# Patient Record
Sex: Female | Born: 1950 | Race: Black or African American | Hispanic: No | Smoking: Current every day smoker
Health system: Southern US, Community
[De-identification: ages and names within clinical notes are randomized; demographics above are authoritative.]

## PROBLEM LIST (undated history)

## (undated) DIAGNOSIS — I10 Essential (primary) hypertension: Secondary | ICD-10-CM

## (undated) DIAGNOSIS — M545 Low back pain, unspecified: Secondary | ICD-10-CM

## (undated) DIAGNOSIS — E785 Hyperlipidemia, unspecified: Secondary | ICD-10-CM

## (undated) DIAGNOSIS — R001 Bradycardia, unspecified: Secondary | ICD-10-CM

## (undated) DIAGNOSIS — C801 Malignant (primary) neoplasm, unspecified: Secondary | ICD-10-CM

## (undated) DIAGNOSIS — G8929 Other chronic pain: Secondary | ICD-10-CM

## (undated) DIAGNOSIS — K219 Gastro-esophageal reflux disease without esophagitis: Secondary | ICD-10-CM

## (undated) DIAGNOSIS — Z87440 Personal history of urinary (tract) infections: Secondary | ICD-10-CM

## (undated) DIAGNOSIS — M1990 Unspecified osteoarthritis, unspecified site: Secondary | ICD-10-CM

## (undated) DIAGNOSIS — E119 Type 2 diabetes mellitus without complications: Secondary | ICD-10-CM

## (undated) DIAGNOSIS — J449 Chronic obstructive pulmonary disease, unspecified: Secondary | ICD-10-CM

## (undated) DIAGNOSIS — R32 Unspecified urinary incontinence: Secondary | ICD-10-CM

## (undated) DIAGNOSIS — K759 Inflammatory liver disease, unspecified: Secondary | ICD-10-CM

## (undated) DIAGNOSIS — D649 Anemia, unspecified: Secondary | ICD-10-CM

## (undated) DIAGNOSIS — B019 Varicella without complication: Secondary | ICD-10-CM

## (undated) HISTORY — PX: TONSILLECTOMY: SUR1361

## (undated) HISTORY — DX: Chronic obstructive pulmonary disease, unspecified: J44.9

## (undated) HISTORY — DX: Personal history of urinary (tract) infections: Z87.440

## (undated) HISTORY — DX: Gastro-esophageal reflux disease without esophagitis: K21.9

## (undated) HISTORY — PX: APPENDECTOMY: SHX54

## (undated) HISTORY — DX: Anemia, unspecified: D64.9

## (undated) HISTORY — DX: Unspecified urinary incontinence: R32

## (undated) HISTORY — DX: Varicella without complication: B01.9

## (undated) HISTORY — DX: Type 2 diabetes mellitus without complications: E11.9

---
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
## 1982-04-20 HISTORY — PX: ABDOMINAL HYSTERECTOMY: SHX81

## 1994-04-20 HISTORY — PX: BREAST LUMPECTOMY: SHX2

## 1994-04-20 HISTORY — PX: BREAST BIOPSY: SHX20

## 1994-04-20 HISTORY — PX: MASTECTOMY: SHX3

## 2000-07-07 ENCOUNTER — Emergency Department (HOSPITAL_COMMUNITY): Admission: EM | Admit: 2000-07-07 | Discharge: 2000-07-07 | Payer: Self-pay | Admitting: Emergency Medicine

## 2001-05-19 ENCOUNTER — Emergency Department (HOSPITAL_COMMUNITY): Admission: EM | Admit: 2001-05-19 | Discharge: 2001-05-19 | Payer: Self-pay | Admitting: Emergency Medicine

## 2001-05-19 ENCOUNTER — Encounter: Payer: Self-pay | Admitting: Emergency Medicine

## 2002-06-25 ENCOUNTER — Encounter: Payer: Self-pay | Admitting: Emergency Medicine

## 2002-06-25 ENCOUNTER — Emergency Department (HOSPITAL_COMMUNITY): Admission: EM | Admit: 2002-06-25 | Discharge: 2002-06-26 | Payer: Self-pay | Admitting: *Deleted

## 2003-08-03 ENCOUNTER — Emergency Department (HOSPITAL_COMMUNITY): Admission: EM | Admit: 2003-08-03 | Discharge: 2003-08-04 | Payer: Self-pay

## 2005-12-08 ENCOUNTER — Emergency Department (HOSPITAL_COMMUNITY): Admission: EM | Admit: 2005-12-08 | Discharge: 2005-12-08 | Payer: Self-pay | Admitting: Emergency Medicine

## 2006-05-30 ENCOUNTER — Emergency Department (HOSPITAL_COMMUNITY): Admission: EM | Admit: 2006-05-30 | Discharge: 2006-05-30 | Payer: Self-pay | Admitting: Family Medicine

## 2008-02-23 ENCOUNTER — Ambulatory Visit: Payer: Self-pay | Admitting: Cardiology

## 2008-02-23 ENCOUNTER — Observation Stay (HOSPITAL_COMMUNITY): Admission: EM | Admit: 2008-02-23 | Discharge: 2008-02-24 | Payer: Self-pay | Admitting: Emergency Medicine

## 2008-02-24 ENCOUNTER — Encounter (INDEPENDENT_AMBULATORY_CARE_PROVIDER_SITE_OTHER): Payer: Self-pay | Admitting: Internal Medicine

## 2008-02-24 ENCOUNTER — Ambulatory Visit: Payer: Self-pay | Admitting: Internal Medicine

## 2008-03-21 ENCOUNTER — Encounter: Payer: Self-pay | Admitting: Internal Medicine

## 2008-03-21 ENCOUNTER — Ambulatory Visit: Payer: Self-pay | Admitting: Internal Medicine

## 2008-03-21 DIAGNOSIS — Z853 Personal history of malignant neoplasm of breast: Secondary | ICD-10-CM | POA: Insufficient documentation

## 2008-03-21 DIAGNOSIS — Z87898 Personal history of other specified conditions: Secondary | ICD-10-CM | POA: Insufficient documentation

## 2008-03-21 DIAGNOSIS — R079 Chest pain, unspecified: Secondary | ICD-10-CM

## 2008-10-03 ENCOUNTER — Emergency Department (HOSPITAL_COMMUNITY): Admission: EM | Admit: 2008-10-03 | Discharge: 2008-10-03 | Payer: Self-pay | Admitting: Family Medicine

## 2010-09-05 NOTE — Discharge Summary (Signed)
Cooper Cooper NO.:  [id number]   MEDICAL RECORD NO.:  [id number]          PATIENT TYPE:  INP   LOCATION:  2031                         FACILITY:  [facility]   PHYSICIAN:  Chauncey Reading, D.O.  DATE OF BIRTH:  [birth year]   DATE OF ADMISSION:  02/23/2008  DATE OF DISCHARGE:  02/24/2008                               DISCHARGE SUMMARY   DISCHARGE DIAGNOSES:  1. Cocaine-induced chest pain.  2. Chronic bradycardia.  3. History of breast cancer, in remission.  4. Urinary tract infection.   DISCHARGE MEDICATIONS:  Bactrim 160/800, take one tablet twice daily for  the next 2 days.   DISPOSITION AND FOLLOWUP:  Cooper Cooper is discharged in stable condition  with no residual chest pain.  She is to follow up with Dr. Onalee Hua in  the [facility] on March 21, 2008, at 2:30 p.m.  She  should have a repeat CT scan of her lungs in 6-12 months to follow up on  lesion, as seen in the CT described below.  She should have a mammogram  scheduled during her Outpatient Clinic appointment.   PROCEDURES:  Chest x-ray February 23, 2008, shows previous right  mastectomy.  No cardiopulmonary disease.  No acute process.  CT of the  chest February 24, 2008, shows a ground glass nodule in the left upper  lobe, which is nonspecific.  This patient is at high risk for  bronchogenic carcinoma, follow up chest CT 6-12 months as recommended.  The patient is at low risk for bronchogenic carcinoma, follow up chest  CT at 12 months is recommended.  There is no evidence for pathologic  lymphadenopathy.  The supraclavicular region is incompletely visualized  on the CT.  There is clinical evidence for adenopathy within soft tissue  of the neck and contrast enhanced CT of soft tissues of the neck should  be obtained.   February 24, 2008, 2-D echo shows overall left ventricular systolic  function to be normal, left ventricular ejection fraction is 65%.  There  are no regional wall  motion abnormalities.  Right ventricular size and  function is normal.   CONSULTATIONS:  None.   ADMITTING HISTORY AND PHYSICAL:  Cooper Cooper is a 60 year old female  with past medical history of breast cancer, status post right  mastectomy.  She is on chemoradiation.  She presents with 24 hours of  left-sided chest pain.  Chest pain started on the afternoon prior to  admission was acute and onset.  It started while she was sitting in  front of her computer.  Pain is described as pressure like on the left  side of the chest with radiation to the left side of the neck and left  arm.  There is no associated shortness of breath, diaphoresis, nausea,  or vomiting.  Pain has been constant since onset.  She has not taken  anything to make it better or worse.  This morning, when she woke upon,  the chest pain was still present.  She became concerned and presented to  the emergency department.  Cooper Cooper states that the pain is worse  with coughing and with deep inspiration.  She has had a mild cough for 2-  3 weeks, which is not productive of clear white sputum chest.  She also  admits to having had several members of her family sick with cold and  flu symptoms.  She denies fevers, chills, change in appetite or bowel  habits, no dysuria.  No leg swelling, no headache, or no  lightheadedness.   PHYSICAL EXAMINATION:  VITAL SIGNS:  Temperature is 98.1, blood pressure  is 158/90, pulse is 63, and oxygen saturation 97% on room air.  GENERAL:  The patient is sitting up in bed.  She seems mildly  uncomfortable.  EYES:  Sclerae nonicteric.  Pupils equal, round, and reactive.  EAR, NOSE, AND THROAT:  There is no pharyngeal edema.  No tonsillar  exudate.  Mucous membranes are moist.  NECK:  Supple.  No thyromegaly.  No JVD.  No bruits.  LUNGS:  Chest expand symmetrically.  There are bibasilar crackles.  There is no wheezing.  CARDIOVASCULAR:  The patient is status post right mastectomy.  Heart   sounds regular rate and rhythm.  No murmurs, rubs, or gallops.  CHEST:  There is no tenderness to palpation of the chest.  The right  mastectomy scar is clean and old.  There are no lumps on the right  chest.  Left breast exam is normal.  There is no axillary or brachial  lymphadenopathy.  ABDOMEN:  Normal bowel sounds.  Soft, nontender, and nondistended.  EXTREMITIES:  No edema and 2+ pulses.  SKIN:  No rashes, but she does have 2 healed boils, one on the left  cheek directly below the eye and one on the right abdomen.  NEUROLOGIC:  Nonfocal.   LABORATORIES:  Sodium 142, potassium 4.1, chloride 109, bicarb 26, BUN  15, creatinine 1.2, and glucose 96.  Point-of-care markers negative x3.  Cardiac enzymes negative.   HOSPITAL COURSE:  1. Chest pain.  Initially, the differential diagnosis was broad.  The      patient was ruled out for acute coronary syndrome with negative      cardiac enzymes and an EKG, which showed no significant ST change.      The patient was started on aspirin, oxygen, and morphine.  Fasting      lipid panel was checked.  LDL cholesterol was within normal limits.      Chest x-ray showed no evidence of pneumonia.  The possibility of      pericarditis was explored.  Description of pain was not typical for      this diagnosis.  ESR and CRP were negative.  Eventually, urine-drug      screen returned positive for cocaine and chest pain was determined      to be likely secondary to cocaine-induced vasospasm.  Chest pain      resolved within 24 hours of admission and the patient was      discharged without residual pain.  2. Bradycardia.  Initially, the patient presented with heart rate of      49-62 in looking at previous EKGs, this is not new for Ms. Cham      and seems to be nonpathologic.  She is not fatigued or dizzy.  She      has no reported episodes of syncope.  She was alerted that if such      symptoms do develop, she should follow up with a primary care       physician to be  referred to cardiologist for a possible pacer      placement.  3. History of breast cancer.  On intake exam it was noted that Ms.      Ahn had left supraclavicular tenderness and a possible enlarged      lymph node in that area which is most likely reactive, given her      history of upper respiratory infection within the past couple of      weeks, however, a CT chest was done to explore this possibility.  A      CT chest was negative for lymphadenopathy.  However, the      supraclavicular area was not very well visualized and a ground-      glass opacity was seen.  Ms. Devoto reports that she had a right-      sided mastectomy with no follow up, no radiation, or chemotherapy      and no further visits with oncologist, this should be explored      during her outpatient appointment and she should have a repeat CT      within the next 59-months.  4. Urinary tract infection.  On admission, Ms. Zarazua did report some      dysuria.  She had had some suprapubic abdominal tenderness.  Her UA      showed large leukocyte esterase.  She was treated with Bactrim for      a noncomplicated urinary tract infection.   On the day of discharge; temperature 98.4, blood pressure 136/89, pulse  71, and oxygen saturation 99% on room air.  Sodium 148, potassium 4.0,  chloride 109, bicarb 25, BUN 7, and creatinine 1.6, and glucose 96.  White blood cells 9.6, hemoglobin 12.3, hematocrit 38.7, and platelets  262.      Elby Showers, MD  Electronically Signed      Chauncey Reading, D.O.  Electronically Signed    CW/MEDQ  D:  02/27/2008  T:  02/28/2008  Job:  [id number]

## 2011-01-20 LAB — DIFFERENTIAL
Basophils Relative: 1
Basophils Relative: 1
Eosinophils Absolute: 0.3
Eosinophils Relative: 3
Lymphocytes Relative: 40
Lymphs Abs: 3.2
Monocytes Relative: 7
Monocytes Relative: 9
Neutro Abs: 4.8
Neutrophils Relative %: 47

## 2011-01-20 LAB — CBC
HCT: 38.7
MCHC: 31.6
MCV: 73 — ABNORMAL LOW
Platelets: 253
Platelets: 262
RDW: 14.9
RDW: 15.4
WBC: 9.6

## 2011-01-20 LAB — COMPREHENSIVE METABOLIC PANEL
Alkaline Phosphatase: 85
BUN: 13
Calcium: 9.4
Creatinine, Ser: 1.02
Glucose, Bld: 66 — ABNORMAL LOW
Potassium: 4.1
Total Protein: 7.3

## 2011-01-20 LAB — SEDIMENTATION RATE: Sed Rate: 20

## 2011-01-20 LAB — BASIC METABOLIC PANEL
BUN: 7
Chloride: 109
Glucose, Bld: 96
Potassium: 4

## 2011-01-20 LAB — LIPID PANEL
Total CHOL/HDL Ratio: 3
VLDL: 23

## 2011-01-20 LAB — URINALYSIS, ROUTINE W REFLEX MICROSCOPIC
Nitrite: POSITIVE — AB
Specific Gravity, Urine: 1.024
pH: 5.5

## 2011-01-20 LAB — POCT CARDIAC MARKERS
CKMB, poc: 1.7
CKMB, poc: <1 — ABNORMAL LOW
Myoglobin, poc: 45
Myoglobin, poc: 88.2
Troponin i, poc: <0.05

## 2011-01-20 LAB — B-NATRIURETIC PEPTIDE (CONVERTED LAB): Pro B Natriuretic peptide (BNP): 28

## 2011-01-20 LAB — CARDIAC PANEL(CRET KIN+CKTOT+MB+TROPI)
CK, MB: 0.8
Total CK: 80
Troponin I: <0.01

## 2011-01-20 LAB — HEMOGLOBIN A1C: Hgb A1c MFr Bld: 6.4 — ABNORMAL HIGH

## 2011-01-20 LAB — URINE MICROSCOPIC-ADD ON

## 2011-01-20 LAB — POCT I-STAT, CHEM 8
BUN: 15
Calcium, Ion: 1.2
Chloride: 109
Glucose, Bld: 96
TCO2: 26

## 2011-01-20 LAB — RAPID URINE DRUG SCREEN, HOSP PERFORMED: Tetrahydrocannabinol: NOT DETECTED

## 2011-01-20 LAB — PROTIME-INR: INR: 1

## 2011-01-20 LAB — CK TOTAL AND CKMB (NOT AT ARMC)
CK, MB: 1.2
Total CK: 118

## 2012-05-25 ENCOUNTER — Emergency Department (HOSPITAL_COMMUNITY)
Admission: EM | Admit: 2012-05-25 | Discharge: 2012-05-25 | Disposition: A | Discharge status: Home / Self Care | Payer: Self-pay | Attending: Emergency Medicine | Admitting: Emergency Medicine

## 2012-05-25 ENCOUNTER — Encounter (HOSPITAL_COMMUNITY): Payer: Self-pay | Admitting: *Deleted

## 2012-05-25 ENCOUNTER — Emergency Department (HOSPITAL_COMMUNITY): Payer: Self-pay

## 2012-05-25 DIAGNOSIS — J00 Acute nasopharyngitis [common cold]: Secondary | ICD-10-CM | POA: Insufficient documentation

## 2012-05-25 DIAGNOSIS — Z853 Personal history of malignant neoplasm of breast: Secondary | ICD-10-CM | POA: Insufficient documentation

## 2012-05-25 DIAGNOSIS — R071 Chest pain on breathing: Secondary | ICD-10-CM | POA: Insufficient documentation

## 2012-05-25 DIAGNOSIS — R059 Cough, unspecified: Secondary | ICD-10-CM | POA: Insufficient documentation

## 2012-05-25 DIAGNOSIS — R05 Cough: Secondary | ICD-10-CM | POA: Insufficient documentation

## 2012-05-25 DIAGNOSIS — Z9010 Acquired absence of unspecified breast and nipple: Secondary | ICD-10-CM | POA: Insufficient documentation

## 2012-05-25 DIAGNOSIS — Z87891 Personal history of nicotine dependence: Secondary | ICD-10-CM | POA: Insufficient documentation

## 2012-05-25 DIAGNOSIS — R0789 Other chest pain: Secondary | ICD-10-CM

## 2012-05-25 HISTORY — DX: Malignant (primary) neoplasm, unspecified: C80.1

## 2012-05-25 LAB — COMPREHENSIVE METABOLIC PANEL
Albumin: 3.1 g/dL — ABNORMAL LOW (ref 3.5–5.2)
Alkaline Phosphatase: 89 U/L (ref 39–117)
BUN: 7 mg/dL (ref 6–23)
CO2: 26 mEq/L (ref 19–32)
Chloride: 104 mEq/L (ref 96–112)
Potassium: 4.1 mEq/L (ref 3.5–5.1)
Total Bilirubin: 0.2 mg/dL — ABNORMAL LOW (ref 0.3–1.2)

## 2012-05-25 LAB — POCT I-STAT TROPONIN I: Troponin i, poc: 0 ng/mL (ref 0.00–0.08)

## 2012-05-25 LAB — CBC WITH DIFFERENTIAL/PLATELET
Basophils Relative: 1 % (ref 0–1)
Hemoglobin: 12.9 g/dL (ref 12.0–15.0)
Lymphocytes Relative: 50 % — ABNORMAL HIGH (ref 12–46)
Lymphs Abs: 3.6 10*3/uL (ref 0.7–4.0)
MCHC: 32.5 g/dL (ref 30.0–36.0)
Monocytes Relative: 11 % (ref 3–12)
Neutro Abs: 2.2 10*3/uL (ref 1.7–7.7)
Neutrophils Relative %: 30 % — ABNORMAL LOW (ref 43–77)
RBC: 5.58 MIL/uL — ABNORMAL HIGH (ref 3.87–5.11)
WBC: 7.3 10*3/uL (ref 4.0–10.5)

## 2012-05-25 MED ORDER — MORPHINE SULFATE 4 MG/ML IJ SOLN
4.0000 mg | Freq: Once | INTRAMUSCULAR | Status: AC
  Administered 2012-05-25: 4 mg via INTRAVENOUS
  Filled 2012-05-25: qty 1

## 2012-05-25 MED ORDER — HYDROCODONE-ACETAMINOPHEN 5-325 MG PO TABS: 1.0000 | ORAL_TABLET | Freq: Four times a day (QID) | ORAL | Status: DC | PRN

## 2012-05-25 MED ORDER — KETOROLAC TROMETHAMINE 30 MG/ML IJ SOLN
30.0000 mg | Freq: Once | INTRAMUSCULAR | Status: AC
  Administered 2012-05-25: 30 mg via INTRAVENOUS
  Filled 2012-05-25: qty 1

## 2012-05-25 MED ORDER — IBUPROFEN 800 MG PO TABS: 800.0000 mg | ORAL_TABLET | Freq: Three times a day (TID) | ORAL | Status: DC | PRN

## 2012-05-25 NOTE — ED Notes (Signed)
Pt d/c'd from IV, monitor, continuous pulse oximetry and blood pressure cuff; pt getting dressed to be discharged home; family at bedside 

## 2012-05-25 NOTE — ED Notes (Signed)
Pt is here with right sided chest pain for 4 days and with sob.  Pt has right side mastectomy from previous breast cancer

## 2012-05-25 NOTE — ED Notes (Signed)
Pt placed back on monitor, continuous pulse oximetry and blood pressure cuff; family at bedside 

## 2012-05-25 NOTE — ED Provider Notes (Signed)
History     CSN: [id number]  Arrival date & time 05/25/12  1345   First MD Initiated Contact with Patient 05/25/12 [phone]      Chief Complaint  Patient presents with  . Chest Pain    (Consider location/radiation/quality/duration/timing/severity/associated sxs/prior treatment) Patient is a 62 y.o. female presenting with chest pain.  Chest Pain    Patient is a 62 year old female who presents today with chest pain.  It has been going on for four days and initially noticed it when she was playing dolls with her grand daughter.  She describes the pain as between dull and sharp and located on her  R breast around her incision site from a mastectomy in 1996.  The pain is constant and is worsened by laying down, sitting up and deep breaths.  Tylenol has helped control the pain. Patient has run out of Tylenol and can't afford anymore. Patient denies any fever,abdominal pain, vomiting, nausea,weakness, headache, back pain, dizziness, syncope, shortness of breath, pain located anywhere else and diaphoresis.  Does complain of a cough but is currently getting over a cold.  Denies any previous medical problems and does not follow up with a PCP.  [facility] her blood pressure checked.  It was elevated at 220/112 but states that it's usually normal.    Past Medical History  Diagnosis Date  . Cancer     breast    Past Surgical History  Procedure Date  . Mastectomy   . Abdominal hysterectomy     No family history on file.  History  Substance Use Topics  . Smoking status: Former Games developer  . Smokeless tobacco: Not on file  . Alcohol Use: No    OB History    Grav Para Term Preterm Abortions TAB SAB Ect Mult Living                  Review of Systems  Cardiovascular: Positive for chest pain.   All other systems negative except as documented in the HPI. All pertinent positives and negatives as reviewed in the HPI. Allergies  Codeine  Home Medications    Current Outpatient Rx  Name  Route  Sig  Dispense  Refill  . ACETAMINOPHEN 500 MG PO TABS   Oral   Take 1,500 mg by mouth at bedtime as needed. For pain         . IBUPROFEN 200 MG PO TABS   Oral   Take 400 mg by mouth daily as needed. For pain           BP 204/111  Pulse 67  Temp 98.1 F (36.7 C) (Oral)  Resp 18  SpO2 98%  Physical Exam  Nursing note and vitals reviewed. Constitutional: She is oriented to person, place, and time. She appears well-developed and well-nourished.  HENT:  Head: Normocephalic and atraumatic.  Mouth/Throat: Oropharynx is clear and moist.  Eyes: Pupils are equal, round, and reactive to light.  Neck: Normal range of motion. Neck supple.  Cardiovascular: Normal rate, regular rhythm and normal heart sounds.  Exam reveals no gallop and no friction rub.   No murmur heard. Pulmonary/Chest: Effort normal and breath sounds normal. No respiratory distress. She exhibits tenderness.    Abdominal: Soft. Bowel sounds are normal. She exhibits no distension and no mass. There is no tenderness. There is no guarding.  Neurological: She is alert and oriented to person, place, and time.  Skin: Skin is warm and  dry. No rash noted.     ED Course  Procedures (including critical care time)  4:20 pm Patient has received morphine  4:35pm The pain has gotten better   Labs Reviewed  CBC WITH DIFFERENTIAL  COMPREHENSIVE METABOLIC PANEL   Dg Chest 2 View  05/25/2012  *RADIOLOGY REPORT*  Clinical Data: Chest pain  CHEST - 2 VIEW  Comparison: 10/03/2008  Findings: The lungs are clear without focal infiltrate, edema, pneumothorax or pleural effusion. Hyperexpansion is consistent with emphysema. Cardiopericardial silhouette is at upper limits of normal for size. Right mastectomy with surgical clips in the right axilla.  Imaged bony structures of the thorax are intact.  IMPRESSION: Stable.  No acute cardiopulmonary findings.   Original Report Authenticated By: Kennith Center, M.D.      Patient is advised of the expected plan here in the ER. The patient is advised to alert [place] to any of her needs. All current questions were addressed.  The patient has been seen by the attending Physician.  MDM   Date: 05/25/2012  Rate: 58  Rhythm: sinus bradycardia  QRS Axis: normal  Intervals: normal  ST/T Wave abnormalities: normal  Conduction Disutrbances:none  Narrative Interpretation:   Old EKG Reviewed: none available  MDM Reviewed: nursing note and vitals Interpretation: labs, ECG and x-ray   At this time this pain seems consistent with chest wall pain based on her HPI and PE. The pain has been constant without relief other than Tylenol. The patient does not have any signs that would be consistent with PE.          Carlyle Dolly, PA-C 05/26/12 [phone]

## 2012-05-25 NOTE — ED Notes (Signed)
Pt up ambulatory to the bathroom at this time; family at bedside

## 2012-05-26 NOTE — ED Provider Notes (Signed)
Medical screening examination/treatment/procedure(s) were conducted as a shared visit with non-physician practitioner(s) and myself.  I personally evaluated the patient during the encounter.  Patient with chest pain that is c/w chest wall pain. Patient examined and did have very reproducible right sided pain. Extremely low risk for cardiac chest pain and PE.  Gilda Crease, MD 05/26/12 1115

## 2013-09-18 ENCOUNTER — Emergency Department (HOSPITAL_COMMUNITY)
Admission: EM | Admit: 2013-09-18 | Discharge: 2013-09-18 | Disposition: A | Discharge status: Home / Self Care | Payer: Self-pay | Attending: Emergency Medicine | Admitting: Emergency Medicine

## 2013-09-18 ENCOUNTER — Encounter (HOSPITAL_COMMUNITY): Payer: Self-pay | Admitting: Emergency Medicine

## 2013-09-18 ENCOUNTER — Emergency Department (HOSPITAL_COMMUNITY): Payer: Self-pay

## 2013-09-18 DIAGNOSIS — Z853 Personal history of malignant neoplasm of breast: Secondary | ICD-10-CM | POA: Insufficient documentation

## 2013-09-18 DIAGNOSIS — Z792 Long term (current) use of antibiotics: Secondary | ICD-10-CM | POA: Insufficient documentation

## 2013-09-18 DIAGNOSIS — Z87891 Personal history of nicotine dependence: Secondary | ICD-10-CM | POA: Insufficient documentation

## 2013-09-18 DIAGNOSIS — N390 Urinary tract infection, site not specified: Secondary | ICD-10-CM | POA: Insufficient documentation

## 2013-09-18 DIAGNOSIS — R42 Dizziness and giddiness: Secondary | ICD-10-CM | POA: Insufficient documentation

## 2013-09-18 DIAGNOSIS — R112 Nausea with vomiting, unspecified: Secondary | ICD-10-CM | POA: Insufficient documentation

## 2013-09-18 LAB — CBC WITH DIFFERENTIAL/PLATELET
BASOS ABS: 0 10*3/uL (ref 0.0–0.1)
Basophils Relative: 0 % (ref 0–1)
EOS PCT: 2 % (ref 0–5)
Eosinophils Absolute: 0.2 10*3/uL (ref 0.0–0.7)
HCT: 38.7 % (ref 36.0–46.0)
Hemoglobin: 12.5 g/dL (ref 12.0–15.0)
LYMPHS ABS: 2.6 10*3/uL (ref 0.7–4.0)
LYMPHS PCT: 25 % (ref 12–46)
MCH: 21.9 pg — ABNORMAL LOW (ref 26.0–34.0)
MCHC: 32.3 g/dL (ref 30.0–36.0)
MCV: 67.7 fL — AB (ref 78.0–100.0)
MONOS PCT: 6 % (ref 3–12)
Monocytes Absolute: 0.6 10*3/uL (ref 0.1–1.0)
Neutro Abs: 6.8 10*3/uL (ref 1.7–7.7)
Neutrophils Relative %: 67 % (ref 43–77)
PLATELETS: 273 10*3/uL (ref 150–400)
RBC: 5.72 MIL/uL — AB (ref 3.87–5.11)
RDW: 16.5 % — AB (ref 11.5–15.5)
WBC: 10.2 10*3/uL (ref 4.0–10.5)

## 2013-09-18 LAB — COMPREHENSIVE METABOLIC PANEL
ALBUMIN: 3.6 g/dL (ref 3.5–5.2)
ALK PHOS: 81 U/L (ref 39–117)
ALT: 8 U/L (ref 0–35)
AST: 16 U/L (ref 0–37)
BILIRUBIN TOTAL: 0.4 mg/dL (ref 0.3–1.2)
BUN: 15 mg/dL (ref 6–23)
CHLORIDE: 106 meq/L (ref 96–112)
CO2: 24 mEq/L (ref 19–32)
Calcium: 10 mg/dL (ref 8.4–10.5)
Creatinine, Ser: 1.02 mg/dL (ref 0.50–1.10)
GFR calc Af Amer: 66 mL/min — ABNORMAL LOW (ref >90)
GFR calc non Af Amer: 57 mL/min — ABNORMAL LOW (ref >90)
Glucose, Bld: 99 mg/dL (ref 70–99)
POTASSIUM: 4.6 meq/L (ref 3.7–5.3)
SODIUM: 142 meq/L (ref 137–147)
TOTAL PROTEIN: 8.2 g/dL (ref 6.0–8.3)

## 2013-09-18 LAB — URINALYSIS, ROUTINE W REFLEX MICROSCOPIC
Bilirubin Urine: NEGATIVE
Glucose, UA: NEGATIVE mg/dL
Ketones, ur: NEGATIVE mg/dL
NITRITE: POSITIVE — AB
PH: 5 (ref 5.0–8.0)
Protein, ur: NEGATIVE mg/dL
SPECIFIC GRAVITY, URINE: 1.019 (ref 1.005–1.030)
UROBILINOGEN UA: 0.2 mg/dL (ref 0.0–1.0)

## 2013-09-18 LAB — URINE MICROSCOPIC-ADD ON

## 2013-09-18 LAB — LIPASE, BLOOD: LIPASE: 34 U/L (ref 11–59)

## 2013-09-18 LAB — POC OCCULT BLOOD, ED: Fecal Occult Bld: NEGATIVE

## 2013-09-18 MED ORDER — SODIUM CHLORIDE 0.9 % IV BOLUS (SEPSIS)
1000.0000 mL | Freq: Once | INTRAVENOUS | Status: AC
  Administered 2013-09-18: 1000 mL via INTRAVENOUS

## 2013-09-18 MED ORDER — DEXTROSE 5 % IV SOLN
1.0000 g | Freq: Once | INTRAVENOUS | Status: AC
  Administered 2013-09-18: 1 g via INTRAVENOUS
  Filled 2013-09-18: qty 10

## 2013-09-18 MED ORDER — CEPHALEXIN 500 MG PO CAPS: 500.0000 mg | ORAL_CAPSULE | Freq: Three times a day (TID) | ORAL | Status: DC

## 2013-09-18 MED ORDER — ONDANSETRON HCL 4 MG PO TABS: 4.0000 mg | ORAL_TABLET | Freq: Three times a day (TID) | ORAL | Status: DC | PRN

## 2013-09-18 MED ORDER — ONDANSETRON HCL 4 MG/2ML IJ SOLN
4.0000 mg | Freq: Once | INTRAMUSCULAR | Status: AC
  Administered 2013-09-18: 4 mg via INTRAVENOUS
  Filled 2013-09-18: qty 2

## 2013-09-18 NOTE — ED Provider Notes (Signed)
CSN: [id number]     Arrival date & time 09/18/13  1650 History   First MD Initiated Contact with Patient 09/18/13 1806     Chief Complaint  Patient presents with  . Hematemesis     (Consider location/radiation/quality/duration/timing/severity/associated sxs/prior Treatment) HPI Patient reports about 10:30 this morning she started feeling dizzy which means lightheaded. She started vomiting about 11:30 AM. She reports first 2 emesis were without blood. She reports after that she saw some blood with each emesis. She states the total amount of blood was a teaspoon of blood. She states the blood was bright red. She reports mild nausea now. She denies any diarrhea or abdominal pain. Nobody else has been ill. She does report she took some ibuprofen for 3-4 days about 2 weeks ago but no other anti-inflammatory medications. She was only taking about 2 tablets a day.  Patient states she had breast cancer and had a mastectomy in 1996. She's concerned because she noted a nontender knot on her back about 3 weeks ago. She denies any numbness or tingling of her extremities.  PCP [facility], first appointment July 9  Past Medical History  Diagnosis Date  . Cancer     breast   Past Surgical History  Procedure Laterality Date  . Mastectomy    . Abdominal hysterectomy     No family history on file. History  Substance Use Topics  . Smoking status: Former Research scientist (life sciences)  . Smokeless tobacco: Not on file  . 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     Allergies  Codeine  Home Medications   Prior to Admission medications   Medication Sig Start Date End Date Taking? Authorizing Provider  ibuprofen (ADVIL,MOTRIN) 200 MG tablet Take 400 mg by mouth daily as needed. For pain   Yes Historical Provider, MD  cephALEXin (KEFLEX) 500 MG  capsule Take 1 capsule (500 mg total) by mouth 3 (three) times daily. 09/18/13   Janice Norrie, MD  ondansetron (ZOFRAN) 4 MG tablet Take 1 tablet (4 mg total) by mouth every 8 (eight) hours as needed for nausea or vomiting. 09/18/13   Janice Norrie, MD   BP 186/107  Pulse 69  Temp(Src) 98.2 F (36.8 C) (Oral)  Resp 18  SpO2 99%  Vital signs normal   Physical Exam  Nursing note and vitals reviewed. Constitutional: She is oriented to person, place, and time. She appears well-developed and well-nourished.  Non-toxic appearance. She does not appear ill. No distress.  HENT:  Head: Normocephalic and atraumatic.  Right Ear: External ear normal.  Left Ear: External ear normal.  Nose: Nose normal. No mucosal edema or rhinorrhea.  Mouth/Throat: Oropharynx is clear and moist and mucous membranes are normal. No dental abscesses or uvula swelling.  Eyes: Conjunctivae and EOM are normal. Pupils are equal, round, and reactive to light.  Neck: Normal range of motion and full passive range of motion without pain. Neck supple.  Cardiovascular: Normal rate, regular rhythm and normal heart sounds.  Exam reveals no gallop and no friction rub.   No murmur heard. Pulmonary/Chest: Effort normal and breath sounds normal. No respiratory distress. She has no wheezes. She has no rhonchi. She has no rales. She exhibits no tenderness  and no crepitus.  Abdominal: Soft. Normal appearance and bowel sounds are normal. She exhibits no distension. There is no tenderness. There is no rebound and no guarding.  Musculoskeletal: Normal range of motion. She exhibits no edema and no tenderness.  Moves all extremities well.   Neurological: She is alert and oriented to person, place, and time. She has normal strength. No cranial nerve deficit.  Skin: Skin is warm, dry and intact. No rash noted. No erythema. No pallor.  Psychiatric: She has a normal mood and affect. Her speech is normal and behavior is normal. Her mood appears not  anxious.    ED Course  Procedures (including critical care time)  Medications  ondansetron (ZOFRAN) injection 4 mg (4 mg Intravenous Given 09/18/13 1817)  sodium chloride 0.9 % bolus 1,000 mL (0 mLs Intravenous Stopped 09/18/13 2043)  cefTRIAXone (ROCEPHIN) 1 g in dextrose 5 % 50 mL IVPB (0 g Intravenous Stopped 09/18/13 2154)  sodium chloride 0.9 % bolus 1,000 mL (0 mLs Intravenous Stopped 09/18/13 2230)   The patient was given IV fluids and IV antibiotics for her UTI.  21:15 patient states her nausea is gone. She still has some mild dizziness when she sits up. She wants to try drinking oral fluids.  Patient had no further episodes of vomiting during her ED visit. She describes streaks of blood in her stool was Hemoccult negative. I do not suspect significant amount of bleeding. She was on nonsteroidals however was over a week ago. She has no abdominal pain. Do not feel like she needs to be on any antiacid medication regimen.  Patient has this area on her back that is long and lies along the left paraspinous region of her lumbar spine. It feels soft and fluctuant like a cyst. It does not feel like a lipoma although it could be. It is nonpainful. Patient is concerned because her history of breast cancer. Her x-ray and CT scan today do not show any bony abnormality. She will be referred to surgery for possible biopsy or if they feel it's a lipoma or even a cyst they may just elect to monitor it. [facility] July.  Patient is ready for discharge.  Labs Review Results for orders placed during the hospital encounter of 09/18/13  CBC WITH DIFFERENTIAL      Result Value Ref Range   WBC 10.2  4.0 - 10.5 K/uL   RBC 5.72 (*) 3.87 - 5.11 MIL/uL   Hemoglobin 12.5  12.0 - 15.0 g/dL   HCT 38.7  36.0 - 46.0 %   MCV 67.7 (*) 78.0 - 100.0 fL   MCH 21.9 (*) 26.0 - 34.0 pg   MCHC 32.3  30.0 - 36.0 g/dL   RDW 16.5 (*) 11.5 - 15.5 %   Platelets 273  150 - 400  K/uL   Neutrophils Relative % 67  43 - 77 %   Lymphocytes Relative 25  12 - 46 %   Monocytes Relative 6  3 - 12 %   Eosinophils Relative 2  0 - 5 %   Basophils Relative 0  0 - 1 %   Neutro Abs 6.8  1.7 - 7.7 K/uL   Lymphs Abs 2.6  0.7 - 4.0 K/uL   Monocytes Absolute 0.6  0.1 - 1.0 K/uL   Eosinophils Absolute 0.2  0.0 - 0.7 K/uL   Basophils Absolute 0.0  0.0 - 0.1 K/uL   Smear Review MORPHOLOGY UNREMARKABLE  URINALYSIS, ROUTINE W REFLEX MICROSCOPIC      Result Value Ref Range   Color, Urine YELLOW  YELLOW   APPearance CLOUDY (*) CLEAR   Specific Gravity, Urine 1.019  1.005 - 1.030   pH 5.0  5.0 - 8.0   Glucose, UA NEGATIVE  NEGATIVE mg/dL   Hgb urine dipstick TRACE (*) NEGATIVE   Bilirubin Urine NEGATIVE  NEGATIVE   Ketones, ur NEGATIVE  NEGATIVE mg/dL   Protein, ur NEGATIVE  NEGATIVE mg/dL   Urobilinogen, UA 0.2  0.0 - 1.0 mg/dL   Nitrite POSITIVE (*) NEGATIVE   Leukocytes, UA MODERATE (*) NEGATIVE  COMPREHENSIVE METABOLIC PANEL      Result Value Ref Range   Sodium 142  137 - 147 mEq/L   Potassium 4.6  3.7 - 5.3 mEq/L   Chloride 106  96 - 112 mEq/L   CO2 24  19 - 32 mEq/L   Glucose, Bld 99  70 - 99 mg/dL   BUN 15  6 - 23 mg/dL   Creatinine, Ser 1.02  0.50 - 1.10 mg/dL   Calcium 10.0  8.4 - 10.5 mg/dL   Total Protein 8.2  6.0 - 8.3 g/dL   Albumin 3.6  3.5 - 5.2 g/dL   AST 16  0 - 37 U/L   ALT 8  0 - 35 U/L   Alkaline Phosphatase 81  39 - 117 U/L   Total Bilirubin 0.4  0.3 - 1.2 mg/dL   GFR calc non Af Amer 57 (*) >90 mL/min   GFR calc Af Amer 66 (*) >90 mL/min  LIPASE, BLOOD      Result Value Ref Range   Lipase 34  11 - 59 U/L  URINE MICROSCOPIC-ADD ON      Result Value Ref Range   Squamous Epithelial / LPF RARE  RARE   WBC, UA 11-20  <3 WBC/hpf   RBC / HPF 0-2  <3 RBC/hpf   Bacteria, UA MANY (*) RARE  POC OCCULT BLOOD, ED      Result Value Ref Range   Fecal Occult Bld NEGATIVE  NEGATIVE    Laboratory interpretation all normal except UTI   Imaging  Review Dg Lumbar Spine Complete  09/18/2013   CLINICAL DATA:  Low back pain.  History of breast carcinoma.  EXAM: LUMBAR SPINE - COMPLETE 4+ VIEW  COMPARISON:  None.  FINDINGS: No fracture.  No spondylolisthesis.  There is a transitional lumbosacral vertebrae designated L5.  There is mild to moderate loss of disk height at L1-L2 with associated endplate spurring. Minor loss of disk height is noted at L2-L3. L3-L4 disc is well preserved in height. There is moderate loss of disk height at L4-L5.  Facet joints are relatively well preserved.  Bones are diffusely demineralized.  There are calcifications along the lower abdominal aorta. The soft tissues are otherwise unremarkable.  There are no osteoblastic or osteolytic lesions.  IMPRESSION: 1. No fracture or acute finding. No osteoblastic or osteolytic lesions. 2. Degenerative changes as described.   Electronically Signed   By: Lajean Manes M.D.   On: 09/18/2013 20:44   Ct Lumbar Spine Wo Contrast  09/18/2013   CLINICAL DATA:  History breast cancer. Cystic mass on the right side at L1-2.  EXAM: CT LUMBAR SPINE WITHOUT CONTRAST  TECHNIQUE: Multidetector CT imaging of the lumbar spine was performed without intravenous contrast administration. Multiplanar CT image reconstructions were also generated.  COMPARISON:  None.  FINDINGS: To optimize imaging of the lumbar spine, the  entire lumbar soft tissues were not included in the field-of-view. At the level of L2-L4 however, the majority of the soft tissues are visible. There is no visible soft tissue mass to explain the clinical history. There is no sclerotic or lytic lesion concerning for metastatic disease.  Osteopenia.  No acute fracture or traumatic malalignment.  Ill-defined low-density area in the posterior right lower pole is most likely a cyst. Exophytic lesion from the upper pole right kidney is partly imaged, but correlates with a cyst seen on chest CT 02/24/2008.  Degenerative changes:  T12- L1: Degenerative  disc narrowing and endplate spondylotic spurring. No evidence of significant stenosis.  L1-L2: Slight retrolisthesis. There is degenerative disc narrowing and spondylotic spurring. No evidence of significant canal or foraminal stenosis.  L2-L3: Spondylotic changes to the endplates and bulging of the disc which is mild. No evidence of significant canal or foraminal stenosis.  L3-L4: Spondylotic endplate changes. There is circumferential bulging of the disc which effaces the inferior foramina but does not causes advanced canal or foraminal stenosis.  L4-L5: Mild retrolisthesis. There is facet osteoarthritis with sclerosis and mild bony overgrowth. Disc bulging without evidence of significant canal or foraminal stenosis.  L5-S1:Mild degenerative disc narrowing. No evidence of significant stenosis.  IMPRESSION: 1. No lumbar spinal or perispinal abnormality to explain the palpable mass. 2. Lumbar spondylosis without significant canal or foraminal stenosis.   Electronically Signed   By: Jorje Guild M.D.   On: 09/18/2013 21:58     EKG Interpretation   Date/Time:  Monday September 18 2013 17:49:26 EDT Ventricular Rate:  49 PR Interval:  143 QRS Duration: 85 QT Interval:  497 QTC Calculation: 449 R Axis:   67 Text Interpretation:  Sinus bradycardia Moderate voltage criteria for LVH,  may be normal variant No significant change since last tracing  25 May 2012 Confirmed by [facility]  MD-I, Aradhya Shellenbarger ([id number]) on 09/18/2013 10:06:32 PM      MDM   Final diagnoses:  Nausea and vomiting  UTI (urinary tract infection)  cyst on back  Discharge Medication List as of 09/18/2013 11:28 PM    START taking these medications   Details  cephALEXin (KEFLEX) 500 MG capsule Take 1 capsule (500 mg total) by mouth 3 (three) times daily., Starting 09/18/2013, Until Discontinued, Print    ondansetron (ZOFRAN) 4 MG tablet Take 1 tablet (4 mg total) by mouth every 8 (eight) hours as needed for nausea or vomiting., Starting 09/18/2013,  Until Discontinued, Print        Plan discharge  Discharge Medication List as of 09/18/2013 11:28 PM    START taking these medications   Details  cephALEXin (KEFLEX) 500 MG capsule Take 1 capsule (500 mg total) by mouth 3 (three) times daily., Starting 09/18/2013, Until Discontinued, Print    ondansetron (ZOFRAN) 4 MG tablet Take 1 tablet (4 mg total) by mouth every 8 (eight) hours as needed for nausea or vomiting., Starting 09/18/2013, Until Discontinued, Print        Plan discharge  Rolland Porter, MD, Alanson Aly, MD 09/19/13 0001

## 2013-09-18 NOTE — ED Notes (Addendum)
Pt reports nausea and vomiting since 11am and has had 4 episodes of "large" amounts of vomit that had "blood and yellow stuff" in it. Pt says her head hurts at this time and that she feels dizzy and weak. Denies diarrhea or abdominal pain. Pt BP elevated in triage 186/107.

## 2013-09-18 NOTE — ED Notes (Signed)
Pt reports hx of hypertension, has not taken meds since December

## 2013-09-18 NOTE — Discharge Instructions (Signed)
Drink plenty of fluids. Use the zofran for nausea or vomiting. Take the antibiotics until gone. Return to the ED if you continue to have vomiting, you get dehydrated, you get a fever or you feel worse. You can have that cystic like area on your back rechecked by Dr Marlou Starks, the surgeon on call. Keep your appointment at the [facility] and [facility].

## 2013-09-20 LAB — URINE CULTURE
Colony Count: >=100000
SPECIAL REQUESTS: NORMAL

## 2013-09-21 ENCOUNTER — Telehealth (HOSPITAL_BASED_OUTPATIENT_CLINIC_OR_DEPARTMENT_OTHER): Payer: Self-pay | Admitting: Emergency Medicine

## 2013-09-21 NOTE — Telephone Encounter (Signed)
Post ED Visit - Positive Culture Follow-up  Culture report reviewed by antimicrobial stewardship pharmacist: []  Wes Dulaney, Pharm.D., BCPS []  Heide Guile, Pharm.D., BCPS [x]  Alycia Rossetti, Pharm.D., BCPS []  [place], Pharm.D., BCPS, AAHIVP []  Legrand Como, Pharm.D., BCPS, AAHIVP []  Juliene Pina, Pharm.D.  Positive urine culture Treated with Keflex, organism sensitive to the same and no further patient follow-up is required at this time.  Deshone Lyssy 09/21/2013, 1:33 PM

## 2014-09-24 ENCOUNTER — Emergency Department (HOSPITAL_COMMUNITY): Payer: Self-pay

## 2014-09-24 ENCOUNTER — Encounter (HOSPITAL_COMMUNITY): Payer: Self-pay

## 2014-09-24 ENCOUNTER — Observation Stay (HOSPITAL_COMMUNITY)
Admission: EM | Admit: 2014-09-24 | Discharge: 2014-09-25 | Disposition: A | Discharge status: Home / Self Care | Payer: Self-pay | Attending: Internal Medicine | Admitting: Internal Medicine

## 2014-09-24 DIAGNOSIS — I10 Essential (primary) hypertension: Secondary | ICD-10-CM | POA: Diagnosis present

## 2014-09-24 DIAGNOSIS — K219 Gastro-esophageal reflux disease without esophagitis: Secondary | ICD-10-CM | POA: Diagnosis present

## 2014-09-24 DIAGNOSIS — Z87891 Personal history of nicotine dependence: Secondary | ICD-10-CM | POA: Insufficient documentation

## 2014-09-24 DIAGNOSIS — M1990 Unspecified osteoarthritis, unspecified site: Secondary | ICD-10-CM | POA: Diagnosis present

## 2014-09-24 DIAGNOSIS — M158 Other polyosteoarthritis: Secondary | ICD-10-CM

## 2014-09-24 DIAGNOSIS — Z9010 Acquired absence of unspecified breast and nipple: Secondary | ICD-10-CM | POA: Insufficient documentation

## 2014-09-24 DIAGNOSIS — E7800 Pure hypercholesterolemia, unspecified: Secondary | ICD-10-CM | POA: Diagnosis present

## 2014-09-24 DIAGNOSIS — E8881 Metabolic syndrome: Secondary | ICD-10-CM | POA: Insufficient documentation

## 2014-09-24 DIAGNOSIS — R1310 Dysphagia, unspecified: Secondary | ICD-10-CM

## 2014-09-24 DIAGNOSIS — Z79899 Other long term (current) drug therapy: Secondary | ICD-10-CM | POA: Insufficient documentation

## 2014-09-24 DIAGNOSIS — Z90710 Acquired absence of both cervix and uterus: Secondary | ICD-10-CM | POA: Insufficient documentation

## 2014-09-24 DIAGNOSIS — E785 Hyperlipidemia, unspecified: Secondary | ICD-10-CM | POA: Insufficient documentation

## 2014-09-24 DIAGNOSIS — Z853 Personal history of malignant neoplasm of breast: Secondary | ICD-10-CM

## 2014-09-24 DIAGNOSIS — T783XXA Angioneurotic edema, initial encounter: Principal | ICD-10-CM | POA: Insufficient documentation

## 2014-09-24 DIAGNOSIS — J309 Allergic rhinitis, unspecified: Secondary | ICD-10-CM | POA: Insufficient documentation

## 2014-09-24 DIAGNOSIS — R7303 Prediabetes: Secondary | ICD-10-CM | POA: Insufficient documentation

## 2014-09-24 DIAGNOSIS — Z886 Allergy status to analgesic agent status: Secondary | ICD-10-CM | POA: Insufficient documentation

## 2014-09-24 HISTORY — DX: Essential (primary) hypertension: I10

## 2014-09-24 HISTORY — DX: Unspecified osteoarthritis, unspecified site: M19.90

## 2014-09-24 HISTORY — DX: Inflammatory liver disease, unspecified: K75.9

## 2014-09-24 HISTORY — DX: Bradycardia, unspecified: R00.1

## 2014-09-24 HISTORY — DX: Hyperlipidemia, unspecified: E78.5

## 2014-09-24 HISTORY — DX: Other chronic pain: G89.29

## 2014-09-24 HISTORY — DX: Low back pain, unspecified: M54.50

## 2014-09-24 HISTORY — DX: Low back pain: M54.5

## 2014-09-24 LAB — CBC WITH DIFFERENTIAL/PLATELET
BASOS ABS: 0.1 10*3/uL (ref 0.0–0.1)
Basophils Relative: 1 % (ref 0–1)
Eosinophils Absolute: 0.6 10*3/uL (ref 0.0–0.7)
Eosinophils Relative: 6 % — ABNORMAL HIGH (ref 0–5)
HEMATOCRIT: 35.9 % — AB (ref 36.0–46.0)
HEMOGLOBIN: 11.4 g/dL — AB (ref 12.0–15.0)
LYMPHS PCT: 43 % (ref 12–46)
Lymphs Abs: 4.6 10*3/uL — ABNORMAL HIGH (ref 0.7–4.0)
MCH: 21.9 pg — ABNORMAL LOW (ref 26.0–34.0)
MCHC: 31.8 g/dL (ref 30.0–36.0)
MCV: 68.9 fL — ABNORMAL LOW (ref 78.0–100.0)
MONOS PCT: 7 % (ref 3–12)
Monocytes Absolute: 0.8 10*3/uL (ref 0.1–1.0)
NEUTROS ABS: 4.6 10*3/uL (ref 1.7–7.7)
Neutrophils Relative %: 43 % (ref 43–77)
Platelets: 299 10*3/uL (ref 150–400)
RBC: 5.21 MIL/uL — AB (ref 3.87–5.11)
RDW: 17.9 % — ABNORMAL HIGH (ref 11.5–15.5)
WBC: 10.7 10*3/uL — ABNORMAL HIGH (ref 4.0–10.5)

## 2014-09-24 LAB — BASIC METABOLIC PANEL
ANION GAP: 8 (ref 5–15)
BUN: 16 mg/dL (ref 6–20)
CALCIUM: 8.7 mg/dL — AB (ref 8.9–10.3)
CO2: 26 mmol/L (ref 22–32)
Chloride: 105 mmol/L (ref 101–111)
Creatinine, Ser: 0.98 mg/dL (ref 0.44–1.00)
GFR calc Af Amer: >60 mL/min (ref >60)
GFR, EST NON AFRICAN AMERICAN: 60 mL/min — AB (ref >60)
GLUCOSE: 104 mg/dL — AB (ref 65–99)
Potassium: 4.1 mmol/L (ref 3.5–5.1)
Sodium: 139 mmol/L (ref 135–145)

## 2014-09-24 LAB — CBC
HCT: 36.5 % (ref 36.0–46.0)
Hemoglobin: 11.8 g/dL — ABNORMAL LOW (ref 12.0–15.0)
MCH: 22.1 pg — AB (ref 26.0–34.0)
MCHC: 32.3 g/dL (ref 30.0–36.0)
MCV: 68.2 fL — ABNORMAL LOW (ref 78.0–100.0)
Platelets: 287 10*3/uL (ref 150–400)
RBC: 5.35 MIL/uL — AB (ref 3.87–5.11)
RDW: 18 % — AB (ref 11.5–15.5)
WBC: 8 10*3/uL (ref 4.0–10.5)

## 2014-09-24 LAB — CREATININE, SERUM: Creatinine, Ser: 0.96 mg/dL (ref 0.44–1.00)

## 2014-09-24 LAB — TSH: TSH: 0.616 u[IU]/mL (ref 0.350–4.500)

## 2014-09-24 MED ORDER — PNEUMOCOCCAL VAC POLYVALENT 25 MCG/0.5ML IJ INJ
0.5000 mL | INJECTION | INTRAMUSCULAR | Status: AC
  Administered 2014-09-25: 0.5 mL via INTRAMUSCULAR
  Filled 2014-09-24: qty 0.5

## 2014-09-24 MED ORDER — ACETAMINOPHEN 325 MG PO TABS
650.0000 mg | ORAL_TABLET | Freq: Four times a day (QID) | ORAL | Status: DC | PRN
  Administered 2014-09-25: 650 mg via ORAL
  Filled 2014-09-24: qty 2

## 2014-09-24 MED ORDER — HYDRALAZINE HCL 20 MG/ML IJ SOLN: 10.0000 mg | Freq: Three times a day (TID) | INTRAMUSCULAR | Status: DC | PRN

## 2014-09-24 MED ORDER — PANTOPRAZOLE SODIUM 40 MG PO TBEC
40.0000 mg | DELAYED_RELEASE_TABLET | Freq: Every day | ORAL | Status: DC
  Administered 2014-09-24 – 2014-09-25 (×2): 40 mg via ORAL
  Filled 2014-09-24 (×2): qty 1

## 2014-09-24 MED ORDER — METHYLPREDNISOLONE SODIUM SUCC 125 MG IJ SOLR
125.0000 mg | Freq: Once | INTRAMUSCULAR | Status: AC
  Administered 2014-09-24: 125 mg via INTRAVENOUS
  Filled 2014-09-24: qty 2

## 2014-09-24 MED ORDER — HEPARIN SODIUM (PORCINE) 5000 UNIT/ML IJ SOLN
5000.0000 [IU] | Freq: Three times a day (TID) | INTRAMUSCULAR | Status: DC
  Administered 2014-09-24 – 2014-09-25 (×3): 5000 [IU] via SUBCUTANEOUS
  Filled 2014-09-24 (×3): qty 1

## 2014-09-24 MED ORDER — ONDANSETRON HCL 4 MG/2ML IJ SOLN: 4.0000 mg | Freq: Four times a day (QID) | INTRAMUSCULAR | Status: DC | PRN

## 2014-09-24 MED ORDER — LORATADINE 5 MG/5ML PO SYRP
10.0000 mg | ORAL_SOLUTION | Freq: Every day | ORAL | Status: DC
  Administered 2014-09-24 – 2014-09-25 (×2): 10 mg via ORAL
  Filled 2014-09-24 (×2): qty 10

## 2014-09-24 MED ORDER — FAMOTIDINE IN NACL 20-0.9 MG/50ML-% IV SOLN
20.0000 mg | Freq: Once | INTRAVENOUS | Status: AC
  Administered 2014-09-24: 20 mg via INTRAVENOUS
  Filled 2014-09-24: qty 50

## 2014-09-24 MED ORDER — CETYLPYRIDINIUM CHLORIDE 0.05 % MT LIQD
7.0000 mL | Freq: Two times a day (BID) | OROMUCOSAL | Status: DC
  Administered 2014-09-24 – 2014-09-25 (×3): 7 mL via OROMUCOSAL

## 2014-09-24 MED ORDER — ACETAMINOPHEN 650 MG RE SUPP: 650.0000 mg | Freq: Four times a day (QID) | RECTAL | Status: DC | PRN

## 2014-09-24 MED ORDER — ONDANSETRON HCL 4 MG PO TABS: 4.0000 mg | ORAL_TABLET | Freq: Four times a day (QID) | ORAL | Status: DC | PRN

## 2014-09-24 MED ORDER — DIPHENHYDRAMINE HCL 50 MG/ML IJ SOLN
25.0000 mg | Freq: Once | INTRAMUSCULAR | Status: AC
  Administered 2014-09-24: 25 mg via INTRAVENOUS
  Filled 2014-09-24: qty 1

## 2014-09-24 MED ORDER — FENTANYL CITRATE (PF) 100 MCG/2ML IJ SOLN
50.0000 ug | INTRAMUSCULAR | Status: DC | PRN
  Administered 2014-09-24: 50 ug via INTRAVENOUS
  Filled 2014-09-24 (×2): qty 2

## 2014-09-24 MED ORDER — FENTANYL CITRATE (PF) 100 MCG/2ML IJ SOLN
50.0000 ug | Freq: Once | INTRAMUSCULAR | Status: AC
  Administered 2014-09-24: 50 ug via INTRAVENOUS
  Filled 2014-09-24: qty 2

## 2014-09-24 MED ORDER — FAMOTIDINE IN NACL 20-0.9 MG/50ML-% IV SOLN
20.0000 mg | Freq: Two times a day (BID) | INTRAVENOUS | Status: DC
  Administered 2014-09-24 (×2): 20 mg via INTRAVENOUS
  Filled 2014-09-24 (×4): qty 50

## 2014-09-24 MED ORDER — SODIUM CHLORIDE 0.9 % IV SOLN
INTRAVENOUS | Status: DC
  Administered 2014-09-24: 11:00:00 via INTRAVENOUS

## 2014-09-24 MED ORDER — METHYLPREDNISOLONE SODIUM SUCC 40 MG IJ SOLR
40.0000 mg | Freq: Two times a day (BID) | INTRAMUSCULAR | Status: DC
  Administered 2014-09-24 – 2014-09-25 (×3): 40 mg via INTRAVENOUS
  Filled 2014-09-24 (×3): qty 1

## 2014-09-24 NOTE — ED Notes (Signed)
Xray at bedside,

## 2014-09-24 NOTE — ED Notes (Signed)
MD at bedside. 

## 2014-09-24 NOTE — ED Provider Notes (Signed)
CSN: [id number]     Arrival date & time 09/24/14  0451 History   First MD Initiated Contact with Patient 09/24/14 0500     Chief Complaint  Patient presents with  . Angioedema     (Consider location/radiation/quality/duration/timing/severity/associated sxs/prior Treatment) HPI 64 year old female presents to emergency department with complaint of left-sided tongue swelling, posterior oropharynx and throat swelling.  Patient denies any difficulty breathing.  She reports pain with swallowing.  Symptoms woke her up this morning around 245.  No prior history of same.  Patient has history of hypertension, is on lisinopril.  No prior history of angioedema.  She denies any fever, chills, nausea, vomiting, diarrhea.  She denies any recent dental procedures. Past Medical History  Diagnosis Date  . Cancer     breast  . Hypertension    Past Surgical History  Procedure Laterality Date  . Mastectomy    . Abdominal hysterectomy     History reviewed. No pertinent family history. History  Substance Use Topics  . Smoking status: Former Research scientist (life sciences)  . Smokeless tobacco: Not on file  . Alcohol Use: No   OB History    No data available     Review of Systems    See History of Present Illness; otherwise all other systems are reviewed and negative Allergies  Codeine  Home Medications   Prior to Admission medications   Medication Sig Start Date End Date Taking? Authorizing Provider  cephALEXin (KEFLEX) 500 MG capsule Take 1 capsule (500 mg total) by mouth 3 (three) times daily. 09/18/13   Rolland Porter, MD  ibuprofen (ADVIL,MOTRIN) 200 MG tablet Take 400 mg by mouth daily as needed. For pain    Historical Provider, MD  ondansetron (ZOFRAN) 4 MG tablet Take 1 tablet (4 mg total) by mouth every 8 (eight) hours as needed for nausea or vomiting. 09/18/13   Rolland Porter, MD   BP 153/69 mmHg  Pulse 43  Temp(Src) 99.3 F (37.4 C) (Oral)  Resp 20  Ht 5\' 6"  (1.676 m)  Wt 243 lb (110.224 kg)  BMI 39.24 kg/m2   SpO2 100% Physical Exam  Constitutional: She is oriented to person, place, and time. She appears well-developed and well-nourished.  HENT:  Head: Normocephalic and atraumatic.  Nose: Nose normal.  Mouth/Throat: Oropharynx is clear and moist.  Patient has angioedema to the left side of the tongue.  There is also soft tissue swelling to the left mandible and left throat.  There is no stridor.  Patient has pain with swallowing  Eyes: Conjunctivae and EOM are normal. Pupils are equal, round, and reactive to light.  Neck: Normal range of motion. Neck supple. No JVD present. No tracheal deviation present. No thyromegaly present.  Patient has soft tissue swelling to the submandibular region and left neck.  It is tender to palpation.  There is no woody induration to the floor of the mouth.  There is no lymphadenopathy.  Cardiovascular: Normal rate, regular rhythm, normal heart sounds and intact distal pulses.  Exam reveals no gallop and no friction rub.   No murmur heard. Pulmonary/Chest: Effort normal and breath sounds normal. No stridor. No respiratory distress. She has no wheezes. She has no rales. She exhibits no tenderness.  Abdominal: Soft. Bowel sounds are normal. She exhibits no distension and no mass. There is no tenderness. There is no rebound and no guarding.  Musculoskeletal: Normal range of motion. She exhibits no edema or tenderness.  Lymphadenopathy:    She has no cervical adenopathy.  Neurological:  She is alert and oriented to person, place, and time. She displays normal reflexes. She exhibits normal muscle tone. Coordination normal.  Skin: Skin is warm and dry. No rash noted. No erythema. No pallor.  Psychiatric: She has a normal mood and affect. Her behavior is normal. Judgment and thought content normal.  Nursing note and vitals reviewed.   ED Course  Procedures (including critical care time) Labs Review Labs Reviewed  CBC WITH DIFFERENTIAL/PLATELET - Abnormal; Notable for the  following:    WBC 10.7 (*)    RBC 5.21 (*)    Hemoglobin 11.4 (*)    HCT 35.9 (*)    MCV 68.9 (*)    MCH 21.9 (*)    RDW 17.9 (*)    Eosinophils Relative 6 (*)    Lymphs Abs 4.6 (*)    All other components within normal limits  BASIC METABOLIC PANEL - Abnormal; Notable for the following:    Glucose, Bld 104 (*)    Calcium 8.7 (*)    GFR calc non Af Amer 60 (*)    All other components within normal limits    Imaging Review Dg Neck Soft Tissue  09/24/2014   CLINICAL DATA:  Angioedema.  EXAM: NECK SOFT TISSUES - 1+ VIEW  COMPARISON:  None.  FINDINGS: Examination performed portably. There is no evidence of retropharyngeal soft tissue swelling or epiglottic enlargement. The cervical airway is patent. No radio-opaque foreign body identified.  IMPRESSION: No evidence of airway compromise on portable exam.   Electronically Signed   By: Jeb Levering M.D.   On: 09/24/2014 06:05   Dg Chest Port 1 View  09/24/2014   CLINICAL DATA:  Swallowing difficulty.  Angioedema.  EXAM: PORTABLE CHEST - 1 VIEW  COMPARISON:  05/25/2012  FINDINGS: Lower lung volumes from prior exam leading to crowding of bronchovascular structures. The heart appears at the upper limits of normal in size, there is tortuosity of the thoracic aorta. There is no confluent airspace disease, pleural effusion or pneumothorax. Overlying monitoring device in place.  IMPRESSION: Hypoventilatory chest with borderline cardiomegaly and crowding of bronchovascular structures.   Electronically Signed   By: Jeb Levering M.D.   On: 09/24/2014 06:01     EKG Interpretation   Date/Time:  Monday September 24 2014 05:02:59 EDT Ventricular Rate:  52 PR Interval:  150 QRS Duration: 87 QT Interval:  488 QTC Calculation: 454 R Axis:   71 Text Interpretation:  Sinus rhythm Confirmed by Philipe Laswell  MD, Remus Hagedorn ([id number]) on  09/24/2014 5:25:30 AM      MDM   Final diagnoses:  Swallowing difficulty  Angioedema, initial encounter   64 year old female with  angioedema most likely due to ACE inhibitor.  Differential also includes infection, patient has not had fever or recent dental procedure.  No recent sore throat.  Swelling is unilateral and appears clinically to be angioedema.  Patient feeling somewhat better after interventions here.  Will discuss with hospitalist for admission to stepdown for further monitoring.  Linton Flemings, MD 09/24/14 [phone]

## 2014-09-24 NOTE — ED Notes (Signed)
Dr Sharol Given, used hurricane spray to assist pt with swelling and pain, pt reports difficulty swallowing due to pain and using suction for secreations.

## 2014-09-24 NOTE — ED Notes (Signed)
Pt here for angioedema, pt reports on lisinopril for three years, pt has swelling to tongue and reports sob and pain in tongue and throat,

## 2014-09-24 NOTE — H&P (Signed)
[facility] History and Physical  [name] IRW:[id number] DOB: 1951/04/01 DOA: 09/24/2014  Referring physician: Dr. Sharol Given PCP: Marlou Sa, ERIC, MD   Chief Complaint: pain when swallowing and tongue swelling   HPI: [name] Cooper is a 64 y.o. female with PMH significant for HTN, HLD, OA, hx of breast cancer and GERD; presented to ED secondary to swelling in her mouth/throat and odynophagia. Patient reported symptoms started the night prior to admission and woke her up around 2:45 am with severe pain swallowing her own saliva. Patient check herself in the mirror an noticed her tongue was swollen and came to ED for further evaluation and treatment. Patient denies SOB, CP, fever, chills, nausea, vomiting, dysuria, melena, hematochezia or any other complaints.   In the ED patient was found to have no fever, just mild elevation of WBC's (after receiving steroids), no airway compromising signs and lateral neck x-ray without concerns for airway obstruction. [facility] called to admit for observation given angioedema and odynophagia.   Patient endorses this is the second time that she has experienced angioedema (last one approx 1-2 years ago)   Review of Systems:  Negative except as otherwise mentioned on HPI  Past Medical History  Diagnosis Date  . Cancer     breast  . Hypertension    Past Surgical History  Procedure Laterality Date  . Mastectomy    . Abdominal hysterectomy     Social History:  reports that she has quit smoking. She does not have any smokeless tobacco history on file. She reports that she does not drink alcohol or use illicit drugs.  Allergies  Allergen Reactions  . Codeine     REACTION: MAKES HER SLEEPY    Family History: positive for HTN and HLD; also with hx of cancer in her aunt.  Prior to Admission medications   Medication Sig Start Date End Date Taking? Authorizing Provider  cephALEXin (KEFLEX) 500 MG capsule Take 1 capsule (500 mg total) by mouth 3 (three)  times daily. 09/18/13   Rolland Porter, MD  ibuprofen (ADVIL,MOTRIN) 200 MG tablet Take 400 mg by mouth daily as needed. For pain    Historical Provider, MD  ondansetron (ZOFRAN) 4 MG tablet Take 1 tablet (4 mg total) by mouth every 8 (eight) hours as needed for nausea or vomiting. 09/18/13   Rolland Porter, MD   Physical Exam: Filed Vitals:   09/24/14 0715 09/24/14 0730 09/24/14 0745 09/24/14 0831  BP: 138/70 140/65 142/71 136/75  Pulse: 50 43 43 46  Temp:    97.9 F (36.6 C)  TempSrc:    Oral  Resp: 19 21 20 20   Height:      Weight:    116.574 kg (257 lb)  SpO2: 100% 100% 100% 96%    Wt Readings from Last 3 Encounters:  09/24/14 116.574 kg (257 lb)  03/21/08 112.22 kg (247 lb 6.4 oz)    General:  Appears calm and comfortable; currently afebrile and no complaining of SOB. Patient with discomfort and some difficulty when swallowing even her own saliva. Able to speak in full sentences Eyes: PERRL, normal lids, irises & conjunctiva, no icterus, no nystagmus ENT: grossly normal hearing, swelling of her tongue on the left side and extend to the back of her throat; no visualization of her uvula. No erythema, no exudates and no thrush. Neck: no LAD, masses or thyromegaly, no JVD Cardiovascular: sinus rhythm, bradycardia (low 50's on exam); no rubs, no gallops, no murmurs. Telemetry: bradycardia appreciated on ED tele-monitor Respiratory: CTA  bilaterally, no w/r/r. Normal respiratory effort. Abdomen: soft, nt, nd; positive BS Skin: no rash or induration seen on exam Musculoskeletal: grossly normal tone BUE/BLE; 1 Plus edema bilaterally  Psychiatric: grossly normal mood and affect, speech fluent and appropriate Neurologic: grossly non-focal.          Labs on Admission:  Basic Metabolic Panel:  Recent Labs Lab 09/24/14 0542  NA 139  K 4.1  CL 105  CO2 26  GLUCOSE 104*  BUN 16  CREATININE 0.98  CALCIUM 8.7*   CBC:  Recent Labs Lab 09/24/14 0542  WBC 10.7*  NEUTROABS 4.6  HGB 11.4*   HCT 35.9*  MCV 68.9*  PLT 299   Radiological Exams on Admission: Dg Neck Soft Tissue  09/24/2014   CLINICAL DATA:  Angioedema.  EXAM: NECK SOFT TISSUES - 1+ VIEW  COMPARISON:  None.  FINDINGS: Examination performed portably. There is no evidence of retropharyngeal soft tissue swelling or epiglottic enlargement. The cervical airway is patent. No radio-opaque foreign body identified.  IMPRESSION: No evidence of airway compromise on portable exam.   Electronically Signed   By: Jeb Levering M.D.   On: 09/24/2014 06:05   Dg Chest Port 1 View  09/24/2014   CLINICAL DATA:  Swallowing difficulty.  Angioedema.  EXAM: PORTABLE CHEST - 1 VIEW  COMPARISON:  05/25/2012  FINDINGS: Lower lung volumes from prior exam leading to crowding of bronchovascular structures. The heart appears at the upper limits of normal in size, there is tortuosity of the thoracic aorta. There is no confluent airspace disease, pleural effusion or pneumothorax. Overlying monitoring device in place.  IMPRESSION: Hypoventilatory chest with borderline cardiomegaly and crowding of bronchovascular structures.   Electronically Signed   By: Jeb Levering M.D.   On: 09/24/2014 06:01    EKG:  None   Assessment/Plan 1-Angioedema: patient was on lisinopril and has also been using a little more ibuprofen lately for OA pain -will stop lisinopril and ibuprofen -will start tx with solumedrol, loratadine and pepcid -patient will be admitted for observation, to make sure no progression or compromising of airways occurred  -full liquid diet -will check C1 inhibitor panel (patient reported this is the second time she had angioedema)  2-NEOPLASM, MALIGNANT, BREAST, HX OF: s/p mastectomy -currently in remission   3-HTN (hypertension): will need a different agent for BP control -initially will use hydralazine PRN; BP is currently stable -some Le edema appreciated, might benefit of diuretic  4-HLD (hyperlipidemia): not on statins -will  check lipid panel  5-GERD (gastroesophageal reflux disease): on pepcid as mentioned above -will also add protonix as she is on steroids now  6-OA (osteoarthritis):holding ibuprofen at this moment -will use tylenol   Code Status: Full DVT Prophylaxis:heparin  Family Communication: no family at bedside Disposition Plan: observation, med-surg (but required constant monitoring and immediate assessment if she calls complaining of SOB); LOS < 2 midnights   Time spent: 60 minutes  [place], [facility] Pager [phone]

## 2014-09-24 NOTE — ED Notes (Signed)
Pt resting with eyes closed, nad noted, denies pain, reports still has trouble swallowing.

## 2014-09-25 DIAGNOSIS — E8881 Metabolic syndrome: Secondary | ICD-10-CM | POA: Insufficient documentation

## 2014-09-25 DIAGNOSIS — R7309 Other abnormal glucose: Secondary | ICD-10-CM

## 2014-09-25 DIAGNOSIS — J309 Allergic rhinitis, unspecified: Secondary | ICD-10-CM | POA: Insufficient documentation

## 2014-09-25 DIAGNOSIS — M159 Polyosteoarthritis, unspecified: Secondary | ICD-10-CM

## 2014-09-25 DIAGNOSIS — R7303 Prediabetes: Secondary | ICD-10-CM | POA: Insufficient documentation

## 2014-09-25 LAB — C1 ESTERASE INHIBITOR: C1 ESTERASE INH: 22 mg/dL (ref 21–39)

## 2014-09-25 LAB — LIPID PANEL
Cholesterol: 225 mg/dL — ABNORMAL HIGH (ref 0–200)
HDL: 56 mg/dL (ref >40)
LDL CALC: 159 mg/dL — AB (ref 0–99)
TRIGLYCERIDES: 48 mg/dL (ref <150)
Total CHOL/HDL Ratio: 4 RATIO
VLDL: 10 mg/dL (ref 0–40)

## 2014-09-25 LAB — CBC
HEMATOCRIT: 33.6 % — AB (ref 36.0–46.0)
Hemoglobin: 10.6 g/dL — ABNORMAL LOW (ref 12.0–15.0)
MCH: 21.4 pg — ABNORMAL LOW (ref 26.0–34.0)
MCHC: 31.5 g/dL (ref 30.0–36.0)
MCV: 67.7 fL — AB (ref 78.0–100.0)
PLATELETS: 279 10*3/uL (ref 150–400)
RBC: 4.96 MIL/uL (ref 3.87–5.11)
RDW: 17.8 % — AB (ref 11.5–15.5)
WBC: 9 10*3/uL (ref 4.0–10.5)

## 2014-09-25 LAB — BASIC METABOLIC PANEL
Anion gap: 7 (ref 5–15)
BUN: 14 mg/dL (ref 6–20)
CALCIUM: 8.7 mg/dL — AB (ref 8.9–10.3)
CHLORIDE: 108 mmol/L (ref 101–111)
CO2: 23 mmol/L (ref 22–32)
Creatinine, Ser: 0.81 mg/dL (ref 0.44–1.00)
GFR calc Af Amer: >60 mL/min (ref >60)
Glucose, Bld: 133 mg/dL — ABNORMAL HIGH (ref 65–99)
Potassium: 4.4 mmol/L (ref 3.5–5.1)
Sodium: 138 mmol/L (ref 135–145)

## 2014-09-25 LAB — HEMOGLOBIN A1C
Hgb A1c MFr Bld: 6.3 % — ABNORMAL HIGH (ref 4.8–5.6)
Mean Plasma Glucose: 134 mg/dL

## 2014-09-25 MED ORDER — LORATADINE 10 MG PO TBDP: 10.0000 mg | ORAL_TABLET | Freq: Every day | ORAL | Status: DC

## 2014-09-25 MED ORDER — FAMOTIDINE 20 MG PO TABS: 20.0000 mg | ORAL_TABLET | Freq: Two times a day (BID) | ORAL | Status: DC

## 2014-09-25 MED ORDER — TRIAMTERENE-HCTZ 37.5-25 MG PO TABS: 1.0000 | ORAL_TABLET | Freq: Every day | ORAL | Status: DC

## 2014-09-25 MED ORDER — HYDRALAZINE HCL 25 MG PO TABS: 25.0000 mg | ORAL_TABLET | Freq: Three times a day (TID) | ORAL | Status: DC

## 2014-09-25 MED ORDER — PANTOPRAZOLE SODIUM 40 MG PO TBEC: 40.0000 mg | DELAYED_RELEASE_TABLET | Freq: Every day | ORAL | Status: DC

## 2014-09-25 MED ORDER — PREDNISONE 20 MG PO TABS: ORAL_TABLET | ORAL | Status: DC

## 2014-09-25 MED ORDER — FAMOTIDINE 20 MG PO TABS
20.0000 mg | ORAL_TABLET | Freq: Two times a day (BID) | ORAL | Status: DC
  Administered 2014-09-25: 20 mg via ORAL
  Filled 2014-09-25: qty 1

## 2014-09-25 NOTE — Progress Notes (Signed)
Discharged home accompanied by grand daughter.

## 2014-09-25 NOTE — Discharge Summary (Signed)
Physician Discharge Summary  [name] Cooper KDX:[id number] DOB: 1951/03/23 DOA: 09/24/2014  PCP: Kevan Ny, MD  Admit date: 09/24/2014 Discharge date: 09/25/2014  Time spent: 35 minutes  Recommendations for Outpatient Follow-up:  Please follow final results of C1 inhibitor panel Reassess blood pressure and adjust antihypertensive agents as needed Close follow-up to patient's CBG and A1c as she has been found to be in prediabetes range Please reassess and determine further treatment for patient hyperlipidemia  Discharge Diagnoses:  Angioedema Essential hypertension GERD Allergic rhinitis Hyperlipidemia Also arthritis NEOPLASM, MALIGNANT, BREAST, HX OF Swallowing difficulty and odynophagia    Discharge Condition: Stable and improved. Patient will be discharged home and she will follow with PCP in 10 days. Patient has been advised to stop the use of lisinopril and to minimize/avoid as much as possible the use of NSAIDs.  Diet recommendation: Low sodium, low carbohydrates and low fat diet  Filed Weights   09/24/14 0500 09/24/14 0831  Weight: 110.224 kg (243 lb) 116.574 kg (257 lb)    History of present illness:  64 y.o. female with PMH significant for HTN, HLD, OA, hx of breast cancer and GERD; presented to ED secondary to swelling in her mouth/throat and odynophagia. Patient reported symptoms started the night prior to admission and woke her up around 2:45 am with severe pain swallowing her own saliva. Patient check herself in the mirror an noticed her tongue was swollen and came to ED for further evaluation and treatment. Patient denies SOB, CP, fever, chills, nausea, vomiting, dysuria, melena, hematochezia or any other complaints.   Hospital Course:  1-Angioedema: patient was on lisinopril and has also been using a little more ibuprofen lately for OA pain -will stop lisinopril and ibuprofen -No difficulty swallowing at this moment, complete resolution of the swelling and tolerating  diet. -Will discharge with tapering prednisone over the next 5 days and also 5 days of Pepcid -will follow final results off C1 inhibitor panel (since patient reported this is the second time she had angioedema) -Patient will have follow-up with PCP in approximately 10 days.  2-NEOPLASM, MALIGNANT, BREAST, HX OF: s/p mastectomy -currently in remission  -Continue outpatient follow-up with her oncologist  3-HTN (hypertension): Given episode of angioedema lisinopril has been discontinue -Patient will continue to use her HCTZ along with hydralazine -Patient has been encouraged to follow low sodium diet and further adjustment to her antihypertensive agents will be made as needed by her PCP.  4-HLD (hyperlipidemia): not on statins -LDL 159 -Patient reports that last year she experienced an episode of angioedema and was told that the statins were responsible for that -Has recommended to follow low fat diet, and to use Fish oil -Follow up with PCP to determine further medication therapy for her cholesterol levels  5-GERD (gastroesophageal reflux disease): on pepcid as mentioned above -will also add protonix as she would be on steroids, for better protection  6-OA (osteoarthritis): Will recommend to minimize his/avoid use off NSAID's due to presentation of angioedema -continue to use tylenol as needed  7-Pre-diabetes: Hemoglobin A1c 6.3 -Patient advised to follow low carbohydrate diet and will need close follow-up of her CBGs and A1c in the future to determine needs of hypoglycemic agents if her sugar continue worsening.  8-allergic rhinitis: Patient has been started on loratadine.    Procedures:  See below for x-ray reports  Consultations:  None  Discharge Exam: Filed Vitals:   09/25/14 0454  BP: 150/77  Pulse: 45  Temp: 98.4 F (36.9 C)  Resp: 17  General: Afebrile, denies chest pain, no difficulty breathing; patient with resolution in her tongue swelling and is now able  to eat without discomfort. Cardiovascular: S1 and S2 appreciated; patient with sinus bradycardiaonly when sleeping. There is no murmurs, no gallops, no rubs appreciated on exam. Respiratory: Clear to auscultation bilaterally Abdomen: Soft, nontender, nondistended, positive bowel sounds Extremities: Trace edema bilaterally (affecting mainly both ankles)  Discharge Instructions   Discharge Instructions    Diet - low sodium heart healthy    Complete by:  As directed      Discharge instructions    Complete by:  As directed   Stop lisinopril Take medications as prescribed Follow low sodium diet, low carbohydrates and low fat diet Arrange follow up with PCP in 10 days Ok to use over the counter fish oil 1000mg  twice a day to help with cholesterol control          Current Discharge Medication List    START taking these medications   Details  famotidine (PEPCID) 20 MG tablet Take 1 tablet (20 mg total) by mouth 2 (two) times daily. Qty: 20 tablet, Refills: 0    hydrALAZINE (APRESOLINE) 25 MG tablet Take 1 tablet (25 mg total) by mouth 3 (three) times daily. Qty: 90 tablet, Refills: 1    loratadine (CLARITIN REDITABS) 10 MG dissolvable tablet Take 1 tablet (10 mg total) by mouth daily. Qty: 30 tablet, Refills: 1    pantoprazole (PROTONIX) 40 MG tablet Take 1 tablet (40 mg total) by mouth daily at 12 noon. Qty: 30 tablet, Refills: 1    predniSONE (DELTASONE) 20 MG tablet Take 2 tablets by mouth daily X 1 day, then 1 tablet by mouth daily X 2 days; then 1/2 tablet by mouth daily X 2 days and stop prednisone Qty: 8 tablet, Refills: 0    triamterene-hydrochlorothiazide (MAXZIDE-25) 37.5-25 MG per tablet Take 1 tablet by mouth daily. Qty: 30 tablet, Refills: 1      CONTINUE these medications which have NOT CHANGED   Details  acetaminophen (TYLENOL) 650 MG CR tablet Take 1,300 mg by mouth every 8 (eight) hours as needed for pain.      STOP taking these medications      lisinopril-hydrochlorothiazide (PRINZIDE,ZESTORETIC) 20-12.5 MG per tablet      cephALEXin (KEFLEX) 500 MG capsule      ondansetron (ZOFRAN) 4 MG tablet        Allergies  Allergen Reactions  . Codeine     REACTION: MAKES HER SLEEPY   Follow-up Information    Follow up with Marlou Sa, ERIC, MD. Schedule an appointment as soon as possible for a visit in 10 days.   Specialty:  Internal Medicine   Contact information:   [address] [place] [address] [phone]       The results of significant diagnostics from this hospitalization (including imaging, microbiology, ancillary and laboratory) are listed below for reference.    Significant Diagnostic Studies: Dg Neck Soft Tissue  09/24/2014   CLINICAL DATA:  Angioedema.  EXAM: NECK SOFT TISSUES - 1+ VIEW  COMPARISON:  None.  FINDINGS: Examination performed portably. There is no evidence of retropharyngeal soft tissue swelling or epiglottic enlargement. The cervical airway is patent. No radio-opaque foreign body identified.  IMPRESSION: No evidence of airway compromise on portable exam.   Electronically Signed   By: Jeb Levering M.D.   On: 09/24/2014 06:05   Dg Chest Port 1 View  09/24/2014   CLINICAL DATA:  Swallowing difficulty.  Angioedema.  EXAM: PORTABLE CHEST - 1 VIEW  COMPARISON:  05/25/2012  FINDINGS: Lower lung volumes from prior exam leading to crowding of bronchovascular structures. The heart appears at the upper limits of normal in size, there is tortuosity of the thoracic aorta. There is no confluent airspace disease, pleural effusion or pneumothorax. Overlying monitoring device in place.  IMPRESSION: Hypoventilatory chest with borderline cardiomegaly and crowding of bronchovascular structures.   Electronically Signed   By: Jeb Levering M.D.   On: 09/24/2014 06:01    Microbiology: No results found for this or any previous visit (from the past 240 hour(s)).   Labs: Basic Metabolic Panel:  Recent Labs Lab  09/24/14 0542 09/24/14 1001 09/25/14 0500  NA 139  --  138  K 4.1  --  4.4  CL 105  --  108  CO2 26  --  23  GLUCOSE 104*  --  133*  BUN 16  --  14  CREATININE 0.98 0.96 0.81  CALCIUM 8.7*  --  8.7*   CBC:  Recent Labs Lab 09/24/14 0542 09/24/14 1001 09/25/14 0500  WBC 10.7* 8.0 9.0  NEUTROABS 4.6  --   --   HGB 11.4* 11.8* 10.6*  HCT 35.9* 36.5 33.6*  MCV 68.9* 68.2* 67.7*  PLT 299 287 279    Signed:  Barton Dubois  [facility] 09/25/2014, 9:05 AM

## 2014-10-17 ENCOUNTER — Emergency Department (HOSPITAL_COMMUNITY)
Admission: EM | Admit: 2014-10-17 | Discharge: 2014-10-17 | Disposition: A | Discharge status: Home / Self Care | Payer: No Typology Code available for payment source | Attending: Emergency Medicine | Admitting: Emergency Medicine

## 2014-10-17 ENCOUNTER — Encounter (HOSPITAL_COMMUNITY): Payer: Self-pay | Admitting: Emergency Medicine

## 2014-10-17 ENCOUNTER — Emergency Department (HOSPITAL_COMMUNITY): Payer: No Typology Code available for payment source

## 2014-10-17 DIAGNOSIS — G8929 Other chronic pain: Secondary | ICD-10-CM | POA: Diagnosis not present

## 2014-10-17 DIAGNOSIS — Z8639 Personal history of other endocrine, nutritional and metabolic disease: Secondary | ICD-10-CM | POA: Diagnosis not present

## 2014-10-17 DIAGNOSIS — M545 Low back pain, unspecified: Secondary | ICD-10-CM

## 2014-10-17 DIAGNOSIS — S199XXA Unspecified injury of neck, initial encounter: Secondary | ICD-10-CM | POA: Diagnosis not present

## 2014-10-17 DIAGNOSIS — Z8719 Personal history of other diseases of the digestive system: Secondary | ICD-10-CM | POA: Diagnosis not present

## 2014-10-17 DIAGNOSIS — I10 Essential (primary) hypertension: Secondary | ICD-10-CM | POA: Diagnosis not present

## 2014-10-17 DIAGNOSIS — Z79899 Other long term (current) drug therapy: Secondary | ICD-10-CM | POA: Diagnosis not present

## 2014-10-17 DIAGNOSIS — M542 Cervicalgia: Secondary | ICD-10-CM

## 2014-10-17 DIAGNOSIS — Z853 Personal history of malignant neoplasm of breast: Secondary | ICD-10-CM | POA: Insufficient documentation

## 2014-10-17 DIAGNOSIS — Y998 Other external cause status: Secondary | ICD-10-CM | POA: Insufficient documentation

## 2014-10-17 DIAGNOSIS — S3992XA Unspecified injury of lower back, initial encounter: Secondary | ICD-10-CM | POA: Insufficient documentation

## 2014-10-17 DIAGNOSIS — Z720 Tobacco use: Secondary | ICD-10-CM | POA: Insufficient documentation

## 2014-10-17 DIAGNOSIS — Z8739 Personal history of other diseases of the musculoskeletal system and connective tissue: Secondary | ICD-10-CM | POA: Insufficient documentation

## 2014-10-17 DIAGNOSIS — S0990XA Unspecified injury of head, initial encounter: Secondary | ICD-10-CM | POA: Diagnosis not present

## 2014-10-17 DIAGNOSIS — Y92410 Unspecified street and highway as the place of occurrence of the external cause: Secondary | ICD-10-CM | POA: Diagnosis not present

## 2014-10-17 DIAGNOSIS — Y9389 Activity, other specified: Secondary | ICD-10-CM | POA: Insufficient documentation

## 2014-10-17 MED ORDER — METHOCARBAMOL 500 MG PO TABS: 500.0000 mg | ORAL_TABLET | Freq: Two times a day (BID) | ORAL | Status: DC | PRN

## 2014-10-17 MED ORDER — ACETAMINOPHEN 325 MG PO TABS
650.0000 mg | ORAL_TABLET | Freq: Once | ORAL | Status: AC
  Administered 2014-10-17: 650 mg via ORAL
  Filled 2014-10-17: qty 2

## 2014-10-17 NOTE — ED Notes (Signed)
Pt stable, ambulatory, states understanding of discharge instructions 
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
## 2015-09-13 ENCOUNTER — Ambulatory Visit (INDEPENDENT_AMBULATORY_CARE_PROVIDER_SITE_OTHER): Payer: Commercial Managed Care - HMO | Admitting: Family Medicine

## 2015-09-13 ENCOUNTER — Encounter: Payer: Self-pay | Admitting: Family Medicine

## 2015-09-13 VITALS — BP 126/73 | HR 76 | Temp 97.9°F | Resp 18 | Ht 66.0 in | Wt 285.0 lb

## 2015-09-13 DIAGNOSIS — I10 Essential (primary) hypertension: Secondary | ICD-10-CM | POA: Diagnosis not present

## 2015-09-13 DIAGNOSIS — Z1211 Encounter for screening for malignant neoplasm of colon: Secondary | ICD-10-CM | POA: Diagnosis not present

## 2015-09-13 DIAGNOSIS — Z1239 Encounter for other screening for malignant neoplasm of breast: Secondary | ICD-10-CM

## 2015-09-13 LAB — LIPID PANEL
CHOLESTEROL: 220 mg/dL — AB (ref 125–200)
HDL: 70 mg/dL (ref >=46)
LDL Cholesterol: 134 mg/dL — ABNORMAL HIGH (ref <130)
Total CHOL/HDL Ratio: 3.1 Ratio (ref <=5.0)
Triglycerides: 82 mg/dL (ref <150)
VLDL: 16 mg/dL (ref <30)

## 2015-09-13 LAB — COMPLETE METABOLIC PANEL WITH GFR
ALT: 10 U/L (ref 6–29)
AST: 16 U/L (ref 10–35)
Albumin: 4.1 g/dL (ref 3.6–5.1)
Alkaline Phosphatase: 85 U/L (ref 33–130)
BILIRUBIN TOTAL: 0.5 mg/dL (ref 0.2–1.2)
BUN: 21 mg/dL (ref 7–25)
CO2: 26 mmol/L (ref 20–31)
Calcium: 9.9 mg/dL (ref 8.6–10.4)
Chloride: 103 mmol/L (ref 98–110)
Creat: 1.25 mg/dL — ABNORMAL HIGH (ref 0.50–0.99)
GFR, EST AFRICAN AMERICAN: 52 mL/min — AB (ref >=60)
GFR, EST NON AFRICAN AMERICAN: 45 mL/min — AB (ref >=60)
GLUCOSE: 94 mg/dL (ref 65–99)
POTASSIUM: 4.5 mmol/L (ref 3.5–5.3)
SODIUM: 140 mmol/L (ref 135–146)
Total Protein: 7.3 g/dL (ref 6.1–8.1)

## 2015-09-13 LAB — CBC WITH DIFFERENTIAL/PLATELET
BASOS ABS: 83 {cells}/uL (ref 0–200)
BASOS PCT: 1 %
EOS PCT: 7 %
Eosinophils Absolute: 581 cells/uL — ABNORMAL HIGH (ref 15–500)
HCT: 36.7 % (ref 35.0–45.0)
HEMOGLOBIN: 11.5 g/dL — AB (ref 11.7–15.5)
LYMPHS ABS: 3320 {cells}/uL (ref 850–3900)
Lymphocytes Relative: 40 %
MCH: 21 pg — AB (ref 27.0–33.0)
MCHC: 31.3 g/dL — ABNORMAL LOW (ref 32.0–36.0)
MCV: 67 fL — ABNORMAL LOW (ref 80.0–100.0)
MONOS PCT: 8 %
MPV: 9.7 fL (ref 7.5–12.5)
Monocytes Absolute: 664 cells/uL (ref 200–950)
NEUTROS ABS: 3652 {cells}/uL (ref 1500–7800)
Neutrophils Relative %: 44 %
PLATELETS: 285 10*3/uL (ref 140–400)
RBC: 5.48 MIL/uL — AB (ref 3.80–5.10)
RDW: 19 % — ABNORMAL HIGH (ref 11.0–15.0)
WBC: 8.3 10*3/uL (ref 3.8–10.8)

## 2015-09-13 MED ORDER — HYDRALAZINE HCL 25 MG PO TABS: 25.0000 mg | ORAL_TABLET | Freq: Three times a day (TID) | ORAL | Status: DC

## 2015-09-13 MED ORDER — TRIAMTERENE-HCTZ 37.5-25 MG PO TABS: 1.0000 | ORAL_TABLET | Freq: Every day | ORAL | Status: DC

## 2015-09-13 MED ORDER — PANTOPRAZOLE SODIUM 40 MG PO TBEC: 40.0000 mg | DELAYED_RELEASE_TABLET | Freq: Every day | ORAL | Status: DC

## 2015-09-13 MED ORDER — NAPROXEN 500 MG PO TABS: 500.0000 mg | ORAL_TABLET | Freq: Two times a day (BID) | ORAL | Status: DC

## 2015-09-13 NOTE — Progress Notes (Signed)
Patient ID: [name] Cooper, female   DOB: 1951-03-16, 65 y.o.   MRN: [id number]   [name] Cooper, is a 65 y.o. female  [id number]  [id number]  DOB - [birth year]  CC:  Chief Complaint  Patient presents with  . Establish Care  . Urinary Retention  . Gastroesophageal Reflux  . Leg Swelling       HPI: [name] Cooper is a 65 y.o. female here to establish care. She has a history of hypertension, GERD, back and leg pain. Her only medications currently are apresolone and Maxzide.  Allergies  Allergen Reactions  . Codeine     REACTION: MAKES HER SLEEPY   Past Medical History  Diagnosis Date  . Hypertension   . Breast cancer, right breast (HCC)     S/P mastectomy  . Arthritis     "down the right side of my body" (09/24/2014)  . Hyperlipemia   . Slow heart rate dx'd 09/24/2014  . 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 Chronic lower back pain    Current Outpatient Prescriptions on File Prior to Visit  Medication Sig Dispense Refill  . acetaminophen (TYLENOL) 650 MG CR tablet Take 1,300 mg by mouth every 8 (eight) hours as needed for pain.    . famotidine (PEPCID) 20 MG tablet Take 1 tablet (20 mg total) by mouth 2 (two) times daily. (Patient not taking: Reported on 09/13/2015) 20 tablet 0  . loratadine (CLARITIN REDITABS) 10 MG dissolvable tablet Take 1 tablet (10 mg total) by mouth daily. (Patient not taking: Reported on 09/13/2015) 30 tablet 1  . methocarbamol (ROBAXIN) 500 MG tablet Take 1 tablet (500 mg total) by mouth 2 (two) times daily as needed for muscle spasms. (Patient not taking: Reported on 09/13/2015) 20 tablet 0  . predniSONE (DELTASONE) 20 MG tablet Take 2 tablets by mouth daily X 1 day, then 1 tablet by mouth daily X 2 days; then 1/2 tablet by mouth daily X 2 days and stop prednisone (Patient not taking: Reported on 09/13/2015) 8 tablet 0   No current facility-administered medications on file prior to visit.   History reviewed. No pertinent family  history. Social History   Social History  . Marital Status: Widowed    Spouse Name: N/A  . Number of Children: N/A  . Years of Education: N/A   Occupational History  . Not on file.   Social History Main Topics  . Smoking status: Current Every Day Smoker -- 0.25 packs/day for 43 years    Types: Cigarettes  . Smokeless tobacco: Never Used     Comment: 09/24/2014 "I've quit smoking 3 times"  . Alcohol Use: No  . Drug Use: No     Comment: 09/24/2014 "last crack was in 2013"  . Sexual Activity: Not on file   Other Topics Concern  . Not on file   Social History Narrative    Review of Systems: Constitutional: Negative for fever, chills, appetite change, weight loss,  Fatigue. She does report some insomnia Skin: Negative for rashes or lesions of concern. HENT: Negative for ear pain, ear discharge.nose bleeds Eyes: Negative for pain, discharge, redness, itching and visual disturbance. Neck: Negative for pain, stiffness Respiratory: Positive for occasional  cough, shortness of breath,   Cardiovascular: Negative for chest pain, palpitations and leg swelling. Gastrointestinal: Negative for abdominal pain, nausea, vomiting, diarrhea, constipations Genitourinary: Negative for dysuria, urgency, frequency, hematuria, She reports decrease in urination for a few weeks. Musculoskeletal: Positivefor back pain. She reports  some swelling and discomfort in her right leg around the knee Neurological: Negative for dizziness, tremors, seizures, syncope,   light-headedness, numbness and headaches.  Hematological: Negative for easy bruising or bleeding Psychiatric/Behavioral: Negative for depression, anxiety, decreased concentration, confusion   Objective:   Filed Vitals:   09/13/15 1308  BP: 126/73  Pulse: 76  Temp: 97.9 F (36.6 C)  Resp: 18    Physical Exam: Constitutional: Patient appears well-developed and well-nourished. No distress. HENT: Normocephalic, atraumatic, External right and  left ear normal. Oropharynx is clear and moist.  Eyes: Conjunctivae and EOM are normal. PERRLA, no scleral icterus. Neck: Normal ROM. Neck supple. No lymphadenopathy, No thyromegaly. CVS: RRR, S1/S2 +, no murmurs, no gallops, no rubs Pulmonary: Effort and breath sounds normal, no stridor, rhonchi, wheezes, rales.  Abdominal: Soft. Normoactive BS,, no distension, tenderness, rebound or guarding.  Musculoskeletal: Normal range of motion. No edema and no tenderness. I do not appreciate any significant swelling of the right leg compared to left Neuro: Alert.Normal muscle tone coordination. Non-focal Skin: Skin is warm and dry. No rash noted. Not diaphoretic. No erythema. No pallor. Psychiatric: Normal mood and affect. Behavior, judgment, thought content normal.  Lab Results  Component Value Date   WBC 9.0 09/25/2014   HGB 10.6* 09/25/2014   HCT 33.6* 09/25/2014   MCV 67.7* 09/25/2014   PLT 279 09/25/2014   Lab Results  Component Value Date   CREATININE 0.81 09/25/2014   BUN 14 09/25/2014   NA 138 09/25/2014   K 4.4 09/25/2014   CL 108 09/25/2014   CO2 23 09/25/2014    Lab Results  Component Value Date   HGBA1C 6.3* 09/24/2014   Lipid Panel     Component Value Date/Time   CHOL 225* 09/25/2014 0500   TRIG 48 09/25/2014 0500   HDL 56 09/25/2014 0500   CHOLHDL 4.0 09/25/2014 0500   VLDL 10 09/25/2014 0500   LDLCALC 159* 09/25/2014 0500       Assessment and plan:   1. Essential hypertension  - COMPLETE METABOLIC PANEL WITH GFR - CBC with Differential - Lipid panel - Hepatitis C Antibody  2. Breast cancer screening  - MM DIGITAL SCREENING BILATERAL; Future  3. Colon cancer screening  - Ambulatory referral to Gastroenterology   Return in about 3 months (around 12/14/2015).  The patient was given clear instructions to go to ER or return to medical center if symptoms don't improve, worsen or new problems develop. The patient verbalized understanding.    Micheline Chapman FNP  09/13/2015, 3:28 PM

## 2015-09-13 NOTE — Patient Instructions (Signed)
We will call about lab work. Will put in referral for orthopedist about back and leg.

## 2015-09-14 LAB — HEPATITIS C ANTIBODY: HCV Ab: NEGATIVE

## 2015-09-23 ENCOUNTER — Ambulatory Visit (AMBULATORY_SURGERY_CENTER): Payer: Self-pay | Admitting: *Deleted

## 2015-09-23 VITALS — Ht 66.0 in | Wt 288.0 lb

## 2015-09-23 DIAGNOSIS — Z1211 Encounter for screening for malignant neoplasm of colon: Secondary | ICD-10-CM

## 2015-09-23 MED ORDER — NA SULFATE-K SULFATE-MG SULF 17.5-3.13-1.6 GM/177ML PO SOLN: 1.0000 | Freq: Once | ORAL | Status: DC

## 2015-09-23 NOTE — Progress Notes (Signed)
No egg or soy allergy. No anesthesia problems.  No home O2.  No diet meds.  No emmi.  

## 2015-09-24 ENCOUNTER — Telehealth: Payer: Self-pay | Admitting: *Deleted

## 2015-09-24 NOTE — Telephone Encounter (Signed)
-----   Message from Micheline Chapman, NP sent at 09/20/2015  7:52 AM EDT ----- HCV negative. CBC acceptable. Kidney function decreased. Need to keep blood pressure under good control, avoid NSAIDS. If possible stop naproxen.

## 2015-09-24 NOTE — Telephone Encounter (Signed)
Patient verified DOB Patient is aware of HCV being negative and CBC being acceptable.  Patient is aware of kidney function decreasing and the need to eliminate NSAIDS and stop naprosyn. Patient expressed her understanding and had no further questions at this time.

## 2015-09-26 ENCOUNTER — Ambulatory Visit
Admission: RE | Admit: 2015-09-26 | Discharge: 2015-09-26 | Disposition: A | Discharge status: Home / Self Care | Payer: Medicare HMO | Source: Ambulatory Visit | Attending: Family Medicine | Admitting: Family Medicine

## 2015-09-26 DIAGNOSIS — Z1239 Encounter for other screening for malignant neoplasm of breast: Secondary | ICD-10-CM

## 2015-09-30 ENCOUNTER — Ambulatory Visit (AMBULATORY_SURGERY_CENTER): Payer: Medicare HMO | Admitting: Internal Medicine

## 2015-09-30 ENCOUNTER — Encounter: Payer: Self-pay | Admitting: Internal Medicine

## 2015-09-30 VITALS — BP 142/77 | HR 55 | Temp 97.3°F | Resp 16 | Ht 66.0 in | Wt 288.0 lb

## 2015-09-30 DIAGNOSIS — K635 Polyp of colon: Secondary | ICD-10-CM | POA: Diagnosis not present

## 2015-09-30 DIAGNOSIS — D128 Benign neoplasm of rectum: Secondary | ICD-10-CM

## 2015-09-30 DIAGNOSIS — K621 Rectal polyp: Secondary | ICD-10-CM | POA: Diagnosis not present

## 2015-09-30 DIAGNOSIS — D129 Benign neoplasm of anus and anal canal: Secondary | ICD-10-CM

## 2015-09-30 DIAGNOSIS — Z1211 Encounter for screening for malignant neoplasm of colon: Secondary | ICD-10-CM

## 2015-09-30 MED ORDER — SODIUM CHLORIDE 0.9 % IV SOLN: 500.0000 mL | INTRAVENOUS | Status: DC

## 2015-09-30 NOTE — Patient Instructions (Signed)
YOU HAD AN ENDOSCOPIC PROCEDURE TODAY AT THE  ENDOSCOPY CENTER:   Refer to the procedure report that was given to you for any specific questions about what was found during the examination.  If the procedure report does not answer your questions, please call your gastroenterologist to clarify.  If you requested that your care partner not be given the details of your procedure findings, then the procedure report has been included in a sealed envelope for you to review at your convenience later.  YOU SHOULD EXPECT: Some feelings of bloating in the abdomen. Passage of more gas than usual.  Walking can help get rid of the air that was put into your GI tract during the procedure and reduce the bloating. If you had a lower endoscopy (such as a colonoscopy or flexible sigmoidoscopy) you may notice spotting of blood in your stool or on the toilet paper. If you underwent a bowel prep for your procedure, you may not have a normal bowel movement for a few days.  Please Note:  You might notice some irritation and congestion in your nose or some drainage.  This is from the oxygen used during your procedure.  There is no need for concern and it should clear up in a day or so.  SYMPTOMS TO REPORT IMMEDIATELY:   Following lower endoscopy (colonoscopy or flexible sigmoidoscopy):  Excessive amounts of blood in the stool  Significant tenderness or worsening of abdominal pains  Swelling of the abdomen that is new, acute  Fever of 100F or higher    For urgent or emergent issues, a gastroenterologist can be reached at any hour by calling [phone].   DIET: Your first meal following the procedure should be a small meal and then it is ok to progress to your normal diet. Heavy or fried foods are harder to digest and may make you feel nauseous or bloated.  Likewise, meals heavy in dairy and vegetables can increase bloating.  Drink plenty of fluids but you should avoid alcoholic beverages for 24  hours.  ACTIVITY:  You should plan to take it easy for the rest of today and you should NOT DRIVE or use heavy machinery until tomorrow (because of the sedation medicines used during the test).    FOLLOW UP: Our staff will call the number listed on your records the next business day following your procedure to check on you and address any questions or concerns that you may have regarding the information given to you following your procedure. If we do not reach you, we will leave a message.  However, if you are feeling well and you are not experiencing any problems, there is no need to return our call.  We will assume that you have returned to your regular daily activities without incident.  If any biopsies were taken you will be contacted by phone or by letter within the next 1-3 weeks.  Please call us at [phone] if you have not heard about the biopsies in 3 weeks.    SIGNATURES/CONFIDENTIALITY: You and/or your care partner have signed paperwork which will be entered into your electronic medical record.  These signatures attest to the fact that that the information above on your After Visit Summary has been reviewed and is understood.  Full responsibility of the confidentiality of this discharge information lies with you and/or your care-partner.   Resume medications. Information given on polyps,diverticulosis and high fiber diet. 

## 2015-09-30 NOTE — Progress Notes (Signed)
Called to room to assist during endoscopic procedure.  Patient ID and intended procedure confirmed with present staff. Received instructions for my participation in the procedure from the performing physician.  

## 2015-09-30 NOTE — Op Note (Signed)
[facility] Patient Name: [name] Cooper Procedure Date: 09/30/2015 8:35 AM MRN: [id number] Endoscopist: Docia Chuck. Henrene Pastor , MD Age: 65 Referring MD:  Date of Birth: 1950-07-09 Gender: Female Procedure:                Colonoscopy, with cold snare polypectomy x 1 Indications:              Screening for colorectal malignant neoplasm Medicines:                Monitored Anesthesia Care Procedure:                Pre-Anesthesia Assessment:                           - Prior to the procedure, a History and Physical                            was performed, and patient medications and                            allergies were reviewed. The patient's tolerance of                            previous anesthesia was also reviewed. The risks                            and benefits of the procedure and the sedation                            options and risks were discussed with the patient.                            All questions were answered, and informed consent                            was obtained. Prior Anticoagulants: The patient has                            taken no previous anticoagulant or antiplatelet                            agents. ASA Grade Assessment: II - A patient with                            mild systemic disease. After reviewing the risks                            and benefits, the patient was deemed in                            satisfactory condition to undergo the procedure.                           After obtaining informed consent, the colonoscope  was passed under direct vision. Throughout the                            procedure, the patient's blood pressure, pulse, and                            oxygen saturations were monitored continuously. The                            Model CF-HQ190L [id number]) scope was introduced                            through the anus and advanced to the the cecum,   identified by appendiceal orifice and ileocecal                            valve. The ileocecal valve, appendiceal orifice,                            and rectum were photographed. The quality of the                            bowel preparation was excellent. The colonoscopy                            was performed without difficulty. The patient                            tolerated the procedure well. The bowel preparation                            used was SUPREP. Scope In: 8:41:16 AM Scope Out: 8:53:54 AM Scope Withdrawal Time: 0 hours 8 minutes 53 seconds  Total Procedure Duration: 0 hours 12 minutes 38 seconds  Findings:                 A 3 mm polyp was found in the rectum. The polyp was                            removed with a cold snare. Resection and retrieval                            were complete.                           Multiple small-mouthed diverticula were found in                            the sigmoid colon.                           The exam was otherwise without abnormality on                            direct and retroflexion views. Complications:  No immediate complications. Estimated blood loss:                            None. Estimated Blood Loss:     Estimated blood loss: none. Impression:               - One 3 mm polyp in the rectum, removed with a cold                            snare. Resected and retrieved.                           - Diverticulosis in the sigmoid colon.                           - The examination was otherwise normal on direct                            and retroflexion views. Recommendation:           - Repeat colonoscopy in 5-10 years for                            surveillance, pending pathology.                           - Patient has a contact number available for                            emergencies. The signs and symptoms of potential                            delayed complications were discussed with the                             patient. Return to normal activities tomorrow.                            Written discharge instructions were provided to the                            patient.                           - Resume previous diet.                           - Continue present medications.                           - Await pathology results. Docia Chuck. Henrene Pastor, MD 09/30/2015 8:59:33 AM This report has been signed electronically.

## 2015-09-30 NOTE — Progress Notes (Signed)
Report to PACU, RN, vss, BBS= Clear.  

## 2015-10-01 ENCOUNTER — Other Ambulatory Visit: Payer: Self-pay | Admitting: Family Medicine

## 2015-10-01 ENCOUNTER — Telehealth: Payer: Self-pay

## 2015-10-01 DIAGNOSIS — R928 Other abnormal and inconclusive findings on diagnostic imaging of breast: Secondary | ICD-10-CM

## 2015-10-01 NOTE — Telephone Encounter (Signed)
  Follow up Call-  Call back number 09/30/2015  Post procedure Call Back phone  # [phone]  Permission to leave phone message Yes     Patient questions:  Do you have a fever, pain , or abdominal swelling? No. Pain Score  0 *  Have you tolerated food without any problems? Yes.    Have you been able to return to your normal activities? Yes.    Do you have any questions about your discharge instructions: Diet   No. Medications  No. Follow up visit  No.  Do you have questions or concerns about your Care? No.  Actions: * If pain score is 4 or above: No action needed, pain <4.

## 2015-10-08 ENCOUNTER — Encounter: Payer: Self-pay | Admitting: Internal Medicine

## 2015-10-08 ENCOUNTER — Ambulatory Visit
Admission: RE | Admit: 2015-10-08 | Discharge: 2015-10-08 | Disposition: A | Discharge status: Home / Self Care | Payer: Medicare HMO | Source: Ambulatory Visit | Attending: Family Medicine | Admitting: Family Medicine

## 2015-10-08 DIAGNOSIS — R928 Other abnormal and inconclusive findings on diagnostic imaging of breast: Secondary | ICD-10-CM

## 2015-10-09 ENCOUNTER — Telehealth: Payer: Self-pay

## 2015-10-09 MED ORDER — TRIAMTERENE-HCTZ 37.5-25 MG PO TABS: 1.0000 | ORAL_TABLET | Freq: Every day | ORAL | Status: DC

## 2015-10-09 NOTE — Telephone Encounter (Signed)
Pt is requesting a medication refill for Maxzide-25. Thanks!

## 2015-10-09 NOTE — Telephone Encounter (Signed)
Refill sent into pharmacy. Thanks!  

## 2015-11-08 ENCOUNTER — Other Ambulatory Visit: Payer: Self-pay

## 2015-11-08 ENCOUNTER — Telehealth: Payer: Self-pay

## 2015-11-08 MED ORDER — HYDRALAZINE HCL 25 MG PO TABS: 25.0000 mg | ORAL_TABLET | Freq: Three times a day (TID) | ORAL | Status: DC

## 2015-11-08 NOTE — Telephone Encounter (Signed)
Refill for hydralazine sent into pharmacy. Thanks!

## 2015-11-08 NOTE — Telephone Encounter (Signed)
Pt is requesting a medication refill for her Hydralazine. Thanks!

## 2015-11-15 ENCOUNTER — Other Ambulatory Visit: Payer: Self-pay | Admitting: *Deleted

## 2015-11-15 ENCOUNTER — Telehealth: Payer: Self-pay

## 2015-11-15 NOTE — Telephone Encounter (Signed)
Patient verified DOB Patient is aware of refill being placed on 11/08/15 at the [facility] on [address]. MA call to verify the pharmacy has the prescription on file. Pharmacy clarified patients Hydralazine has been filled and is ready for pickup. Pharmacy Tech states she would contact the patient immediately and inform her of the prescription being ready for pick up at this time.  No further questions at this time.

## 2015-12-16 ENCOUNTER — Encounter: Payer: Self-pay | Admitting: Family Medicine

## 2015-12-16 ENCOUNTER — Other Ambulatory Visit: Payer: Self-pay | Admitting: Family Medicine

## 2015-12-16 ENCOUNTER — Ambulatory Visit (INDEPENDENT_AMBULATORY_CARE_PROVIDER_SITE_OTHER): Payer: Medicare HMO | Admitting: Family Medicine

## 2015-12-16 VITALS — BP 133/85 | HR 66 | Temp 98.3°F | Ht 66.0 in | Wt 282.0 lb

## 2015-12-16 DIAGNOSIS — Z9010 Acquired absence of unspecified breast and nipple: Secondary | ICD-10-CM

## 2015-12-16 DIAGNOSIS — I10 Essential (primary) hypertension: Secondary | ICD-10-CM | POA: Diagnosis not present

## 2015-12-16 DIAGNOSIS — Z131 Encounter for screening for diabetes mellitus: Secondary | ICD-10-CM | POA: Diagnosis not present

## 2015-12-16 LAB — COMPLETE METABOLIC PANEL WITH GFR
ALBUMIN: 3.9 g/dL (ref 3.6–5.1)
ALK PHOS: 81 U/L (ref 33–130)
ALT: 9 U/L (ref 6–29)
AST: 12 U/L (ref 10–35)
BUN: 18 mg/dL (ref 7–25)
CALCIUM: 9.5 mg/dL (ref 8.6–10.4)
CHLORIDE: 104 mmol/L (ref 98–110)
CO2: 27 mmol/L (ref 20–31)
Creat: 1.22 mg/dL — ABNORMAL HIGH (ref 0.50–0.99)
GFR, EST AFRICAN AMERICAN: 53 mL/min — AB (ref >=60)
GFR, EST NON AFRICAN AMERICAN: 46 mL/min — AB (ref >=60)
Glucose, Bld: 99 mg/dL (ref 65–99)
POTASSIUM: 4.4 mmol/L (ref 3.5–5.3)
Sodium: 141 mmol/L (ref 135–146)
Total Bilirubin: 0.4 mg/dL (ref 0.2–1.2)
Total Protein: 7.2 g/dL (ref 6.1–8.1)

## 2015-12-16 NOTE — Progress Notes (Signed)
[name] Cooper, is a 65 y.o. female  [id number]  [id number]  DOB - 1950/05/12  CC:  Chief Complaint  Patient presents with  . Follow-up    still has lingering arthritic pain       HPI: [name] Cooper is a 65 y.o. female here for follow-up chronic conditions.She has diagnosis of arthritis, asthma, hypertension, hyperlipidemia, History of breast cancer and surgery 20 years ago. She request a prescription for prosthesis. She reports she still has lingering pain due to arthritis. She has pain in her back, hands and shoulders. She reports smoking 5 cigarettes a day. Denies alcohol and drug use.   She is in need of A1C to screen for diabetes. She reports having a colonoscopy in June She reports her mammogram is up to date. She has had a hysterectomy.She needs Tdap and influenza but we are unable to give today due to a problem with the refrigerator.  Allergies  Allergen Reactions  . Codeine     REACTION: MAKES HER SLEEPY   Past Medical History:  Diagnosis Date  . Arthritis    "down the right side of my body" (09/24/2014)  . Asthma    proair inhaler  . Breast cancer, right breast (HCC)    S/P mastectomy  . Chronic lower back pain   . GERD (gastroesophageal reflux disease)   . 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 Hyperlipemia   . Hypertension   . Slow heart rate dx'd 09/24/2014   Current Outpatient Prescriptions on File Prior to Visit  Medication Sig Dispense Refill  . albuterol (PROVENTIL HFA;VENTOLIN HFA) 108 (90 Base) MCG/ACT inhaler Inhale 2 puffs into the lungs every 6 (six) hours as needed for wheezing or shortness of breath.    . hydrALAZINE (APRESOLINE) 25 MG tablet Take 1 tablet (25 mg total) by mouth 3 (three) times daily. 90 tablet 1  . triamterene-hydrochlorothiazide (MAXZIDE-25) 37.5-25 MG tablet Take 1 tablet by mouth daily. 90 tablet 1  . acetaminophen (TYLENOL) 650 MG CR tablet Take 1,300 mg by mouth every 8 (eight) hours as needed for  pain. Reported on 09/23/2015    . famotidine (PEPCID) 20 MG tablet Take 1 tablet (20 mg total) by mouth 2 (two) times daily. (Patient not taking: Reported on 09/13/2015) 20 tablet 0  . naproxen (NAPROSYN) 500 MG tablet Take 1 tablet (500 mg total) by mouth 2 (two) times daily with a meal. (Patient not taking: Reported on 12/16/2015) 60 tablet 0  . pantoprazole (PROTONIX) 40 MG tablet Take 1 tablet (40 mg total) by mouth daily at 12 noon. (Patient not taking: Reported on 09/23/2015) 90 tablet 1   No current facility-administered medications on file prior to visit.    Family History  Problem Relation Age of Onset  . Colon cancer Neg Hx    Social History   Social History  . Marital status: Widowed    Spouse name: N/A  . Number of children: N/A  . Years of education: N/A   Occupational History  . Not on file.   Social History Main Topics  . Smoking status: Current Every Day Smoker    Packs/day: 0.25    Years: 43.00    Types: Cigarettes  . Smokeless tobacco: Never Used     Comment: 09/24/2014 "I've quit smoking 3 times"  . Alcohol use No  . Drug use: No     Comment: 09/24/2014 "last crack was in 2013"  . Sexual activity: Not on file  Other Topics Concern  . Not on file   Social History Narrative  . No narrative on file    Review of Systems: Constitutional: Negative for fever, chills, appetite change, weight loss,  Fatigue.positive for hot flashes Skin: Negative for rashes or lesions of concern. HENT: Negative for ear pain, ear discharge.nose bleeds. Inflamed feeling in right ear canal Eyes: Negative for pain, discharge, redness, itching and visual disturbance. Neck: Negative for pain, stiffness Respiratory: Positive for some couging wheezing and shortness of breath due to asthma Cardiovascular: Negative for chest pain, palpitations. Occ swelling of lower legs Gastrointestinal: Negative for abdominal pain, nausea, vomiting, diarrhea, constipations Genitourinary: Negative for  dysuria, urgency, frequency, hematuria,  Musculoskeletal: Positive for back, hand and shoulder pain Neurological: Negative for dizziness, tremors, seizures, syncope,   light-headedness, numbness and headaches.  Hematological: Negative for easy bruising or bleeding Psychiatric/Behavioral: Negative for depression, anxiety, decreased concentration, confusion   Objective:   Vitals:   12/16/15 1051  BP: 133/85  Pulse: 66  Temp: 98.3 F (36.8 C)    Physical Exam: Constitutional: Patient appears well-developed and well-nourished. No distress. HENT: Normocephalic, atraumatic, External right and left ear normal. Oropharynx is clear and moist.  Eyes: Conjunctivae and EOM are normal. PERRLA, no scleral icterus. Neck: Normal ROM. Neck supple. No lymphadenopathy, No thyromegaly. CVS: RRR, S1/S2 +, no murmurs, no gallops, no rubs Pulmonary: Effort and breath sounds normal, no stridor, rhonchi, wheezes, rales.  Abdominal: Soft. Normoactive BS,, no distension, tenderness, rebound or guarding.  Musculoskeletal: Normal range of motion. No edema and no tenderness.  Neuro: Alert.Normal muscle tone coordination. Non-focal Skin: Skin is warm and dry. No rash noted. Not diaphoretic. No erythema. No pallor. Psychiatric: Normal mood and affect. Behavior, judgment, thought content normal.  Lab Results  Component Value Date   WBC 8.3 09/13/2015   HGB 11.5 (L) 09/13/2015   HCT 36.7 09/13/2015   MCV 67.0 (L) 09/13/2015   PLT 285 09/13/2015   Lab Results  Component Value Date   CREATININE 1.25 (H) 09/13/2015   BUN 21 09/13/2015   NA 140 09/13/2015   K 4.5 09/13/2015   CL 103 09/13/2015   CO2 26 09/13/2015    Lab Results  Component Value Date   HGBA1C 6.3 (H) 09/24/2014   Lipid Panel     Component Value Date/Time   CHOL 220 (H) 09/13/2015 1343   TRIG 82 09/13/2015 1343   HDL 70 09/13/2015 1343   CHOLHDL 3.1 09/13/2015 1343   VLDL 16 09/13/2015 1343   LDLCALC 134 (H) 09/13/2015 1343        Assessment and plan:   1. Screening for diabetes mellitus 2. Essential hypertension  - Hemoglobin A1c - COMPLETE METABOLIC PANEL WITH GFR - CBC with Differential   Return in about 3 months (around 03/17/2016).  The patient was given clear instructions to go to ER or return to medical center if symptoms don't improve, worsen or new problems develop. The patient verbalized understanding.    Micheline Chapman FNP  12/16/2015, 3:06 PM

## 2015-12-17 LAB — HEMOGLOBIN A1C
HEMOGLOBIN A1C: 6.1 % — AB (ref <5.7)
Mean Plasma Glucose: 128 mg/dL

## 2015-12-17 LAB — CBC WITH DIFFERENTIAL/PLATELET
BASOS PCT: 1 %
Basophils Absolute: 95 cells/uL (ref 0–200)
EOS PCT: 8 %
Eosinophils Absolute: 760 cells/uL — ABNORMAL HIGH (ref 15–500)
HCT: 37.8 % (ref 35.0–45.0)
HEMOGLOBIN: 11.8 g/dL (ref 11.7–15.5)
LYMPHS ABS: 3705 {cells}/uL (ref 850–3900)
Lymphocytes Relative: 39 %
MCH: 21 pg — AB (ref 27.0–33.0)
MCHC: 31.2 g/dL — ABNORMAL LOW (ref 32.0–36.0)
MCV: 67.3 fL — ABNORMAL LOW (ref 80.0–100.0)
MONOS PCT: 9 %
MPV: 9.9 fL (ref 7.5–12.5)
Monocytes Absolute: 855 cells/uL (ref 200–950)
NEUTROS ABS: 4085 {cells}/uL (ref 1500–7800)
Neutrophils Relative %: 43 %
PLATELETS: 309 10*3/uL (ref 140–400)
RBC: 5.62 MIL/uL — AB (ref 3.80–5.10)
RDW: 18.6 % — ABNORMAL HIGH (ref 11.0–15.0)
WBC: 9.5 10*3/uL (ref 3.8–10.8)

## 2016-01-03 LAB — GLUCOSE, POCT (MANUAL RESULT ENTRY): POC Glucose: 113 mg/dl — AB (ref 70–99)

## 2016-01-16 ENCOUNTER — Telehealth: Payer: Self-pay

## 2016-01-16 MED ORDER — HYDRALAZINE HCL 25 MG PO TABS: 25.0000 mg | ORAL_TABLET | Freq: Three times a day (TID) | ORAL | 1 refills | Status: DC

## 2016-01-16 NOTE — Telephone Encounter (Signed)
Refill for hydralazine sent into pharmacy. Thanks!

## 2016-03-03 ENCOUNTER — Other Ambulatory Visit: Payer: Self-pay | Admitting: Family Medicine

## 2016-03-03 DIAGNOSIS — N63 Unspecified lump in unspecified breast: Secondary | ICD-10-CM

## 2016-03-16 ENCOUNTER — Other Ambulatory Visit: Payer: Self-pay

## 2016-03-16 MED ORDER — HYDRALAZINE HCL 25 MG PO TABS: 25.0000 mg | ORAL_TABLET | Freq: Three times a day (TID) | ORAL | 1 refills | Status: DC

## 2016-03-16 MED ORDER — TRIAMTERENE-HCTZ 37.5-25 MG PO TABS: 1.0000 | ORAL_TABLET | Freq: Every day | ORAL | 1 refills | Status: DC

## 2016-03-16 NOTE — Telephone Encounter (Signed)
Refills sent into [facility] mail order. Thanks!

## 2016-03-17 ENCOUNTER — Ambulatory Visit (INDEPENDENT_AMBULATORY_CARE_PROVIDER_SITE_OTHER): Payer: Medicare HMO | Admitting: Family Medicine

## 2016-03-17 ENCOUNTER — Other Ambulatory Visit: Payer: Self-pay | Admitting: Family Medicine

## 2016-03-17 ENCOUNTER — Encounter: Payer: Self-pay | Admitting: Family Medicine

## 2016-03-17 VITALS — BP 135/75 | HR 58 | Temp 97.7°F | Resp 18 | Ht 66.0 in | Wt 292.0 lb

## 2016-03-17 DIAGNOSIS — I10 Essential (primary) hypertension: Secondary | ICD-10-CM | POA: Diagnosis not present

## 2016-03-17 DIAGNOSIS — Z114 Encounter for screening for human immunodeficiency virus [HIV]: Secondary | ICD-10-CM

## 2016-03-17 DIAGNOSIS — Z13820 Encounter for screening for osteoporosis: Secondary | ICD-10-CM | POA: Diagnosis not present

## 2016-03-17 DIAGNOSIS — Z23 Encounter for immunization: Secondary | ICD-10-CM | POA: Diagnosis not present

## 2016-03-17 LAB — CBC WITH DIFFERENTIAL/PLATELET
BASOS ABS: 95 {cells}/uL (ref 0–200)
BASOS PCT: 1 %
EOS PCT: 7 %
Eosinophils Absolute: 665 cells/uL — ABNORMAL HIGH (ref 15–500)
HCT: 38.3 % (ref 35.0–45.0)
Hemoglobin: 11.6 g/dL — ABNORMAL LOW (ref 11.7–15.5)
LYMPHS PCT: 33 %
Lymphs Abs: 3135 cells/uL (ref 850–3900)
MCH: 20.7 pg — AB (ref 27.0–33.0)
MCHC: 30.3 g/dL — ABNORMAL LOW (ref 32.0–36.0)
MCV: 68.3 fL — AB (ref 80.0–100.0)
MONOS PCT: 8 %
MPV: 9.9 fL (ref 7.5–12.5)
Monocytes Absolute: 760 cells/uL (ref 200–950)
NEUTROS ABS: 4845 {cells}/uL (ref 1500–7800)
Neutrophils Relative %: 51 %
PLATELETS: 308 10*3/uL (ref 140–400)
RBC: 5.61 MIL/uL — ABNORMAL HIGH (ref 3.80–5.10)
RDW: 19.1 % — AB (ref 11.0–15.0)
WBC: 9.5 10*3/uL (ref 3.8–10.8)

## 2016-03-17 LAB — LIPID PANEL
CHOL/HDL RATIO: 3.5 ratio (ref <5.0)
CHOLESTEROL: 230 mg/dL — AB (ref <200)
HDL: 65 mg/dL (ref >50)
LDL Cholesterol: 143 mg/dL — ABNORMAL HIGH (ref <100)
Triglycerides: 112 mg/dL (ref <150)
VLDL: 22 mg/dL (ref <30)

## 2016-03-17 LAB — COMPLETE METABOLIC PANEL WITH GFR
ALBUMIN: 3.9 g/dL (ref 3.6–5.1)
ALK PHOS: 74 U/L (ref 33–130)
ALT: 8 U/L (ref 6–29)
AST: 13 U/L (ref 10–35)
BILIRUBIN TOTAL: 0.4 mg/dL (ref 0.2–1.2)
BUN: 19 mg/dL (ref 7–25)
CO2: 25 mmol/L (ref 20–31)
CREATININE: 1.23 mg/dL — AB (ref 0.50–0.99)
Calcium: 9.4 mg/dL (ref 8.6–10.4)
Chloride: 104 mmol/L (ref 98–110)
GFR, EST AFRICAN AMERICAN: 53 mL/min — AB (ref >=60)
GFR, EST NON AFRICAN AMERICAN: 46 mL/min — AB (ref >=60)
Glucose, Bld: 92 mg/dL (ref 65–99)
Potassium: 4.2 mmol/L (ref 3.5–5.3)
Sodium: 140 mmol/L (ref 135–146)
TOTAL PROTEIN: 7.5 g/dL (ref 6.1–8.1)

## 2016-03-17 LAB — TSH: TSH: 1.94 m[IU]/L

## 2016-03-17 MED ORDER — PNEUMOCOCCAL 13-VAL CONJ VACC IM SUSP
0.5000 mL | INTRAMUSCULAR | Status: AC
  Administered 2016-03-17: 0.5 mL via INTRAMUSCULAR

## 2016-03-17 NOTE — Progress Notes (Signed)
[name] Cooper, is a 65 y.o. female  [id number]  [id number]  DOB - March 02, 1951  CC:  Chief Complaint  Patient presents with  . Hypertension  . Follow-up       HPI: [name] Cooper is a 65 y.o. female here  for follow-up hypertension. She also has a history of asthma and arthritis. She has a history of breast cancer on the right and request order for prostheasis.  She has a follow-up mammogram of left breast scheduled in January. Her blood pressure medications were refilled yesterday. She reports no longer using protonix, pepcid, or naproxen. She does not need a refill of albuterol. She reports following a low salt diet, does not exercise regularly.  Health Maintenance: to receive influenza and Prevnar today. Will wail on Tdap. She needs screening for HIV. She reports having colonoscopy in June 2016. She does request a bone density. She reports stopping smoking recently and denies alcohol and drug use.  s Allergies  Allergen Reactions  . Codeine     REACTION: MAKES HER SLEEPY   Past Medical History:  Diagnosis Date  . Arthritis    "down the right side of my body" (09/24/2014)  . Asthma    proair inhaler  . Breast cancer, right breast (HCC)    S/P mastectomy  . Chronic lower back pain   . GERD (gastroesophageal reflux disease)   . 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 Hyperlipemia   . Hypertension   . Slow heart rate dx'd 09/24/2014   Current Outpatient Prescriptions on File Prior to Visit  Medication Sig Dispense Refill  . acetaminophen (TYLENOL) 650 MG CR tablet Take 1,300 mg by mouth every 8 (eight) hours as needed for pain. Reported on 09/23/2015    . albuterol (PROVENTIL HFA;VENTOLIN HFA) 108 (90 Base) MCG/ACT inhaler Inhale 2 puffs into the lungs every 6 (six) hours as needed for wheezing or shortness of breath.    . hydrALAZINE (APRESOLINE) 25 MG tablet Take 1 tablet (25 mg total) by mouth 3 (three) times daily. 270 tablet 1  .  triamterene-hydrochlorothiazide (MAXZIDE-25) 37.5-25 MG tablet Take 1 tablet by mouth daily. 90 tablet 1  . famotidine (PEPCID) 20 MG tablet Take 1 tablet (20 mg total) by mouth 2 (two) times daily. (Patient not taking: Reported on 03/17/2016) 20 tablet 0  . naproxen (NAPROSYN) 500 MG tablet Take 1 tablet (500 mg total) by mouth 2 (two) times daily with a meal. (Patient not taking: Reported on 03/17/2016) 60 tablet 0  . pantoprazole (PROTONIX) 40 MG tablet Take 1 tablet (40 mg total) by mouth daily at 12 noon. (Patient not taking: Reported on 03/17/2016) 90 tablet 1   No current facility-administered medications on file prior to visit.    Family History  Problem Relation Age of Onset  . Colon cancer Neg Hx    Social History   Social History  . Marital status: Widowed    Spouse name: N/A  . Number of children: N/A  . Years of education: N/A   Occupational History  . Not on file.   Social History Main Topics  . Smoking status: Current Every Day Smoker    Packs/day: 0.25    Years: 43.00    Types: Cigarettes  . Smokeless tobacco: Never Used     Comment: 09/24/2014 "I've quit smoking 3 times"  . Alcohol use No  . Drug use: No     Comment: 09/24/2014 "last crack was in 2013"  . Sexual  activity: Not on file   Other Topics Concern  . Not on file   Social History Narrative  . No narrative on file    Review of Systems: Constitutional: Negative Skin: Negative HENT: Negative  Eyes: Negative  Neck: Negative Respiratory: Negative Cardiovascular: +occassional swelling of feet and legs Gastrointestinal: Negative Genitourinary: + for some minor incontinence.has a bladder sling.  Musculoskeletal: + for left shoulder pain Neurological: Negative for Hematological: Negative  Psychiatric/Behavioral: Negative    Objective:   Vitals:   03/17/16 1027  BP: 135/75  Pulse: (!) 58  Resp: 18  Temp: 97.7 F (36.5 C)    Physical Exam: Constitutional: Patient appears well-developed  and well-nourished. No distress. HENT: Normocephalic, atraumatic, External right and left ear normal. Oropharynx is clear and moist.  Eyes: Conjunctivae and EOM are normal. PERRLA, no scleral icterus. Neck: Normal ROM. Neck supple. No lymphadenopathy, No thyromegaly. CVS: RRR, S1/S2 +, no murmurs, no gallops, no rubs Pulmonary: Effort and breath sounds normal, no stridor, rhonchi, wheezes, rales.  Abdominal: Soft. Normoactive BS,, no distension, tenderness, rebound or guarding.  Musculoskeletal: Normal range of motion. No edema and no tenderness.  Neuro: Alert.Normal muscle tone coordination. Non-focal Skin: Skin is warm and dry. No rash noted. Not diaphoretic. No erythema. No pallor. Psychiatric: Normal mood and affect. Behavior, judgment, thought content normal.  Lab Results  Component Value Date   WBC 9.5 12/16/2015   HGB 11.8 12/16/2015   HCT 37.8 12/16/2015   MCV 67.3 (L) 12/16/2015   PLT 309 12/16/2015   Lab Results  Component Value Date   CREATININE 1.22 (H) 12/16/2015   BUN 18 12/16/2015   NA 141 12/16/2015   K 4.4 12/16/2015   CL 104 12/16/2015   CO2 27 12/16/2015    Lab Results  Component Value Date   HGBA1C 6.1 (H) 12/16/2015   Lipid Panel     Component Value Date/Time   CHOL 220 (H) 09/13/2015 1343   TRIG 82 09/13/2015 1343   HDL 70 09/13/2015 1343   CHOLHDL 3.1 09/13/2015 1343   VLDL 16 09/13/2015 1343   LDLCALC 134 (H) 09/13/2015 1343        Assessment and plan:   1. Need for prophylactic vaccination against Streptococcus pneumoniae (pneumococcus)  - pneumococcal 13-valent conjugate vaccine (PREVNAR 13) injection 0.5 mL; Inject 0.5 mLs into the muscle tomorrow at 10 am.  2. Need for prophylactic vaccination and inoculation against influenza  - Flu Vaccine QUAD 36+ mos PF IM (Fluarix & Fluzone Quad PF)  3. Screening for osteoporosis  - DG Bone Density; Future  4. Essential hypertension  - COMPLETE METABOLIC PANEL WITH GFR - CBC with  Differential - Lipid panel - TSH  5. Screening for HIV (human immunodeficiency virus)  - HIV antibody (with reflex)   No Follow-up on file.  The patient was given clear instructions to go to ER or return to medical center if symptoms don't improve, worsen or new problems develop. The patient verbalized understanding.    Micheline Chapman FNP  03/17/2016, 11:21 AM

## 2016-03-17 NOTE — Patient Instructions (Signed)
I will check on and order prosthesis. Continue current treatment Follow-up in 6 months and as needed.

## 2016-03-18 ENCOUNTER — Other Ambulatory Visit: Payer: Self-pay | Admitting: Family Medicine

## 2016-03-18 LAB — HIV ANTIBODY (ROUTINE TESTING W REFLEX): HIV: NONREACTIVE

## 2016-03-18 MED ORDER — PRAVASTATIN SODIUM 40 MG PO TABS: 40.0000 mg | ORAL_TABLET | Freq: Every day | ORAL | 3 refills | Status: DC

## 2016-03-23 ENCOUNTER — Telehealth: Payer: Self-pay | Admitting: Family Medicine

## 2016-03-23 LAB — ESTRADIOL: ESTRADIOL: 28 pg/mL

## 2016-03-23 NOTE — Telephone Encounter (Signed)
Asked [facility] to add estradiol level to labwork per Sharon Seller, NP.

## 2016-03-30 DIAGNOSIS — E2839 Other primary ovarian failure: Secondary | ICD-10-CM | POA: Insufficient documentation

## 2016-04-06 ENCOUNTER — Other Ambulatory Visit: Payer: Self-pay | Admitting: Family Medicine

## 2016-04-09 ENCOUNTER — Ambulatory Visit
Admission: RE | Admit: 2016-04-09 | Discharge: 2016-04-09 | Disposition: A | Discharge status: Home / Self Care | Payer: Medicare HMO | Source: Ambulatory Visit | Attending: Family Medicine | Admitting: Family Medicine

## 2016-04-09 DIAGNOSIS — N63 Unspecified lump in unspecified breast: Secondary | ICD-10-CM

## 2016-04-27 ENCOUNTER — Other Ambulatory Visit: Payer: Self-pay | Admitting: Family Medicine

## 2016-04-27 DIAGNOSIS — E2839 Other primary ovarian failure: Secondary | ICD-10-CM

## 2016-05-06 ENCOUNTER — Other Ambulatory Visit: Payer: Medicare HMO

## 2016-05-12 ENCOUNTER — Ambulatory Visit
Admission: RE | Admit: 2016-05-12 | Discharge: 2016-05-12 | Disposition: A | Discharge status: Home / Self Care | Payer: Medicare HMO | Source: Ambulatory Visit | Attending: Family Medicine | Admitting: Family Medicine

## 2016-05-12 DIAGNOSIS — E2839 Other primary ovarian failure: Secondary | ICD-10-CM

## 2016-05-26 ENCOUNTER — Telehealth: Payer: Self-pay

## 2016-05-26 NOTE — Telephone Encounter (Signed)
Spoke with patient, advised of results being normal for age. Advised to take 1200mg  of calcium daily and 800 units of vitamin D daily. Thanks!

## 2016-05-26 NOTE — Telephone Encounter (Signed)
-----   Message from Micheline Chapman, NP sent at 05/25/2016  4:23 PM EST ----- Bone density study normal for age. The recommendation is to take 1200 of Calcium daily and Vitamin D 800 daily.

## 2016-08-19 ENCOUNTER — Other Ambulatory Visit: Payer: Self-pay | Admitting: Family Medicine

## 2016-09-21 ENCOUNTER — Encounter: Payer: Self-pay | Admitting: Family Medicine

## 2016-09-21 ENCOUNTER — Ambulatory Visit (INDEPENDENT_AMBULATORY_CARE_PROVIDER_SITE_OTHER): Payer: Medicare HMO | Admitting: Family Medicine

## 2016-09-21 ENCOUNTER — Other Ambulatory Visit: Payer: Self-pay | Admitting: Family Medicine

## 2016-09-21 VITALS — BP 128/77 | HR 74 | Temp 97.9°F | Resp 17 | Ht 66.0 in | Wt 297.0 lb

## 2016-09-21 DIAGNOSIS — I10 Essential (primary) hypertension: Secondary | ICD-10-CM | POA: Diagnosis not present

## 2016-09-21 DIAGNOSIS — M7501 Adhesive capsulitis of right shoulder: Secondary | ICD-10-CM

## 2016-09-21 DIAGNOSIS — Z794 Long term (current) use of insulin: Secondary | ICD-10-CM

## 2016-09-21 DIAGNOSIS — R9431 Abnormal electrocardiogram [ECG] [EKG]: Secondary | ICD-10-CM | POA: Diagnosis not present

## 2016-09-21 DIAGNOSIS — R079 Chest pain, unspecified: Secondary | ICD-10-CM | POA: Diagnosis not present

## 2016-09-21 DIAGNOSIS — N3000 Acute cystitis without hematuria: Secondary | ICD-10-CM | POA: Diagnosis not present

## 2016-09-21 DIAGNOSIS — Z853 Personal history of malignant neoplasm of breast: Secondary | ICD-10-CM | POA: Diagnosis not present

## 2016-09-21 DIAGNOSIS — E119 Type 2 diabetes mellitus without complications: Secondary | ICD-10-CM | POA: Diagnosis not present

## 2016-09-21 LAB — CBC WITH DIFFERENTIAL/PLATELET
BASOS ABS: 99 {cells}/uL (ref 0–200)
Basophils Relative: 1 %
EOS PCT: 6 %
Eosinophils Absolute: 594 cells/uL — ABNORMAL HIGH (ref 15–500)
HEMATOCRIT: 36.4 % (ref 35.0–45.0)
HEMOGLOBIN: 11.4 g/dL — AB (ref 11.7–15.5)
LYMPHS ABS: 3663 {cells}/uL (ref 850–3900)
LYMPHS PCT: 37 %
MCH: 21.4 pg — AB (ref 27.0–33.0)
MCHC: 31.3 g/dL — AB (ref 32.0–36.0)
MCV: 68.4 fL — ABNORMAL LOW (ref 80.0–100.0)
MPV: 9.7 fL (ref 7.5–12.5)
Monocytes Absolute: 792 cells/uL (ref 200–950)
Monocytes Relative: 8 %
NEUTROS PCT: 48 %
Neutro Abs: 4752 cells/uL (ref 1500–7800)
Platelets: 294 10*3/uL (ref 140–400)
RBC: 5.32 MIL/uL — ABNORMAL HIGH (ref 3.80–5.10)
RDW: 18.2 % — AB (ref 11.0–15.0)
WBC: 9.9 10*3/uL (ref 3.8–10.8)

## 2016-09-21 LAB — POCT URINALYSIS DIP (DEVICE)
Bilirubin Urine: NEGATIVE
GLUCOSE, UA: NEGATIVE mg/dL
Hgb urine dipstick: NEGATIVE
Ketones, ur: NEGATIVE mg/dL
NITRITE: POSITIVE — AB
PROTEIN: NEGATIVE mg/dL
SPECIFIC GRAVITY, URINE: 1.015 (ref 1.005–1.030)
UROBILINOGEN UA: 0.2 mg/dL (ref 0.0–1.0)
pH: 7 (ref 5.0–8.0)

## 2016-09-21 LAB — POCT GLYCOSYLATED HEMOGLOBIN (HGB A1C): Hemoglobin A1C: 6.4

## 2016-09-21 MED ORDER — TRAMADOL HCL 50 MG PO TABS: 50.0000 mg | ORAL_TABLET | Freq: Two times a day (BID) | ORAL | 0 refills | Status: DC | PRN

## 2016-09-21 MED ORDER — KETOROLAC TROMETHAMINE 60 MG/2ML IM SOLN
60.0000 mg | Freq: Once | INTRAMUSCULAR | Status: AC
  Administered 2016-09-21: 60 mg via INTRAMUSCULAR

## 2016-09-21 MED ORDER — ALBUTEROL SULFATE HFA 108 (90 BASE) MCG/ACT IN AERS: 2.0000 | INHALATION_SPRAY | Freq: Four times a day (QID) | RESPIRATORY_TRACT | 3 refills | Status: DC | PRN

## 2016-09-21 MED ORDER — BUDESONIDE-FORMOTEROL FUMARATE 80-4.5 MCG/ACT IN AERO: 2.0000 | INHALATION_SPRAY | Freq: Two times a day (BID) | RESPIRATORY_TRACT | 3 refills | Status: DC

## 2016-09-21 MED ORDER — METFORMIN HCL 500 MG PO TABS: 500.0000 mg | ORAL_TABLET | Freq: Two times a day (BID) | ORAL | 3 refills | Status: DC

## 2016-09-21 MED ORDER — DOXYCYCLINE HYCLATE 100 MG PO CAPS: 100.0000 mg | ORAL_CAPSULE | Freq: Two times a day (BID) | ORAL | 0 refills | Status: DC

## 2016-09-21 NOTE — Progress Notes (Signed)
Patient ID: [name] Cooper, female    DOB: 08/26/1950, 66 y.o.   MRN: [id number]  PCP: Scot Jun, FNP   Chief Complaint  Patient presents with  . Follow-up    Blood pressure, cholesterol  . Shoulder Pain    right  . Medication Refill    inhaler    Subjective:  HPI [name] Cooper is a 66 y.o. female presents for routine 6 month follow-up.  Medical problems include: Hypertension, Right BCA with mastectomy, Former Smoker, COPD   Right shoulder pain x 3 months.  Unable to flex or adduct right shoulder.Hx of right mastectomy .She has taken tylenol only for pain with minimal relief. Denies any prior straining injuries. With cervical neck rotation, she reports a "pulling" with lateral  rotation to left, pulling sensation is noted in the trapezius neck muscle.   Area of ser  Hypertension Routinely monitors and logs blood pressure readings at home. She has a wrist blood pressure monitor. Blood pressure reading at home a little high 130-140/80-90. Concern that blood pressure is uncontrolled. Reports occasional chest pain and sensation of "Flutters" in chest occasionally. When either event occurs It is accompanied by strenuous activity or walking a long distance. She also complains of associated shortness of  Breath, however this is a chronic problems stemming from a long history of smoking. Reports a chronic productive cough, with clear sputum production. Dizziness on with sudden movements.Over 3 weeks ago, she reports standing on her porch and she fell. Denies any syncopal episode, however has no idea as to what provoked her to fall. She quit smoking 6 month ago "cold [place]" and has not had any desire to resume. She has occasional headaches and most recently last week and resolves with Tylenol and rest. Frequency of headaches,  is around 2-3 per month and not every month.  Social History   Social History  . Marital status: Widowed    Spouse name: N/A  . Number of children:  N/A  . Years of education: N/A   Occupational History  . Not on file.   Social History Main Topics  . Smoking status: Current Every Day Smoker    Packs/day: 0.25    Years: 43.00    Types: Cigarettes  . Smokeless tobacco: Never Used     Comment: 09/24/2014 "I've quit smoking 3 times"  . Alcohol use No  . Drug use: No     Comment: 09/24/2014 "last crack was in 2013"  . Sexual activity: Not on file   Other Topics Concern  . Not on file   Social History Narrative  . No narrative on file    Family History  Problem Relation Age of Onset  . Colon cancer Neg Hx    Review of Systems  SEE HPI   Patient Active Problem List   Diagnosis Date Noted  . Estrogen deficiency 03/30/2016  . Rhinitis, allergic   . Pre-diabetes   . Angioedema 09/24/2014  . HTN (hypertension) 09/24/2014  . HLD (hyperlipidemia) 09/24/2014  . GERD (gastroesophageal reflux disease) 09/24/2014  . OA (osteoarthritis) 09/24/2014  . Swallowing difficulty   . PULMONARY NODULE 03/21/2008  . CHEST PAIN 03/21/2008  . NEOPLASM, MALIGNANT, BREAST, HX OF 03/21/2008    Allergies  Allergen Reactions  . Codeine     REACTION: MAKES HER SLEEPY    Prior to Admission medications   Medication Sig Start Date End Date Taking? Authorizing Provider  acetaminophen (TYLENOL) 650 MG CR tablet Take 1,300 mg by mouth every 8 (  eight) hours as needed for pain. Reported on 09/23/2015    [provider]  albuterol (PROVENTIL HFA;VENTOLIN HFA) 108 (90 Base) MCG/ACT inhaler Inhale 2 puffs into the lungs every 6 (six) hours as needed for wheezing or shortness of breath.    [provider]  famotidine (PEPCID) 20 MG tablet Take 1 tablet (20 mg total) by mouth 2 (two) times daily. Patient not taking: Reported on 03/17/2016 09/25/14   Barton Dubois, MD  hydrALAZINE (APRESOLINE) 25 MG tablet TAKE 1 TABLET THREE TIMES DAILY 08/19/16   Scot Jun, FNP  naproxen (NAPROSYN) 500 MG tablet Take 1 tablet (500 mg total) by  mouth 2 (two) times daily with a meal. Patient not taking: Reported on 03/17/2016 09/13/15   Micheline Chapman, NP  pantoprazole (PROTONIX) 40 MG tablet Take 1 tablet (40 mg total) by mouth daily at 12 noon. Patient not taking: Reported on 03/17/2016 09/13/15   Micheline Chapman, NP  pravastatin (PRAVACHOL) 40 MG tablet Take 1 tablet (40 mg total) by mouth daily. 03/18/16   Micheline Chapman, NP  triamterene-hydrochlorothiazide (MAXZIDE-25) 37.5-25 MG tablet TAKE 1 TABLET EVERY DAY 08/19/16   Scot Jun, FNP    Past Medical, Surgical Family and Social History reviewed and updated.    Objective:   Vitals:   09/21/16 1054  BP: 128/77  Pulse: 74  Resp: 17  Temp: 97.9 F (36.6 C)    Wt Readings from Last 3 Encounters:  03/17/16 292 lb (132.5 kg)  12/16/15 282 lb (127.9 kg)  09/30/15 288 lb (130.6 kg)   Physical Exam  Constitutional: She is oriented to person, place, and time. She appears well-developed and well-nourished.  HENT:  Head: Normocephalic and atraumatic.  Eyes: Conjunctivae are normal. Pupils are equal, round, and reactive to light.  Neck: Normal range of motion. Neck supple.  Cardiovascular: Normal rate, regular rhythm, normal heart sounds and intact distal pulses.   Pulmonary/Chest: Effort normal and breath sounds normal.  Musculoskeletal: She exhibits edema.       Right shoulder: She exhibits decreased range of motion, tenderness, bony tenderness and decreased strength.  Bilateral leg edema  Neurological: She is alert and oriented to person, place, and time.  Skin: Skin is warm and dry.  Psychiatric: She has a normal mood and affect. Her behavior is normal. Judgment and thought content normal.     Assessment & Plan:  1. Type 2 diabetes mellitus without complication, with long-term current use of insulin (HCC) - POCT glycosylated hemoglobin (Hb A1C)-6.4  - COMPLETE METABOLIC PANEL WITH GFR -Start Metformin 500 mg BID with meals  2. Essential  hypertension, controlled-stable  - CBC with Differential - Thyroid Panel With TSH - Lipid panel -Blood pressure is controlled today, no changes in medication therapy.  3. Adhesive capsulitis of right shoulder - AMB referral to orthopedics - ketorolac (TORADOL) injection 60 mg; Inject 2 mLs (60 mg total) into the muscle once. -Tramadol 50-100 mg every 12 hours as needed for pain.  4. Chest pain, unspecified type - EKG 12-Lead  5. Acute cystitis without hematuria -Doxycyline 100 mg twice daily x 10 days. - Urine culture  6. Abnormal EKG - Ambulatory referral to Cardiology  7. History of right breast cancer - DME-Right Breast prosthesis   All current medications refilled.   RTC: 3 months follow-up chronic disease management    Carroll Sage. Kenton Kingfisher, MSN, FNP-C [facility]  [address] [place], [place] [address] [phone]

## 2016-09-21 NOTE — Patient Instructions (Addendum)
I have referred you to orthopedics for your shoulder and cardiology for further evaluation of chest fluttering and shortness of breath.  Urinary Tract Infection -Doxycycline 100 mg twice daily with meals for 10 day . Complete all medication   Diabetes Start Metformin 500 mg twice daily with meals. I will recheck your A1C in 3 months. No need to check your blood sugar at home, however I encourage you to walk 15-20 minutes daily to improve glycemic control.    Diabetes Mellitus and Food It is important for you to manage your blood sugar (glucose) level. Your blood glucose level can be greatly affected by what you eat. Eating healthier foods in the appropriate amounts throughout the day at about the same time each day will help you control your blood glucose level. It can also help slow or prevent worsening of your diabetes mellitus. Healthy eating may even help you improve the level of your blood pressure and reach or maintain a healthy weight. General recommendations for healthful eating and cooking habits include:  Eating meals and snacks regularly. Avoid going long periods of time without eating to lose weight.  Eating a diet that consists mainly of plant-based foods, such as fruits, vegetables, nuts, legumes, and whole grains.  Using low-heat cooking methods, such as baking, instead of high-heat cooking methods, such as deep frying.  Work with your dietitian to make sure you understand how to use the Nutrition Facts information on food labels. How can food affect me? Carbohydrates Carbohydrates affect your blood glucose level more than any other type of food. Your dietitian will help you determine how many carbohydrates to eat at each meal and teach you how to count carbohydrates. Counting carbohydrates is important to keep your blood glucose at a healthy level, especially if you are using insulin or taking certain medicines for diabetes mellitus. Alcohol Alcohol can cause sudden  decreases in blood glucose (hypoglycemia), especially if you use insulin or take certain medicines for diabetes mellitus. Hypoglycemia can be a life-threatening condition. Symptoms of hypoglycemia (sleepiness, dizziness, and disorientation) are similar to symptoms of having too much alcohol. If your health care provider has given you approval to drink alcohol, do so in moderation and use the following guidelines:  Women should not have more than one drink per day, and men should not have more than two drinks per day. One drink is equal to: ? 12 oz of beer. ? 5 oz of wine. ? 1 oz of hard liquor.  Do not drink on an empty stomach.  Keep yourself hydrated. Have water, diet soda, or unsweetened iced tea.  Regular soda, juice, and other mixers might contain a lot of carbohydrates and should be counted.  What foods are not recommended? As you make food choices, it is important to remember that all foods are not the same. Some foods have fewer nutrients per serving than other foods, even though they might have the same number of calories or carbohydrates. It is difficult to get your body what it needs when you eat foods with fewer nutrients. Examples of foods that you should avoid that are high in calories and carbohydrates but low in nutrients include:  Trans fats (most processed foods list trans fats on the Nutrition Facts label).  Regular soda.  Juice.  Candy.  Sweets, such as cake, pie, doughnuts, and cookies.  Fried foods.  What foods can I eat? Eat nutrient-rich foods, which will nourish your body and keep you healthy. The food you should eat also  will depend on several factors, including:  The calories you need.  The medicines you take.  Your weight.  Your blood glucose level.  Your blood pressure level.  Your cholesterol level.  You should eat a variety of foods, including:  Protein. ? Lean cuts of meat. ? Proteins low in saturated fats, such as fish, egg whites, and  beans. Avoid processed meats.  Fruits and vegetables. ? Fruits and vegetables that may help control blood glucose levels, such as apples, mangoes, and yams.  Dairy products. ? Choose fat-free or low-fat dairy products, such as milk, yogurt, and cheese.  Grains, bread, pasta, and rice. ? Choose whole grain products, such as multigrain bread, whole oats, and brown rice. These foods may help control blood pressure.  Fats. ? Foods containing healthful fats, such as nuts, avocado, olive oil, canola oil, and fish.  Does everyone with diabetes mellitus have the same meal plan? Because every person with diabetes mellitus is different, there is not one meal plan that works for everyone. It is very important that you meet with a dietitian who will help you create a meal plan that is just right for you. This information is not intended to replace advice given to you by your health care provider. Make sure you discuss any questions you have with your health care provider. Document Released: 01/01/2005 Document Revised: 09/12/2015 Document Reviewed: 03/03/2013 Elsevier Interactive Patient Education  2017 [employer].

## 2016-09-22 ENCOUNTER — Encounter: Payer: Self-pay | Admitting: Cardiology

## 2016-09-22 LAB — LIPID PANEL
CHOL/HDL RATIO: 3.1 ratio (ref <5.0)
CHOLESTEROL: 196 mg/dL (ref <200)
HDL: 63 mg/dL (ref >50)
LDL CALC: 107 mg/dL — AB (ref <100)
TRIGLYCERIDES: 131 mg/dL (ref <150)
VLDL: 26 mg/dL (ref <30)

## 2016-09-22 LAB — COMPLETE METABOLIC PANEL WITH GFR
ALBUMIN: 3.9 g/dL (ref 3.6–5.1)
ALT: 14 U/L (ref 6–29)
AST: 18 U/L (ref 10–35)
Alkaline Phosphatase: 78 U/L (ref 33–130)
BILIRUBIN TOTAL: 0.7 mg/dL (ref 0.2–1.2)
BUN: 21 mg/dL (ref 7–25)
CALCIUM: 9.5 mg/dL (ref 8.6–10.4)
CO2: 20 mmol/L (ref 20–31)
CREATININE: 1.28 mg/dL — AB (ref 0.50–0.99)
Chloride: 106 mmol/L (ref 98–110)
GFR, Est African American: 50 mL/min — ABNORMAL LOW (ref >=60)
GFR, Est Non African American: 44 mL/min — ABNORMAL LOW (ref >=60)
GLUCOSE: 90 mg/dL (ref 65–99)
POTASSIUM: 3.9 mmol/L (ref 3.5–5.3)
SODIUM: 138 mmol/L (ref 135–146)
TOTAL PROTEIN: 7.7 g/dL (ref 6.1–8.1)

## 2016-09-22 LAB — THYROID PANEL WITH TSH
Free Thyroxine Index: 2.4 (ref 1.4–3.8)
T3 Uptake: 30 % (ref 22–35)
T4 TOTAL: 8.1 ug/dL (ref 4.5–12.0)
TSH: 1.93 mIU/L

## 2016-09-23 ENCOUNTER — Ambulatory Visit (INDEPENDENT_AMBULATORY_CARE_PROVIDER_SITE_OTHER): Payer: Medicare HMO | Admitting: Cardiology

## 2016-09-23 ENCOUNTER — Encounter: Payer: Self-pay | Admitting: Cardiology

## 2016-09-23 VITALS — BP 136/70 | HR 78 | Ht 66.0 in | Wt 297.6 lb

## 2016-09-23 DIAGNOSIS — I10 Essential (primary) hypertension: Secondary | ICD-10-CM | POA: Diagnosis not present

## 2016-09-23 DIAGNOSIS — E119 Type 2 diabetes mellitus without complications: Secondary | ICD-10-CM

## 2016-09-23 DIAGNOSIS — R002 Palpitations: Secondary | ICD-10-CM

## 2016-09-23 NOTE — Patient Instructions (Signed)
Medication Instructions:  The current medical regimen is effective;  continue present plan and medications.  Testing/Procedures: Your physician has recommended that you wear a holter monitor for 48 hours. Holter monitors are medical devices that record the heart's electrical activity. Doctors most often use these monitors to diagnose arrhythmias. Arrhythmias are problems with the speed or rhythm of the heartbeat. The monitor is a small, portable device. You can wear one while you do your normal daily activities. This is usually used to diagnose what is causing palpitations/syncope (passing out).  Follow-Up: Further follow up will be based on the results of above.  Thank you for choosing [facility]!!

## 2016-09-23 NOTE — Progress Notes (Signed)
Cardiology Office Note:    Date:  09/23/2016   ID:  [name] Cooper, DOB [birth year], MRN [id number]  PCP:  Scot Jun, FNP  Cardiologist:  Candee Furbish, MD    Referring MD: Scot Jun, FNP     History of Present Illness:    [name] Cooper is a 66 y.o. female here for evaluation of abnormal EKG at the request of Molli Barrows, NP  She states that she has been feeling an irregular heartbeat at times, randomly throughout the day with no specific triggers such as exertion or rest. No recent fevers, chills, syncope, bleeding, skin or hair changes. She does not take any Sudafed, does not drink any caffeine, only drinks water she states. They usually last less than a seconds duration, brief. No associated chest pain.  She has baseline shortness of breath and wheezes occasionally.  Has battled with morbid obesity, diabetes  She quit smoking at Thanksgiving 2017  EKG in 2016 shows a heart rate of 54 bpm. Echo in 2009 showed reassuring LV function.    Past Medical History:  Diagnosis Date  . Arthritis    "down the right side of my body" (09/24/2014)  . Asthma    proair inhaler  . Breast cancer, right breast (HCC)    S/P mastectomy  . Chronic lower back pain   . GERD (gastroesophageal reflux disease)   . 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 Hyperlipemia   . Hypertension   . 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 ABDOMINAL HYSTERECTOMY    . APPENDECTOMY    . BREAST BIOPSY Right 1996  . BREAST LUMPECTOMY Right 1996  . MASTECTOMY Right 1996  . TONSILLECTOMY      Current Medications: Current Meds  Medication Sig  . acetaminophen (TYLENOL) 650 MG CR tablet Take 1,300 mg by mouth every 8 (eight) hours as needed for pain. Reported on 09/23/2015  . albuterol (PROVENTIL HFA;VENTOLIN HFA) 108 (90 Base) MCG/ACT inhaler Inhale 2 puffs into the lungs every 6 (six) hours as needed for wheezing or  shortness of breath.  . budesonide-formoterol (SYMBICORT) 80-4.5 MCG/ACT inhaler Inhale 2 puffs into the lungs 2 (two) times daily.  Marland Kitchen doxycycline (VIBRAMYCIN) 100 MG capsule Take 1 capsule (100 mg total) by mouth 2 (two) times daily.  . hydrALAZINE (APRESOLINE) 25 MG tablet TAKE 1 TABLET THREE TIMES DAILY  . metFORMIN (GLUCOPHAGE) 500 MG tablet Take 1 tablet (500 mg total) by mouth 2 (two) times daily with a meal.  . pravastatin (PRAVACHOL) 40 MG tablet Take 1 tablet (40 mg total) by mouth daily.  . traMADol (ULTRAM) 50 MG tablet Take 1-2 tablets (50-100 mg total) by mouth every 12 (twelve) hours as needed for severe pain.  Marland Kitchen triamterene-hydrochlorothiazide (MAXZIDE-25) 37.5-25 MG tablet TAKE 1 TABLET EVERY DAY     Allergies:   Codeine   Social History   Social History  . Marital status: Widowed    Spouse name: N/A  . Number of children: N/A  . Years of education: N/A   Social History Main Topics  . Smoking status: Current Every Day Smoker    Packs/day: 0.25    Years: 43.00    Types: Cigarettes  . Smokeless tobacco: Never Used     Comment: 09/24/2014 "I've quit smoking 3 times"  . Alcohol use No  . Drug use: No     Comment: 09/24/2014 "last crack was in 2013"  .  Sexual activity: Not Asked   Other Topics Concern  . None   Social History Narrative  . None     Family History: The patient's family history is negative for Colon cancer. No early family history of coronary artery disease ROS:   Please see the history of present illness.   Occasional depression, cough, shortness of breath, wheeze, skipped heartbeats.  All other systems reviewed and are negative.  EKGs/Labs/Other Studies Reviewed:    The following studies were reviewed today:  ECHO 02/24/2008 - Overall left ventricular systolic function was normal. Left    ventricular ejection fraction was estimated to be 65 %. There    were no left ventricular regional wall motion abnormalities. - Normal aortic  valve - Normal mitral valve  EKG:  EKG is not ordered today.    Recent Labs: 09/21/2016: ALT 14; BUN 21; Creat 1.28; Hemoglobin 11.4; Platelets 294; Potassium 3.9; Sodium 138; TSH 1.93   Recent Lipid Panel    Component Value Date/Time   CHOL 196 09/21/2016 1115   TRIG 131 09/21/2016 1115   HDL 63 09/21/2016 1115   CHOLHDL 3.1 09/21/2016 1115   VLDL 26 09/21/2016 1115   LDLCALC 107 (H) 09/21/2016 1115    Physical Exam:    VS:  BP 136/70   Pulse 78   Ht 5\' 6"  (1.676 m)   Wt 297 lb 9.6 oz (135 kg)   LMP  (LMP Unknown)   BMI 48.03 kg/m     Wt Readings from Last 3 Encounters:  09/23/16 297 lb 9.6 oz (135 kg)  09/21/16 297 lb (134.7 kg)  03/17/16 292 lb (132.5 kg)     GEN:  Well nourished, well developed in no acute distress HEENT: Normal NECK: No JVD; No carotid bruits LYMPHATICS: No lymphadenopathy CARDIAC: RRR, no murmurs, rubs, gallops RESPIRATORY:  Clear to auscultation without rales, wheezing or rhonchi  ABDOMEN: Soft, non-tender, non-distended, obese MUSCULOSKELETAL:  No edema; No deformity  SKIN: Warm and dry NEUROLOGIC:  Alert and oriented x 3 PSYCHIATRIC:  Normal affect   ASSESSMENT:    1. Palpitations   2. Morbid obesity ([place])   3. Diabetes mellitus with coincident hypertension ([place])    PLAN:    In order of problems listed above:  Palpitations  - Occasional fluttering-like sensation in her chest, random. Nonexertional.  - No high-risk symptoms such as syncope.  - We will check a 48-hour Holter monitor to make sure that there are no adverse arrhythmias.  - These could be PVCs were PACs. Benign.  - She is at risk for atrial fibrillation however we want to make sure that the sensations are not atrial fibrillation. Because they are short lived, transient, they do sound more like premature beats.  Morbid obesity  - Continue to encourage weight loss. Good for overall heart health, reduce risks of pulmonary hypertension.  Tobacco cessation  - She quit  smoking since Thanksgiving  Diabetes with hypertension  - Continue to work on weight loss.   Medication Adjustments/Labs and Tests Ordered: Current medicines are reviewed at length with the patient today.  Concerns regarding medicines are outlined above. Labs and tests ordered and medication changes are outlined in the patient instructions below:  Patient Instructions  Medication Instructions:  The current medical regimen is effective;  continue present plan and medications.  Testing/Procedures: Your physician has recommended that you wear a holter monitor for 48 hours. Holter monitors are medical devices that record the heart's electrical activity. Doctors most often use these monitors  to diagnose arrhythmias. Arrhythmias are problems with the speed or rhythm of the heartbeat. The monitor is a small, portable device. You can wear one while you do your normal daily activities. This is usually used to diagnose what is causing palpitations/syncope (passing out).  Follow-Up: Further follow up will be based on the results of above.  Thank you for choosing [facility]!!        Signed, Candee Furbish, MD  09/23/2016 1:45 PM    [facility]

## 2016-09-24 LAB — URINE CULTURE

## 2016-09-24 LAB — IRON AND TIBC
%SAT: 15 % (ref 11–50)
Iron: 57 ug/dL (ref 45–160)
TIBC: 370 ug/dL (ref 250–450)
UIBC: 313 ug/dL

## 2016-09-25 LAB — FOLATE: Folate: 23.6 ng/mL (ref >5.4)

## 2016-09-29 ENCOUNTER — Ambulatory Visit (INDEPENDENT_AMBULATORY_CARE_PROVIDER_SITE_OTHER): Payer: Medicare HMO | Admitting: Sports Medicine

## 2016-09-29 ENCOUNTER — Encounter: Payer: Self-pay | Admitting: Sports Medicine

## 2016-09-29 DIAGNOSIS — B351 Tinea unguium: Secondary | ICD-10-CM

## 2016-09-29 DIAGNOSIS — E119 Type 2 diabetes mellitus without complications: Secondary | ICD-10-CM | POA: Diagnosis not present

## 2016-09-29 DIAGNOSIS — M79675 Pain in left toe(s): Secondary | ICD-10-CM

## 2016-09-29 DIAGNOSIS — M79674 Pain in right toe(s): Secondary | ICD-10-CM

## 2016-09-29 NOTE — Progress Notes (Signed)
Subjective: [name] Cooper is a 66 y.o. female patient with history of diabetes who presents to office today complaining of long, painful nails  while ambulating in shoes; unable to trim. Patient states that the glucose reading this morning was not recorded, newly diagnosed, taking Metformin. Patient denies any new changes in medication or new problems. Patient denies any new cramping, numbness, burning or tingling in the legs.  Patient Active Problem List   Diagnosis Date Noted  . Estrogen deficiency 03/30/2016  . Rhinitis, allergic   . Pre-diabetes   . Angioedema 09/24/2014  . HTN (hypertension) 09/24/2014  . HLD (hyperlipidemia) 09/24/2014  . GERD (gastroesophageal reflux disease) 09/24/2014  . OA (osteoarthritis) 09/24/2014  . Swallowing difficulty   . PULMONARY NODULE 03/21/2008  . CHEST PAIN 03/21/2008  . NEOPLASM, MALIGNANT, BREAST, HX OF 03/21/2008   Current Outpatient Prescriptions on File Prior to Visit  Medication Sig Dispense Refill  . acetaminophen (TYLENOL) 650 MG CR tablet Take 1,300 mg by mouth every 8 (eight) hours as needed for pain. Reported on 09/23/2015    . albuterol (PROVENTIL HFA;VENTOLIN HFA) 108 (90 Base) MCG/ACT inhaler Inhale 2 puffs into the lungs every 6 (six) hours as needed for wheezing or shortness of breath. 1 Inhaler 3  . budesonide-formoterol (SYMBICORT) 80-4.5 MCG/ACT inhaler Inhale 2 puffs into the lungs 2 (two) times daily. 1 Inhaler 3  . doxycycline (VIBRAMYCIN) 100 MG capsule Take 1 capsule (100 mg total) by mouth 2 (two) times daily. 20 capsule 0  . hydrALAZINE (APRESOLINE) 25 MG tablet TAKE 1 TABLET THREE TIMES DAILY 270 tablet 1  . metFORMIN (GLUCOPHAGE) 500 MG tablet Take 1 tablet (500 mg total) by mouth 2 (two) times daily with a meal. 180 tablet 3  . pravastatin (PRAVACHOL) 40 MG tablet Take 1 tablet (40 mg total) by mouth daily. 90 tablet 3  . traMADol (ULTRAM) 50 MG tablet Take 1-2 tablets (50-100 mg total) by mouth every 12 (twelve) hours  as needed for severe pain. 30 tablet 0  . triamterene-hydrochlorothiazide (MAXZIDE-25) 37.5-25 MG tablet TAKE 1 TABLET EVERY DAY 90 tablet 1   No current facility-administered medications on file prior to visit.    Allergies  Allergen Reactions  . Codeine     REACTION: MAKES HER SLEEPY    Recent Results (from the past 2160 hour(s))  POCT urinalysis dip (device)     Status: Abnormal   Collection Time: 09/21/16 11:01 AM  Result Value Ref Range   Glucose, UA NEGATIVE NEGATIVE mg/dL   Bilirubin Urine NEGATIVE NEGATIVE   Ketones, ur NEGATIVE NEGATIVE mg/dL   Specific Gravity, Urine 1.015 1.005 - 1.030   Hgb urine dipstick NEGATIVE NEGATIVE   pH 7.0 5.0 - 8.0   Protein, ur NEGATIVE NEGATIVE mg/dL   Urobilinogen, UA 0.2 0.0 - 1.0 mg/dL   Nitrite POSITIVE (A) NEGATIVE   Leukocytes, UA LARGE (A) NEGATIVE    Comment: Biochemical Testing Only. Please order routine urinalysis from main lab if confirmatory testing is needed.  COMPLETE METABOLIC PANEL WITH GFR     Status: Abnormal   Collection Time: 09/21/16 11:15 AM  Result Value Ref Range   Sodium 138 135 - 146 mmol/L   Potassium 3.9 3.5 - 5.3 mmol/L   Chloride 106 98 - 110 mmol/L   CO2 20 20 - 31 mmol/L   Glucose, Bld 90 65 - 99 mg/dL   BUN 21 7 - 25 mg/dL   Creat 1.28 (H) 0.50 - 0.99 mg/dL    Comment:  For patients > or = 67 years of age: The upper reference limit for Creatinine is approximately 13% higher for people identified as African-American.      Total Bilirubin 0.7 0.2 - 1.2 mg/dL   Alkaline Phosphatase 78 33 - 130 U/L   AST 18 10 - 35 U/L   ALT 14 6 - 29 U/L   Total Protein 7.7 6.1 - 8.1 g/dL   Albumin 3.9 3.6 - 5.1 g/dL   Calcium 9.5 8.6 - 10.4 mg/dL   GFR, Est African American 50 (L) >=60 mL/min   GFR, Est Non African American 44 (L) >=60 mL/min  CBC with Differential     Status: Abnormal   Collection Time: 09/21/16 11:15 AM  Result Value Ref Range   WBC 9.9 3.8 - 10.8 K/uL   RBC 5.32 (H) 3.80 - 5.10 MIL/uL    Hemoglobin 11.4 (L) 11.7 - 15.5 g/dL   HCT 36.4 35.0 - 45.0 %   MCV 68.4 (L) 80.0 - 100.0 fL   MCH 21.4 (L) 27.0 - 33.0 pg   MCHC 31.3 (L) 32.0 - 36.0 g/dL   RDW 18.2 (H) 11.0 - 15.0 %   Platelets 294 140 - 400 K/uL   MPV 9.7 7.5 - 12.5 fL   Neutro Abs 4,752 1,500 - 7,800 cells/uL   Lymphs Abs 3,663 850 - 3,900 cells/uL   Monocytes Absolute 792 200 - 950 cells/uL   Eosinophils Absolute 594 (H) 15 - 500 cells/uL   Basophils Absolute 99 0 - 200 cells/uL   Neutrophils Relative % 48 %   Lymphocytes Relative 37 %   Monocytes Relative 8 %   Eosinophils Relative 6 %   Basophils Relative 1 %   Smear Review Criteria for review not met   Thyroid Panel With TSH     Status: None   Collection Time: 09/21/16 11:15 AM  Result Value Ref Range   T4, Total 8.1 4.5 - 12.0 ug/dL   T3 Uptake 30 22 - 35 %   Free Thyroxine Index 2.4 1.4 - 3.8   TSH 1.93 mIU/L    Comment:   Reference Range   > or = 20 Years  0.40-4.50   Pregnancy Range First trimester  0.26-2.66 Second trimester 0.55-2.73 Third trimester  0.43-2.91     Lipid panel     Status: Abnormal   Collection Time: 09/21/16 11:15 AM  Result Value Ref Range   Cholesterol 196 <200 mg/dL   Triglycerides 131 <150 mg/dL   HDL 63 >50 mg/dL   Total CHOL/HDL Ratio 3.1 <5.0 Ratio   VLDL 26 <30 mg/dL   LDL Cholesterol 107 (H) <100 mg/dL  Iron and TIBC     Status: None   Collection Time: 09/21/16 11:15 AM  Result Value Ref Range   Iron 57 45 - 160 ug/dL   UIBC 313 ug/dL   TIBC 370 250 - 450 ug/dL   %SAT 15 11 - 50 %  Folate     Status: None   Collection Time: 09/21/16 11:15 AM  Result Value Ref Range   Folate 23.6 >5.4 ng/mL    Comment: Reference Range >17 years:   Low: <3.4 ng/mL              Borderline: 3.4-5.4 ng/mL              Normal: >5.4 ng/mL     POCT glycosylated hemoglobin (Hb A1C)     Status: Abnormal   Collection Time: 09/21/16 12:05 PM  Result  Value Ref Range   Hemoglobin A1C 6.4   Urine culture     Status: None    Collection Time: 09/21/16 12:22 PM  Result Value Ref Range   Culture ESCHERICHIA COLI    Colony Count Greater than 100,000 CFU/mL    Organism ID, Bacteria ESCHERICHIA COLI       Susceptibility   Escherichia coli -  (no method available)    AMPICILLIN >=32 Resistant     AMOX/CLAVULANIC 8 Sensitive     AMPICILLIN/SULBACTAM >=32 Resistant     PIP/TAZO <=4 Sensitive     IMIPENEM <=0.25 Sensitive     CEFAZOLIN <=4 Not Reportable     CEFTRIAXONE <=1 Sensitive     CEFTAZIDIME <=1 Sensitive     CEFEPIME <=1 Sensitive     GENTAMICIN <=1 Sensitive     TOBRAMYCIN <=1 Sensitive     CIPROFLOXACIN <=0.25 Sensitive     LEVOFLOXACIN 0.5 Sensitive     NITROFURANTOIN <=16 Sensitive     TRIMETH/SULFA* <=20 Sensitive      * NR=NOT REPORTABLE,SEE COMMENTORAL therapy:A cefazolin MIC of <32 predicts susceptibility to the oral agents cefaclor,cefdinir,cefpodoxime,cefprozil,cefuroxime,cephalexin,and loracarbef when used for therapy of uncomplicated UTIs due to E.coli,K.pneumomiae,and P.mirabilis. PARENTERAL therapy: A cefazolinMIC of >8 indicates resistance to parenteralcefazolin. An alternate test method must beperformed to confirm susceptibility to parenteralcefazolin.    Objective: General: Patient is awake, alert, and oriented x 3 and in no acute distress.  Integument: Skin is warm, dry and supple bilateral. Nails are tender, long, thickened and dystrophic with subungual debris, consistent with onychomycosis, 1-5 bilateral. No signs of infection. No open lesions or preulcerative lesions present bilateral. Remaining integument unremarkable.  Vasculature:  Dorsalis Pedis pulse 1/4 bilateral. Posterior Tibial pulse  1/4 bilateral. Capillary fill time <3 sec 1-5 bilateral. Positive hair growth to the level of the digits.Temperature gradient within normal limits. No varicosities present bilateral. No edema present bilateral.   Neurology: The patient has intact sensation measured with a 5.07/10g Semmes  Weinstein Monofilament at all pedal sites bilateral . Vibratory sensation intact bilateral with tuning fork. No Babinski sign present bilateral.   Musculoskeletal: No symptomatic pedal deformities noted bilateral. Muscular strength 5/5 in all lower extremity muscular groups bilateral without pain on range of motion. No tenderness with calf compression bilateral.  Assessment and Plan: Problem List Items Addressed This Visit    None    Visit Diagnoses    Pain due to onychomycosis of toenails of both feet    -  Primary   Diabetes mellitus without complication ([place])         -Examined patient. -Discussed and educated patient on diabetic foot care, especially with  regards to the vascular, neurological and musculoskeletal systems.  -Stressed the importance of good glycemic control and the detriment of not  controlling glucose levels in relation to the foot. -Mechanically debrided all nails 1-5 bilateral using sterile nail nipper and filed with dremel without incident  -Answered all patient questions -Patient to return  in 3 months for at risk foot care -Patient advised to call the office if any problems or questions arise in the meantime.  Landis Martins, DPM

## 2016-10-06 ENCOUNTER — Ambulatory Visit (INDEPENDENT_AMBULATORY_CARE_PROVIDER_SITE_OTHER): Payer: Medicare HMO

## 2016-10-06 DIAGNOSIS — R002 Palpitations: Secondary | ICD-10-CM

## 2016-10-08 ENCOUNTER — Ambulatory Visit: Payer: Medicare HMO | Attending: Orthopedic Surgery | Admitting: Physical Therapy

## 2016-10-08 ENCOUNTER — Encounter: Payer: Self-pay | Admitting: Physical Therapy

## 2016-10-08 DIAGNOSIS — G8929 Other chronic pain: Secondary | ICD-10-CM | POA: Diagnosis present

## 2016-10-08 DIAGNOSIS — M25611 Stiffness of right shoulder, not elsewhere classified: Secondary | ICD-10-CM | POA: Diagnosis present

## 2016-10-08 DIAGNOSIS — M25511 Pain in right shoulder: Secondary | ICD-10-CM | POA: Diagnosis present

## 2016-10-08 NOTE — Therapy (Signed)
[place], [place], [address] Phone: [phone]   Fax:  [phone]  Physical Therapy Evaluation  Patient Details  Name: [name] Cooper MRN: [id number] Date of Birth: [birth year] Referring Provider: Marchia Bond, MD  Encounter Date: 10/08/2016      PT End of Session - 10/08/16 0850    Visit Number 1   Number of Visits 17   Date for PT Re-Evaluation 12/04/16   Authorization Type [employer] MCR- KX at visit 15   PT Start Time 0848   PT Stop Time 0934   PT Time Calculation (min) 46 min   Activity Tolerance Patient tolerated treatment well   Behavior During Therapy [facility] for tasks assessed/performed      Past Medical History:  Diagnosis Date  . Arthritis    "down the right side of my body" (09/24/2014)  . Asthma    proair inhaler  . Breast cancer, right breast (HCC)    S/P mastectomy  . Chronic lower back pain   . GERD (gastroesophageal reflux disease)   . 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 Hyperlipemia   . Hypertension   . 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 ABDOMINAL HYSTERECTOMY    . APPENDECTOMY    . BREAST BIOPSY Right 1996  . BREAST LUMPECTOMY Right 1996  . MASTECTOMY Right 1996  . TONSILLECTOMY      There were no vitals filed for this visit.       Subjective Assessment - 10/08/16 0853    Subjective About March 2018 began feeling pain of insidious onset, pain increased over time. Pt thought it was arthritis but progressed to barely being able to lift arm. Occasional N/T in R arm to hand. Movement feels more limited by pain than tightness.    Patient Stated Goals brush hair, do regular stuff, use R hand   Currently in Pain? Yes   Pain Score 5    Pain Location Shoulder   Pain Orientation Right   Pain Descriptors / Indicators Nagging;Aching   Pain Type Chronic pain   Pain Radiating Towards R upper extremity   Pain Onset More than a month ago   Pain Frequency Constant   Aggravating Factors  moving/using arm   Pain Relieving Factors tylenol            [facility] PT Assessment - 10/08/16 0001      Assessment   Medical Diagnosis R adhesive capsulitis   Referring Provider Marchia Bond, MD   Hand Dominance Right   Next MD Visit 12/30/16   Prior Therapy no     Precautions   Precautions None     Restrictions   Weight Bearing Restrictions No     Balance Screen   Has the patient fallen in the past 6 months Yes   How many times? 1  legs gave out while standing   Has the patient had a decrease in activity level because of a fear of falling?  No   Is the patient reluctant to leave their home because of a fear of falling?  No     Home Environment   Living Environment Private residence   Living Arrangements Alone   Additional Comments no stairs     Prior Function   Level of Independence Independent     Cognition   Overall Cognitive Status Within Functional Limits for tasks assessed  Sensation   Additional Comments tingling in R arm & hand     ROM / Strength   AROM / PROM / Strength AROM;PROM;Strength     AROM   AROM Assessment Site Shoulder   Right/Left Shoulder Right;Left   Right Shoulder Flexion 50 Degrees   Right Shoulder ABduction 48 Degrees   Left Shoulder Flexion 136 Degrees   Left Shoulder ABduction 115 Degrees     PROM   Overall PROM Comments painful, empty end feel   PROM Assessment Site Shoulder   Right/Left Shoulder Right   Right Shoulder Flexion 124 Degrees   Right Shoulder ABduction 55 Degrees     Strength   Overall Strength Comments shoulder grossly 3-/5   Strength Assessment Site Hand   Right/Left hand --  equal grip 55lb     Palpation   Palpation comment TTP soft tissue surrounding GHJ into cerical and arm            Objective measurements completed on examination: See above findings.                  PT Education - 10/08/16  1101    Education provided Yes   Education Details anatomy of condition, POC, HEP, exercise form/rationale   Person(s) Educated Patient   Methods Explanation;Tactile cues;Verbal cues;Demonstration;Handout   Comprehension Verbalized understanding;Returned demonstration;Tactile cues required;Need further instruction;Verbal cues required          PT Short Term Goals - 10/08/16 1107      PT SHORT TERM GOAL #1   Title Pt to demo at least 10 deg increase in GHJ AROM in flexion and abduction by 7/20   Baseline see flowsheet   Time 4   Period Weeks   Status New     PT SHORT TERM GOAL #2   Title Pt will verbalize increased use of R upper extremity in daily activities such as washing dishes and other activities that require low GHJ ROM   Baseline reports mostly using just her L arm   Time 4   Period Weeks   Status New           PT Long Term Goals - 10/08/16 1110      PT LONG TERM GOAL #1   Title FOTO to 36% limitation to indicate significant improvement in functional ability by 8/17   Baseline 54% limitation at eval   Time 8   Period Weeks   Status New     PT LONG TERM GOAL #2   Title Pt will be able to use her R hand to brush her hair for improved self care ability   Baseline uses L at eval   Time 8   Period Weeks   Status New     PT LONG TERM GOAL #3   Title Pt will be able to use R arm to carry 5lb grocery bag and lift the bag onto the counter pain <=3/10   Baseline uses L at eval   Time 8   Period Weeks   Status New     PT LONG TERM GOAL #4   Title GHJ AROM flexion and abduction to at least 100 deg to demo improved functional ROM   Baseline see flowsheet   Time 8   Period Weeks   Status New     PT LONG TERM GOAL #5   Title Pt will be able to reach behind to at least L3 for improved function in self cleaning and dressing   Baseline  unable due to pain at eval   Time 8   Period Weeks   Status New                Plan - 10/08/16 1102    Clinical  Impression Statement pt presents to PT with complaints of R shoulder pain, limited ROM and limited functional use. PROM greater than active with painful, empty end feel. Decreased pain reported following myofascial release to R upper trap. Discussed expected progression of adhesive capsulitis and pain to be associated with stretching. Pt will benefit from skilled PT in order to improve functional range and strength in R shoulder.    History and Personal Factors relevant to plan of care: R breast CA & mastectomy 1996   Clinical Presentation Evolving   Clinical Presentation due to: worsening pain and decreased ROM   Clinical Decision Making Low   Rehab Potential Good   PT Frequency 2x / week   PT Duration 8 weeks   PT Treatment/Interventions ADLs/Self Care Home Management;Cryotherapy;Electrical Stimulation;Iontophoresis 4mg /ml Dexamethasone;Functional mobility training;Ultrasound;Moist Heat;Traction;Therapeutic activities;Therapeutic exercise;Neuromuscular re-education;Patient/family education;Passive range of motion;Manual techniques;Dry needling;Taping   PT Next Visit Plan PROM, decrease cervical tightness   PT Home Exercise Plan upper trap stretch, door pec stretch-low, scapular retraction, pendulums   Consulted and Agree with Plan of Care Patient      Patient will benefit from skilled therapeutic intervention in order to improve the following deficits and impairments:  Decreased range of motion, Impaired UE functional use, Increased muscle spasms, Decreased activity tolerance, Pain, Impaired flexibility, Decreased mobility, Decreased strength, Postural dysfunction, Impaired sensation  Visit Diagnosis: Chronic right shoulder pain - Plan: PT plan of care cert/re-cert  Stiffness of right shoulder, not elsewhere classified - Plan: PT plan of care cert/re-cert      G-Codes - 65/46/50 1115    Functional Assessment Tool Used (Outpatient Only) FOTO 56% limitiation (gaol 34%), clinical judgement    Functional Limitation Carrying, moving and handling objects   Carrying, Moving and Handling Objects Current Status (P5465) At least 40 percent but less than 60 percent impaired, limited or restricted   Carrying, Moving and Handling Objects Goal Status (K8127) At least 20 percent but less than 40 percent impaired, limited or restricted       Problem List Patient Active Problem List   Diagnosis Date Noted  . Estrogen deficiency 03/30/2016  . Rhinitis, allergic   . Pre-diabetes   . Angioedema 09/24/2014  . HTN (hypertension) 09/24/2014  . HLD (hyperlipidemia) 09/24/2014  . GERD (gastroesophageal reflux disease) 09/24/2014  . OA (osteoarthritis) 09/24/2014  . Swallowing difficulty   . PULMONARY NODULE 03/21/2008  . CHEST PAIN 03/21/2008  . NEOPLASM, MALIGNANT, BREAST, HX OF 03/21/2008   [name] Cooper C. Tabytha Gradillas PT, DPT 10/08/16 11:25 AM   [facility] [address] [place], [place], [address] Phone: [phone]   Fax:  [phone]  Name: Kerston Landeck MRN: [id number] Date of Birth: [birth year]

## 2016-10-14 ENCOUNTER — Ambulatory Visit: Payer: Medicare HMO | Admitting: Physical Therapy

## 2016-10-14 DIAGNOSIS — M25511 Pain in right shoulder: Principal | ICD-10-CM

## 2016-10-14 DIAGNOSIS — G8929 Other chronic pain: Secondary | ICD-10-CM

## 2016-10-14 DIAGNOSIS — M25611 Stiffness of right shoulder, not elsewhere classified: Secondary | ICD-10-CM

## 2016-10-14 NOTE — Therapy (Signed)
[place], [place], [address] Phone: [phone]   Fax:  [phone]  Physical Therapy Treatment  Patient Details  Name: Orion Vandervort MRN: [id number] Date of Birth: 1950/11/29 Referring Provider: Marchia Bond, MD  Encounter Date: 10/14/2016      PT End of Session - 10/14/16 1020    Visit Number 2   Number of Visits 17   Date for PT Re-Evaluation 12/04/16   Authorization Type [employer] MCR- KX at visit 15   PT Start Time 1015   PT Stop Time 1100   PT Time Calculation (min) 45 min      Past Medical History:  Diagnosis Date  . Arthritis    "down the right side of my body" (09/24/2014)  . Asthma    proair inhaler  . Breast cancer, right breast (HCC)    S/P mastectomy  . Chronic lower back pain   . GERD (gastroesophageal reflux disease)   . 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 Hyperlipemia   . Hypertension   . 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 ABDOMINAL HYSTERECTOMY    . APPENDECTOMY    . BREAST BIOPSY Right 1996  . BREAST LUMPECTOMY Right 1996  . MASTECTOMY Right 1996  . TONSILLECTOMY      There were no vitals filed for this visit.      Subjective Assessment - 10/14/16 1019    Subjective Doing a lot better.    Currently in Pain? Yes   Pain Score 2    Pain Location Shoulder   Pain Orientation Right   Pain Descriptors / Indicators Dull;Throbbing   Pain Type Chronic pain   Aggravating Factors  combing hair    Pain Relieving Factors exercises, tylenol            [facility] PT Assessment - 10/14/16 0001      AROM   Right Shoulder Flexion 120 Degrees   Right Shoulder ABduction 115 Degrees                     [facility] Adult PT Treatment/Exercise - 10/14/16 0001      Shoulder Exercises: Supine   Other Supine Exercises Supine cane press ups, pull overs, ER, horizontal abduction/adduction      Shoulder Exercises: Standing   Row 15 reps   Theraband Level (Shoulder Row) Level 2 (Red)   Row Limitations cues for posture    Retraction 10 reps   Retraction Limitations seated and standing      Shoulder Exercises: ROM/Strengthening   UBE (Upper Arm Bike) L1 2 minutes each way      Shoulder Exercises: IT sales professional 20 seconds;2 reps   Corner Stretch Limitations right UE doorway stretch @60  degrees abduction , per HEP     Manual Therapy   Manual Therapy Passive ROM   Passive ROM flexion, abduction, ER, IR to tolerance, pain with abduction and IR      Neck Exercises: Stretches   Upper Trapezius Stretch 2 reps;20 seconds                PT Education - 10/14/16 1302    Education provided Yes   Education Details HEP   Person(s) Educated Patient   Methods Explanation;Handout   Comprehension Verbalized understanding          PT Short Term Goals - 10/14/16 1042  PT SHORT TERM GOAL #1   Title Pt to demo at least 10 deg increase in GHJ AROM in flexion and abduction by 7/20   Time 4   Period Weeks   Status Achieved     PT SHORT TERM GOAL #2   Title Pt will verbalize increased use of R upper extremity in daily activities such as washing dishes and other activities that require low GHJ ROM   Time 4   Period Weeks   Status Achieved           PT Long Term Goals - 10/08/16 1110      PT LONG TERM GOAL #1   Title FOTO to 36% limitation to indicate significant improvement in functional ability by 8/17   Baseline 54% limitation at eval   Time 8   Period Weeks   Status New     PT LONG TERM GOAL #2   Title Pt will be able to use her R hand to brush her hair for improved self care ability   Baseline uses L at eval   Time 8   Period Weeks   Status New     PT LONG TERM GOAL #3   Title Pt will be able to use R arm to carry 5lb grocery bag and lift the bag onto the counter pain <=3/10   Baseline uses L at eval   Time 8   Period Weeks   Status New      PT LONG TERM GOAL #4   Title GHJ AROM flexion and abduction to at least 100 deg to demo improved functional ROM   Baseline see flowsheet   Time 8   Period Weeks   Status New     PT LONG TERM GOAL #5   Title Pt will be able to reach behind to at least L3 for improved function in self cleaning and dressing   Baseline unable due to pain at eval   Time 8   Period Weeks   Status New               Plan - 10/14/16 1040    Clinical Impression Statement Pt reports significant decrease in pain. Her AROM has greatly improved. She is able to use her RUE for more ADLS. She notes pain with combing her hair using RUE. Able to begin AAROM and gentle strengthening exercises with mild increase in pain. STG#1, #2 met. Updated HEP with supine cane exercises.    PT Next Visit Plan PROM, decrease cervical tightness, AAROM   PT Home Exercise Plan upper trap stretch, door pec stretch-low, scapular retraction, pendulums, supine cane    Consulted and Agree with Plan of Care Patient      Patient will benefit from skilled therapeutic intervention in order to improve the following deficits and impairments:  Decreased range of motion, Impaired UE functional use, Increased muscle spasms, Decreased activity tolerance, Pain, Impaired flexibility, Decreased mobility, Decreased strength, Postural dysfunction, Impaired sensation  Visit Diagnosis: Chronic right shoulder pain  Stiffness of right shoulder, not elsewhere classified     Problem List Patient Active Problem List   Diagnosis Date Noted  . Estrogen deficiency 03/30/2016  . Rhinitis, allergic   . Pre-diabetes   . Angioedema 09/24/2014  . HTN (hypertension) 09/24/2014  . HLD (hyperlipidemia) 09/24/2014  . GERD (gastroesophageal reflux disease) 09/24/2014  . OA (osteoarthritis) 09/24/2014  . Swallowing difficulty   . PULMONARY NODULE 03/21/2008  . CHEST PAIN 03/21/2008  . NEOPLASM, MALIGNANT, BREAST, HX  OF 03/21/2008    Dorene Ar, PTA 10/14/2016, 1:06 PM  [facility] [address] [place], [place], [address] Phone: [phone]   Fax:  [phone]  Name: Kayleah Appleyard MRN: [id number] Date of Birth: 1950-06-11

## 2016-10-14 NOTE — Patient Instructions (Signed)
SHOULDER: External Rotation - Supine (Cane)   Hold cane with both hands. Rotate arm away from body. Keep elbow on floor and next to body. _10__ reps per set, _2__ sets per day, _7__ days per week Add towel to keep elbow at side.  Copyright  VHI. All rights reserved.  Cane Horizontal - Supine   With straight arms holding cane above shoulders, bring cane out to right, center, out to left, and back to above head. Repeat __10_ times. Do __2_ times per day.  Copyright  VHI. All rights reserved.  Cane Exercise: Flexion   Lie on back, holding cane above chest. Keeping arms as straight as possible, lower cane toward floor beyond head. Hold __5__ seconds. Repeat __10__ times. Do _2___ sessions per day.  [email or web address].exer.[email or web address]   Copyright  VHI. All rights reserved.   

## 2016-10-16 ENCOUNTER — Ambulatory Visit: Payer: Medicare HMO | Admitting: Physical Therapy

## 2016-10-16 DIAGNOSIS — G8929 Other chronic pain: Secondary | ICD-10-CM

## 2016-10-16 DIAGNOSIS — M25611 Stiffness of right shoulder, not elsewhere classified: Secondary | ICD-10-CM

## 2016-10-16 DIAGNOSIS — M25511 Pain in right shoulder: Principal | ICD-10-CM

## 2016-10-16 NOTE — Therapy (Signed)
[place], [place], [address] Phone: [phone]   Fax:  [phone]  Physical Therapy Treatment  Patient Details  Name: [name] Cooper MRN: [id number] Date of Birth: 11-08-1950 Referring Provider: Marchia Bond, MD  Encounter Date: 10/16/2016      PT End of Session - 10/16/16 1008    Visit Number 3   Number of Visits 17   Date for PT Re-Evaluation 12/04/16   Authorization Type [employer] MCR- KX at visit 15   PT Start Time 1005   PT Stop Time 1101   PT Time Calculation (min) 56 min      Past Medical History:  Diagnosis Date  . Arthritis    "down the right side of my body" (09/24/2014)  . Asthma    proair inhaler  . Breast cancer, right breast (HCC)    S/P mastectomy  . Chronic lower back pain   . GERD (gastroesophageal reflux disease)   . 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 Hyperlipemia   . Hypertension   . 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 ABDOMINAL HYSTERECTOMY    . APPENDECTOMY    . BREAST BIOPSY Right 1996  . BREAST LUMPECTOMY Right 1996  . MASTECTOMY Right 1996  . TONSILLECTOMY      There were no vitals filed for this visit.      Subjective Assessment - 10/16/16 1008    Subjective Sore after last visit    Currently in Pain? No/denies                         [facility] Adult PT Treatment/Exercise - 10/16/16 0001      Shoulder Exercises: Supine   Other Supine Exercises Supine cane press ups, pull overs, ER, horizontal abduction/adduction      Shoulder Exercises: Standing   Extension 10 reps   Theraband Level (Shoulder Extension) Level 2 (Red)   Row 15 reps   Theraband Level (Shoulder Row) Level 2 (Red)   Other Standing Exercises UE ranger flexion , horizontal abduction, ER, IR - device on floor      Shoulder Exercises: Pulleys   Flexion 1 minute   Flexion Limitations increased pain       Shoulder Exercises: ROM/Strengthening   UBE (Upper Arm Bike) L1 2 minutes each way   cues to use L UE more to decrease pain.      Shoulder Exercises: Hotel manager Limitations right UE doorway stretch @60  degrees abduction , per HEP     Modalities   Modalities Moist Heat     Moist Heat Therapy   Number Minutes Moist Heat 15 Minutes   Moist Heat Location Shoulder     Manual Therapy   Manual Therapy Soft tissue mobilization   Soft tissue mobilization right scalenes, upper trap,  pec      Neck Exercises: Stretches   Upper Trapezius Stretch 2 reps;20 seconds   Other Neck Stretches scalenes stretch                   PT Short Term Goals - 10/14/16 1042      PT SHORT TERM GOAL #1   Title Pt to demo at least 10 deg increase in GHJ AROM in flexion and abduction by 7/20   Time 4   Period Weeks  Status Achieved     PT SHORT TERM GOAL #2   Title Pt will verbalize increased use of R upper extremity in daily activities such as washing dishes and other activities that require low GHJ ROM   Time 4   Period Weeks   Status Achieved           PT Long Term Goals - 10/08/16 1110      PT LONG TERM GOAL #1   Title FOTO to 36% limitation to indicate significant improvement in functional ability by 8/17   Baseline 54% limitation at eval   Time 8   Period Weeks   Status New     PT LONG TERM GOAL #2   Title Pt will be able to use her R hand to brush her hair for improved self care ability   Baseline uses L at eval   Time 8   Period Weeks   Status New     PT LONG TERM GOAL #3   Title Pt will be able to use R arm to carry 5lb grocery bag and lift the bag onto the counter pain <=3/10   Baseline uses L at eval   Time 8   Period Weeks   Status New     PT LONG TERM GOAL #4   Title GHJ AROM flexion and abduction to at least 100 deg to demo improved functional ROM   Baseline see flowsheet   Time 8   Period Weeks   Status  New     PT LONG TERM GOAL #5   Title Pt will be able to reach behind to at least L3 for improved function in self cleaning and dressing   Baseline unable due to pain at eval   Time 8   Period Weeks   Status New               Plan - 10/16/16 1031    Clinical Impression Statement Pt enters with no pain. Reviewed Supine cane and she has less pain with this today. Pain increased on UBE and pulleys. Improved IR reach to mid lumbar after UE ranger. Manual to right neck muscles and pectoralis. HMP applied to decrease tenderness in right neck and shoulder.    PT Next Visit Plan PROM, decrease cervical tightness, AAROM   PT Home Exercise Plan upper trap stretch, door pec stretch-low, scapular retraction, pendulums, supine cane    Consulted and Agree with Plan of Care Patient      Patient will benefit from skilled therapeutic intervention in order to improve the following deficits and impairments:  Decreased range of motion, Impaired UE functional use, Increased muscle spasms, Decreased activity tolerance, Pain, Impaired flexibility, Decreased mobility, Decreased strength, Postural dysfunction, Impaired sensation  Visit Diagnosis: Chronic right shoulder pain  Stiffness of right shoulder, not elsewhere classified     Problem List Patient Active Problem List   Diagnosis Date Noted  . Estrogen deficiency 03/30/2016  . Rhinitis, allergic   . Pre-diabetes   . Angioedema 09/24/2014  . HTN (hypertension) 09/24/2014  . HLD (hyperlipidemia) 09/24/2014  . GERD (gastroesophageal reflux disease) 09/24/2014  . OA (osteoarthritis) 09/24/2014  . Swallowing difficulty   . PULMONARY NODULE 03/21/2008  . CHEST PAIN 03/21/2008  . NEOPLASM, MALIGNANT, BREAST, HX OF 03/21/2008    Dorene Ar, PTA 10/16/2016, [place], [place], [address] Phone: [phone]   Fax:  [phone]  Name: Wylma Tatem MRN:  [id number] Date of Birth: 08-26-1950

## 2016-10-19 ENCOUNTER — Ambulatory Visit: Payer: Medicare HMO | Attending: Orthopedic Surgery | Admitting: Physical Therapy

## 2016-10-19 DIAGNOSIS — G8929 Other chronic pain: Secondary | ICD-10-CM

## 2016-10-19 DIAGNOSIS — M25511 Pain in right shoulder: Secondary | ICD-10-CM | POA: Diagnosis not present

## 2016-10-19 DIAGNOSIS — M25611 Stiffness of right shoulder, not elsewhere classified: Secondary | ICD-10-CM | POA: Diagnosis present

## 2016-10-19 NOTE — Therapy (Addendum)
[place], [place], [address] Phone: [phone]   Fax:  [phone]  Physical Therapy Treatment  Patient Details  Name: [name] Cooper MRN: [id number] Date of Birth: [birth year] Referring Provider: Marchia Bond, MD  Encounter Date: 10/19/2016      PT End of Session - 10/19/16 0852    Visit Number 4   Number of Visits 17   Date for PT Re-Evaluation 12/04/16   Authorization Type [employer] MCR- KX at visit 15   PT Start Time 0845   PT Stop Time 0945   PT Time Calculation (min) 60 min      Past Medical History:  Diagnosis Date  . Arthritis    "down the right side of my body" (09/24/2014)  . Asthma    proair inhaler  . Breast cancer, right breast (HCC)    S/P mastectomy  . Chronic lower back pain   . GERD (gastroesophageal reflux disease)   . 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 Hyperlipemia   . Hypertension   . 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 ABDOMINAL HYSTERECTOMY    . APPENDECTOMY    . BREAST BIOPSY Right 1996  . BREAST LUMPECTOMY Right 1996  . MASTECTOMY Right 1996  . TONSILLECTOMY      There were no vitals filed for this visit.      Subjective Assessment - 10/19/16 0854    Subjective My shoulder is not hurting now.    Currently in Pain? No/denies                         [facility] Adult PT Treatment/Exercise - 10/19/16 0001      Shoulder Exercises: Supine   Other Supine Exercises Supine cane press ups, pull overs, ER, horizontal abduction/adduction      Shoulder Exercises: Standing   External Rotation 10 reps   Theraband Level (Shoulder External Rotation) Level 1 (Yellow)   Internal Rotation 10 reps   Theraband Level (Shoulder Internal Rotation) Level 1 (Yellow)   Flexion Limitations isometric with towel on wall 90 degrees    Extension 15 reps   Theraband Level (Shoulder Extension) Level 2  (Red)   Row 15 reps   Theraband Level (Shoulder Row) Level 2 (Red)   Other Standing Exercises UE ranger flexion , horizontal abduction, ER, IR - device on floor      Shoulder Exercises: Pulleys   Flexion 1 minute   Flexion Limitations increased pain      Shoulder Exercises: ROM/Strengthening   UBE (Upper Arm Bike) L1 2 minutes each way   cues to use L UE more to decrease pain.      Shoulder Exercises: Stretch   Corner Stretch 2 reps;20 seconds   Corner Stretch Limitations bilateral, low hands    Internal Rotation Stretch 20 seconds  2 reps    Internal Rotation Stretch Limitations towel stretch      Moist Heat Therapy   Number Minutes Moist Heat 15 Minutes   Moist Heat Location Shoulder  right shoulder and neck      Manual Therapy   Manual Therapy Soft tissue mobilization   Soft tissue mobilization right and left  scalenes, upper trap, right  pec                   PT Short Term Goals - 10/14/16 1042  PT SHORT TERM GOAL #1   Title Pt to demo at least 10 deg increase in GHJ AROM in flexion and abduction by 7/20   Time 4   Period Weeks   Status Achieved     PT SHORT TERM GOAL #2   Title Pt will verbalize increased use of R upper extremity in daily activities such as washing dishes and other activities that require low GHJ ROM   Time 4   Period Weeks   Status Achieved           PT Long Term Goals - 10/08/16 1110      PT LONG TERM GOAL #1   Title FOTO to 36% limitation to indicate significant improvement in functional ability by 8/17   Baseline 54% limitation at eval   Time 8   Period Weeks   Status New     PT LONG TERM GOAL #2   Title Pt will be able to use her R hand to brush her hair for improved self care ability   Baseline uses L at eval   Time 8   Period Weeks   Status New     PT LONG TERM GOAL #3   Title Pt will be able to use R arm to carry 5lb grocery bag and lift the bag onto the counter pain <=3/10   Baseline uses L at eval    Time 8   Period Weeks   Status New     PT LONG TERM GOAL #4   Title GHJ AROM flexion and abduction to at least 100 deg to demo improved functional ROM   Baseline see flowsheet   Time 8   Period Weeks   Status New     PT LONG TERM GOAL #5   Title Pt will be able to reach behind to at least L3 for improved function in self cleaning and dressing   Baseline unable due to pain at eval   Time 8   Period Weeks   Status New               Plan - 10/19/16 0910    Clinical Impression Statement Pt reports soft tissue work and HMP helpful last treatment. She reports less pain with using RUE for combing hair. Her pain easily increases with AAROM exercises and cues are required to maintain low levels of pain. She feels neck pain every time she coughs or yawns. She has difficulty relaxing in supine for soft tissue work. Her right neck and upper traps are tender to palpation which she reports decreased with STW. Moved to sitting for HMP at end of session.    PT Next Visit Plan PROM, decrease cervical tightness, AAROM, try more manual in sitting, consider [place] or [place]    PT Home Exercise Plan upper trap stretch, door pec stretch-low, scapular retraction, pendulums, supine cane    Consulted and Agree with Plan of Care Patient      Patient will benefit from skilled therapeutic intervention in order to improve the following deficits and impairments:  Decreased range of motion, Impaired UE functional use, Increased muscle spasms, Decreased activity tolerance, Pain, Impaired flexibility, Decreased mobility, Decreased strength, Postural dysfunction, Impaired sensation  Visit Diagnosis: Chronic right shoulder pain  Stiffness of right shoulder, not elsewhere classified     Problem List Patient Active Problem List   Diagnosis Date Noted  . Estrogen deficiency 03/30/2016  . Rhinitis, allergic   . Pre-diabetes   . Angioedema 09/24/2014  . HTN (  hypertension) 09/24/2014  . HLD (hyperlipidemia)  09/24/2014  . GERD (gastroesophageal reflux disease) 09/24/2014  . OA (osteoarthritis) 09/24/2014  . Swallowing difficulty   . PULMONARY NODULE 03/21/2008  . CHEST PAIN 03/21/2008  . NEOPLASM, MALIGNANT, BREAST, HX OF 03/21/2008    Dorene Ar, PTA 10/19/2016, 10:03 AM  [facility] [address] [place], [place], [address] Phone: [phone]   Fax:  [phone]  Name: Eustolia Drennen MRN: [id number] Date of Birth: [birth year]

## 2016-10-23 ENCOUNTER — Encounter: Payer: Self-pay | Admitting: Physical Therapy

## 2016-10-23 ENCOUNTER — Ambulatory Visit: Payer: Medicare HMO | Admitting: Physical Therapy

## 2016-10-23 DIAGNOSIS — G8929 Other chronic pain: Secondary | ICD-10-CM

## 2016-10-23 DIAGNOSIS — M25511 Pain in right shoulder: Secondary | ICD-10-CM | POA: Diagnosis not present

## 2016-10-23 DIAGNOSIS — M25611 Stiffness of right shoulder, not elsewhere classified: Secondary | ICD-10-CM

## 2016-10-23 NOTE — Therapy (Signed)
[place], [place], [address] Phone: [phone]   Fax:  [phone]  Physical Therapy Treatment  Patient Details  Name: [name] Cooper MRN: [id number] Date of Birth: 12/23/1950 Referring Provider: Marchia Bond, MD  Encounter Date: 10/23/2016      PT End of Session - 10/23/16 0755    Visit Number 5   Number of Visits 17   Date for PT Re-Evaluation 12/04/16   Authorization Type [employer] MCR- KX at visit 15   PT Start Time 0800   PT Stop Time 0839   PT Time Calculation (min) 39 min   Activity Tolerance Patient tolerated treatment well   Behavior During Therapy [facility] for tasks assessed/performed      Past Medical History:  Diagnosis Date  . Arthritis    "down the right side of my body" (09/24/2014)  . Asthma    proair inhaler  . Breast cancer, right breast (HCC)    S/P mastectomy  . Chronic lower back pain   . GERD (gastroesophageal reflux disease)   . 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 Hyperlipemia   . Hypertension   . 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 ABDOMINAL HYSTERECTOMY    . APPENDECTOMY    . BREAST BIOPSY Right 1996  . BREAST LUMPECTOMY Right 1996  . MASTECTOMY Right 1996  . TONSILLECTOMY      There were no vitals filed for this visit.      Subjective Assessment - 10/23/16 0758    Subjective Reports shoulder feels pretty good this morning. Denies pain into arm and reports decreased use of pain medications.    Patient Stated Goals brush hair, do regular stuff, use R hand   Currently in Pain? No/denies   Aggravating Factors  reaching too high                         [facility] Adult PT Treatment/Exercise - 10/23/16 0001      Shoulder Exercises: Standing   Extension Limitations yellow tband-PT tactile cuing for upper trap inhibition   Row 15 reps   Theraband Level (Shoulder Row) Level 2 (Red)    Retraction Limitations standing scapular retraction     Shoulder Exercises: Pulleys   Flexion Limitations fwd flexion 2 min     Shoulder Exercises: ROM/Strengthening   UBE (Upper Arm Bike) L1 2 minutes each way      Manual Therapy   Manual Therapy Myofascial release;Taping   Soft tissue mobilization IASTM R upper trap   Myofascial Release ischemic release R upper trap   Passive ROM scapular mobilizations paired with upper trap stretch   McConnell upper trap inhibition-R                PT Education - 10/23/16 0840    Education provided Yes   Education Details exercise form/rationale, HEP, retraining shoulder movement, soreness with activities   Person(s) Educated Patient   Methods Explanation;Demonstration;Tactile cues;Verbal cues   Comprehension Verbalized understanding;Returned demonstration;Verbal cues required;Tactile cues required;Need further instruction          PT Short Term Goals - 10/14/16 1042      PT SHORT TERM GOAL #1   Title Pt to demo at least 10 deg increase in GHJ AROM in flexion and abduction by 7/20   Time 4   Period Weeks   Status Achieved  PT SHORT TERM GOAL #2   Title Pt will verbalize increased use of R upper extremity in daily activities such as washing dishes and other activities that require low GHJ ROM   Time 4   Period Weeks   Status Achieved           PT Long Term Goals - 10/08/16 1110      PT LONG TERM GOAL #1   Title FOTO to 36% limitation to indicate significant improvement in functional ability by 8/17   Baseline 54% limitation at eval   Time 8   Period Weeks   Status New     PT LONG TERM GOAL #2   Title Pt will be able to use her R hand to brush her hair for improved self care ability   Baseline uses L at eval   Time 8   Period Weeks   Status New     PT LONG TERM GOAL #3   Title Pt will be able to use R arm to carry 5lb grocery bag and lift the bag onto the counter pain <=3/10   Baseline uses L at eval    Time 8   Period Weeks   Status New     PT LONG TERM GOAL #4   Title GHJ AROM flexion and abduction to at least 100 deg to demo improved functional ROM   Baseline see flowsheet   Time 8   Period Weeks   Status New     PT LONG TERM GOAL #5   Title Pt will be able to reach behind to at least L3 for improved function in self cleaning and dressing   Baseline unable due to pain at eval   Time 8   Period Weeks   Status New               Plan - 10/23/16 0947    Clinical Impression Statement Pt with significant difficulty inhibiting upper trap in all shoulder movements, heavy verbal and tactile cuing during exercises. Pt reports shoulder feels pretty good. Educated on soreness to be expected with exercises and importance of training shoulder movmeent to decrease upper trap use   PT Treatment/Interventions ADLs/Self Care Home Management;Cryotherapy;Electrical Stimulation;Iontophoresis 4mg /ml Dexamethasone;Functional mobility training;Ultrasound;Moist Heat;Traction;Therapeutic activities;Therapeutic exercise;Neuromuscular re-education;Patient/family education;Passive range of motion;Manual techniques;Dry needling;Taping   PT Next Visit Plan PROM, decrease cervical tightness, AAROM, try more manual in sitting, consider [place] or [place]    PT Home Exercise Plan upper trap stretch, door pec stretch-low, scapular retraction, pendulums, supine cane    Consulted and Agree with Plan of Care Patient      Patient will benefit from skilled therapeutic intervention in order to improve the following deficits and impairments:  Decreased range of motion, Impaired UE functional use, Increased muscle spasms, Decreased activity tolerance, Pain, Impaired flexibility, Decreased mobility, Decreased strength, Postural dysfunction, Impaired sensation  Visit Diagnosis: Chronic right shoulder pain  Stiffness of right shoulder, not elsewhere classified     Problem List Patient Active Problem List   Diagnosis  Date Noted  . Estrogen deficiency 03/30/2016  . Rhinitis, allergic   . Pre-diabetes   . Angioedema 09/24/2014  . HTN (hypertension) 09/24/2014  . HLD (hyperlipidemia) 09/24/2014  . GERD (gastroesophageal reflux disease) 09/24/2014  . OA (osteoarthritis) 09/24/2014  . Swallowing difficulty   . PULMONARY NODULE 03/21/2008  . CHEST PAIN 03/21/2008  . NEOPLASM, MALIGNANT, BREAST, HX OF 03/21/2008   Lakira Ogando C. Calea Hribar PT, DPT 10/23/16 8:42 AM   [facility] [address] [place], [place], [address] Phone: [phone]   Fax:  [phone]  Name: [name] Cooper MRN: [id number] Date of Birth: 26-Jun-1950

## 2016-10-27 ENCOUNTER — Encounter: Payer: Self-pay | Admitting: Physical Therapy

## 2016-10-27 ENCOUNTER — Other Ambulatory Visit: Payer: Self-pay | Admitting: Family Medicine

## 2016-10-27 ENCOUNTER — Ambulatory Visit: Payer: Medicare HMO | Admitting: Physical Therapy

## 2016-10-27 DIAGNOSIS — M25511 Pain in right shoulder: Principal | ICD-10-CM

## 2016-10-27 DIAGNOSIS — N6320 Unspecified lump in the left breast, unspecified quadrant: Secondary | ICD-10-CM

## 2016-10-27 DIAGNOSIS — G8929 Other chronic pain: Secondary | ICD-10-CM

## 2016-10-27 DIAGNOSIS — M25611 Stiffness of right shoulder, not elsewhere classified: Secondary | ICD-10-CM

## 2016-10-27 NOTE — Therapy (Signed)
[place], [place], [address] Phone: [phone]   Fax:  [phone]  Physical Therapy Treatment  Patient Details  Name: [name] Cooper MRN: [id number] Date of Birth: [birth year] Referring Provider: Marchia Bond, MD  Encounter Date: 10/27/2016      PT End of Session - 10/27/16 1110    Visit Number 6   Number of Visits 17   Date for PT Re-Evaluation 12/04/16   PT Start Time 1017   PT Stop Time 1100   PT Time Calculation (min) 43 min   Activity Tolerance Patient tolerated treatment well   Behavior During Therapy [facility] for tasks assessed/performed      Past Medical History:  Diagnosis Date  . Arthritis    "down the right side of my body" (09/24/2014)  . Asthma    proair inhaler  . Breast cancer, right breast (HCC)    S/P mastectomy  . Chronic lower back pain   . GERD (gastroesophageal reflux disease)   . 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 Hyperlipemia   . Hypertension   . 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 ABDOMINAL HYSTERECTOMY    . APPENDECTOMY    . BREAST BIOPSY Right 1996  . BREAST LUMPECTOMY Right 1996  . MASTECTOMY Right 1996  . TONSILLECTOMY      There were no vitals filed for this visit.      Subjective Assessment - 10/27/16 1022    Subjective Able to lift arm higher.  See's MD tomorrow.  No pain at rest.  Not shooting into arm anymore.  Uses less medicine to control pain   Currently in Pain? Yes   Pain Score 2    Pain Location Shoulder   Pain Orientation Right   Pain Descriptors / Indicators Dull;Throbbing   Pain Type Chronic pain   Pain Radiating Towards No   Pain Frequency Intermittent   Aggravating Factors  reaching too high,, putting dishes in cabinet   Pain Relieving Factors PT, exercises,    Effect of Pain on Daily Activities gets help from Granddaughter to carry groceries.                            [place] Adult PT Treatment/Exercise - 10/27/16 0001      Self-Care   Self-Care Posture   Posture sitting ed     Therapeutic Activites    Therapeutic Activities Other Therapeutic Activities   Other Therapeutic Activities 5 LB carry and placed on counter     Neck Exercises: Seated   Neck Retraction 5 reps   Neck Retraction Limitations fatigued neck anterior     Shoulder Exercises: Standing   Internal Rotation --  1 rep   Internal Rotation Limitations able to reach bra   Flexion 5 reps   Flexion Limitations sitting,  scaption noted,  no pain   Retraction 5 reps  cues     Shoulder Exercises: Stretch   Other Shoulder Stretches cervical Rotations 3 X with light manual distal to proximal upper trap inhibition. right/left     Manual Therapy   Manual Therapy Myofascial release;Soft tissue mobilization  PEC, anterior lateral neck followed by gentle strumming   Soft tissue mobilization IASTM upper traps shoulder   Myofascial Release anterior nech over right PEC,  upper traps   Passive ROM scapular mobilization  RT>LT stiff cues to  relax needed                PT Education - 10/27/16 1109    Education provided Yes   Education Details sitting posture, anatomy    Person(s) Educated Patient   Methods Explanation   Comprehension Verbalized understanding;Returned demonstration          PT Short Term Goals - 10/27/16 1028      PT SHORT TERM GOAL #1   Title Pt to demo at least 10 deg increase in GHJ AROM in flexion and abduction by 7/20   Time 4   Period Weeks   Status Achieved     PT SHORT TERM GOAL #2   Title Pt will verbalize increased use of R upper extremity in daily activities such as washing dishes and other activities that require low GHJ ROM   Time 4   Period Weeks   Status Achieved           PT Long Term Goals - 10/27/16 1028      PT LONG TERM GOAL #1   Title FOTO to 36% limitation to indicate significant  improvement in functional ability by 8/17   Time 8   Period Weeks   Status Unable to assess     PT LONG TERM GOAL #2   Title Pt will be able to use her R hand to brush her hair for improved self care ability   Baseline yes   Time 8   Period Weeks   Status Achieved     PT LONG TERM GOAL #3   Title Pt will be able to use R arm to carry 5lb grocery bag and lift the bag onto the counter pain <=3/10   Baseline Able to do  no pain   Time 8   Period Weeks   Status Achieved     PT LONG TERM GOAL #4   Title GHJ AROM flexion and abduction to at least 100 deg to demo improved functional ROM   Baseline scaption to 100,  extra time no pain   Time 8   Period Weeks   Status On-going     PT LONG TERM GOAL #5   Title Pt will be able to reach behind to at least L3 for improved function in self cleaning and dressing   Baseline able to hook bra in back now   Time 8   Period Weeks   Status Achieved               Plan - 10/27/16 1111    Clinical Impression Statement Patient is doing well with PT.  She thinks she may need only 1 more visit. ROM improved,  pain ins now intermittant.  She can carry 5 LBS without pain and can lift to counter without pain. LTG#2, #3, #5 met.  Pain 2/10 post session due to manual PT. She declined the need for modalities.     PT Treatment/Interventions ADLs/Self Care Home Management;Cryotherapy;Electrical Stimulation;Iontophoresis 3m/ml Dexamethasone;Functional mobility training;Ultrasound;Moist Heat;Traction;Therapeutic activities;Therapeutic exercise;Neuromuscular re-education;Patient/family education;Passive range of motion;Manual techniques;Dry needling;Taping   PT Next Visit Plan See what MD said.  Patient wants 1 more visit.  FOTO   PT Home Exercise Plan upper trap stretch, door pec stretch-low, scapular retraction, pendulums, supine cane    Consulted and Agree with Plan of Care Patient      Patient will benefit from skilled therapeutic intervention in  order to improve the following deficits and impairments:  Decreased range of motion, Impaired UE  functional use, Increased muscle spasms, Decreased activity tolerance, Pain, Impaired flexibility, Decreased mobility, Decreased strength, Postural dysfunction, Impaired sensation  Visit Diagnosis: Chronic right shoulder pain  Stiffness of right shoulder, not elsewhere classified     Problem List Patient Active Problem List   Diagnosis Date Noted  . Estrogen deficiency 03/30/2016  . Rhinitis, allergic   . Pre-diabetes   . Angioedema 09/24/2014  . HTN (hypertension) 09/24/2014  . HLD (hyperlipidemia) 09/24/2014  . GERD (gastroesophageal reflux disease) 09/24/2014  . OA (osteoarthritis) 09/24/2014  . Swallowing difficulty   . PULMONARY NODULE 03/21/2008  . CHEST PAIN 03/21/2008  . NEOPLASM, MALIGNANT, BREAST, HX OF 03/21/2008    Khaliyah Northrop PTA 10/27/2016, 11:16 AM  [facility] [address] [place], [place], [address] Phone: [phone]   Fax:  [phone]  Name: [name] Cooper MRN: [id number] Date of Birth: 1950/09/10

## 2016-10-29 ENCOUNTER — Encounter: Payer: Self-pay | Admitting: Physical Therapy

## 2016-10-29 ENCOUNTER — Ambulatory Visit: Payer: Medicare HMO | Admitting: Physical Therapy

## 2016-10-29 DIAGNOSIS — M25511 Pain in right shoulder: Principal | ICD-10-CM

## 2016-10-29 DIAGNOSIS — M25611 Stiffness of right shoulder, not elsewhere classified: Secondary | ICD-10-CM

## 2016-10-29 DIAGNOSIS — G8929 Other chronic pain: Secondary | ICD-10-CM

## 2016-10-29 NOTE — Patient Instructions (Signed)
Strengthening: Resisted Diagonal Extension    Hold tubing with right arm across body above shoulder, palm down. Gently pull down and away from body, rotating arm to palm up.  Repeat __1-2__ times per set. Do _1___ sets per session. Do __1__ sessions per day.  MODIFIED  :         Hold band with both hands,  Right arm moved down and left moves up and out.  DO in bed with knees bent.  Progressed to sitting / standing. 1- http://orth.exer.us/845   Copyright  VHI. All rights reserved.  Strengthening: Resisted Diagonal    Hold tubing with right arm down across body, thumb pointing back. Pull arm up and out, rotating arm to palm forward. Repeat __10__ times per set. Do 1-2____ sets per session. Do __1__ sessions per day.   MODIFIED  :     Hold band with both hands .  Move right arm up and out and move left arm down and back.   Do in bed with knees flexed.  Progress to sitting/  standing  http://orth.exer.us/841   Copyright  VHI. All rights reserved.  Strengthening: Resisted Extension    Hold tubing in right hand, arm forward. Pull arm back, elbow straight. Repeat __10__ times per set. Do _1-2___ sets per session. Do ___1_ sessions per day. 3-4 x a week  http://orth.exer.us/833   Copyright  VHI. All rights reserved.  Shoulder Retraction / External Rotation With Band    With band held in front, keep elbows at side while rotating hands apart and pulling shoulder blades back. Hold 1-5____ seconds. Repeat ___10_ times. Do __1__ sessions per day.  do 3-4 X a week.  Copyright  VHI. All rights reserved.

## 2016-10-29 NOTE — Therapy (Signed)
[place], [place], [address] Phone: [phone]   Fax:  [phone]  Physical Therapy Treatment/Discharge Summary  Patient Details  Name: [name] Cooper MRN: [id number] Date of Birth: [birth year] Referring Provider: Marchia Bond, MD  Encounter Date: 10/29/2016      PT End of Session - 10/29/16 1301    Visit Number 7   Number of Visits 17   Date for PT Re-Evaluation 12/04/16   PT Start Time 1147   PT Stop Time 1230   PT Time Calculation (min) 43 min   Behavior During Therapy [facility] for tasks assessed/performed      Past Medical History:  Diagnosis Date  . Arthritis    "down the right side of my body" (09/24/2014)  . Asthma    proair inhaler  . Breast cancer, right breast (HCC)    S/P mastectomy  . Chronic lower back pain   . GERD (gastroesophageal reflux disease)   . 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 Hyperlipemia   . Hypertension   . 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 ABDOMINAL HYSTERECTOMY    . APPENDECTOMY    . BREAST BIOPSY Right 1996  . BREAST LUMPECTOMY Right 1996  . MASTECTOMY Right 1996  . TONSILLECTOMY      There were no vitals filed for this visit.      Subjective Assessment - 10/29/16 1149    Subjective MD released her. No pain at rest.  I understand the exercises   Currently in Pain? No/denies   Pain Score --  gets up to 2/10   Pain Location Shoulder   Pain Orientation Right   Pain Descriptors / Indicators Dull   Pain Type Chronic pain   Pain Radiating Towards No   Pain Frequency Intermittent   Aggravating Factors  reaching too high, putting diches in cabinet, mopping the floor   Pain Relieving Factors Stretches   Effect of Pain on Daily Activities Limits heavier lifting            [facility] PT Assessment - 10/29/16 0001      Observation/Other Assessments   Focus on Therapeutic Outcomes  (FOTO)  67% ability, 33% limitation.     AROM   Right Shoulder Flexion 126 Degrees   Right Shoulder ABduction 135 Degrees  ER 75,  extension 72     Strength   Overall Strength Comments 4/5 shoulder grossly  pain with flexion and abduction 2/10   Right/Left hand --  55 LBS average right                     [facility] Adult PT Treatment/Exercise - 10/29/16 0001      Shoulder Exercises: Supine   Horizontal ABduction 10 reps   Theraband Level (Shoulder Horizontal ABduction) Level 1 (Yellow)   Horizontal ABduction Limitations HEP,  cues    Other Supine Exercises diagonals with yellow band 10 X each side , cues initially , HEP     Shoulder Exercises: Standing   Extension 10 reps   Theraband Level (Shoulder Extension) Level 1 (Yellow)   Retraction 5 reps  2 sets 1 set sitting.     Shoulder Exercises: Stretch   Other Shoulder Stretches Pec stretch at door way 2 X 20 reps, right only     Manual Therapy   Soft tissue mobilization right shoulder   Passive ROM Scapular mobs,  RT shoulder PROM     Neck Exercises: Stretches   Upper Trapezius Stretch 3 reps;20 seconds                PT Education - 10/29/16 1300    Education provided Yes   Education Details HEP strengthening   Person(s) Educated Patient   Methods Explanation;Demonstration;Tactile cues;Verbal cues;Handout   Comprehension Verbalized understanding;Returned demonstration          PT Short Term Goals - 10/29/16 1304      PT SHORT TERM GOAL #1   Title Pt to demo at least 10 deg increase in GHJ AROM in flexion and abduction by 7/20   Time 4   Period Weeks   Status Achieved     PT SHORT TERM GOAL #2   Title Pt will verbalize increased use of R upper extremity in daily activities such as washing dishes and other activities that require low GHJ ROM   Time 4   Period Weeks   Status Achieved           PT Long Term Goals - 10/29/16 1304      PT LONG TERM GOAL #1   Title FOTO to 36%  limitation to indicate significant improvement in functional ability by 8/17   Baseline 33 % limitation   Time 8   Period Weeks   Status Achieved     PT LONG TERM GOAL #2   Title Pt will be able to use her R hand to brush her hair for improved self care ability   Baseline yes   Time 8   Status Achieved     PT LONG TERM GOAL #3   Title Pt will be able to use R arm to carry 5lb grocery bag and lift the bag onto the counter pain <=3/10   Baseline Able to do  no pain   Time 8   Period Weeks   Status Achieved     PT LONG TERM GOAL #4   Title GHJ AROM flexion and abduction to at least 100 deg to demo improved functional ROM   Baseline 135   Time 8   Period Weeks   Status On-going     PT LONG TERM GOAL #5   Title Pt will be able to reach behind to at least L3 for improved function in self cleaning and dressing   Baseline able to hook bra in back now   Time 8   Period Weeks   Status Achieved               Plan - 10/29/16 1301    Clinical Impression Statement Last visit today.  No shoulder pain at rest.  Independent with her HEP.  MD was surprised she did so well.  FOTO score exceeded expectations  67% ability.  All goals met.    PT Treatment/Interventions ADLs/Self Care Home Management;Cryotherapy;Electrical Stimulation;Iontophoresis 5m/ml Dexamethasone;Functional mobility training;Ultrasound;Moist Heat;Traction;Therapeutic activities;Therapeutic exercise;Neuromuscular re-education;Patient/family education;Passive range of motion;Manual techniques;Dry needling;Taping   PT Next Visit Plan Discharge to HEP   PT Home Exercise Plan upper trap stretch, door pec stretch-low, scapular retraction, pendulums, supine cane ,  supine band   Consulted and Agree with Plan of Care Patient      Patient will benefit from skilled therapeutic intervention in order to improve the following deficits and impairments:  Decreased range of motion, Impaired UE functional use, Increased muscle  spasms, Decreased activity tolerance, Pain, Impaired flexibility, Decreased mobility, Decreased strength, Postural dysfunction, Impaired sensation  Visit  Diagnosis: Chronic right shoulder pain  Stiffness of right shoulder, not elsewhere classified     Problem List Patient Active Problem List   Diagnosis Date Noted  . Estrogen deficiency 03/30/2016  . Rhinitis, allergic   . Pre-diabetes   . Angioedema 09/24/2014  . HTN (hypertension) 09/24/2014  . HLD (hyperlipidemia) 09/24/2014  . GERD (gastroesophageal reflux disease) 09/24/2014  . OA (osteoarthritis) 09/24/2014  . Swallowing difficulty   . PULMONARY NODULE 03/21/2008  . CHEST PAIN 03/21/2008  . NEOPLASM, MALIGNANT, BREAST, HX OF 03/21/2008    HARRIS,KAREN PTA 10/29/2016, 1:07 PM  [facility] [address] [place], [place], [address] Phone: [phone]   Fax:  [phone]  Name: [name] Cooper MRN: [id number] Date of Birth: [birth year]   PHYSICAL THERAPY DISCHARGE SUMMARY  Visits from Start of Care: 7  Current functional level related to goals / functional outcomes: See above   Remaining deficits: See above   Education / Equipment: Anatomy of condition, POC, HEP, exercise form/rationale  Plan: Patient agrees to discharge.  Patient goals were met. Patient is being discharged due to meeting the stated rehab goals.  ?????    Jessica C. Hightower PT, DPT 10/29/16 3:29 PM

## 2016-11-02 ENCOUNTER — Other Ambulatory Visit: Payer: Self-pay | Admitting: Family Medicine

## 2016-11-02 ENCOUNTER — Ambulatory Visit
Admission: RE | Admit: 2016-11-02 | Discharge: 2016-11-02 | Disposition: A | Discharge status: Home / Self Care | Payer: Medicare HMO | Source: Ambulatory Visit | Attending: Family Medicine | Admitting: Family Medicine

## 2016-11-02 DIAGNOSIS — N6320 Unspecified lump in the left breast, unspecified quadrant: Secondary | ICD-10-CM

## 2016-11-03 ENCOUNTER — Encounter: Payer: Medicare HMO | Admitting: Physical Therapy

## 2016-11-05 ENCOUNTER — Encounter: Payer: Medicare HMO | Admitting: Physical Therapy

## 2016-11-09 ENCOUNTER — Other Ambulatory Visit: Payer: Self-pay | Admitting: Family Medicine

## 2016-11-09 DIAGNOSIS — N6320 Unspecified lump in the left breast, unspecified quadrant: Secondary | ICD-10-CM

## 2016-11-09 NOTE — Progress Notes (Signed)
Future order entered for a left diagnostic mammogram. Follow-up recommendation from recent screening mammogram

## 2016-11-10 ENCOUNTER — Encounter: Payer: Medicare HMO | Admitting: Physical Therapy

## 2016-11-12 ENCOUNTER — Encounter: Payer: Medicare HMO | Admitting: Physical Therapy

## 2016-11-12 ENCOUNTER — Other Ambulatory Visit: Payer: Self-pay | Admitting: Family Medicine

## 2016-11-12 DIAGNOSIS — N6320 Unspecified lump in the left breast, unspecified quadrant: Secondary | ICD-10-CM

## 2016-11-17 ENCOUNTER — Encounter: Payer: Medicare HMO | Admitting: Physical Therapy

## 2016-11-19 ENCOUNTER — Encounter: Payer: Medicare HMO | Admitting: Physical Therapy

## 2016-12-22 ENCOUNTER — Ambulatory Visit: Payer: Medicare HMO | Admitting: Family Medicine

## 2016-12-22 ENCOUNTER — Encounter: Payer: Self-pay | Admitting: Family Medicine

## 2016-12-22 ENCOUNTER — Ambulatory Visit (INDEPENDENT_AMBULATORY_CARE_PROVIDER_SITE_OTHER): Payer: Medicare HMO | Admitting: Family Medicine

## 2016-12-22 VITALS — BP 123/72 | HR 84 | Temp 97.9°F | Resp 14 | Ht 66.0 in | Wt 280.0 lb

## 2016-12-22 DIAGNOSIS — E119 Type 2 diabetes mellitus without complications: Secondary | ICD-10-CM | POA: Diagnosis not present

## 2016-12-22 DIAGNOSIS — N390 Urinary tract infection, site not specified: Secondary | ICD-10-CM | POA: Diagnosis not present

## 2016-12-22 DIAGNOSIS — Z23 Encounter for immunization: Secondary | ICD-10-CM

## 2016-12-22 LAB — POCT URINALYSIS DIP (DEVICE)
BILIRUBIN URINE: NEGATIVE
Glucose, UA: NEGATIVE mg/dL
HGB URINE DIPSTICK: NEGATIVE
Ketones, ur: NEGATIVE mg/dL
NITRITE: POSITIVE — AB
PH: 6 (ref 5.0–8.0)
Protein, ur: NEGATIVE mg/dL
SPECIFIC GRAVITY, URINE: 1.02 (ref 1.005–1.030)
UROBILINOGEN UA: 0.2 mg/dL (ref 0.0–1.0)

## 2016-12-22 LAB — POCT GLYCOSYLATED HEMOGLOBIN (HGB A1C): Hemoglobin A1C: 6.1

## 2016-12-22 MED ORDER — CIPROFLOXACIN HCL 500 MG PO TABS
500.0000 mg | ORAL_TABLET | Freq: Two times a day (BID) | ORAL | 0 refills | Status: DC
Start: 2016-12-22 — End: 2017-01-05

## 2016-12-22 MED ORDER — CIPROFLOXACIN HCL 500 MG PO TABS: 500.0000 mg | ORAL_TABLET | Freq: Two times a day (BID) | ORAL | 0 refills | Status: DC

## 2016-12-22 MED ORDER — TRIAMTERENE-HCTZ 37.5-25 MG PO TABS: 1.0000 | ORAL_TABLET | Freq: Every day | ORAL | 1 refills | Status: DC

## 2016-12-22 MED ORDER — BUDESONIDE-FORMOTEROL FUMARATE 80-4.5 MCG/ACT IN AERO: 2.0000 | INHALATION_SPRAY | Freq: Two times a day (BID) | RESPIRATORY_TRACT | 3 refills | Status: DC

## 2016-12-22 MED ORDER — PRAVASTATIN SODIUM 40 MG PO TABS: 40.0000 mg | ORAL_TABLET | Freq: Every day | ORAL | 3 refills | Status: DC

## 2016-12-22 MED ORDER — HYDRALAZINE HCL 25 MG PO TABS: 25.0000 mg | ORAL_TABLET | Freq: Three times a day (TID) | ORAL | 2 refills | Status: DC

## 2016-12-22 NOTE — Progress Notes (Signed)
Patient ID: [name] Cooper, female    DOB: 1951/03/25, 66 y.o.   MRN: [id number]  PCP: Scot Jun, FNP  Chief Complaint  Patient presents with  . Follow-up    3 MONTH    Subjective:  HPI [name] Cooper is a 66 y.o. female, current smoker, with hypertension, prediabetes, and frequent UTI's presents fora routine 3 month follow-up. [name] Cooper reports recently increasing physical activity and dietary changes in efforts to loose weight and manage chronic conditions. Current Body mass index is 45.19 kg/m. [name] Cooper has prediabetes and her last hemoglobin A1c 6.4 3 months prior. She takes metformin 500 mg twice daily for diabetes management. [name] Cooper suffers from hypertension. She reports no routine home monitoring of blood pressure. In office blood pressure readings have remained well controlled with current medication regimen. [name] Cooper is attempting to decrease the number of cigarettes she smokes per day in effort to achieve smoking cessation. [name] Cooper reports urinary frequency and dysuria. During her last office visit, she was diagnosed and treated for UTI. Denies nausea, vomiting, chest pain, shortness of breath, or headache. Social History   Social History  . Marital status: Widowed    Spouse name: N/A  . Number of children: N/A  . Years of education: N/A   Occupational History  . Not on file.   Social History Main Topics  . Smoking status: Current Every Day Smoker    Packs/day: 0.25    Years: 43.00    Types: Cigarettes  . Smokeless tobacco: Never Used     Comment: 09/24/2014 "I've quit smoking 3 times"  . Alcohol use No  . Drug use: No     Comment: 09/24/2014 "last crack was in 2013"  . Sexual activity: Not on file   Other Topics Concern  . Not on file   Social History Narrative  . No narrative on file    Family History  Problem Relation Age of Onset  . Colon cancer Neg Hx    Review of Systems See HPI  Patient Active Problem List   Diagnosis Date Noted  . Estrogen  deficiency 03/30/2016  . Rhinitis, allergic   . Pre-diabetes   . Angioedema 09/24/2014  . HTN (hypertension) 09/24/2014  . HLD (hyperlipidemia) 09/24/2014  . GERD (gastroesophageal reflux disease) 09/24/2014  . OA (osteoarthritis) 09/24/2014  . Swallowing difficulty   . PULMONARY NODULE 03/21/2008  . CHEST PAIN 03/21/2008  . NEOPLASM, MALIGNANT, BREAST, HX OF 03/21/2008    Allergies  Allergen Reactions  . Codeine     REACTION: MAKES HER SLEEPY    Prior to Admission medications   Medication Sig Start Date End Date Taking? Authorizing Provider  acetaminophen (TYLENOL) 650 MG CR tablet Take 1,300 mg by mouth every 8 (eight) hours as needed for pain. Reported on 09/23/2015   Yes [provider]  albuterol (PROVENTIL HFA;VENTOLIN HFA) 108 (90 Base) MCG/ACT inhaler Inhale 2 puffs into the lungs every 6 (six) hours as needed for wheezing or shortness of breath. 09/21/16  Yes Scot Jun, FNP  budesonide-formoterol (SYMBICORT) 80-4.5 MCG/ACT inhaler Inhale 2 puffs into the lungs 2 (two) times daily. 09/21/16  Yes Scot Jun, FNP  hydrALAZINE (APRESOLINE) 25 MG tablet TAKE 1 TABLET THREE TIMES DAILY 08/19/16  Yes Scot Jun, FNP  metFORMIN (GLUCOPHAGE) 500 MG tablet Take 1 tablet (500 mg total) by mouth 2 (two) times daily with a meal. 09/21/16  Yes Scot Jun, FNP  pravastatin (PRAVACHOL) 40 MG tablet Take 1 tablet (40 mg total) by  mouth daily. 03/18/16  Yes Micheline Chapman, NP  triamterene-hydrochlorothiazide (MAXZIDE-25) 37.5-25 MG tablet TAKE 1 TABLET EVERY DAY 08/19/16  Yes Scot Jun, FNP  doxycycline (VIBRAMYCIN) 100 MG capsule Take 1 capsule (100 mg total) by mouth 2 (two) times daily. Patient not taking: Reported on 10/08/2016 09/21/16   Scot Jun, FNP  traMADol (ULTRAM) 50 MG tablet Take 1-2 tablets (50-100 mg total) by mouth every 12 (twelve) hours as needed for severe pain. Patient not taking: Reported on 12/22/2016 09/21/16   Scot Jun, FNP    Past Medical, Surgical Family and Social History reviewed and updated.    Objective:   Today's Vitals   12/22/16 1514  BP: 123/72  Pulse: 84  Resp: 14  Temp: 97.9 F (36.6 C)  TempSrc: Oral  SpO2: 96%  Weight: 280 lb (127 kg)  Height: 5\' 6"  (1.676 m)    Wt Readings from Last 3 Encounters:  12/22/16 280 lb (127 kg)  09/23/16 297 lb 9.6 oz (135 kg)  09/21/16 297 lb (134.7 kg)    Physical Exam  Constitutional: She is oriented to person, place, and time. She appears well-developed and well-nourished.  HENT:  Head: Normocephalic and atraumatic.  Eyes: Pupils are equal, round, and reactive to light. Conjunctivae and EOM are normal.  Neck: Normal range of motion. Neck supple.  Cardiovascular: Normal rate, regular rhythm, normal heart sounds and intact distal pulses.   Pulmonary/Chest: Effort normal and breath sounds normal.  Musculoskeletal: Normal range of motion.  Neurological: She is alert and oriented to person, place, and time.  Skin: Skin is warm and dry.  Psychiatric: She has a normal mood and affect. Her behavior is normal. Judgment and thought content normal.   Assessment & Plan:  1. Type 2 diabetes mellitus without complication, without long-term current use of insulin (HCC) - POCT glycosylated hemoglobin (Hb A1C), 6.1.  -Continue metformin   2. Acute UTI (urinary tract infection) - Urine Culture -Ciprofloxacin 500 mg twice daily x 14 days   3. Need for influenza vaccination - Flu Vaccine QUAD 36+ mos IM  4. Hypertension, controlled  -continue current medication regimen   RTC: 6 months for follow-up or sooner if symptoms of UTI worsen or do not improve.  Carroll Sage. Kenton Kingfisher, MSN, FNP-C [facility]  [address] [place], [place] [address] [phone]

## 2016-12-22 NOTE — Patient Instructions (Addendum)
Ciprofloxacin 500 mg twice daily x 14 days with food to treat urinary tract infection.       Urinary Tract Infection, Adult A urinary tract infection (UTI) is an infection of any part of the urinary tract. The urinary tract includes the:  Kidneys.  Ureters.  Bladder.  Urethra.  These organs make, store, and get rid of pee (urine) in the body. Follow these instructions at home:  Take over-the-counter and prescription medicines only as told by your doctor.  If you were prescribed an antibiotic medicine, take it as told by your doctor. Do not stop taking the antibiotic even if you start to feel better.  Avoid the following drinks: ? Alcohol. ? Caffeine. ? Tea. ? Carbonated drinks.  Drink enough fluid to keep your pee clear or pale yellow.  Keep all follow-up visits as told by your doctor. This is important.  Make sure to: ? Empty your bladder often and completely. Do not to hold pee for long periods of time. ? Empty your bladder before and after sex. ? Wipe from front to back after a bowel movement if you are female. Use each tissue one time when you wipe. Contact a doctor if:  You have back pain.  You have a fever.  You feel sick to your stomach (nauseous).  You throw up (vomit).  Your symptoms do not get better after 3 days.  Your symptoms go away and then come back. Get help right away if:  You have very bad back pain.  You have very bad lower belly (abdominal) pain.  You are throwing up and cannot keep down any medicines or water. This information is not intended to replace advice given to you by your health care provider. Make sure you discuss any questions you have with your health care provider. Document Released: 09/23/2007 Document Revised: 09/12/2015 Document Reviewed: 02/25/2015 Elsevier Interactive Patient Education  2018 [place] Prediabetes-also called impaired glucose tolerance or impaired fasting glucose-is a  condition that causes blood sugar (blood glucose) levels to be higher than normal. Following a healthy diet can help to keep prediabetes under control. It can also help to lower the risk of type 2 diabetes and heart disease, which are increased in people who have prediabetes. Along with regular exercise, a healthy diet:  Promotes weight loss.  Helps to control blood sugar levels.  Helps to improve the way that the body uses insulin.  What do I need to know about this eating plan?  Use the glycemic index (GI) to plan your meals. The index tells you how quickly a food will raise your blood sugar. Choose low-GI foods. These foods take a longer time to raise blood sugar.  Pay close attention to the amount of carbohydrates in the food that you eat. Carbohydrates increase blood sugar levels.  Keep track of how many calories you take in. Eating the right amount of calories will help you to achieve a healthy weight. Losing about 7 percent of your starting weight can help to prevent type 2 diabetes.  You may want to follow a Mediterranean diet. This diet includes a lot of vegetables, lean meats or fish, whole grains, fruits, and healthy oils and fats. What foods can I eat? Grains Whole grains, such as whole-wheat or whole-grain breads, crackers, cereals, and pasta. Unsweetened oatmeal. Bulgur. Barley. Quinoa. Brown rice. Corn or whole-wheat flour tortillas or taco shells. Vegetables Lettuce. Spinach. Peas. Beets. Cauliflower. Cabbage. Broccoli. Carrots. Tomatoes. Squash. Eggplant. Herbs.  Peppers. Onions. Cucumbers. Brussels sprouts. Fruits Berries. Bananas. Apples. Oranges. Grapes. Papaya. Mango. Pomegranate. Kiwi. Grapefruit. Cherries. Meats and Other Protein Sources Seafood. Lean meats, such as chicken and [place] or lean cuts of pork and beef. Tofu. Eggs. Nuts. Beans. Dairy Low-fat or fat-free dairy products, such as yogurt, cottage cheese, and cheese. Beverages Water. Tea. Coffee. Sugar-free  or diet soda. Seltzer water. Milk. Milk alternatives, such as soy or almond milk. Condiments Mustard. Relish. Low-fat, low-sugar ketchup. Low-fat, low-sugar barbecue sauce. Low-fat or fat-free mayonnaise. Sweets and Desserts Sugar-free or low-fat pudding. Sugar-free or low-fat ice cream and other frozen treats. Fats and Oils Avocado. Walnuts. Olive oil. The items listed above may not be a complete list of recommended foods or beverages. Contact your dietitian for more options. What foods are not recommended? Grains Refined white flour and flour products, such as bread, pasta, snack foods, and cereals. Beverages Sweetened drinks, such as sweet iced tea and soda. Sweets and Desserts Baked goods, such as cake, cupcakes, pastries, cookies, and cheesecake. The items listed above may not be a complete list of foods and beverages to avoid. Contact your dietitian for more information. This information is not intended to replace advice given to you by your health care provider. Make sure you discuss any questions you have with your health care provider. Document Released: 08/21/2014 Document Revised: 09/12/2015 Document Reviewed: 05/02/2014 Elsevier Interactive Patient Education  2017 [employer].

## 2016-12-24 LAB — URINE CULTURE

## 2017-01-05 ENCOUNTER — Ambulatory Visit: Payer: Medicare HMO | Admitting: Sports Medicine

## 2017-01-05 ENCOUNTER — Ambulatory Visit (INDEPENDENT_AMBULATORY_CARE_PROVIDER_SITE_OTHER): Payer: Medicare HMO | Admitting: Sports Medicine

## 2017-01-05 ENCOUNTER — Encounter: Payer: Self-pay | Admitting: Sports Medicine

## 2017-01-05 DIAGNOSIS — M79675 Pain in left toe(s): Secondary | ICD-10-CM | POA: Diagnosis not present

## 2017-01-05 DIAGNOSIS — B351 Tinea unguium: Secondary | ICD-10-CM

## 2017-01-05 DIAGNOSIS — M79674 Pain in right toe(s): Secondary | ICD-10-CM

## 2017-01-05 DIAGNOSIS — E119 Type 2 diabetes mellitus without complications: Secondary | ICD-10-CM

## 2017-01-05 NOTE — Progress Notes (Signed)
Subjective: [name] Cooper is a 66 y.o. female patient with history of diabetes who returns to office today complaining of long, painful nails  while ambulating in shoes; unable to trim. Patient states that the glucose reading this morning was not recorded, last A1c 6.2. Last visit with PCP last month. Patient denies any new changes in medication or new problems. Patient denies any new cramping, numbness, burning or tingling in the legs.  Patient Active Problem List   Diagnosis Date Noted  . Estrogen deficiency 03/30/2016  . Rhinitis, allergic   . Pre-diabetes   . Angioedema 09/24/2014  . HTN (hypertension) 09/24/2014  . HLD (hyperlipidemia) 09/24/2014  . GERD (gastroesophageal reflux disease) 09/24/2014  . OA (osteoarthritis) 09/24/2014  . Swallowing difficulty   . PULMONARY NODULE 03/21/2008  . CHEST PAIN 03/21/2008  . NEOPLASM, MALIGNANT, BREAST, HX OF 03/21/2008   Current Outpatient Prescriptions on File Prior to Visit  Medication Sig Dispense Refill  . acetaminophen (TYLENOL) 650 MG CR tablet Take 1,300 mg by mouth every 8 (eight) hours as needed for pain. Reported on 09/23/2015    . albuterol (PROVENTIL HFA;VENTOLIN HFA) 108 (90 Base) MCG/ACT inhaler Inhale 2 puffs into the lungs every 6 (six) hours as needed for wheezing or shortness of breath. 1 Inhaler 3  . budesonide-formoterol (SYMBICORT) 80-4.5 MCG/ACT inhaler Inhale 2 puffs into the lungs 2 (two) times daily. 1 Inhaler 3  . hydrALAZINE (APRESOLINE) 25 MG tablet Take 1 tablet (25 mg total) by mouth 3 (three) times daily. 270 tablet 2  . metFORMIN (GLUCOPHAGE) 500 MG tablet Take 1 tablet (500 mg total) by mouth 2 (two) times daily with a meal. 180 tablet 3  . pravastatin (PRAVACHOL) 40 MG tablet Take 1 tablet (40 mg total) by mouth daily. 90 tablet 3  . traMADol (ULTRAM) 50 MG tablet Take 1-2 tablets (50-100 mg total) by mouth every 12 (twelve) hours as needed for severe pain. 30 tablet 0  . triamterene-hydrochlorothiazide  (MAXZIDE-25) 37.5-25 MG tablet Take 1 tablet by mouth daily. 90 tablet 1   No current facility-administered medications on file prior to visit.    Allergies  Allergen Reactions  . Codeine     REACTION: MAKES HER SLEEPY    Recent Results (from the past 2160 hour(s))  POCT urinalysis dip (device)     Status: Abnormal   Collection Time: 12/22/16  3:26 PM  Result Value Ref Range   Glucose, UA NEGATIVE NEGATIVE mg/dL   Bilirubin Urine NEGATIVE NEGATIVE   Ketones, ur NEGATIVE NEGATIVE mg/dL   Specific Gravity, Urine 1.020 1.005 - 1.030   Hgb urine dipstick NEGATIVE NEGATIVE   pH 6.0 5.0 - 8.0   Protein, ur NEGATIVE NEGATIVE mg/dL   Urobilinogen, UA 0.2 0.0 - 1.0 mg/dL   Nitrite POSITIVE (A) NEGATIVE   Leukocytes, UA LARGE (A) NEGATIVE    Comment: Biochemical Testing Only. Please order routine urinalysis from main lab if confirmatory testing is needed.  POCT glycosylated hemoglobin (Hb A1C)     Status: Abnormal   Collection Time: 12/22/16  3:30 PM  Result Value Ref Range   Hemoglobin A1C 6.1   Urine Culture     Status: None   Collection Time: 12/22/16  4:43 PM  Result Value Ref Range   Culture ESCHERICHIA COLI    Colony Count Greater than 100,000 CFU/mL    Organism ID, Bacteria ESCHERICHIA COLI       Susceptibility   Escherichia coli -  (no method available)    AMPICILLIN >=32 Resistant  AMOX/CLAVULANIC 8 Sensitive     AMPICILLIN/SULBACTAM >=32 Resistant     PIP/TAZO <=4 Sensitive     IMIPENEM <=0.25 Sensitive     CEFAZOLIN <=4 Not Reportable     CEFTRIAXONE <=1 Sensitive     CEFTAZIDIME <=1 Sensitive     CEFEPIME <=1 Sensitive     GENTAMICIN <=1 Sensitive     TOBRAMYCIN <=1 Sensitive     CIPROFLOXACIN <=0.25 Sensitive     LEVOFLOXACIN 0.5 Sensitive     NITROFURANTOIN <=16 Sensitive     TRIMETH/SULFA* <=20 Sensitive      * NR=NOT REPORTABLE,SEE COMMENTORAL therapy:A cefazolin MIC of <32 predicts susceptibility to the oral agents  cefaclor,cefdinir,cefpodoxime,cefprozil,cefuroxime,cephalexin,and loracarbef when used for therapy of uncomplicated UTIs due to E.coli,K.pneumomiae,and P.mirabilis. PARENTERAL therapy: A cefazolinMIC of >8 indicates resistance to parenteralcefazolin. An alternate test method must beperformed to confirm susceptibility to parenteralcefazolin.    Objective: General: Patient is awake, alert, and oriented x 3 and in no acute distress.  Integument: Skin is warm, dry and supple bilateral. Nails are tender, long, thickened and dystrophic with subungual debris, consistent with onychomycosis, 1-5 bilateral. No signs of infection. No open lesions or preulcerative lesions present bilateral. Remaining integument unremarkable.  Vasculature:  Dorsalis Pedis pulse 1/4 bilateral. Posterior Tibial pulse  1/4 bilateral. Capillary fill time <3 sec 1-5 bilateral. Positive hair growth to the level of the digits.Temperature gradient within normal limits. No varicosities present bilateral. No edema present bilateral.   Neurology: The patient has intact sensation measured with a 5.07/10g Semmes Weinstein Monofilament at all pedal sites bilateral . Vibratory sensation intact bilateral with tuning fork. No Babinski sign present bilateral.   Musculoskeletal: No symptomatic pedal deformities noted bilateral. Muscular strength 5/5 in all lower extremity muscular groups bilateral without pain on range of motion. No tenderness with calf compression bilateral.  Assessment and Plan: Problem List Items Addressed This Visit    None    Visit Diagnoses    Pain due to onychomycosis of toenails of both feet    -  Primary   Diabetes mellitus without complication ([place])         -Examined patient. -Discussed and educated patient on diabetic foot care, especially with  regards to the vascular, neurological and musculoskeletal systems.  -Stressed the importance of good glycemic control and the detriment of not  controlling glucose  levels in relation to the foot. -Mechanically debrided all nails 1-5 bilateral using sterile nail nipper and filed with dremel without incident  -Answered all patient questions -Patient to return  in 3 months for at risk foot care -Patient advised to call the office if any problems or questions arise in the meantime.  Landis Martins, DPM.,

## 2017-02-10 ENCOUNTER — Encounter (HOSPITAL_COMMUNITY): Payer: Self-pay | Admitting: Emergency Medicine

## 2017-02-10 ENCOUNTER — Emergency Department (HOSPITAL_COMMUNITY): Payer: Medicare HMO

## 2017-02-10 ENCOUNTER — Emergency Department (HOSPITAL_COMMUNITY)
Admission: EM | Admit: 2017-02-10 | Discharge: 2017-02-10 | Disposition: A | Discharge status: Home / Self Care | Payer: Medicare HMO | Attending: Emergency Medicine | Admitting: Emergency Medicine

## 2017-02-10 DIAGNOSIS — R05 Cough: Secondary | ICD-10-CM | POA: Diagnosis not present

## 2017-02-10 DIAGNOSIS — Z79899 Other long term (current) drug therapy: Secondary | ICD-10-CM | POA: Insufficient documentation

## 2017-02-10 DIAGNOSIS — R6883 Chills (without fever): Secondary | ICD-10-CM | POA: Diagnosis not present

## 2017-02-10 DIAGNOSIS — R079 Chest pain, unspecified: Secondary | ICD-10-CM | POA: Insufficient documentation

## 2017-02-10 DIAGNOSIS — R51 Headache: Secondary | ICD-10-CM | POA: Insufficient documentation

## 2017-02-10 DIAGNOSIS — B349 Viral infection, unspecified: Secondary | ICD-10-CM | POA: Insufficient documentation

## 2017-02-10 DIAGNOSIS — Z853 Personal history of malignant neoplasm of breast: Secondary | ICD-10-CM | POA: Insufficient documentation

## 2017-02-10 DIAGNOSIS — J45909 Unspecified asthma, uncomplicated: Secondary | ICD-10-CM | POA: Diagnosis not present

## 2017-02-10 DIAGNOSIS — M791 Myalgia, unspecified site: Secondary | ICD-10-CM | POA: Diagnosis not present

## 2017-02-10 DIAGNOSIS — Z87891 Personal history of nicotine dependence: Secondary | ICD-10-CM | POA: Diagnosis not present

## 2017-02-10 DIAGNOSIS — Z7984 Long term (current) use of oral hypoglycemic drugs: Secondary | ICD-10-CM | POA: Insufficient documentation

## 2017-02-10 DIAGNOSIS — I10 Essential (primary) hypertension: Secondary | ICD-10-CM | POA: Insufficient documentation

## 2017-02-10 DIAGNOSIS — R0981 Nasal congestion: Secondary | ICD-10-CM | POA: Diagnosis present

## 2017-02-10 LAB — CBG MONITORING, ED: Glucose-Capillary: 70 mg/dL (ref 65–99)

## 2017-02-10 LAB — INFLUENZA PANEL BY PCR (TYPE A & B)
INFLAPCR: NEGATIVE
INFLBPCR: NEGATIVE

## 2017-02-10 MED ORDER — IPRATROPIUM-ALBUTEROL 0.5-2.5 (3) MG/3ML IN SOLN
3.0000 mL | Freq: Once | RESPIRATORY_TRACT | Status: AC
  Administered 2017-02-10: 3 mL via RESPIRATORY_TRACT
  Filled 2017-02-10: qty 3

## 2017-02-10 MED ORDER — BENZONATATE 100 MG PO CAPS: 100.0000 mg | ORAL_CAPSULE | Freq: Three times a day (TID) | ORAL | 0 refills | Status: DC

## 2017-02-10 MED ORDER — METHYLPREDNISOLONE SODIUM SUCC 125 MG IJ SOLR
80.0000 mg | Freq: Once | INTRAMUSCULAR | Status: AC
  Administered 2017-02-10: 80 mg via INTRAMUSCULAR
  Filled 2017-02-10: qty 2

## 2017-02-10 MED ORDER — PREDNISONE 10 MG PO TABS: 20.0000 mg | ORAL_TABLET | Freq: Two times a day (BID) | ORAL | 0 refills | Status: AC

## 2017-02-10 NOTE — ED Notes (Signed)
Pt states she ubderstnds instructions. Home sttable with steady gait.

## 2017-02-10 NOTE — ED Provider Notes (Signed)
[facility] EMERGENCY DEPARTMENT Provider Note   CSN: [id number] Arrival date & time: 02/10/17  1041     History   Chief Complaint Chief Complaint  Patient presents with  . Chills    HPI [name] Cooper is a 66 y.o. female.  HPI   [name] Cooper is a 66 y.o. female, with a history of HTN, GERD, and Asthma, presenting to the ED with nasal and sinus congestion, productive cough with white sputum, chills, body aches, headache, and sinus pressure for the last three days. Chest pain with coughing. Headache is bilateral frontal, pressure, 9/10, nonradiating. Has not tried any therapies for her symptoms.  Had influenza vaccine in September 2018. Has been using her albuterol inhaler about every 6 hours with relief. States she is on metformin because of being prediabetic.  Denies N/V/D, SOB, dizziness, abdominal pain, urinary complaints, or any other complaints.     Past Medical History:  Diagnosis Date  . Arthritis    "down the right side of my body" (09/24/2014)  . Asthma    proair inhaler  . Breast cancer, right breast (H[facility])    S/P mastectomy  . Chronic lower back pain   . GERD (gastroesophageal reflux disease)   . 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 Hyperlipemia   . Hypertension   . Slow heart rate dx'd 09/24/2014    Patient Active Problem List   Diagnosis Date Noted  . Estrogen deficiency 03/30/2016  . Rhinitis, allergic   . Pre-diabetes   . Angioedema 09/24/2014  . HTN (hypertension) 09/24/2014  . HLD (hyperlipidemia) 09/24/2014  . GERD (gastroesophageal reflux disease) 09/24/2014  . OA (osteoarthritis) 09/24/2014  . Swallowing difficulty   . PULMONARY NODULE 03/21/2008  . CHEST PAIN 03/21/2008  . NEOPLASM, MALIGNANT, BREAST, HX OF 03/21/2008    Past Surgical History:  Procedure Laterality Date  . ABDOMINAL HYSTERECTOMY    . APPENDECTOMY    . BREAST BIOPSY Right 1996  . BREAST LUMPECTOMY Right 1996  . MASTECTOMY Right  1996  . TONSILLECTOMY      OB History    No data available       Home Medications    Prior to Admission medications   Medication Sig Start Date End Date Taking? Authorizing Provider  acetaminophen (TYLENOL) 650 MG CR tablet Take 1,300 mg by mouth every 8 (eight) hours as needed for pain. Reported on 09/23/2015    [provider]  albuterol (PROVENTIL HFA;VENTOLIN HFA) 108 (90 Base) MCG/ACT inhaler Inhale 2 puffs into the lungs every 6 (six) hours as needed for wheezing or shortness of breath. 09/21/16   Scot Jun, FNP  benzonatate (TESSALON) 100 MG capsule Take 1 capsule (100 mg total) by mouth every 8 (eight) hours. 02/10/17   Eberardo Demello C, PA-C  budesonide-formoterol (SYMBICORT) 80-4.5 MCG/ACT inhaler Inhale 2 puffs into the lungs 2 (two) times daily. 12/22/16   Scot Jun, FNP  hydrALAZINE (APRESOLINE) 25 MG tablet Take 1 tablet (25 mg total) by mouth 3 (three) times daily. 12/22/16   Scot Jun, FNP  metFORMIN (GLUCOPHAGE) 500 MG tablet Take 1 tablet (500 mg total) by mouth 2 (two) times daily with a meal. 09/21/16   Scot Jun, FNP  pravastatin (PRAVACHOL) 40 MG tablet Take 1 tablet (40 mg total) by mouth daily. 12/22/16   Scot Jun, FNP  predniSONE (DELTASONE) 10 MG tablet Take 2 tablets (20 mg total) by mouth 2 (two) times daily with  a meal. 02/10/17 02/14/17  Haset Oaxaca C, PA-C  traMADol (ULTRAM) 50 MG tablet Take 1-2 tablets (50-100 mg total) by mouth every 12 (twelve) hours as needed for severe pain. 09/21/16   Scot Jun, FNP  triamterene-hydrochlorothiazide (MAXZIDE-25) 37.5-25 MG tablet Take 1 tablet by mouth daily. 12/22/16   Scot Jun, FNP    Family History Family History  Problem Relation Age of Onset  . Colon cancer Neg Hx     Social History Social History  Substance Use Topics  . Smoking status: Former Smoker    Packs/day: 0.25    Years: 43.00    Types: Cigarettes    Quit date: 12/05/2016  . Smokeless  tobacco: Never Used     Comment: 09/24/2014 "I've quit smoking 3 times"  . Alcohol use No     Allergies   Codeine   Review of Systems Review of Systems  Constitutional: Positive for chills.  HENT: Positive for congestion, sinus pain and sinus pressure. Negative for trouble swallowing.   Eyes: Negative for photophobia and visual disturbance.  Respiratory: Positive for cough. Negative for shortness of breath.   Gastrointestinal: Negative for abdominal pain, diarrhea, nausea and vomiting.  Genitourinary: Positive for frequency.  Musculoskeletal:       Chest pain with coughing  Neurological: Positive for headaches.  All other systems reviewed and are negative.    Physical Exam Updated Vital Signs BP 135/83 (BP Location: Right Arm)   Pulse 80   Temp 98.7 F (37.1 C) (Oral)   Resp 20   Ht 5\' 6"  (1.676 m)   Wt 124.7 kg (275 lb)   LMP  (LMP Unknown)   SpO2 98%   BMI 44.39 kg/m   Physical Exam  Constitutional: She appears well-developed and well-nourished. No distress.  HENT:  Head: Normocephalic and atraumatic.  Nose: Right sinus exhibits frontal sinus tenderness. Left sinus exhibits frontal sinus tenderness.  Mouth/Throat: Oropharynx is clear and moist.  Eyes: Conjunctivae are normal.  Neck: Neck supple.  Cardiovascular: Normal rate, regular rhythm, normal heart sounds and intact distal pulses.   Pulmonary/Chest: Effort normal and breath sounds normal. No respiratory distress.  Occasional coughing, but no increased work of breathing. Speaks in full sentences without difficulty.  Abdominal: Soft. There is no tenderness. There is no guarding.  Musculoskeletal: She exhibits no edema.  Lymphadenopathy:    She has no cervical adenopathy.  Neurological: She is alert.  Skin: Skin is warm and dry. Capillary refill takes less than 2 seconds. She is not diaphoretic.  Psychiatric: She has a normal mood and affect. Her behavior is normal.  Nursing note and vitals  reviewed.    ED Treatments / Results  Labs (all labs ordered are listed, but only abnormal results are displayed) Labs Reviewed  INFLUENZA PANEL BY PCR (TYPE A & B)  CBG MONITORING, ED    EKG  EKG Interpretation None       Radiology Dg Chest 2 View  Result Date: 02/10/2017 CLINICAL DATA:  Cough, chills EXAM: CHEST  2 VIEW COMPARISON:  09/24/2014 FINDINGS: Heart is borderline in size. Mild peribronchial thickening. No confluent opacities or effusions. No acute bony abnormality. IMPRESSION: Borderline cardiomegaly.  Bronchitic changes. Electronically Signed   By: Rolm Baptise M.D.   On: 02/10/2017 12:50    Procedures Procedures (including critical care time)  Medications Ordered in ED Medications  methylPREDNISolone sodium succinate (SOLU-MEDROL) 125 mg/2 mL injection 80 mg (80 mg Intramuscular Given 02/10/17 1354)  ipratropium-albuterol (DUONEB) 0.5-2.5 (3) MG/3ML  nebulizer solution 3 mL (3 mLs Nebulization Given 02/10/17 1414)     Initial Impression / Assessment and Plan / ED Course  I have reviewed the triage vital signs and the nursing notes.  Pertinent labs & imaging results that were available during my care of the patient were reviewed by me and considered in my medical decision making (see chart for details).  Clinical Course as of Feb 11 1540  Wed Feb 10, 2017  1445 Patient states she feels much better following the DuoNeb treatment.  [SJ]    Clinical Course User Index [SJ] Jashay Roddy C, PA-C     Patient is nontoxic appearing, afebrile, not tachycardic, not tachypneic, not hypotensive, maintains SPO2 of 98% on room air, and is in no apparent distress. Low suspicion for sepsis. Suspect viral etiology, possibly sinusitis. PCP follow up.  Patient blood sugar noted.  Patient given food here in the ED.  The patient was given instructions for home care as well as return precautions. Patient voices understanding of these instructions, accepts the plan, and is  comfortable with discharge.  Findings and plan of care discussed with Lajean Saver, MD. Dr. Ashok Cordia personally evaluated and examined this patient.   Vitals:   02/10/17 1415 02/10/17 1430 02/10/17 1445 02/10/17 1500  BP: 126/73 125/86 (!) 133/106 113/75  Pulse: (!) 58 70  74  Resp:      Temp:      TempSrc:      SpO2: 100% 99%  99%  Weight:      Height:         Final Clinical Impressions(s) / ED Diagnoses   Final diagnoses:  Viral syndrome    New Prescriptions New Prescriptions   BENZONATATE (TESSALON) 100 MG CAPSULE    Take 1 capsule (100 mg total) by mouth every 8 (eight) hours.   PREDNISONE (DELTASONE) 10 MG TABLET    Take 2 tablets (20 mg total) by mouth 2 (two) times daily with a meal.     Lorayne Bender, PA-C 02/10/17 1541    Lajean Saver, MD 02/10/17 1656

## 2017-02-10 NOTE — Discharge Instructions (Addendum)
Your symptoms are consistent with a viral illness. Viruses do not require antibiotics. Treatment is symptomatic care and it is important to note that these symptoms may last for 7-14 days.   Hand washing: Wash your hands throughout the day, but especially before and after touching the face, using the restroom, sneezing, coughing, or touching surfaces that have been coughed or sneezed upon. Hydration: Symptoms will be intensified and complicated by dehydration. Dehydration can also extend the duration of symptoms. Drink plenty of fluids and get plenty of rest. You should be drinking at least half a liter of water an hour to stay hydrated. Electrolyte drinks are also encouraged. You should be drinking enough fluids to make your urine light yellow, almost clear. If this is not the case, you are not drinking enough water. Please note that some of the treatments indicated below will not be effective if you are not adequately hydrated. Pain or fever: Ibuprofen, Naproxen, or Tylenol for pain or fever.  Cough: Use the Tessalon for cough.  Congestion: Plain Mucinex may help relieve congestion. Saline sinus rinses and saline nasal sprays may also help relieve congestion. If you do not have heart problems or an allergy to such medications, you may also try phenylephrine or Sudafed. Sore throat: Warm liquids or Chloraseptic spray may help soothe a sore throat. Gargle twice a day with a salt water solution made from a half teaspoon of salt in a cup of warm water.  Follow up: Follow up with a primary care provider, as needed, for any future management of this issue.

## 2017-02-10 NOTE — ED Triage Notes (Addendum)
Chills and aching and not feeling well since Sunday has had a bad cough

## 2017-02-10 NOTE — ED Notes (Signed)
Pt ambulates to room then taken to xray via wc.

## 2017-04-06 ENCOUNTER — Encounter: Payer: Self-pay | Admitting: Sports Medicine

## 2017-04-06 ENCOUNTER — Ambulatory Visit (INDEPENDENT_AMBULATORY_CARE_PROVIDER_SITE_OTHER): Payer: Medicare HMO | Admitting: Sports Medicine

## 2017-04-06 DIAGNOSIS — E119 Type 2 diabetes mellitus without complications: Secondary | ICD-10-CM | POA: Diagnosis not present

## 2017-04-06 DIAGNOSIS — M79675 Pain in left toe(s): Secondary | ICD-10-CM

## 2017-04-06 DIAGNOSIS — M79674 Pain in right toe(s): Secondary | ICD-10-CM

## 2017-04-06 DIAGNOSIS — B351 Tinea unguium: Secondary | ICD-10-CM

## 2017-04-06 NOTE — Progress Notes (Signed)
Subjective: [name] Cooper is a 66 y.o. female patient with history of diabetes who returns to office today complaining of long, painful nails  while ambulating in shoes; unable to trim. Patient states that the glucose reading this morning was not recorded, last A1c 6.4. Patient denies any new changes in medication or new problems.   Patient Active Problem List   Diagnosis Date Noted  . Estrogen deficiency 03/30/2016  . Rhinitis, allergic   . Pre-diabetes   . Angioedema 09/24/2014  . HTN (hypertension) 09/24/2014  . HLD (hyperlipidemia) 09/24/2014  . GERD (gastroesophageal reflux disease) 09/24/2014  . OA (osteoarthritis) 09/24/2014  . Swallowing difficulty   . PULMONARY NODULE 03/21/2008  . CHEST PAIN 03/21/2008  . NEOPLASM, MALIGNANT, BREAST, HX OF 03/21/2008   Current Outpatient Medications on File Prior to Visit  Medication Sig Dispense Refill  . acetaminophen (TYLENOL) 650 MG CR tablet Take 1,300 mg by mouth every 8 (eight) hours as needed for pain. Reported on 09/23/2015    . albuterol (PROVENTIL HFA;VENTOLIN HFA) 108 (90 Base) MCG/ACT inhaler Inhale 2 puffs into the lungs every 6 (six) hours as needed for wheezing or shortness of breath. 1 Inhaler 3  . benzonatate (TESSALON) 100 MG capsule Take 1 capsule (100 mg total) by mouth every 8 (eight) hours. 21 capsule 0  . budesonide-formoterol (SYMBICORT) 80-4.5 MCG/ACT inhaler Inhale 2 puffs into the lungs 2 (two) times daily. 1 Inhaler 3  . hydrALAZINE (APRESOLINE) 25 MG tablet Take 1 tablet (25 mg total) by mouth 3 (three) times daily. 270 tablet 2  . metFORMIN (GLUCOPHAGE) 500 MG tablet Take 1 tablet (500 mg total) by mouth 2 (two) times daily with a meal. 180 tablet 3  . pravastatin (PRAVACHOL) 40 MG tablet Take 1 tablet (40 mg total) by mouth daily. 90 tablet 3  . traMADol (ULTRAM) 50 MG tablet Take 1-2 tablets (50-100 mg total) by mouth every 12 (twelve) hours as needed for severe pain. 30 tablet 0  .  triamterene-hydrochlorothiazide (MAXZIDE-25) 37.5-25 MG tablet Take 1 tablet by mouth daily. 90 tablet 1   No current facility-administered medications on file prior to visit.    Allergies  Allergen Reactions  . Codeine     REACTION: MAKES HER SLEEPY    Recent Results (from the past 2160 hour(s))  Influenza panel by PCR (type A & B)     Status: None   Collection Time: 02/10/17  1:01 PM  Result Value Ref Range   Influenza A By PCR NEGATIVE NEGATIVE   Influenza B By PCR NEGATIVE NEGATIVE    Comment: (NOTE) The Xpert Xpress Flu assay is intended as an aid in the diagnosis of  influenza and should not be used as a sole basis for treatment.  This  assay is FDA approved for nasopharyngeal swab specimens only. Nasal  washings and aspirates are unacceptable for Xpert Xpress Flu testing.   POC CBG, ED     Status: None   Collection Time: 02/10/17  1:17 PM  Result Value Ref Range   Glucose-Capillary 70 65 - 99 mg/dL    Objective: General: Patient is awake, alert, and oriented x 3 and in no acute distress.  Integument: Skin is warm, dry and supple bilateral. Nails are tender, long, thickened and dystrophic with subungual debris, consistent with onychomycosis, 1-5 bilateral. No signs of infection. No open lesions or preulcerative lesions present bilateral. Remaining integument unremarkable.  Vasculature:  Dorsalis Pedis pulse 1/4 bilateral. Posterior Tibial pulse  1/4 bilateral. Capillary fill time <3  sec 1-5 bilateral. Positive hair growth to the level of the digits.Temperature gradient within normal limits. No varicosities present bilateral. No edema present bilateral.   Neurology: The patient has intact sensation measured with a 5.07/10g Semmes Weinstein Monofilament at all pedal sites bilateral . Vibratory sensation intact bilateral with tuning fork. No Babinski sign present bilateral.   Musculoskeletal: No symptomatic pedal deformities noted bilateral. Muscular strength 5/5 in all lower  extremity muscular groups bilateral without pain on range of motion. No tenderness with calf compression bilateral.  Assessment and Plan: Problem List Items Addressed This Visit    None    Visit Diagnoses    Pain due to onychomycosis of toenails of both feet    -  Primary   Diabetes mellitus without complication ([place])         -Examined patient. -Discussed and educated patient on diabetic foot care, especially with  regards to the vascular, neurological and musculoskeletal systems.  -Stressed the importance of good glycemic control and the detriment of not controlling glucose levels in relation to the foot. -Mechanically debrided all nails 1-5 bilateral using sterile nail nipper and filed with dremel without incident  -Answered all patient questions -Patient to return  in 3 months for at risk foot care -Patient advised to call the office if any problems or questions arise in the meantime.  Landis Martins, DPM.,

## 2017-05-26 ENCOUNTER — Other Ambulatory Visit: Payer: Self-pay | Admitting: Family Medicine

## 2017-05-26 MED ORDER — HYDRALAZINE HCL 25 MG PO TABS: 25.0000 mg | ORAL_TABLET | Freq: Three times a day (TID) | ORAL | 2 refills | Status: DC

## 2017-05-26 NOTE — Progress Notes (Signed)
Refilled hydralazine

## 2017-06-21 ENCOUNTER — Encounter: Payer: Self-pay | Admitting: Family Medicine

## 2017-06-21 ENCOUNTER — Ambulatory Visit (INDEPENDENT_AMBULATORY_CARE_PROVIDER_SITE_OTHER): Payer: Medicare Other | Admitting: Family Medicine

## 2017-06-21 VITALS — BP 126/78 | HR 84 | Temp 98.0°F | Ht 66.0 in | Wt 274.0 lb

## 2017-06-21 DIAGNOSIS — N3000 Acute cystitis without hematuria: Secondary | ICD-10-CM

## 2017-06-21 DIAGNOSIS — E785 Hyperlipidemia, unspecified: Secondary | ICD-10-CM | POA: Diagnosis not present

## 2017-06-21 DIAGNOSIS — I10 Essential (primary) hypertension: Secondary | ICD-10-CM | POA: Diagnosis not present

## 2017-06-21 DIAGNOSIS — R7303 Prediabetes: Secondary | ICD-10-CM

## 2017-06-21 DIAGNOSIS — N393 Stress incontinence (female) (male): Secondary | ICD-10-CM

## 2017-06-21 DIAGNOSIS — Z1231 Encounter for screening mammogram for malignant neoplasm of breast: Secondary | ICD-10-CM | POA: Diagnosis not present

## 2017-06-21 DIAGNOSIS — H5789 Other specified disorders of eye and adnexa: Secondary | ICD-10-CM | POA: Diagnosis not present

## 2017-06-21 DIAGNOSIS — Z1239 Encounter for other screening for malignant neoplasm of breast: Secondary | ICD-10-CM

## 2017-06-21 LAB — POCT URINALYSIS DIP (DEVICE)
Bilirubin Urine: NEGATIVE
Glucose, UA: NEGATIVE mg/dL
Hgb urine dipstick: NEGATIVE
KETONES UR: NEGATIVE mg/dL
Nitrite: POSITIVE — AB
PH: 5.5 (ref 5.0–8.0)
PROTEIN: NEGATIVE mg/dL
Specific Gravity, Urine: 1.025 (ref 1.005–1.030)
UROBILINOGEN UA: 0.2 mg/dL (ref 0.0–1.0)

## 2017-06-21 LAB — POCT GLYCOSYLATED HEMOGLOBIN (HGB A1C): Hemoglobin A1C: 6.4

## 2017-06-21 MED ORDER — CEPHALEXIN 500 MG PO CAPS: 500.0000 mg | ORAL_CAPSULE | Freq: Three times a day (TID) | ORAL | 0 refills | Status: DC

## 2017-06-21 MED ORDER — ERYTHROMYCIN 5 MG/GM OP OINT: 1.0000 "application " | TOPICAL_OINTMENT | Freq: Every day | OPHTHALMIC | 0 refills | Status: DC

## 2017-06-21 MED ORDER — CRANBERRY 500 MG PO CAPS: 500.0000 mg | ORAL_CAPSULE | Freq: Two times a day (BID) | ORAL | 2 refills | Status: DC

## 2017-06-21 MED ORDER — ALBUTEROL SULFATE HFA 108 (90 BASE) MCG/ACT IN AERS: 2.0000 | INHALATION_SPRAY | Freq: Four times a day (QID) | RESPIRATORY_TRACT | 3 refills | Status: DC | PRN

## 2017-06-21 MED ORDER — TRAMADOL HCL 50 MG PO TABS: 50.0000 mg | ORAL_TABLET | Freq: Two times a day (BID) | ORAL | 0 refills | Status: DC | PRN

## 2017-06-21 NOTE — Progress Notes (Signed)
poct

## 2017-06-21 NOTE — Progress Notes (Signed)
Patient ID: [name] Cooper, female    DOB: Jul 14, 1950, 67 y.o.   MRN: [id number]  PCP: Scot Jun, FNP  Chief Complaint  Patient presents with  . Follow-up    6 month on routine    Subjective:  HPI-[name] Cooper is a 67 y.o. female presents evaluation chronic conditions: prediabetes, hypertension, and new complaint of urinary incontinence. [name] Cooper reports compliance with medication regimen. No routine home monitoring of blood pressure. 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 She suffers from prediabetes, last A1C 6.1. Currently takes metformin 500 mg twice daily. Denies symptoms of polydipsia or neuropathy. Recent eye exam less than one year which corrective lens were recommended. Denies chest pain, dizziness, cough, or new weakness. Reports chronic shortness of breath with exertion while at work. Her jobs consists of climbing  several flights of stairs which induces dyspnea.   She complains today of chronic incontinence which has worsened recently. Incontinence is provoked by laughing, yawning, and or coughing. She has to wear incontinence products to prevent bladder leakage. She reports a history of bladder sling placement around 1986 s/p hysterectomy due to problems with bladder control. She has not had any recent follow-up with urology. Over the course of last year she has had a few urinary tract infections and reports that her urine always has a strong odor and is dark colored. She drinks plenty water. Social History   Socioeconomic History  . Marital status: Widowed    Spouse name: Not on file  . Number of children: Not on file  . Years of education: Not on file  . Highest education level: Not on file  Social Needs  . Financial resource strain: Not on file  . Food insecurity - worry: Not on file  . Food insecurity - inability: Not on file  . Transportation needs - medical: Not on file  . Transportation  needs - non-medical: Not on file  Occupational History  . Not on file  Tobacco Use  . Smoking status: Former Smoker    Packs/day: 0.25    Years: 43.00    Pack years: 10.75    Types: Cigarettes    Last attempt to quit: 12/05/2016    Years since quitting: 0.5  . Smokeless tobacco: Never Used  . Tobacco comment: 09/24/2014 "I've quit smoking 3 times"  Substance and Sexual Activity  . Alcohol use: No    Alcohol/week: 0.0 oz  . Drug use: No    Comment: 09/24/2014 "last crack was in 2013"  . Sexual activity: Not on file  Other Topics Concern  . Not on file  Social History Narrative  . Not on file    Family History  Problem Relation Age of Onset  . Other Mother        no health concerns  . Other Father        no health concerns  . Colon cancer Neg Hx    Review of Systems  Constitutional: Negative.   Respiratory: Negative.   Cardiovascular: Negative.   Genitourinary: Positive for flank pain, frequency and urgency.  Skin: Negative.   Neurological: Negative.   Psychiatric/Behavioral: Negative.     Patient Active Problem List   Diagnosis Date Noted  . Estrogen deficiency 03/30/2016  . Rhinitis, allergic   . Pre-diabetes   . Angioedema 09/24/2014  . HTN (hypertension) 09/24/2014  . HLD (hyperlipidemia) 09/24/2014  . GERD (gastroesophageal reflux disease) 09/24/2014  . OA (osteoarthritis)  09/24/2014  . Swallowing difficulty   . PULMONARY NODULE 03/21/2008  . CHEST PAIN 03/21/2008  . NEOPLASM, MALIGNANT, BREAST, HX OF 03/21/2008    Allergies  Allergen Reactions  . Codeine     REACTION: MAKES HER SLEEPY    Prior to Admission medications   Medication Sig Start Date End Date Taking? Authorizing Provider  acetaminophen (TYLENOL) 650 MG CR tablet Take 1,300 mg by mouth every 8 (eight) hours as needed for pain. Reported on 09/23/2015   Yes [provider]  albuterol (PROVENTIL HFA;VENTOLIN HFA) 108 (90 Base) MCG/ACT inhaler Inhale 2 puffs into the lungs every 6 (six)  hours as needed for wheezing or shortness of breath. 09/21/16  Yes Scot Jun, FNP  benzonatate (TESSALON) 100 MG capsule Take 1 capsule (100 mg total) by mouth every 8 (eight) hours. 02/10/17  Yes Joy, Shawn C, PA-C  budesonide-formoterol (SYMBICORT) 80-4.5 MCG/ACT inhaler Inhale 2 puffs into the lungs 2 (two) times daily. 12/22/16  Yes Scot Jun, FNP  hydrALAZINE (APRESOLINE) 25 MG tablet Take 1 tablet (25 mg total) by mouth 3 (three) times daily. 05/26/17  Yes Scot Jun, FNP  metFORMIN (GLUCOPHAGE) 500 MG tablet Take 1 tablet (500 mg total) by mouth 2 (two) times daily with a meal. 09/21/16  Yes Scot Jun, FNP  pravastatin (PRAVACHOL) 40 MG tablet Take 1 tablet (40 mg total) by mouth daily. 12/22/16  Yes Scot Jun, FNP  triamterene-hydrochlorothiazide (MAXZIDE-25) 37.5-25 MG tablet Take 1 tablet by mouth daily. 12/22/16  Yes Scot Jun, FNP  traMADol (ULTRAM) 50 MG tablet Take 1-2 tablets (50-100 mg total) by mouth every 12 (twelve) hours as needed for severe pain. Patient not taking: Reported on 06/21/2017 09/21/16   Scot Jun, FNP    Past Medical, Surgical Family and Social History reviewed and updated.    Objective:   Today's Vitals   06/21/17 1444  BP: 126/78  Pulse: 84  Temp: 98 F (36.7 C)  TempSrc: Oral  SpO2: 98%  Weight: 274 lb (124.3 kg)  Height: 5\' 6"  (1.676 m)    Wt Readings from Last 3 Encounters:  06/21/17 274 lb (124.3 kg)  02/10/17 275 lb (124.7 kg)  12/22/16 280 lb (127 kg)   Physical Exam Constitutional: Patient appears well-developed and well-nourished. No distress. HENT: Normocephalic, atraumatic, External right and left ear normal. Oropharynx is clear and moist.  Eyes: Conjunctivae and EOM are normal. PERRLA, no scleral icterus. Neck: Normal ROM. Neck supple. No JVD. No tracheal deviation. No thyromegaly. CVS: RRR, S1/S2 +, no murmurs, no gallops, no carotid bruit.  Pulmonary: Effort and breath sounds normal,  Positve CVA tenderness, rebound or guarding.  Musculoskeletal: Normal range of motion. No edema and no tenderness.  Lymphadenopathy: No lymphadenopathy noted, cervical, inguinal or axillary Neuro: Alert. Normal reflexes, muscle tone coordination. No cranial nerve deficit. Skin: Skin is warm and dry. No rash noted. Not diaphoretic. No erythema. No pallor. Psychiatric: Normal mood and affect. Behavior, judgment, thought content normal.  Assessment & Plan:  1. Essential hypertension, controlled. We have discussed target BP range and blood pressure goal. I have advised patient to check BP regularly and to call us back or report to clinic if the numbers are consistently higher than 140/90. We discussed the importance of compliance with medical therapy and DASH diet recommended, consequences of uncontrolled hypertension discussed. Continue current BP medications  2. Acute cystitis without hematuria, start Keflex 500 mg TID x 10 days for infections. Urine Culture pending.  Will also trial cranberry PO for urinary health.  3. Pre-diabetes, - Hb A1C-6.4. Continue metformin 500 mg twice daily. Will obtain Microalbumin/Creatinine Ratio, Urine  4. Stress incontinence of urine- hx of bladder sling placement while in [place] in 1986. -Ambulatory referral to Urology  5. Screening for breast cancer- MM Digital Screening; Future, due after 09/25/2017.  6. Eye irritation, will trial erythromycin ointment, one band daily at bedtime until medication is completed.    Meds ordered this encounter  Medications  . DISCONTD: cephALEXin (KEFLEX) 500 MG capsule    Sig: Take 1 capsule (500 mg total) by mouth 3 (three) times daily for 10 days.    Dispense:  30 capsule    Refill:  0    Order Specific Question:   Supervising Provider    Answer:   Tresa Garter [id number]  . traMADol (ULTRAM) 50 MG tablet    Sig: Take 1-2 tablets (50-100 mg total) by mouth every 12 (twelve) hours as needed for severe pain.     Dispense:  30 tablet    Refill:  0    Order Specific Question:   Supervising Provider    Answer:   Tresa Garter [id number]  . albuterol (PROVENTIL HFA;VENTOLIN HFA) 108 (90 Base) MCG/ACT inhaler    Sig: Inhale 2 puffs into the lungs every 6 (six) hours as needed for wheezing or shortness of breath.    Dispense:  1 Inhaler    Refill:  3    Order Specific Question:   Supervising Provider    Answer:   Tresa Garter [id number]  . DISCONTD: cephALEXin (KEFLEX) 500 MG capsule    Sig: Take 1 capsule (500 mg total) by mouth 3 (three) times daily for 10 days.    Dispense:  30 capsule    Refill:  0    Order Specific Question:   Supervising Provider    Answer:   Tresa Garter [id number]  . Cranberry 500 MG CAPS    Sig: Take 1 capsule (500 mg total) by mouth 2 (two) times daily.    Dispense:  90 each    Refill:  2    Order Specific Question:   Supervising Provider    Answer:   Tresa Garter [id number]  . erythromycin ophthalmic ointment    Sig: Place 1 application into the right eye at bedtime.    Dispense:  3.5 g    Refill:  0    Order Specific Question:   Supervising Provider    Answer:   Tresa Garter [[id number]]    Orders Placed This Encounter  Procedures  . Urine Culture  . MM Digital Screening  . Microalbumin/Creatinine Ratio, Urine  . Ambulatory referral to Urology  . POCT glycosylated hemoglobin (Hb A1C)  . POCT urinalysis dip (device)    RTC: 4 week for fasting labs and 6 months for chronic conditions.   Carroll Sage. Kenton Kingfisher, MSN, FNP-C [facility]  [address] [place], [place] [address] [phone]

## 2017-06-22 LAB — MICROALBUMIN / CREATININE URINE RATIO
CREATININE, UR: 147.1 mg/dL
MICROALB/CREAT RATIO: 6.9 mg/g{creat} (ref 0.0–30.0)
MICROALBUM., U, RANDOM: 10.2 ug/mL

## 2017-06-24 LAB — URINE CULTURE

## 2017-07-06 ENCOUNTER — Ambulatory Visit (INDEPENDENT_AMBULATORY_CARE_PROVIDER_SITE_OTHER): Payer: Medicare Other | Admitting: Sports Medicine

## 2017-07-06 ENCOUNTER — Encounter: Payer: Self-pay | Admitting: Sports Medicine

## 2017-07-06 DIAGNOSIS — M79675 Pain in left toe(s): Secondary | ICD-10-CM | POA: Diagnosis not present

## 2017-07-06 DIAGNOSIS — B351 Tinea unguium: Secondary | ICD-10-CM

## 2017-07-06 DIAGNOSIS — M79674 Pain in right toe(s): Secondary | ICD-10-CM

## 2017-07-06 DIAGNOSIS — E119 Type 2 diabetes mellitus without complications: Secondary | ICD-10-CM

## 2017-07-06 NOTE — Progress Notes (Signed)
Subjective: [name] Cooper is a 67 y.o. female patient with history of diabetes who returns to office today complaining of long, painful nails  while ambulating in shoes; unable to trim. Patient states that the glucose reading this morning was not recorded, last A1c 6.7. Patient denies any new changes in medication or new problems.   Patient Active Problem List   Diagnosis Date Noted  . Estrogen deficiency 03/30/2016  . Rhinitis, allergic   . Pre-diabetes   . Angioedema 09/24/2014  . HTN (hypertension) 09/24/2014  . HLD (hyperlipidemia) 09/24/2014  . GERD (gastroesophageal reflux disease) 09/24/2014  . OA (osteoarthritis) 09/24/2014  . Swallowing difficulty   . PULMONARY NODULE 03/21/2008  . CHEST PAIN 03/21/2008  . NEOPLASM, MALIGNANT, BREAST, HX OF 03/21/2008   Current Outpatient Medications on File Prior to Visit  Medication Sig Dispense Refill  . acetaminophen (TYLENOL) 650 MG CR tablet Take 1,300 mg by mouth every 8 (eight) hours as needed for pain. Reported on 09/23/2015    . albuterol (PROVENTIL HFA;VENTOLIN HFA) 108 (90 Base) MCG/ACT inhaler Inhale 2 puffs into the lungs every 6 (six) hours as needed for wheezing or shortness of breath. 1 Inhaler 3  . benzonatate (TESSALON) 100 MG capsule Take 1 capsule (100 mg total) by mouth every 8 (eight) hours. 21 capsule 0  . budesonide-formoterol (SYMBICORT) 80-4.5 MCG/ACT inhaler Inhale 2 puffs into the lungs 2 (two) times daily. 1 Inhaler 3  . Cranberry 500 MG CAPS Take 1 capsule (500 mg total) by mouth 2 (two) times daily. 90 each 2  . erythromycin ophthalmic ointment Place 1 application into the right eye at bedtime. 3.5 g 0  . hydrALAZINE (APRESOLINE) 25 MG tablet Take 1 tablet (25 mg total) by mouth 3 (three) times daily. 270 tablet 2  . metFORMIN (GLUCOPHAGE) 500 MG tablet Take 1 tablet (500 mg total) by mouth 2 (two) times daily with a meal. 180 tablet 3  . pravastatin (PRAVACHOL) 40 MG tablet Take 1 tablet (40 mg total) by mouth  daily. 90 tablet 3  . traMADol (ULTRAM) 50 MG tablet Take 1-2 tablets (50-100 mg total) by mouth every 12 (twelve) hours as needed for severe pain. 30 tablet 0  . triamterene-hydrochlorothiazide (MAXZIDE-25) 37.5-25 MG tablet Take 1 tablet by mouth daily. 90 tablet 1   No current facility-administered medications on file prior to visit.    Allergies  Allergen Reactions  . Codeine     REACTION: MAKES HER SLEEPY    Recent Results (from the past 2160 hour(s))  POCT urinalysis dip (device)     Status: Abnormal   Collection Time: 06/21/17  2:55 PM  Result Value Ref Range   Glucose, UA NEGATIVE NEGATIVE mg/dL   Bilirubin Urine NEGATIVE NEGATIVE   Ketones, ur NEGATIVE NEGATIVE mg/dL   Specific Gravity, Urine 1.025 1.005 - 1.030   Hgb urine dipstick NEGATIVE NEGATIVE   pH 5.5 5.0 - 8.0   Protein, ur NEGATIVE NEGATIVE mg/dL   Urobilinogen, UA 0.2 0.0 - 1.0 mg/dL   Nitrite POSITIVE (A) NEGATIVE   Leukocytes, UA SMALL (A) NEGATIVE    Comment: Biochemical Testing Only. Please order routine urinalysis from main lab if confirmatory testing is needed.  Urine Culture     Status: Abnormal   Collection Time: 06/21/17  3:07 PM  Result Value Ref Range   Urine Culture, Routine Final report (A)    Organism ID, Bacteria Escherichia coli (A)     Comment: Greater than 100,000 colony forming units per mL  Antimicrobial Susceptibility Comment     Comment:       ** S = Susceptible; I = Intermediate; R = Resistant **                    P = Positive; N = Negative             MICS are expressed in micrograms per mL    Antibiotic                 RSLT#1    RSLT#2    RSLT#3    RSLT#4 Amoxicillin/Clavulanic Acid    I Ampicillin                     R Cefepime                       S Ceftriaxone                    S Cefuroxime                     I Ciprofloxacin                  S Ertapenem                      S Gentamicin                     S Imipenem                       S Levofloxacin                    S Meropenem                      S Nitrofurantoin                 I Piperacillin/Tazobactam        S Tetracycline                   S Tobramycin                     S Trimethoprim/Sulfa             S   POCT glycosylated hemoglobin (Hb A1C)     Status: Abnormal   Collection Time: 06/21/17  3:08 PM  Result Value Ref Range   Hemoglobin A1C 6.4   Microalbumin/Creatinine Ratio, Urine     Status: None   Collection Time: 06/21/17  3:21 PM  Result Value Ref Range   Creatinine, Urine 147.1 Not Estab. mg/dL   Microalbumin, Urine 10.2 Not Estab. ug/mL   Microalb/Creat Ratio 6.9 0.0 - 30.0 mg/g creat    Comment:                      Normal:                0.0 -  30.0                      Albuminuria:          31.0 - 300.0                      Clinical albuminuria:       >  300.0     Objective: General: Patient is awake, alert, and oriented x 3 and in no acute distress.  Integument: Skin is warm, dry and supple bilateral. Nails are tender, long, thickened and dystrophic with subungual debris, consistent with onychomycosis, 1-5 bilateral. No signs of infection. No open lesions or preulcerative lesions present bilateral. Remaining integument unremarkable.  Vasculature:  Dorsalis Pedis pulse 1/4 bilateral. Posterior Tibial pulse  1/4 bilateral. Capillary fill time <3 sec 1-5 bilateral. Positive hair growth to the level of the digits.Temperature gradient within normal limits. No varicosities present bilateral. No edema present bilateral.   Neurology: The patient has intact sensation measured with a 5.07/10g Semmes Weinstein Monofilament at all pedal sites bilateral . Vibratory sensation intact bilateral with tuning fork. No Babinski sign present bilateral.   Musculoskeletal: No symptomatic pedal deformities noted bilateral. Muscular strength 5/5 in all lower extremity muscular groups bilateral without pain on range of motion. No tenderness with calf compression bilateral.  Assessment and  Plan: Problem List Items Addressed This Visit    None    Visit Diagnoses    Pain due to onychomycosis of toenails of both feet    -  Primary   Diabetes mellitus without complication ([place])         -Examined patient. -Discussed and educated patient on diabetic foot care, especially with  regards to the vascular, neurological and musculoskeletal systems.  -Stressed the importance of good glycemic control and the detriment of not controlling glucose levels in relation to the foot. -Mechanically debrided all nails 1-5 bilateral using sterile nail nipper and filed with dremel without incident  -Patient to return  in 3 months for at risk foot care -Patient advised to call the office if any problems or questions arise in the meantime.  Landis Martins, DPM.,

## 2017-07-10 ENCOUNTER — Emergency Department (HOSPITAL_COMMUNITY): Payer: Medicare Other

## 2017-07-10 ENCOUNTER — Encounter (HOSPITAL_COMMUNITY): Payer: Self-pay | Admitting: Emergency Medicine

## 2017-07-10 ENCOUNTER — Other Ambulatory Visit: Payer: Self-pay

## 2017-07-10 ENCOUNTER — Emergency Department (HOSPITAL_COMMUNITY)
Admission: EM | Admit: 2017-07-10 | Discharge: 2017-07-10 | Disposition: A | Discharge status: Home / Self Care | Payer: Medicare Other | Attending: Emergency Medicine | Admitting: Emergency Medicine

## 2017-07-10 DIAGNOSIS — Z87891 Personal history of nicotine dependence: Secondary | ICD-10-CM | POA: Insufficient documentation

## 2017-07-10 DIAGNOSIS — M791 Myalgia, unspecified site: Secondary | ICD-10-CM | POA: Diagnosis not present

## 2017-07-10 DIAGNOSIS — I10 Essential (primary) hypertension: Secondary | ICD-10-CM | POA: Insufficient documentation

## 2017-07-10 DIAGNOSIS — R509 Fever, unspecified: Secondary | ICD-10-CM | POA: Insufficient documentation

## 2017-07-10 DIAGNOSIS — Z853 Personal history of malignant neoplasm of breast: Secondary | ICD-10-CM | POA: Insufficient documentation

## 2017-07-10 DIAGNOSIS — R05 Cough: Secondary | ICD-10-CM | POA: Insufficient documentation

## 2017-07-10 DIAGNOSIS — R6889 Other general symptoms and signs: Secondary | ICD-10-CM

## 2017-07-10 DIAGNOSIS — Z7984 Long term (current) use of oral hypoglycemic drugs: Secondary | ICD-10-CM | POA: Insufficient documentation

## 2017-07-10 DIAGNOSIS — J45909 Unspecified asthma, uncomplicated: Secondary | ICD-10-CM | POA: Diagnosis not present

## 2017-07-10 DIAGNOSIS — R0981 Nasal congestion: Secondary | ICD-10-CM | POA: Diagnosis not present

## 2017-07-10 DIAGNOSIS — Z79899 Other long term (current) drug therapy: Secondary | ICD-10-CM | POA: Diagnosis not present

## 2017-07-10 LAB — INFLUENZA PANEL BY PCR (TYPE A & B)
Influenza A By PCR: POSITIVE — AB
Influenza B By PCR: NEGATIVE

## 2017-07-10 MED ORDER — FLUTICASONE PROPIONATE 50 MCG/ACT NA SUSP: 1.0000 | Freq: Every day | NASAL | 0 refills | Status: DC

## 2017-07-10 MED ORDER — PROMETHAZINE-DM 6.25-15 MG/5ML PO SYRP: 5.0000 mL | ORAL_SOLUTION | Freq: Four times a day (QID) | ORAL | 0 refills | Status: DC | PRN

## 2017-07-10 MED ORDER — ONDANSETRON HCL 4 MG PO TABS: 4.0000 mg | ORAL_TABLET | Freq: Three times a day (TID) | ORAL | 0 refills | Status: DC | PRN

## 2017-07-10 MED ORDER — OSELTAMIVIR PHOSPHATE 75 MG PO CAPS: 75.0000 mg | ORAL_CAPSULE | Freq: Two times a day (BID) | ORAL | 0 refills | Status: DC

## 2017-07-10 MED ORDER — ACETAMINOPHEN 325 MG PO TABS
650.0000 mg | ORAL_TABLET | Freq: Once | ORAL | Status: AC | PRN
  Administered 2017-07-10: 650 mg via ORAL
  Filled 2017-07-10: qty 2

## 2017-07-10 NOTE — Discharge Instructions (Signed)
You likely have the flu or another viral illness.  This is usually treated symptomatically. Use Tylenol or ibuprofen as needed for fevers or body aches. Use Flonase daily for nasal congestion and cough. Use cough syrup as needed.  You were tested for the flu. If the results are positive, I will call you. If they are not positive, you will not receive a phone call. Fill the tamiflu prescription only if the results are positive.  Make sure you stay well-hydrated with water. Wash your hands frequently to prevent spread of infection. Follow-up with your primary care doctor in 5 days if your symptoms are not improving. Return to the emergency room if you develop chest pain, difficulty breathing, or any new or worsening symptoms.

## 2017-07-10 NOTE — ED Triage Notes (Signed)
Pt reports generalized body aches since Thursday with a cough. White mucous. Subjective fevers. T 99 at triage.

## 2017-07-10 NOTE — ED Notes (Signed)
Called pt for room, no answer

## 2017-07-10 NOTE — ED Provider Notes (Signed)
[facility] EMERGENCY DEPARTMENT Provider Note   CSN: [id number] Arrival date & time: 07/10/17  1511     History   Chief Complaint Chief Complaint  Patient presents with  . Generalized Body Aches  . Cough    HPI Delisa Finck is a 67 y.o. female presenting for evaluation of fever, cough, and body aches.  Patient states that all of a sudden on Thursday, she developed fever, cough, body aches.  Her fever is intermittent, improves with BC cold and flu medication. Hasn't tried anything else.  Her cough is productive, worse at night.  She reports generalized body aches.  She has associated nasal congestion at night, none during the day.  Her brother is sick with similar symptoms.  She denies other sick contacts.  She denies ear pain, eye pain, sore throat, chest pain, shortness of breath, nausea, vomiting, abdominal pain, urinary symptoms, abnormal bowel movements.  She has a history of COPD for which she uses an inhaler as needed.  Additionally, patient with history of diabetes and h/o breast cancer, not on immunosuppresants.  She smokes cigarettes, denies alcohol or drug use.  HPI  Past Medical History:  Diagnosis Date  . Arthritis    "down the right side of my body" (09/24/2014)  . Asthma    proair inhaler  . Breast cancer, right breast (HCC)    S/P mastectomy  . Chronic lower back pain   . GERD (gastroesophageal reflux disease)   . 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 Hyperlipemia   . Hypertension   . Slow heart rate dx'd 09/24/2014    Patient Active Problem List   Diagnosis Date Noted  . Estrogen deficiency 03/30/2016  . Rhinitis, allergic   . Pre-diabetes   . Angioedema 09/24/2014  . HTN (hypertension) 09/24/2014  . HLD (hyperlipidemia) 09/24/2014  . GERD (gastroesophageal reflux disease) 09/24/2014  . OA (osteoarthritis) 09/24/2014  . Swallowing difficulty   . PULMONARY NODULE 03/21/2008  . CHEST PAIN 03/21/2008  . NEOPLASM,  MALIGNANT, BREAST, HX OF 03/21/2008    Past Surgical History:  Procedure Laterality Date  . ABDOMINAL HYSTERECTOMY    . APPENDECTOMY    . BREAST BIOPSY Right 1996  . BREAST LUMPECTOMY Right 1996  . MASTECTOMY Right 1996  . TONSILLECTOMY       OB History   None      Home Medications    Prior to Admission medications   Medication Sig Start Date End Date Taking? Authorizing Provider  acetaminophen (TYLENOL) 650 MG CR tablet Take 1,300 mg by mouth every 8 (eight) hours as needed for pain. Reported on 09/23/2015    [provider]  albuterol (PROVENTIL HFA;VENTOLIN HFA) 108 (90 Base) MCG/ACT inhaler Inhale 2 puffs into the lungs every 6 (six) hours as needed for wheezing or shortness of breath. 06/21/17   Scot Jun, FNP  benzonatate (TESSALON) 100 MG capsule Take 1 capsule (100 mg total) by mouth every 8 (eight) hours. 02/10/17   Joy, Shawn C, PA-C  budesonide-formoterol (SYMBICORT) 80-4.5 MCG/ACT inhaler Inhale 2 puffs into the lungs 2 (two) times daily. 12/22/16   Scot Jun, FNP  Cranberry 500 MG CAPS Take 1 capsule (500 mg total) by mouth 2 (two) times daily. 06/21/17   Scot Jun, FNP  erythromycin ophthalmic ointment Place 1 application into the right eye at bedtime. 06/21/17   Scot Jun, FNP  fluticasone (FLONASE) 50 MCG/ACT nasal spray Place 1 spray into both  nostrils daily. 07/10/17   Dantrell Schertzer, PA-C  hydrALAZINE (APRESOLINE) 25 MG tablet Take 1 tablet (25 mg total) by mouth 3 (three) times daily. 05/26/17   Scot Jun, FNP  metFORMIN (GLUCOPHAGE) 500 MG tablet Take 1 tablet (500 mg total) by mouth 2 (two) times daily with a meal. 09/21/16   Scot Jun, FNP  ondansetron (ZOFRAN) 4 MG tablet Take 1 tablet (4 mg total) by mouth every 8 (eight) hours as needed for nausea or vomiting. 07/10/17   Mahmud Keithly, PA-C  oseltamivir (TAMIFLU) 75 MG capsule Take 1 capsule (75 mg total) by mouth every 12 (twelve) hours. 07/10/17    Jaiyana Canale, PA-C  pravastatin (PRAVACHOL) 40 MG tablet Take 1 tablet (40 mg total) by mouth daily. 12/22/16   Scot Jun, FNP  promethazine-dextromethorphan (PROMETHAZINE-DM) 6.25-15 MG/5ML syrup Take 5 mLs by mouth 4 (four) times daily as needed. 07/10/17   Taggert Bozzi, PA-C  traMADol (ULTRAM) 50 MG tablet Take 1-2 tablets (50-100 mg total) by mouth every 12 (twelve) hours as needed for severe pain. 06/21/17   Scot Jun, FNP  triamterene-hydrochlorothiazide (MAXZIDE-25) 37.5-25 MG tablet Take 1 tablet by mouth daily. 12/22/16   Scot Jun, FNP    Family History Family History  Problem Relation Age of Onset  . Other Mother        no health concerns  . Other Father        no health concerns  . Colon cancer Neg Hx     Social History Social History   Tobacco Use  . Smoking status: Former Smoker    Packs/day: 0.25    Years: 43.00    Pack years: 10.75    Types: Cigarettes    Last attempt to quit: 12/05/2016    Years since quitting: 0.5  . Smokeless tobacco: Never Used  . Tobacco comment: 09/24/2014 "I've quit smoking 3 times"  Substance Use Topics  . Alcohol use: No    Alcohol/week: 0.0 oz  . Drug use: No    Types: "Crack" cocaine    Comment: 09/24/2014 "last crack was in 2013"     Allergies   Codeine   Review of Systems Review of Systems  Constitutional: Positive for chills and fever.  HENT: Positive for congestion. Negative for sore throat.   Respiratory: Positive for cough. Negative for chest tightness and shortness of breath.   Cardiovascular: Negative for chest pain.  Gastrointestinal: Negative for abdominal pain, nausea and vomiting.  Genitourinary: Negative for dysuria, frequency and hematuria.  Musculoskeletal: Positive for myalgias.  Skin: Negative for rash.  Allergic/Immunologic: Negative for immunocompromised state.  Neurological: Negative for headaches.  Hematological: Does not bruise/bleed easily.  Psychiatric/Behavioral:  Negative for confusion.     Physical Exam Updated Vital Signs BP 124/75   Pulse 66   Temp (!) 100.8 F (38.2 C) (Oral)   Resp (!) 22   Ht 5\' 6"  (1.676 m)   Wt 120.2 kg (265 lb)   LMP  (LMP Unknown)   SpO2 95%   BMI 42.77 kg/m   Physical Exam  Constitutional: She is oriented to person, place, and time. She appears well-developed and well-nourished. No distress.  Resting comfortably in the bed in no apparent distress.  HENT:  Head: Normocephalic and atraumatic.  Right Ear: Tympanic membrane, external ear and ear canal normal.  Left Ear: Tympanic membrane, external ear and ear canal normal.  Nose: Mucosal edema present. Right sinus exhibits no maxillary sinus tenderness and no frontal sinus  tenderness. Left sinus exhibits no maxillary sinus tenderness and no frontal sinus tenderness.  Mouth/Throat: Uvula is midline, oropharynx is clear and moist and mucous membranes are normal. No tonsillar exudate.  Nasal mucosal edema.  Tenderness palpation of the maxillary sinuses.  OP clear without tonsillar swelling or exudate.  TMs nonerythematous and not bulging bilaterally.  Eyes: Pupils are equal, round, and reactive to light. Conjunctivae and EOM are normal.  Neck: Normal range of motion.  Cardiovascular: Normal rate, regular rhythm and intact distal pulses.  Pulmonary/Chest: Effort normal and breath sounds normal. She has no decreased breath sounds. She has no wheezes. She has no rhonchi. She has no rales.  Pt speaking in full sentences without difficulty.  Clear lung sounds in all fields  Abdominal: Soft. She exhibits no distension. There is no tenderness.  Musculoskeletal: Normal range of motion.  Lymphadenopathy:    She has cervical adenopathy.  Neurological: She is alert and oriented to person, place, and time.  Skin: Skin is warm.  Psychiatric: She has a normal mood and affect.  Nursing note and vitals reviewed.    ED Treatments / Results  Labs (all labs ordered are listed,  but only abnormal results are displayed) Labs Reviewed  INFLUENZA PANEL BY PCR (TYPE A & B) - Abnormal; Notable for the following components:      Result Value   Influenza A By PCR POSITIVE (*)    All other components within normal limits    EKG None  Radiology Dg Chest 2 View  Result Date: 07/10/2017 CLINICAL DATA:  Cough. EXAM: CHEST - 2 VIEW COMPARISON:  February 10, 2017 FINDINGS: The heart size and mediastinal contours are within normal limits. Both lungs are clear. The visualized skeletal structures are unremarkable. IMPRESSION: No active cardiopulmonary disease. Electronically Signed   By: Dorise Bullion III M.D   On: 07/10/2017 15:53    Procedures Procedures (including critical care time)  Medications Ordered in ED Medications  acetaminophen (TYLENOL) tablet 650 mg (650 mg Oral Given 07/10/17 1528)     Initial Impression / Assessment and Plan / ED Course  I have reviewed the triage vital signs and the nursing notes.  Pertinent labs & imaging results that were available during my care of the patient were reviewed by me and considered in my medical decision making (see chart for details).     Patient presenting with 3-day history of flu like symptoms.  Physical exam shows patient who appears in no distress.  Patient is febrile up to 102.7.  Tylenol given, and fever improved as expected.  I am concerned that the patient may have the flu. As she has a history of COPD will test for the flu.  Chest x-ray reassuring, reviewed and interpreted by me and shows no sign of pneumonia or other acute infiltrate.  Discussed findings with patient.  Discussed treatment with Tamiflu and symptomatic treatment with cough syrup and Flonase.  Zofran given for potential Tamiflu side effects.  Instructed to stay well-hydrated.  Follow-up with primary care next week for further evaluation as needed.  Will call patient if flu results are positive.  Case discussed with attending, Dr. Tamera Punt agrees to  plan.  At this time, patient appears safe for discharge.  Return precautions given.  Patient states she understands and agrees to plan.  820PM: Patient's flu results are positive. Called pt so she knows to fill and rx for tamiflu and take it as prescribed. Pt states she understands.    Final Clinical Impressions(s) /  ED Diagnoses   Final diagnoses:  Flu-like symptoms    ED Discharge Orders        Ordered    oseltamivir (TAMIFLU) 75 MG capsule  Every 12 hours     07/10/17 1815    ondansetron (ZOFRAN) 4 MG tablet  Every 8 hours PRN     07/10/17 1815    promethazine-dextromethorphan (PROMETHAZINE-DM) 6.25-15 MG/5ML syrup  4 times daily PRN     07/10/17 1815    fluticasone (FLONASE) 50 MCG/ACT nasal spray  Daily     07/10/17 1816       Franchot Heidelberg, PA-C 07/10/17 2025    Malvin Johns, MD 07/10/17 2315

## 2017-07-10 NOTE — ED Notes (Signed)
Declined W/C at D/C and was escorted to lobby by RN. 

## 2017-07-19 ENCOUNTER — Other Ambulatory Visit: Payer: Medicare Other

## 2017-07-19 DIAGNOSIS — I10 Essential (primary) hypertension: Secondary | ICD-10-CM

## 2017-07-19 DIAGNOSIS — N183 Chronic kidney disease, stage 3 unspecified: Secondary | ICD-10-CM

## 2017-07-19 DIAGNOSIS — E785 Hyperlipidemia, unspecified: Secondary | ICD-10-CM

## 2017-07-20 LAB — LIPID PANEL
CHOL/HDL RATIO: 2.6 ratio (ref 0.0–4.4)
Cholesterol, Total: 177 mg/dL (ref 100–199)
HDL: 68 mg/dL (ref >39)
LDL CALC: 94 mg/dL (ref 0–99)
TRIGLYCERIDES: 75 mg/dL (ref 0–149)
VLDL Cholesterol Cal: 15 mg/dL (ref 5–40)

## 2017-07-20 LAB — COMPREHENSIVE METABOLIC PANEL
A/G RATIO: 1.4 (ref 1.2–2.2)
ALT: 10 IU/L (ref 0–32)
AST: 20 IU/L (ref 0–40)
Albumin: 4.5 g/dL (ref 3.6–4.8)
Alkaline Phosphatase: 80 IU/L (ref 39–117)
BILIRUBIN TOTAL: 0.4 mg/dL (ref 0.0–1.2)
BUN/Creatinine Ratio: 17 (ref 12–28)
BUN: 24 mg/dL (ref 8–27)
CALCIUM: 9.9 mg/dL (ref 8.7–10.3)
CO2: 25 mmol/L (ref 20–29)
Chloride: 103 mmol/L (ref 96–106)
Creatinine, Ser: 1.41 mg/dL — ABNORMAL HIGH (ref 0.57–1.00)
GFR calc Af Amer: 44 mL/min/{1.73_m2} — ABNORMAL LOW (ref >59)
GFR, EST NON AFRICAN AMERICAN: 39 mL/min/{1.73_m2} — AB (ref >59)
GLOBULIN, TOTAL: 3.2 g/dL (ref 1.5–4.5)
Glucose: 89 mg/dL (ref 65–99)
POTASSIUM: 4.4 mmol/L (ref 3.5–5.2)
SODIUM: 144 mmol/L (ref 134–144)
Total Protein: 7.7 g/dL (ref 6.0–8.5)

## 2017-07-20 LAB — THYROID PANEL WITH TSH
Free Thyroxine Index: 3.5 (ref 1.2–4.9)
T3 Uptake Ratio: 38 % (ref 24–39)
T4 TOTAL: 9.1 ug/dL (ref 4.5–12.0)
TSH: 1.31 u[IU]/mL (ref 0.450–4.500)

## 2017-07-22 NOTE — Progress Notes (Signed)
Contact patient to advise renal functions studies remain abnormal. I am referring her to [facility].

## 2017-09-11 ENCOUNTER — Other Ambulatory Visit: Payer: Self-pay | Admitting: Family Medicine

## 2017-10-12 ENCOUNTER — Encounter: Payer: Self-pay | Admitting: Sports Medicine

## 2017-10-12 ENCOUNTER — Ambulatory Visit (INDEPENDENT_AMBULATORY_CARE_PROVIDER_SITE_OTHER): Payer: Medicare Other | Admitting: Sports Medicine

## 2017-10-12 DIAGNOSIS — L602 Onychogryphosis: Secondary | ICD-10-CM | POA: Diagnosis not present

## 2017-10-12 DIAGNOSIS — E119 Type 2 diabetes mellitus without complications: Secondary | ICD-10-CM | POA: Diagnosis not present

## 2017-10-12 DIAGNOSIS — B351 Tinea unguium: Secondary | ICD-10-CM

## 2017-10-12 DIAGNOSIS — M79674 Pain in right toe(s): Secondary | ICD-10-CM | POA: Diagnosis not present

## 2017-10-12 DIAGNOSIS — M79675 Pain in left toe(s): Secondary | ICD-10-CM

## 2017-10-12 NOTE — Progress Notes (Addendum)
Subjective: [name] Cooper is a 67 y.o. female patient with history of diabetes who returns to office today complaining of long, painful nails  while ambulating in shoes; unable to trim. Patient states that her right 1st toenail is painful and wants to discuss possible removal. Patient reports her glucose reading this morning was not recorded, last A1c 6.4. Patient denies any new changes in medication or new problems.   Patient Active Problem List   Diagnosis Date Noted  . Estrogen deficiency 03/30/2016  . Rhinitis, allergic   . Pre-diabetes   . Angioedema 09/24/2014  . HTN (hypertension) 09/24/2014  . HLD (hyperlipidemia) 09/24/2014  . GERD (gastroesophageal reflux disease) 09/24/2014  . OA (osteoarthritis) 09/24/2014  . Swallowing difficulty   . PULMONARY NODULE 03/21/2008  . CHEST PAIN 03/21/2008  . NEOPLASM, MALIGNANT, BREAST, HX OF 03/21/2008   Current Outpatient Medications on File Prior to Visit  Medication Sig Dispense Refill  . acetaminophen (TYLENOL) 650 MG CR tablet Take 1,300 mg by mouth every 8 (eight) hours as needed for pain. Reported on 09/23/2015    . albuterol (PROVENTIL HFA;VENTOLIN HFA) 108 (90 Base) MCG/ACT inhaler Inhale 2 puffs into the lungs every 6 (six) hours as needed for wheezing or shortness of breath. 1 Inhaler 3  . benzonatate (TESSALON) 100 MG capsule Take 1 capsule (100 mg total) by mouth every 8 (eight) hours. 21 capsule 0  . budesonide-formoterol (SYMBICORT) 80-4.5 MCG/ACT inhaler Inhale 2 puffs into the lungs 2 (two) times daily. 1 Inhaler 3  . Cranberry 500 MG CAPS Take 1 capsule (500 mg total) by mouth 2 (two) times daily. 90 each 2  . erythromycin ophthalmic ointment Place 1 application into the right eye at bedtime. 3.5 g 0  . fluticasone (FLONASE) 50 MCG/ACT nasal spray Place 1 spray into both nostrils daily. 16 g 0  . hydrALAZINE (APRESOLINE) 25 MG tablet Take 1 tablet (25 mg total) by mouth 3 (three) times daily. 270 tablet 2  . metFORMIN  (GLUCOPHAGE) 500 MG tablet TAKE 1 TABLET BY MOUTH TWO  TIMES DAILY 180 tablet 3  . ondansetron (ZOFRAN) 4 MG tablet Take 1 tablet (4 mg total) by mouth every 8 (eight) hours as needed for nausea or vomiting. 12 tablet 0  . oseltamivir (TAMIFLU) 75 MG capsule Take 1 capsule (75 mg total) by mouth every 12 (twelve) hours. 10 capsule 0  . pravastatin (PRAVACHOL) 40 MG tablet TAKE 1 TABLET BY MOUTH  EVERY DAY 90 tablet 3  . promethazine-dextromethorphan (PROMETHAZINE-DM) 6.25-15 MG/5ML syrup Take 5 mLs by mouth 4 (four) times daily as needed. 118 mL 0  . traMADol (ULTRAM) 50 MG tablet Take 1-2 tablets (50-100 mg total) by mouth every 12 (twelve) hours as needed for severe pain. 30 tablet 0  . triamterene-hydrochlorothiazide (MAXZIDE-25) 37.5-25 MG tablet TAKE 1 TABLET BY MOUTH  EVERY DAY 90 tablet 1   No current facility-administered medications on file prior to visit.    Allergies  Allergen Reactions  . Codeine     REACTION: MAKES HER SLEEPY    Recent Results (from the past 2160 hour(s))  Comprehensive metabolic panel     Status: Abnormal   Collection Time: 07/19/17 11:00 AM  Result Value Ref Range   Glucose 89 65 - 99 mg/dL   BUN 24 8 - 27 mg/dL   Creatinine, Ser 1.41 (H) 0.57 - 1.00 mg/dL   GFR calc non Af Amer 39 (L) >59 mL/min/1.73   GFR calc Af Amer 44 (L) >59 mL/min/1.73  BUN/Creatinine Ratio 17 12 - 28   Sodium 144 134 - 144 mmol/L   Potassium 4.4 3.5 - 5.2 mmol/L   Chloride 103 96 - 106 mmol/L   CO2 25 20 - 29 mmol/L   Calcium 9.9 8.7 - 10.3 mg/dL   Total Protein 7.7 6.0 - 8.5 g/dL   Albumin 4.5 3.6 - 4.8 g/dL   Globulin, Total 3.2 1.5 - 4.5 g/dL   Albumin/Globulin Ratio 1.4 1.2 - 2.2   Bilirubin Total 0.4 0.0 - 1.2 mg/dL   Alkaline Phosphatase 80 39 - 117 IU/L   AST 20 0 - 40 IU/L   ALT 10 0 - 32 IU/L  Thyroid Panel With TSH     Status: None   Collection Time: 07/19/17 11:00 AM  Result Value Ref Range   TSH 1.310 0.450 - 4.500 uIU/mL   T4, Total 9.1 4.5 - 12.0 ug/dL    T3 Uptake Ratio 38 24 - 39 %   Free Thyroxine Index 3.5 1.2 - 4.9  Lipid panel     Status: None   Collection Time: 07/19/17 11:00 AM  Result Value Ref Range   Cholesterol, Total 177 100 - 199 mg/dL   Triglycerides 75 0 - 149 mg/dL   HDL 68 >39 mg/dL   VLDL Cholesterol Cal 15 5 - 40 mg/dL   LDL Calculated 94 0 - 99 mg/dL   Chol/HDL Ratio 2.6 0.0 - 4.4 ratio    Comment:                                   T. Chol/HDL Ratio                                             Men  Women                               1/2 Avg.Risk  3.4    3.3                                   Avg.Risk  5.0    4.4                                2X Avg.Risk  9.6    7.1                                3X Avg.Risk 23.4   11.0     Objective: General: Patient is awake, alert, and oriented x 3 and in no acute distress.  Integument: Skin is warm, dry and supple bilateral. Nails are tender, long, thickened and dystrophic with subungual debris, consistent with onychomycosis, 1-5 bilateral with R>L hallux nail is severely thick and rams horn appearance. No signs of infection. No open lesions or preulcerative lesions present bilateral. Remaining integument unremarkable.  Vasculature:  Dorsalis Pedis pulse 1/4 bilateral. Posterior Tibial pulse  1/4 bilateral. Capillary fill time <3 sec 1-5 bilateral. Positive hair growth to the level of the digits.Temperature gradient within normal limits. No varicosities present bilateral. No edema present bilateral.  Neurology: The patient has intact sensation measured with a 5.07/10g Semmes Weinstein Monofilament at all pedal sites bilateral . Vibratory sensation intact bilateral with tuning fork. No Babinski sign present bilateral.   Musculoskeletal: No symptomatic pedal deformities noted bilateral. Muscular strength 5/5 in all lower extremity muscular groups bilateral without pain on range of motion. No tenderness with calf compression bilateral.  Assessment and Plan: Problem List Items  Addressed This Visit    None    Visit Diagnoses    Pain due to onychomycosis of toenails of both feet    -  Primary   Diabetes mellitus without complication ([place])       Onychogryposis         -Examined patient. -Discussed and educated patient on diabetic foot care, especially with  regards to the vascular, neurological and musculoskeletal systems.  -Stressed the importance of good glycemic control and the detriment of not controlling glucose levels in relation to the foot. -Mechanically debrided all nails 1-5 on left 2-5 on right using sterile nail nipper and filed with dremel without incident  -Discussed treatment alternatives and plan of care for right great toenail; Explained permanent nail avulsion and post procedure course to patient. - After a verbal consent, injected 3 ml of a 50:50 mixture of 2% plain  lidocaine and 0.5% plain marcaine in a normal hallux block fashion. Next, a  betadine prep was performed. Anesthesia was tested and found to be appropriate.  The offending right hallux nail  was then incised from the hyponychium to the epinychium. The offending nail was removed and cleared from the field. The area was curretted for any remaining nail or spicules. Phenol application performed and the area was then flushed with alcohol and dressed with antibiotic cream and a dry sterile dressing. -Patient was instructed to leave the dressing intact for today and begin soaking  in a weak solution of betadine and water tomorrow. Patient was instructed to  soak for 15 minutes each day and apply neosporin and a gauze or bandaid dressing each day. -Patient was instructed to monitor the toe for signs of infection and return to office if toe becomes red, hot or swollen. -Patient to return in 2 weeks for nail check on right 1st toe -Work note: resume normal work on Thursday -Patient advised to call the office if any problems or questions arise in the meantime.  Landis Martins, DPM.,

## 2017-10-12 NOTE — Patient Instructions (Signed)

## 2017-10-26 ENCOUNTER — Encounter: Payer: Self-pay | Admitting: Sports Medicine

## 2017-10-26 ENCOUNTER — Ambulatory Visit (INDEPENDENT_AMBULATORY_CARE_PROVIDER_SITE_OTHER): Payer: Medicare Other | Admitting: Sports Medicine

## 2017-10-26 DIAGNOSIS — Z9889 Other specified postprocedural states: Secondary | ICD-10-CM

## 2017-10-26 DIAGNOSIS — E119 Type 2 diabetes mellitus without complications: Secondary | ICD-10-CM

## 2017-10-26 DIAGNOSIS — M79674 Pain in right toe(s): Secondary | ICD-10-CM

## 2017-10-26 NOTE — Progress Notes (Signed)
Subjective: [name] Cooper is a 67 y.o. Diabetic female patient returns to office today for follow up evaluation after having Right permanent total nail avulsion performed on 10-12-17. Patient has been soaking using epsom salt and applying topical antibiotic covered with bandaid daily. Patient denies fever/chills/nausea/vomitting/any other related constitutional symptoms at this time.  Patient Active Problem List   Diagnosis Date Noted  . Estrogen deficiency 03/30/2016  . Rhinitis, allergic   . Pre-diabetes   . Angioedema 09/24/2014  . HTN (hypertension) 09/24/2014  . HLD (hyperlipidemia) 09/24/2014  . GERD (gastroesophageal reflux disease) 09/24/2014  . OA (osteoarthritis) 09/24/2014  . Swallowing difficulty   . PULMONARY NODULE 03/21/2008  . CHEST PAIN 03/21/2008  . NEOPLASM, MALIGNANT, BREAST, HX OF 03/21/2008    Current Outpatient Medications on File Prior to Visit  Medication Sig Dispense Refill  . acetaminophen (TYLENOL) 650 MG CR tablet Take 1,300 mg by mouth every 8 (eight) hours as needed for pain. Reported on 09/23/2015    . albuterol (PROVENTIL HFA;VENTOLIN HFA) 108 (90 Base) MCG/ACT inhaler Inhale 2 puffs into the lungs every 6 (six) hours as needed for wheezing or shortness of breath. 1 Inhaler 3  . benzonatate (TESSALON) 100 MG capsule Take 1 capsule (100 mg total) by mouth every 8 (eight) hours. 21 capsule 0  . budesonide-formoterol (SYMBICORT) 80-4.5 MCG/ACT inhaler Inhale 2 puffs into the lungs 2 (two) times daily. 1 Inhaler 3  . Cranberry 500 MG CAPS Take 1 capsule (500 mg total) by mouth 2 (two) times daily. 90 each 2  . erythromycin ophthalmic ointment Place 1 application into the right eye at bedtime. 3.5 g 0  . fluticasone (FLONASE) 50 MCG/ACT nasal spray Place 1 spray into both nostrils daily. 16 g 0  . hydrALAZINE (APRESOLINE) 25 MG tablet Take 1 tablet (25 mg total) by mouth 3 (three) times daily. 270 tablet 2  . metFORMIN (GLUCOPHAGE) 500 MG tablet TAKE 1  TABLET BY MOUTH TWO  TIMES DAILY 180 tablet 3  . ondansetron (ZOFRAN) 4 MG tablet Take 1 tablet (4 mg total) by mouth every 8 (eight) hours as needed for nausea or vomiting. 12 tablet 0  . oseltamivir (TAMIFLU) 75 MG capsule Take 1 capsule (75 mg total) by mouth every 12 (twelve) hours. 10 capsule 0  . pravastatin (PRAVACHOL) 40 MG tablet TAKE 1 TABLET BY MOUTH  EVERY DAY 90 tablet 3  . promethazine-dextromethorphan (PROMETHAZINE-DM) 6.25-15 MG/5ML syrup Take 5 mLs by mouth 4 (four) times daily as needed. 118 mL 0  . traMADol (ULTRAM) 50 MG tablet Take 1-2 tablets (50-100 mg total) by mouth every 12 (twelve) hours as needed for severe pain. 30 tablet 0  . triamterene-hydrochlorothiazide (MAXZIDE-25) 37.5-25 MG tablet TAKE 1 TABLET BY MOUTH  EVERY DAY 90 tablet 1   No current facility-administered medications on file prior to visit.     Allergies  Allergen Reactions  . Codeine     REACTION: MAKES HER SLEEPY    Objective:  General: Well developed, nourished, in no acute distress, alert and oriented x3   Dermatology: Skin is warm, dry and supple bilateral. Right hallux nail bed appears to be clean, dry, with mild granular tissue and no surrounding eschar/scab. (-) Erythema. (-) Edema. (-) serosanguous drainage (++)maceration present. The remaining nails appear unremarkable at this time free from infection but thickened and discolored consistent with onychomycosis. There are no other lesions or other signs of infection  present.  Neurovascular status: Intact. No lower extremity swelling; No pain with calf compression  bilateral.  Musculoskeletal: Decreased tenderness to palpation of the Right hallux. Muscular strength within normal limits bilateral.   Assesement and Plan: Problem List Items Addressed This Visit    None    Visit Diagnoses    S/P nail surgery    -  Primary   Toe pain, right       Diabetes mellitus without complication ([place])          -Examined patient  -Cleansed right  hallux nail fold and gently scrubbed with peroxide and q-tip/curetted away eschar at site and applied antibiotic cream covered with bandaid.  -Discussed plan of care with patient. -Patient to now begin soaking in a weak solution of Epsom salt and warm water once a day. Patient was instructed to soak for 15-20 minutes each day until the toe appears normal and there is no drainage, redness, tenderness, or swelling at the procedure site, and apply neosporin and a gauze or bandaid dressing each day as needed. May leave open to air at night. -Educated patient on long term care after nail surgery. -Patient was instructed to monitor the toe for reoccurrence and signs of infection; Patient advised to return to office or go to ER if toe becomes red, hot or swollen. -Patient is to return as needed or sooner if problems arise.  Landis Martins, DPM

## 2017-11-03 ENCOUNTER — Ambulatory Visit
Admission: RE | Admit: 2017-11-03 | Discharge: 2017-11-03 | Disposition: A | Discharge status: Home / Self Care | Payer: Medicare Other | Source: Ambulatory Visit | Attending: Family Medicine | Admitting: Family Medicine

## 2017-11-03 DIAGNOSIS — N6320 Unspecified lump in the left breast, unspecified quadrant: Secondary | ICD-10-CM

## 2017-12-22 ENCOUNTER — Encounter: Payer: Self-pay | Admitting: Family Medicine

## 2017-12-22 ENCOUNTER — Ambulatory Visit (INDEPENDENT_AMBULATORY_CARE_PROVIDER_SITE_OTHER): Payer: Medicare Other | Admitting: Family Medicine

## 2017-12-22 VITALS — BP 116/66 | HR 78 | Temp 98.0°F | Ht 66.0 in | Wt 254.0 lb

## 2017-12-22 DIAGNOSIS — R7303 Prediabetes: Secondary | ICD-10-CM | POA: Diagnosis not present

## 2017-12-22 DIAGNOSIS — Z09 Encounter for follow-up examination after completed treatment for conditions other than malignant neoplasm: Secondary | ICD-10-CM

## 2017-12-22 DIAGNOSIS — N390 Urinary tract infection, site not specified: Secondary | ICD-10-CM

## 2017-12-22 DIAGNOSIS — I10 Essential (primary) hypertension: Secondary | ICD-10-CM

## 2017-12-22 DIAGNOSIS — Z23 Encounter for immunization: Secondary | ICD-10-CM | POA: Diagnosis not present

## 2017-12-22 DIAGNOSIS — R319 Hematuria, unspecified: Secondary | ICD-10-CM

## 2017-12-22 DIAGNOSIS — R8290 Unspecified abnormal findings in urine: Secondary | ICD-10-CM

## 2017-12-22 LAB — POCT URINALYSIS DIP (MANUAL ENTRY)
Bilirubin, UA: NEGATIVE
Blood, UA: NEGATIVE
Glucose, UA: NEGATIVE mg/dL
Ketones, POC UA: NEGATIVE mg/dL
Nitrite, UA: POSITIVE — AB
Protein Ur, POC: NEGATIVE mg/dL
Spec Grav, UA: 1.02 (ref 1.010–1.025)
Urobilinogen, UA: 0.2 E.U./dL
pH, UA: 6 (ref 5.0–8.0)

## 2017-12-22 LAB — POCT GLYCOSYLATED HEMOGLOBIN (HGB A1C): Hemoglobin A1C: 6.1 % — AB (ref 4.0–5.6)

## 2017-12-22 MED ORDER — SULFAMETHOXAZOLE-TRIMETHOPRIM 800-160 MG PO TABS: 1.0000 | ORAL_TABLET | Freq: Two times a day (BID) | ORAL | 0 refills | Status: DC

## 2017-12-22 NOTE — Progress Notes (Signed)
Follow Up  Subjective:    Patient ID: [name] Cooper, female    DOB: 1950-07-23, 67 y.o.   MRN: [id number]  Chief Complaint  Patient presents with  . Follow-up    Chronic condition    HPI  [name] Cooper is a 67 year old female with a past medical history of Hypertension, Hyperlipidemia, Hepatis, GERD, Chronic Low Back Pain, Right Breast Cancer, Asthma, and Arthritis. She is here today for follow up.   Current Status: Since her last office visit, she is doing well with no complaints. She denies fevers, chills, fatigue, recent infections, weight loss, and night sweats. She has not had any headaches, visual changes, dizziness, and falls. No chest pain, heart palpitations, cough and shortness of breath reported. No reports of GI problems such as nausea, vomiting, diarrhea, and constipation. She has no reports of blood in stools, dysuria and hematuria. No depression or anxiety, and denies suicidal ideations, homicidal ideations, or auditory hallucinations. She denies pain today.      Review of Systems  Constitutional: Negative.   HENT: Negative.   Eyes: Negative.   Respiratory: Negative.   Cardiovascular: Negative.   Gastrointestinal: Positive for abdominal distention (Obese).  Endocrine: Negative.   Genitourinary: Negative.   Musculoskeletal: Positive for arthralgias (Generalized).  Skin: Negative.   Allergic/Immunologic: Negative.   Neurological: Negative.   Hematological: Negative.   Psychiatric/Behavioral: Negative.    Objective:   Physical Exam  Constitutional: She is oriented to person, place, and time. She appears well-developed and well-nourished.  HENT:  Head: Normocephalic and atraumatic.  Right Ear: External ear normal.  Left Ear: External ear normal.  Nose: Nose normal.  Mouth/Throat: Oropharynx is clear and moist.  Eyes: Pupils are equal, round, and reactive to light. Conjunctivae and EOM are normal.  Neck: Normal range of motion. Neck supple.  Cardiovascular:  Normal rate, regular rhythm, normal heart sounds and intact distal pulses.  Pulmonary/Chest: Effort normal and breath sounds normal.  Abdominal: Soft. Bowel sounds are normal.  Musculoskeletal: Normal range of motion.  Neurological: She is alert and oriented to person, place, and time.  Skin: Skin is warm and dry. Capillary refill takes less than 2 seconds.  Psychiatric: She has a normal mood and affect. Her behavior is normal. Judgment and thought content normal.  Nursing note and vitals reviewed.  Assessment & Plan:   1. Essential hypertension Blood pressure is stable at 116/66 today. Continue Hydralazine as prescribe.   2. Need for immunization against influenza - Flu Vaccine QUAD 36+ mos IM  3. Pre-diabetes Hgb A1c is decreased at 6.1 today, from 6.4 on 06/21/2017. She will continue to decrease foods/beverages high in sugars and carbs and follow Heart Healthy or DASH diet. Increase physical activity to at least 30 minutes cardio exercise daily.   - POCT glycosylated hemoglobin (Hb A1C) - POCT urinalysis dipstick  4. Urinary tract infection with hematuria, site unspecified - sulfamethoxazole-trimethoprim (BACTRIM DS,SEPTRA DS) 800-160 MG tablet; Take 1 tablet by mouth 2 (two) times daily.  Dispense: 14 tablet; Refill: 0  5. Abnormal urinalysis - Urine Culture  6. Follow up She will follow up in 6 months.   Meds ordered this encounter  Medications  . sulfamethoxazole-trimethoprim (BACTRIM DS,SEPTRA DS) 800-160 MG tablet    Sig: Take 1 tablet by mouth 2 (two) times daily.    Dispense:  14 tablet    Refill:  0    Kathe Becton,  MSN, FNP-C Patient [place]  [place], [place] [address] [phone]

## 2017-12-25 LAB — URINE CULTURE

## 2017-12-26 ENCOUNTER — Other Ambulatory Visit: Payer: Self-pay | Admitting: Family Medicine

## 2017-12-26 DIAGNOSIS — A498 Other bacterial infections of unspecified site: Secondary | ICD-10-CM

## 2017-12-26 NOTE — Progress Notes (Signed)
Rx for Cipro sent to pharmacy today.

## 2017-12-27 ENCOUNTER — Telehealth: Payer: Self-pay

## 2017-12-27 NOTE — Telephone Encounter (Signed)
Patient notified

## 2017-12-27 NOTE — Telephone Encounter (Signed)
-----   Message from Azzie Glatter, [place] sent at 12/26/2017  8:43 PM EDT ----- Regarding: "Urine Culture Results" [name] Cooper,   Urine culture identified bacteria. Please inform patient that we have sent new Rx for Cipro to pharmacy today. This antibiotic is specific to eliminate this bacteria. She is to discontinue Septra. She is to take new medication as directed. She is to complete all medication until complete.   Thank you.

## 2018-01-03 MED ORDER — CIPROFLOXACIN HCL 500 MG PO TABS: 500.0000 mg | ORAL_TABLET | Freq: Two times a day (BID) | ORAL | 0 refills | Status: AC

## 2018-01-11 ENCOUNTER — Ambulatory Visit (INDEPENDENT_AMBULATORY_CARE_PROVIDER_SITE_OTHER): Payer: Medicaid Other | Admitting: Sports Medicine

## 2018-01-11 ENCOUNTER — Encounter: Payer: Self-pay | Admitting: Sports Medicine

## 2018-01-11 DIAGNOSIS — M79675 Pain in left toe(s): Secondary | ICD-10-CM | POA: Diagnosis not present

## 2018-01-11 DIAGNOSIS — L602 Onychogryphosis: Secondary | ICD-10-CM

## 2018-01-11 DIAGNOSIS — M79674 Pain in right toe(s): Secondary | ICD-10-CM | POA: Diagnosis not present

## 2018-01-11 DIAGNOSIS — L600 Ingrowing nail: Secondary | ICD-10-CM

## 2018-01-11 DIAGNOSIS — B351 Tinea unguium: Secondary | ICD-10-CM

## 2018-01-11 DIAGNOSIS — E119 Type 2 diabetes mellitus without complications: Secondary | ICD-10-CM

## 2018-01-11 NOTE — Progress Notes (Signed)
Subjective: [name] Cooper is a 67 y.o. female patient with history of diabetes who returns to office today complaining of long, painful nails  while ambulating in shoes; unable to trim. Patient states that her left 1st toenail is painful and wants to discuss possible removal to be done today. Patient reports her glucose reading this morning was not recorded, last A1c 6.1. Patient denies any new changes in medication or new problems.   Patient Active Problem List   Diagnosis Date Noted  . Estrogen deficiency 03/30/2016  . Rhinitis, allergic   . Pre-diabetes   . Angioedema 09/24/2014  . HTN (hypertension) 09/24/2014  . HLD (hyperlipidemia) 09/24/2014  . GERD (gastroesophageal reflux disease) 09/24/2014  . OA (osteoarthritis) 09/24/2014  . Swallowing difficulty   . PULMONARY NODULE 03/21/2008  . CHEST PAIN 03/21/2008  . NEOPLASM, MALIGNANT, BREAST, HX OF 03/21/2008   Current Outpatient Medications on File Prior to Visit  Medication Sig Dispense Refill  . acetaminophen (TYLENOL) 650 MG CR tablet Take 1,300 mg by mouth every 8 (eight) hours as needed for pain. Reported on 09/23/2015    . albuterol (PROVENTIL HFA;VENTOLIN HFA) 108 (90 Base) MCG/ACT inhaler Inhale 2 puffs into the lungs every 6 (six) hours as needed for wheezing or shortness of breath. 1 Inhaler 3  . budesonide-formoterol (SYMBICORT) 80-4.5 MCG/ACT inhaler Inhale 2 puffs into the lungs 2 (two) times daily. 1 Inhaler 3  . erythromycin ophthalmic ointment Place 1 application into the right eye at bedtime. 3.5 g 0  . fluticasone (FLONASE) 50 MCG/ACT nasal spray Place 1 spray into both nostrils daily. 16 g 0  . hydrALAZINE (APRESOLINE) 25 MG tablet Take 1 tablet (25 mg total) by mouth 3 (three) times daily. 270 tablet 2  . metFORMIN (GLUCOPHAGE) 500 MG tablet TAKE 1 TABLET BY MOUTH TWO  TIMES DAILY 180 tablet 3  . Multiple Vitamins-Minerals (SENTRY ADULT) TABS Take by mouth.    . ondansetron (ZOFRAN) 4 MG tablet Take 1 tablet (4  mg total) by mouth every 8 (eight) hours as needed for nausea or vomiting. 12 tablet 0  . pravastatin (PRAVACHOL) 40 MG tablet TAKE 1 TABLET BY MOUTH  EVERY DAY 90 tablet 3  . sulfamethoxazole-trimethoprim (BACTRIM DS,SEPTRA DS) 800-160 MG tablet Take 1 tablet by mouth 2 (two) times daily. 14 tablet 0  . traMADol (ULTRAM) 50 MG tablet Take 1-2 tablets (50-100 mg total) by mouth every 12 (twelve) hours as needed for severe pain. 30 tablet 0  . triamterene-hydrochlorothiazide (MAXZIDE-25) 37.5-25 MG tablet TAKE 1 TABLET BY MOUTH  EVERY DAY 90 tablet 1   No current facility-administered medications on file prior to visit.    Allergies  Allergen Reactions  . Codeine     REACTION: MAKES HER SLEEPY    Recent Results (from the past 2160 hour(s))  POCT glycosylated hemoglobin (Hb A1C)     Status: Abnormal   Collection Time: 12/22/17  2:54 PM  Result Value Ref Range   Hemoglobin A1C 6.1 (A) 4.0 - 5.6 %   HbA1c POC (<> result, manual entry)     HbA1c, POC (prediabetic range)     HbA1c, POC (controlled diabetic range)    POCT urinalysis dipstick     Status: Abnormal   Collection Time: 12/22/17  2:54 PM  Result Value Ref Range   Color, UA yellow yellow   Clarity, UA clear clear   Glucose, UA negative negative mg/dL   Bilirubin, UA negative negative   Ketones, POC UA negative negative mg/dL  Spec Grav, UA 1.020 1.010 - 1.025   Blood, UA negative negative   pH, UA 6.0 5.0 - 8.0   Protein Ur, POC negative negative mg/dL   Urobilinogen, UA 0.2 0.2 or 1.0 E.U./dL   Nitrite, UA Positive (A) Negative   Leukocytes, UA Moderate (2+) (A) Negative  Urine Culture     Status: Abnormal   Collection Time: 12/22/17  3:18 PM  Result Value Ref Range   Urine Culture, Routine Final report (A)    Organism ID, Bacteria Escherichia coli (A)     Comment: Greater than 100,000 colony forming units per mL Cefazolin <=4 ug/mL Cefazolin with an MIC <=16 predicts susceptibility to the oral agents cefaclor,  cefdinir, cefpodoxime, cefprozil, cefuroxime, cephalexin, and loracarbef when used for therapy of uncomplicated urinary tract infections due to E. coli, Klebsiella pneumoniae, and Proteus mirabilis.    Antimicrobial Susceptibility Comment     Comment:       ** S = Susceptible; I = Intermediate; R = Resistant **                    P = Positive; N = Negative             MICS are expressed in micrograms per mL    Antibiotic                 RSLT#1    RSLT#2    RSLT#3    RSLT#4 Amoxicillin/Clavulanic Acid    S Ampicillin                     R Cefepime                       S Ceftriaxone                    S Cefuroxime                     S Ciprofloxacin                  S Ertapenem                      S Gentamicin                     S Imipenem                       S Levofloxacin                   S Meropenem                      S Nitrofurantoin                 S Piperacillin/Tazobactam        S Tetracycline                   S Tobramycin                     S Trimethoprim/Sulfa             S     Objective: General: Patient is awake, alert, and oriented x 3 and in no acute distress.  Integument: Skin is warm, dry and supple bilateral. Nails are tender, long, thickened and dystrophic with subungual debris, consistent with  onychomycosis, 1-5 bilateral with L hallux nail is severely thick and rams horn appearance. No signs of infection. No open lesions or preulcerative lesions present bilateral. Remaining integument unremarkable.  Vasculature:  Dorsalis Pedis pulse 1/4 bilateral. Posterior Tibial pulse  1/4 bilateral. Capillary fill time <3 sec 1-5 bilateral. Positive hair growth to the level of the digits.Temperature gradient within normal limits. No varicosities present bilateral. No edema present bilateral.   Neurology: The patient has intact sensation measured with a 5.07/10g Semmes Weinstein Monofilament at all pedal sites bilateral . Vibratory sensation intact bilateral with  tuning fork. No Babinski sign present bilateral.   Musculoskeletal: Pain with palpation to left great toenail.  No symptomatic pedal deformities noted bilateral. Muscular strength 5/5 in all lower extremity muscular groups bilateral without pain on range of motion. No tenderness with calf compression bilateral.  Assessment and Plan: Problem List Items Addressed This Visit    None     -Examined patient. -Discussed and educated patient on diabetic foot care, especially with  regards to the vascular, neurological and musculoskeletal systems.  -Stressed the importance of good glycemic control and the detriment of not controlling glucose levels in relation to the foot. -Mechanically debrided all nails 2-5 on left and 2-5 on right using sterile nail nipper and filed with dremel without incident  -Discussed treatment alternatives and plan of care for left great toenail; Explained permanent nail avulsion and post procedure course to patient. - After a verbal and written consent, injected 3 ml of a 50:50 mixture of 2% plain  lidocaine and 0.5% plain marcaine in a normal hallux block fashion. Next, a  betadine prep was performed. Anesthesia was tested and found to be appropriate.  The offending left hallux nail  was then incised from the hyponychium to the epinychium. The offending nail was removed and cleared from the field. The area was curretted for any remaining nail or spicules. Phenol application performed and the area was then flushed with alcohol and dressed with antibiotic cream and a dry sterile dressing. -Patient was instructed to leave the dressing intact for today and begin soaking  in a weak solution of betadine and water tomorrow. Patient was instructed to  soak for 15 minutes each day and apply neosporin and a gauze or bandaid dressing each day. -Patient was instructed to monitor the toe for signs of infection and return to office if toe becomes red, hot or swollen. -Patient to return in  2 weeks for nail check on left 1st toe -Work note: resume normal work on Thursday -Patient advised to call the office if any problems or questions arise in the meantime.  Landis Martins, DPM.,

## 2018-01-11 NOTE — Patient Instructions (Signed)

## 2018-01-25 ENCOUNTER — Encounter: Payer: Self-pay | Admitting: Sports Medicine

## 2018-01-25 ENCOUNTER — Ambulatory Visit (INDEPENDENT_AMBULATORY_CARE_PROVIDER_SITE_OTHER): Payer: Medicaid Other | Admitting: Sports Medicine

## 2018-01-25 DIAGNOSIS — Z9889 Other specified postprocedural states: Secondary | ICD-10-CM

## 2018-01-25 DIAGNOSIS — M79675 Pain in left toe(s): Secondary | ICD-10-CM

## 2018-01-25 NOTE — Progress Notes (Signed)
Subjective: [name] Cooper is a 68 y.o. Diabetic female patient returns to office today for follow up evaluation after having left permanent total nail avulsion performed on 01/11/2018.  Patient has been soaking using epsom salt and applying topical antibiotic covered with bandaid daily. Patient denies fever/chills/nausea/vomitting/any other related constitutional symptoms at this time.  Patient Active Problem List   Diagnosis Date Noted  . Estrogen deficiency 03/30/2016  . Rhinitis, allergic   . Pre-diabetes   . Angioedema 09/24/2014  . HTN (hypertension) 09/24/2014  . HLD (hyperlipidemia) 09/24/2014  . GERD (gastroesophageal reflux disease) 09/24/2014  . OA (osteoarthritis) 09/24/2014  . Swallowing difficulty   . PULMONARY NODULE 03/21/2008  . CHEST PAIN 03/21/2008  . NEOPLASM, MALIGNANT, BREAST, HX OF 03/21/2008    Current Outpatient Medications on File Prior to Visit  Medication Sig Dispense Refill  . acetaminophen (TYLENOL) 650 MG CR tablet Take 1,300 mg by mouth every 8 (eight) hours as needed for pain. Reported on 09/23/2015    . albuterol (PROVENTIL HFA;VENTOLIN HFA) 108 (90 Base) MCG/ACT inhaler Inhale 2 puffs into the lungs every 6 (six) hours as needed for wheezing or shortness of breath. 1 Inhaler 3  . budesonide-formoterol (SYMBICORT) 80-4.5 MCG/ACT inhaler Inhale 2 puffs into the lungs 2 (two) times daily. 1 Inhaler 3  . erythromycin ophthalmic ointment Place 1 application into the right eye at bedtime. 3.5 g 0  . fluticasone (FLONASE) 50 MCG/ACT nasal spray Place 1 spray into both nostrils daily. 16 g 0  . hydrALAZINE (APRESOLINE) 25 MG tablet Take 1 tablet (25 mg total) by mouth 3 (three) times daily. 270 tablet 2  . metFORMIN (GLUCOPHAGE) 500 MG tablet TAKE 1 TABLET BY MOUTH TWO  TIMES DAILY 180 tablet 3  . Multiple Vitamins-Minerals (SENTRY ADULT) TABS Take by mouth.    . ondansetron (ZOFRAN) 4 MG tablet Take 1 tablet (4 mg total) by mouth every 8 (eight) hours as  needed for nausea or vomiting. 12 tablet 0  . pravastatin (PRAVACHOL) 40 MG tablet TAKE 1 TABLET BY MOUTH  EVERY DAY 90 tablet 3  . sulfamethoxazole-trimethoprim (BACTRIM DS,SEPTRA DS) 800-160 MG tablet Take 1 tablet by mouth 2 (two) times daily. 14 tablet 0  . traMADol (ULTRAM) 50 MG tablet Take 1-2 tablets (50-100 mg total) by mouth every 12 (twelve) hours as needed for severe pain. 30 tablet 0  . triamterene-hydrochlorothiazide (MAXZIDE-25) 37.5-25 MG tablet TAKE 1 TABLET BY MOUTH  EVERY DAY 90 tablet 1   No current facility-administered medications on file prior to visit.     Allergies  Allergen Reactions  . Codeine     REACTION: MAKES HER SLEEPY    Objective:  General: Well developed, nourished, in no acute distress, alert and oriented x3   Dermatology: Skin is warm, dry and supple bilateral.  Left hallux nail bed appears to be clean, dry, with mild granular tissue and no surrounding eschar/scab. (-) Erythema. (-) Edema. (-) serosanguous drainage (+)maceration present. The remaining nails appear unremarkable at this time free from infection but thickened and discolored consistent with onychomycosis. There are no other lesions or other signs of infection  present.  Neurovascular status: Intact. No lower extremity swelling; No pain with calf compression bilateral.  Musculoskeletal: Decreased tenderness to palpation of the left hallux. Muscular strength within normal limits bilateral.   Assesement and Plan: Problem List Items Addressed This Visit    None    Visit Diagnoses    S/P nail surgery    -  Primary  Great toe pain, left         -Examined patient  -Cleansed left hallux nail fold and gently scrubbed with peroxide and q-tip/curetted away eschar at site and applied antibiotic cream covered with bandaid.  -Discussed plan of care with patient. -Patient to now begin soaking in a weak solution of Epsom salt and warm water once a day. Patient was instructed to soak for 15-20  minutes each day until the toe appears normal and there is no drainage, redness, tenderness, or swelling at the procedure site, and apply neosporin and a gauze or bandaid dressing each day as needed. May leave open to air at night. -Educated patient on long term care after nail surgery. -Patient was instructed to monitor the toe for reoccurrence and signs of infection; Patient advised to return to office or go to ER if toe becomes red, hot or swollen. -Patient is to return as needed or sooner if problems arise.  Landis Martins, DPM

## 2018-02-22 ENCOUNTER — Encounter: Payer: Self-pay | Admitting: Sports Medicine

## 2018-02-22 ENCOUNTER — Ambulatory Visit (INDEPENDENT_AMBULATORY_CARE_PROVIDER_SITE_OTHER): Payer: Medicaid Other | Admitting: Sports Medicine

## 2018-02-22 DIAGNOSIS — M79675 Pain in left toe(s): Secondary | ICD-10-CM

## 2018-02-22 DIAGNOSIS — Z9889 Other specified postprocedural states: Secondary | ICD-10-CM

## 2018-02-22 DIAGNOSIS — L602 Onychogryphosis: Secondary | ICD-10-CM

## 2018-02-22 NOTE — Progress Notes (Signed)
Subjective: [name] Cooper is a 67 y.o. Diabetic female patient returns to office today for another follow up evaluation after having left permanent total nail avulsion performed on 01/11/2018.  Patient has been soaking using epsom salt and applying topical antibiotic covered with bandaid daily. Patient denies fever/chills/nausea/vomitting/any other related constitutional symptoms at this time.  Patient Active Problem List   Diagnosis Date Noted  . Estrogen deficiency 03/30/2016  . Rhinitis, allergic   . Pre-diabetes   . Angioedema 09/24/2014  . HTN (hypertension) 09/24/2014  . HLD (hyperlipidemia) 09/24/2014  . GERD (gastroesophageal reflux disease) 09/24/2014  . OA (osteoarthritis) 09/24/2014  . Swallowing difficulty   . PULMONARY NODULE 03/21/2008  . CHEST PAIN 03/21/2008  . NEOPLASM, MALIGNANT, BREAST, HX OF 03/21/2008    Current Outpatient Medications on File Prior to Visit  Medication Sig Dispense Refill  . acetaminophen (TYLENOL) 650 MG CR tablet Take 1,300 mg by mouth every 8 (eight) hours as needed for pain. Reported on 09/23/2015    . albuterol (PROVENTIL HFA;VENTOLIN HFA) 108 (90 Base) MCG/ACT inhaler Inhale 2 puffs into the lungs every 6 (six) hours as needed for wheezing or shortness of breath. 1 Inhaler 3  . budesonide-formoterol (SYMBICORT) 80-4.5 MCG/ACT inhaler Inhale 2 puffs into the lungs 2 (two) times daily. 1 Inhaler 3  . erythromycin ophthalmic ointment Place 1 application into the right eye at bedtime. 3.5 g 0  . fluticasone (FLONASE) 50 MCG/ACT nasal spray Place 1 spray into both nostrils daily. 16 g 0  . hydrALAZINE (APRESOLINE) 25 MG tablet Take 1 tablet (25 mg total) by mouth 3 (three) times daily. 270 tablet 2  . metFORMIN (GLUCOPHAGE) 500 MG tablet TAKE 1 TABLET BY MOUTH TWO  TIMES DAILY 180 tablet 3  . Multiple Vitamins-Minerals (SENTRY ADULT) TABS Take by mouth.    . ondansetron (ZOFRAN) 4 MG tablet Take 1 tablet (4 mg total) by mouth every 8 (eight) hours  as needed for nausea or vomiting. 12 tablet 0  . pravastatin (PRAVACHOL) 40 MG tablet TAKE 1 TABLET BY MOUTH  EVERY DAY 90 tablet 3  . sulfamethoxazole-trimethoprim (BACTRIM DS,SEPTRA DS) 800-160 MG tablet Take 1 tablet by mouth 2 (two) times daily. 14 tablet 0  . traMADol (ULTRAM) 50 MG tablet Take 1-2 tablets (50-100 mg total) by mouth every 12 (twelve) hours as needed for severe pain. 30 tablet 0  . triamterene-hydrochlorothiazide (MAXZIDE-25) 37.5-25 MG tablet TAKE 1 TABLET BY MOUTH  EVERY DAY 90 tablet 1   No current facility-administered medications on file prior to visit.     Allergies  Allergen Reactions  . Codeine     REACTION: MAKES HER SLEEPY    Objective:  General: Well developed, nourished, in no acute distress, alert and oriented x3   Dermatology: Skin is warm, dry and supple bilateral.  Left hallux nail bed appears to be clean, dry, with mild eschar/scab. (-) Erythema. (-) Edema. (-) serosanguous drainage (-)maceration present. The remaining nails appear unremarkable at this time free from infection but thickened and discolored consistent with onychomycosis. There are no other lesions or other signs of infection  present.  Neurovascular status: Intact. No lower extremity swelling; No pain with calf compression bilateral.  Musculoskeletal: Decreased tenderness to palpation of the left hallux. Muscular strength within normal limits bilateral.   Assesement and Plan: Problem List Items Addressed This Visit    None    Visit Diagnoses    S/P nail surgery    -  Primary   Great toe pain, left  Onychogryposis         -Examined patient  -Cleansed left hallux nail fold and gently scrubbed with peroxide and q-tip/curetted away eschar at site and applied antibiotic cream covered with bandaid.  -Discussed plan of care with patient. -Patient to now begin soaking in a weak solution of Epsom salt and warm water once a day. Patient was instructed to soak for 15-20 minutes each  day until the toe appears normal and there is no drainage, redness, tenderness, or swelling at the procedure site, and apply neosporin and a gauze or bandaid dressing each day as needed. May leave open to air at night for 1 week.  -Educated patient on long term care after nail surgery. -Patient was instructed to monitor the toe for reoccurrence and signs of infection; Patient advised to return to office or go to ER if toe becomes red, hot or swollen. -Patient is to return in 6-8 weeks for routine diabetic foot care or sooner if problems arise.  Landis Martins, DPM

## 2018-04-19 ENCOUNTER — Other Ambulatory Visit: Payer: Self-pay | Admitting: Internal Medicine

## 2018-04-19 ENCOUNTER — Other Ambulatory Visit: Payer: Self-pay | Admitting: Family Medicine

## 2018-04-26 ENCOUNTER — Encounter: Payer: Self-pay | Admitting: Sports Medicine

## 2018-04-26 ENCOUNTER — Ambulatory Visit (INDEPENDENT_AMBULATORY_CARE_PROVIDER_SITE_OTHER): Payer: Medicare Other | Admitting: Sports Medicine

## 2018-04-26 DIAGNOSIS — B351 Tinea unguium: Secondary | ICD-10-CM

## 2018-04-26 DIAGNOSIS — M79675 Pain in left toe(s): Secondary | ICD-10-CM | POA: Diagnosis not present

## 2018-04-26 DIAGNOSIS — M79674 Pain in right toe(s): Secondary | ICD-10-CM | POA: Diagnosis not present

## 2018-04-26 DIAGNOSIS — E119 Type 2 diabetes mellitus without complications: Secondary | ICD-10-CM

## 2018-04-26 DIAGNOSIS — Z9889 Other specified postprocedural states: Secondary | ICD-10-CM

## 2018-04-26 NOTE — Progress Notes (Signed)
Subjective: [name] Cooper is a 68 y.o. Diabetic female patient returns to office today for another follow up evaluation after having left permanent total nail avulsion performed on 01/11/2018 and for routine diabetic foot care.  Patient states that her toes are healing up fine with no problems or issues. Patient denies fever/chills/nausea/vomitting/any other related constitutional symptoms at this time.  Patient Active Problem List   Diagnosis Date Noted  . Estrogen deficiency 03/30/2016  . Rhinitis, allergic   . Pre-diabetes   . Angioedema 09/24/2014  . HTN (hypertension) 09/24/2014  . HLD (hyperlipidemia) 09/24/2014  . GERD (gastroesophageal reflux disease) 09/24/2014  . OA (osteoarthritis) 09/24/2014  . Swallowing difficulty   . PULMONARY NODULE 03/21/2008  . CHEST PAIN 03/21/2008  . NEOPLASM, MALIGNANT, BREAST, HX OF 03/21/2008    Current Outpatient Medications on File Prior to Visit  Medication Sig Dispense Refill  . acetaminophen (TYLENOL) 650 MG CR tablet Take 1,300 mg by mouth every 8 (eight) hours as needed for pain. Reported on 09/23/2015    . albuterol (PROVENTIL HFA;VENTOLIN HFA) 108 (90 Base) MCG/ACT inhaler Inhale 2 puffs into the lungs every 6 (six) hours as needed for wheezing or shortness of breath. 1 Inhaler 3  . budesonide-formoterol (SYMBICORT) 80-4.5 MCG/ACT inhaler Inhale 2 puffs into the lungs 2 (two) times daily. 1 Inhaler 3  . erythromycin ophthalmic ointment Place 1 application into the right eye at bedtime. 3.5 g 0  . fluticasone (FLONASE) 50 MCG/ACT nasal spray Place 1 spray into both nostrils daily. 16 g 0  . hydrALAZINE (APRESOLINE) 25 MG tablet TAKE 1 TABLET BY MOUTH 3  TIMES DAILY 270 tablet 2  . metFORMIN (GLUCOPHAGE) 500 MG tablet TAKE 1 TABLET BY MOUTH TWO  TIMES DAILY 180 tablet 3  . Multiple Vitamins-Minerals (SENTRY ADULT) TABS Take by mouth.    . ondansetron (ZOFRAN) 4 MG tablet Take 1 tablet (4 mg total) by mouth every 8 (eight) hours as needed  for nausea or vomiting. 12 tablet 0  . pravastatin (PRAVACHOL) 40 MG tablet TAKE 1 TABLET BY MOUTH  EVERY DAY 90 tablet 3  . sulfamethoxazole-trimethoprim (BACTRIM DS,SEPTRA DS) 800-160 MG tablet Take 1 tablet by mouth 2 (two) times daily. 14 tablet 0  . traMADol (ULTRAM) 50 MG tablet Take 1-2 tablets (50-100 mg total) by mouth every 12 (twelve) hours as needed for severe pain. 30 tablet 0  . triamterene-hydrochlorothiazide (MAXZIDE-25) 37.5-25 MG tablet TAKE 1 TABLET BY MOUTH  EVERY DAY 90 tablet 1   No current facility-administered medications on file prior to visit.     Allergies  Allergen Reactions  . Codeine     REACTION: MAKES HER SLEEPY    Objective:  General: Well developed, nourished, in no acute distress, alert and oriented x3   Dermatology: Skin is warm, dry and supple bilateral.  Left hallux nail bed appears to be clean, dry, with mild eschar/scab. (-) Erythema. (-) Edema. (-) serosanguous drainage (-)maceration present. The remaining nails appear unremarkable at this time free from infection but thickened and discolored consistent with onychomycosis and elongated. There are no other lesions or other signs of infection  present.  Neurovascular status: Intact. No lower extremity swelling; No pain with calf compression bilateral.  Musculoskeletal: Decreased tenderness to palpation of the left hallux. Muscular strength within normal limits bilateral.   Assesement and Plan: Problem List Items Addressed This Visit    None    Visit Diagnoses    Pain due to onychomycosis of toenails of both feet    -  Primary   Diabetes mellitus without complication (HCC)       S/P nail surgery         -Examined patient  -Mechanically debrided nails x8 using sterile nail nipper and advised patient to both big toes protect with Band-Aids as needed when in shoes otherwise leave open to air -Advised patient to closely monitor feet in the setting of diabetes -Patient is to return in 10-12 weeks  for routine diabetic foot care with myself or Dr. Elisha Ponder or sooner if problems arise.  Landis Martins, DPM

## 2018-06-03 ENCOUNTER — Ambulatory Visit: Payer: Medicare Other | Admitting: Family Medicine

## 2018-06-07 ENCOUNTER — Ambulatory Visit (INDEPENDENT_AMBULATORY_CARE_PROVIDER_SITE_OTHER): Payer: Medicare Other | Admitting: Family Medicine

## 2018-06-07 ENCOUNTER — Encounter: Payer: Self-pay | Admitting: Family Medicine

## 2018-06-07 VITALS — BP 116/78 | HR 71 | Temp 97.9°F | Ht 66.0 in | Wt 272.0 lb

## 2018-06-07 DIAGNOSIS — Z87898 Personal history of other specified conditions: Secondary | ICD-10-CM

## 2018-06-07 DIAGNOSIS — F17210 Nicotine dependence, cigarettes, uncomplicated: Secondary | ICD-10-CM

## 2018-06-07 DIAGNOSIS — E8881 Metabolic syndrome: Secondary | ICD-10-CM | POA: Diagnosis not present

## 2018-06-07 DIAGNOSIS — I10 Essential (primary) hypertension: Secondary | ICD-10-CM

## 2018-06-07 DIAGNOSIS — E7800 Pure hypercholesterolemia, unspecified: Secondary | ICD-10-CM

## 2018-06-07 DIAGNOSIS — R5383 Other fatigue: Secondary | ICD-10-CM

## 2018-06-07 DIAGNOSIS — N393 Stress incontinence (female) (male): Secondary | ICD-10-CM

## 2018-06-07 DIAGNOSIS — Z9011 Acquired absence of right breast and nipple: Secondary | ICD-10-CM

## 2018-06-07 DIAGNOSIS — E559 Vitamin D deficiency, unspecified: Secondary | ICD-10-CM | POA: Diagnosis not present

## 2018-06-07 DIAGNOSIS — J439 Emphysema, unspecified: Secondary | ICD-10-CM

## 2018-06-07 DIAGNOSIS — Z79899 Other long term (current) drug therapy: Secondary | ICD-10-CM

## 2018-06-07 DIAGNOSIS — E88819 Insulin resistance, unspecified: Secondary | ICD-10-CM

## 2018-06-07 DIAGNOSIS — D509 Iron deficiency anemia, unspecified: Secondary | ICD-10-CM | POA: Diagnosis not present

## 2018-06-07 DIAGNOSIS — Z853 Personal history of malignant neoplasm of breast: Secondary | ICD-10-CM

## 2018-06-07 DIAGNOSIS — E2839 Other primary ovarian failure: Secondary | ICD-10-CM | POA: Diagnosis not present

## 2018-06-07 LAB — CBC WITH DIFFERENTIAL/PLATELET
Basophils Absolute: 0.1 10*3/uL (ref 0.0–0.1)
Basophils Relative: 1 % (ref 0.0–3.0)
Eosinophils Absolute: 0.6 10*3/uL (ref 0.0–0.7)
Eosinophils Relative: 5.5 % — ABNORMAL HIGH (ref 0.0–5.0)
HCT: 36.7 % (ref 36.0–46.0)
Hemoglobin: 11.6 g/dL — ABNORMAL LOW (ref 12.0–15.0)
Lymphocytes Relative: 42.7 % (ref 12.0–46.0)
Lymphs Abs: 4.3 10*3/uL — ABNORMAL HIGH (ref 0.7–4.0)
MCHC: 31.6 g/dL (ref 30.0–36.0)
MCV: 69.3 fl — ABNORMAL LOW (ref 78.0–100.0)
Monocytes Absolute: 0.8 10*3/uL (ref 0.1–1.0)
Monocytes Relative: 8.3 % (ref 3.0–12.0)
Neutro Abs: 4.3 10*3/uL (ref 1.4–7.7)
Neutrophils Relative %: 42.5 % — ABNORMAL LOW (ref 43.0–77.0)
Platelets: 332 10*3/uL (ref 150.0–400.0)
RBC: 5.29 Mil/uL — ABNORMAL HIGH (ref 3.87–5.11)
RDW: 16.7 % — ABNORMAL HIGH (ref 11.5–15.5)
WBC: 10.2 10*3/uL (ref 4.0–10.5)

## 2018-06-07 LAB — COMPREHENSIVE METABOLIC PANEL
ALT: 9 U/L (ref 0–35)
AST: 14 U/L (ref 0–37)
Albumin: 3.8 g/dL (ref 3.5–5.2)
Alkaline Phosphatase: 92 U/L (ref 39–117)
BUN: 14 mg/dL (ref 6–23)
CO2: 30 mEq/L (ref 19–32)
Calcium: 9.2 mg/dL (ref 8.4–10.5)
Chloride: 105 mEq/L (ref 96–112)
Creatinine, Ser: 1.02 mg/dL (ref 0.40–1.20)
GFR: 65.11 mL/min (ref >60.00)
Glucose, Bld: 95 mg/dL (ref 70–99)
Potassium: 4.2 mEq/L (ref 3.5–5.1)
Sodium: 142 mEq/L (ref 135–145)
Total Bilirubin: 0.4 mg/dL (ref 0.2–1.2)
Total Protein: 7.1 g/dL (ref 6.0–8.3)

## 2018-06-07 LAB — LIPID PANEL
Cholesterol: 242 mg/dL — ABNORMAL HIGH (ref 0–200)
HDL: 48.6 mg/dL (ref >39.00)
NonHDL: 193.01
Total CHOL/HDL Ratio: 5
Triglycerides: 311 mg/dL — ABNORMAL HIGH (ref 0.0–149.0)
VLDL: 62.2 mg/dL — ABNORMAL HIGH (ref 0.0–40.0)

## 2018-06-07 LAB — VITAMIN B12: Vitamin B-12: 541 pg/mL (ref 211–911)

## 2018-06-07 LAB — LDL CHOLESTEROL, DIRECT: Direct LDL: 159 mg/dL

## 2018-06-07 LAB — TSH: TSH: 2.22 u[IU]/mL (ref 0.35–4.50)

## 2018-06-07 LAB — HEMOGLOBIN A1C: Hgb A1c MFr Bld: 6.3 % (ref 4.6–6.5)

## 2018-06-07 NOTE — Progress Notes (Signed)
[name] Cooper is a 68 y.o. female is here to [facility].   Patient Care Team: Briscoe Deutscher, DO as PCP - General (Family Medicine)    History of Present Illness:   HPI: See Assessment and Plan section for Problem Based Charting of issues discussed today.  Health Maintenance Due  Topic Date Due  . OPHTHALMOLOGY EXAM  12/06/1959  . MAMMOGRAM  09/25/2017   Depression screen PHQ 2/9 06/08/2018  Decreased Interest 0  Down, Depressed, Hopeless 0  PHQ - 2 Score 0  Altered sleeping 1  Tired, decreased energy 0  Change in appetite 2  Feeling bad or failure about yourself  0  Trouble concentrating 0  Moving slowly or fidgety/restless 0  Suicidal thoughts 0  PHQ-9 Score 3  Difficult doing work/chores Not difficult at all    PMHx, SurgHx, SocialHx, Medications, and Allergies were reviewed in the Visit Navigator and updated as appropriate.   Past Medical History:  Diagnosis Date  . Arthritis   . Cancer ([place])   . Chicken pox   . Chronic lower back pain   . Diabetes ([place])   . GERD (gastroesophageal reflux disease)   . Hepatitis 1970's  . History of recurrent UTIs   . Hyperlipemia   . Hypertension   . Slow heart rate dx'd 09/24/2014  . Urine incontinence      Past Surgical History:  Procedure Laterality Date  . ABDOMINAL HYSTERECTOMY  1984  . APPENDECTOMY    . BREAST BIOPSY Right 1996  . BREAST LUMPECTOMY Right 1996  . MASTECTOMY Right 1996  . TONSILLECTOMY       Family History  Problem Relation Age of Onset  . Cancer Mother   . Early death Mother   . Arthritis Brother   . Colon cancer Neg Hx     Social History   Tobacco Use  . Smoking status: Light Tobacco Smoker    Packs/day: 0.25    Years: 43.00    Pack years: 10.75    Types: Cigarettes    Last attempt to quit: 12/05/2016    Years since quitting: 1.5  . Smokeless tobacco: Never Used  . Tobacco comment: 09/24/2014 "I've quit smoking 3 times"  Substance Use Topics  . Alcohol use: No   Alcohol/week: 0.0 standard drinks  . Drug use: No    Types: "Crack" cocaine    Comment: 09/24/2014 "last crack was in 2013"    Current Medications and Allergies   .  acetaminophen (TYLENOL) 650 MG CR tablet, Take 1,300 mg by mouth every 8 (eight) hours as needed for pain. Reported on 09/23/2015, Disp: , Rfl:  .  albuterol (PROVENTIL HFA;VENTOLIN HFA) 108 (90 Base) MCG/ACT inhaler, Inhale 2 puffs into the lungs every 6 (six) hours as needed for wheezing or shortness of breath., Disp: 1 Inhaler, Rfl: 3 .  hydrALAZINE (APRESOLINE) 25 MG tablet, TAKE 1 TABLET BY MOUTH 3  TIMES DAILY, Disp: 270 tablet, Rfl: 2 .  metFORMIN (GLUCOPHAGE) 500 MG tablet, TAKE 1 TABLET BY MOUTH TWO  TIMES DAILY, Disp: 180 tablet, Rfl: 3 .  Multiple Vitamins-Minerals (SENTRY ADULT) TABS, Take by mouth., Disp: , Rfl:  .  ondansetron (ZOFRAN) 4 MG tablet, Take 1 tablet (4 mg total) by mouth every 8 (eight) hours as needed for nausea or vomiting., Disp: 12 tablet, Rfl: 0 .  pravastatin (PRAVACHOL) 40 MG tablet, Take 40 mg by mouth daily., Disp: , Rfl:  .  triamterene-hydrochlorothiazide (MAXZIDE-25) 37.5-25 MG tablet, TAKE 1 TABLET BY MOUTH  EVERY DAY, Disp: 90 tablet, Rfl: 1 .  budesonide-formoterol (SYMBICORT) 80-4.5 MCG/ACT inhaler, Inhale 2 puffs into the lungs 2 (two) times daily. (Patient not taking: Reported on 06/07/2018), Disp: 1 Inhaler, Rfl: 3   Allergies  Allergen Reactions  . Codeine     REACTION: MAKES HER SLEEPY   Review of Systems   Pertinent items are noted in the HPI. Otherwise, a complete ROS is negative.  Vitals   Vitals:   06/07/18 1414  BP: 116/78  Pulse: 71  Temp: 97.9 F (36.6 C)  TempSrc: Oral  SpO2: 97%  Weight: 272 lb (123.4 kg)  Height: 5\' 6"  (1.676 m)     Body mass index is 43.9 kg/m.  Physical Exam   Physical Exam Vitals signs and nursing note reviewed.  HENT:     Head: Normocephalic and atraumatic.  Eyes:     Pupils: Pupils are equal, round, and reactive to light.  Neck:      Musculoskeletal: Normal range of motion and neck supple.  Cardiovascular:     Rate and Rhythm: Normal rate and regular rhythm.     Heart sounds: Normal heart sounds.  Pulmonary:     Effort: Pulmonary effort is normal.  Abdominal:     Palpations: Abdomen is soft.  Skin:    General: Skin is warm.  Psychiatric:        Behavior: Behavior normal.    Results for orders placed or performed in visit on 06/07/18  CBC with Differential/Platelet  Result Value Ref Range   WBC 10.2 4.0 - 10.5 K/uL   RBC 5.29 (H) 3.87 - 5.11 Mil/uL   Hemoglobin 11.6 (L) 12.0 - 15.0 g/dL   HCT 36.7 36.0 - 46.0 %   MCV 69.3 Repeated and verified X2. (L) 78.0 - 100.0 fl   MCHC 31.6 30.0 - 36.0 g/dL   RDW 16.7 (H) 11.5 - 15.5 %   Platelets 332.0 150.0 - 400.0 K/uL   Neutrophils Relative % 42.5 (L) 43.0 - 77.0 %   Lymphocytes Relative 42.7 12.0 - 46.0 %   Monocytes Relative 8.3 3.0 - 12.0 %   Eosinophils Relative 5.5 (H) 0.0 - 5.0 %   Basophils Relative 1.0 0.0 - 3.0 %   Neutro Abs 4.3 1.4 - 7.7 K/uL   Lymphs Abs 4.3 (H) 0.7 - 4.0 K/uL   Monocytes Absolute 0.8 0.1 - 1.0 K/uL   Eosinophils Absolute 0.6 0.0 - 0.7 K/uL   Basophils Absolute 0.1 0.0 - 0.1 K/uL  Comprehensive metabolic panel  Result Value Ref Range   Sodium 142 135 - 145 mEq/L   Potassium 4.2 3.5 - 5.1 mEq/L   Chloride 105 96 - 112 mEq/L   CO2 30 19 - 32 mEq/L   Glucose, Bld 95 70 - 99 mg/dL   BUN 14 6 - 23 mg/dL   Creatinine, Ser 1.02 0.40 - 1.20 mg/dL   Total Bilirubin 0.4 0.2 - 1.2 mg/dL   Alkaline Phosphatase 92 39 - 117 U/L   AST 14 0 - 37 U/L   ALT 9 0 - 35 U/L   Total Protein 7.1 6.0 - 8.3 g/dL   Albumin 3.8 3.5 - 5.2 g/dL   Calcium 9.2 8.4 - 10.5 mg/dL   GFR 65.11 >60.00 mL/min  Hemoglobin A1c  Result Value Ref Range   Hgb A1c MFr Bld 6.3 4.6 - 6.5 %  Iron, TIBC and Ferritin Panel  Result Value Ref Range   Iron 43 (L) 45 - 160 mcg/dL  TIBC 381 250 - 450 mcg/dL (calc)   %SAT 11 (L) 16 - 45 % (calc)   Ferritin 11 (L) 16  - 288 ng/mL  Lipid panel  Result Value Ref Range   Cholesterol 242 (H) 0 - 200 mg/dL   Triglycerides 311.0 (H) 0.0 - 149.0 mg/dL   HDL 48.60 >39.00 mg/dL   VLDL 62.2 (H) 0.0 - 40.0 mg/dL   Total CHOL/HDL Ratio 5    NonHDL 193.01   Vitamin B12  Result Value Ref Range   Vitamin B-12 541 211 - 911 pg/mL  TSH  Result Value Ref Range   TSH 2.22 0.35 - 4.50 uIU/mL  LDL cholesterol, direct  Result Value Ref Range   Direct LDL 159.0 mg/dL   Assessment and Plan   Microcytic anemia Lab Results  Component Value Date   WBC 10.2 06/07/2018   HGB 11.6 (L) 06/07/2018   HCT 36.7 06/07/2018   MCV 69.3 Repeated and verified X2. (L) 06/07/2018   PLT 332.0 06/07/2018   Lab Results  Component Value Date   IRON 43 (L) 06/07/2018   TIBC 381 06/07/2018   FERRITIN 11 (L) 06/07/2018   Patient with iron deficiency anemia.  Will recommend supplementation and recheck labs in 3 months.  Will consider iron infusion if patient is willing.  History of solitary pulmonary nodule History of pulmonary nodule.  Noted during chart review.  Last CT documented in our system was in 2009.  She is a smoker so does require further testing if this has not been completed.  We will see if we can go ahead and order a low-dose CT scan.  Estrogen deficiency Patient with history of right breast cancer and is status post right breast mastectomy.  She qualified for hormone replacement therapy.  No concerns for vasomotor symptoms.  Due for a DEXA.  HTN (hypertension) Patient with history of hypertension, controlled on Maxide, hydralazine.  She denies chest pain, shortness of breath, edema.  Note lisinopril caused angioedema.  Will add to her allergy list.  History of right mastectomy Patient has some discomfort and insecurity after right breast mastectomy.  We will go ahead and order a right mastectomy bra.  Insulin resistance Patient with history of insulin resistance.  She was previously on metformin but has not taken  it in about 2 weeks because she ran out.  She does not watch her diet or exercise.  She is on a statin.  No ACE/arb due to angioedema.  [facility]  Smoker.  The 10-year ASCVD risk score Mikey Bussing DC Brooke Bonito., et al., 2013) is: 41.2%   Values used to calculate the score:     Age: 41 years     Sex: Female     Is Non-Hispanic African American: Yes     Diabetic: Yes     Tobacco smoker: Yes     Systolic Blood Pressure: 027 mmHg     Is BP treated: Yes     HDL Cholesterol: 48.6 mg/dL     Total Cholesterol: 242 mg/dL  Lab Results  Component Value Date   HGBA1C 6.3 06/07/2018    Nicotine dependence Recommend smoking cessation.  Will also recommend a low-dose CT screening.  Urine, incontinence, stress female Chronic but worsening.  She uses pads, especially if she has a cough.  She declines pelvic floor physical therapy, stating that she is tried in the past and it was not helpful.  History of right breast cancer Strong family history of  breast cancer and ovarian cancer.  Patient has a personal history of breast cancer.  She states that her daughter was offered and went through genetic testing.  She will bring that information with her to the next visit.  Hyperlipidemia Lab Results  Component Value Date   CHOL 242 (H) 06/07/2018   HDL 48.60 06/07/2018   LDLCALC 94 07/19/2017   LDLDIRECT 159.0 06/07/2018   TRIG 311.0 (H) 06/07/2018   CHOLHDL 5 06/07/2018   Uncontrolled at this time.  The patient is taking pravastatin 40 mg p.o. daily.  She denies myalgias.  Will increase medication.  Orders Placed This Encounter  Procedures  . DME Other see comment  . CBC with Differential/Platelet  . Comprehensive metabolic panel  . Hemoglobin A1c  . Iron, TIBC and Ferritin Panel  . Lipid panel  . Vitamin B12  . TSH  . LDL cholesterol, direct   No orders of the defined types were placed in this encounter.   . Reviewed expectations re: course of current medical  issues. . Discussed self-management of symptoms. . Outlined signs and symptoms indicating need for more acute intervention. . Patient verbalized understanding and all questions were answered. Marland Kitchen Health Maintenance issues including appropriate healthy diet, exercise, and smoking avoidance were discussed with patient. . See orders for this visit as documented in the electronic medical record. . Patient received an After Visit Summary.  Briscoe Deutscher, DO [place] [place] 06/15/2018  Records requested if needed. Time spent with the patient: 45 minutes, of which >50% was spent in obtaining information about her symptoms, reviewing her previous labs, evaluations, and treatments, counseling her about her condition (please see the discussed topics above), and developing a plan to further investigate it; she had a number of questions which I addressed.

## 2018-06-08 LAB — IRON,TIBC AND FERRITIN PANEL
%SAT: 11 % (calc) — ABNORMAL LOW (ref 16–45)
Ferritin: 11 ng/mL — ABNORMAL LOW (ref 16–288)
Iron: 43 ug/dL — ABNORMAL LOW (ref 45–160)
TIBC: 381 mcg/dL (calc) (ref 250–450)

## 2018-06-15 ENCOUNTER — Encounter: Payer: Self-pay | Admitting: Family Medicine

## 2018-06-15 DIAGNOSIS — J439 Emphysema, unspecified: Secondary | ICD-10-CM | POA: Insufficient documentation

## 2018-06-15 DIAGNOSIS — D509 Iron deficiency anemia, unspecified: Secondary | ICD-10-CM | POA: Insufficient documentation

## 2018-06-15 DIAGNOSIS — E559 Vitamin D deficiency, unspecified: Secondary | ICD-10-CM | POA: Insufficient documentation

## 2018-06-15 DIAGNOSIS — F17200 Nicotine dependence, unspecified, uncomplicated: Secondary | ICD-10-CM | POA: Insufficient documentation

## 2018-06-15 DIAGNOSIS — Z9011 Acquired absence of right breast and nipple: Secondary | ICD-10-CM | POA: Insufficient documentation

## 2018-06-15 DIAGNOSIS — N393 Stress incontinence (female) (male): Secondary | ICD-10-CM | POA: Insufficient documentation

## 2018-06-15 NOTE — Assessment & Plan Note (Addendum)
Patient with history of insulin resistance.  She was previously on metformin but has not taken it in about 2 weeks because she ran out.  She does not watch her diet or exercise.  She is on a statin.  No ACE/arb due to angioedema.  [facility]  Smoker.  The 10-year ASCVD risk score Mikey Bussing DC Brooke Bonito., et al., 2013) is: 41.2%   Values used to calculate the score:     Age: 68 years     Sex: Female     Is Non-Hispanic African American: Yes     Diabetic: Yes     Tobacco smoker: Yes     Systolic Blood Pressure: 509 mmHg     Is BP treated: Yes     HDL Cholesterol: 48.6 mg/dL     Total Cholesterol: 242 mg/dL  Lab Results  Component Value Date   HGBA1C 6.3 06/07/2018

## 2018-06-15 NOTE — Assessment & Plan Note (Signed)
Chronic but worsening.  She uses pads, especially if she has a cough.  She declines pelvic floor physical therapy, stating that she is tried in the past and it was not helpful.

## 2018-06-15 NOTE — Assessment & Plan Note (Signed)
Lab Results  Component Value Date   CHOL 242 (H) 06/07/2018   HDL 48.60 06/07/2018   LDLCALC 94 07/19/2017   LDLDIRECT 159.0 06/07/2018   TRIG 311.0 (H) 06/07/2018   CHOLHDL 5 06/07/2018   Uncontrolled at this time.  The patient is taking pravastatin 40 mg p.o. daily.  She denies myalgias.  Will increase medication.

## 2018-06-15 NOTE — Assessment & Plan Note (Signed)
History of pulmonary nodule.  Noted during chart review.  Last CT documented in our system was in 2009.  She is a smoker so does require further testing if this has not been completed.  We will see if we can go ahead and order a low-dose CT scan.

## 2018-06-15 NOTE — Assessment & Plan Note (Signed)
Strong family history of breast cancer and ovarian cancer.  Patient has a personal history of breast cancer.  She states that her daughter was offered and went through genetic testing.  She will bring that information with her to the next visit.

## 2018-06-15 NOTE — Assessment & Plan Note (Signed)
Patient with history of right breast cancer and is status post right breast mastectomy.  She qualified for hormone replacement therapy.  No concerns for vasomotor symptoms.  Due for a DEXA.

## 2018-06-15 NOTE — Assessment & Plan Note (Signed)
Patient has some discomfort and insecurity after right breast mastectomy.  We will go ahead and order a right mastectomy bra.

## 2018-06-15 NOTE — Assessment & Plan Note (Signed)
Lab Results  Component Value Date   WBC 10.2 06/07/2018   HGB 11.6 (L) 06/07/2018   HCT 36.7 06/07/2018   MCV 69.3 Repeated and verified X2. (L) 06/07/2018   PLT 332.0 06/07/2018   Lab Results  Component Value Date   IRON 43 (L) 06/07/2018   TIBC 381 06/07/2018   FERRITIN 11 (L) 06/07/2018   Patient with iron deficiency anemia.  Will recommend supplementation and recheck labs in 3 months.  Will consider iron infusion if patient is willing.

## 2018-06-15 NOTE — Assessment & Plan Note (Signed)
Recommend smoking cessation.  Will also recommend a low-dose CT screening.

## 2018-06-15 NOTE — Assessment & Plan Note (Signed)
Patient with history of hypertension, controlled on Maxide, hydralazine.  She denies chest pain, shortness of breath, edema.  Note lisinopril caused angioedema.  Will add to her allergy list.

## 2018-06-16 ENCOUNTER — Encounter: Payer: Self-pay | Admitting: Family Medicine

## 2018-06-16 ENCOUNTER — Other Ambulatory Visit (HOSPITAL_COMMUNITY)
Admission: RE | Admit: 2018-06-16 | Discharge: 2018-06-16 | Disposition: A | Discharge status: Home / Self Care | Payer: Medicare Other | Source: Ambulatory Visit | Attending: Family Medicine | Admitting: Family Medicine

## 2018-06-16 ENCOUNTER — Ambulatory Visit (INDEPENDENT_AMBULATORY_CARE_PROVIDER_SITE_OTHER): Payer: Medicare Other | Admitting: Family Medicine

## 2018-06-16 VITALS — BP 134/82 | HR 49 | Temp 97.7°F | Ht 66.0 in | Wt 268.6 lb

## 2018-06-16 DIAGNOSIS — N3000 Acute cystitis without hematuria: Secondary | ICD-10-CM | POA: Diagnosis not present

## 2018-06-16 DIAGNOSIS — N898 Other specified noninflammatory disorders of vagina: Secondary | ICD-10-CM | POA: Insufficient documentation

## 2018-06-16 LAB — POCT URINALYSIS DIPSTICK
Bilirubin, UA: NEGATIVE
Blood, UA: NEGATIVE
Glucose, UA: NEGATIVE
Ketones, UA: NEGATIVE
Nitrite, UA: POSITIVE
PH UA: 6.5 (ref 5.0–8.0)
Protein, UA: NEGATIVE
SPEC GRAV UA: 1.02 (ref 1.010–1.025)
Urobilinogen, UA: 0.2 E.U./dL

## 2018-06-16 MED ORDER — CEFUROXIME AXETIL 500 MG PO TABS: 500.0000 mg | ORAL_TABLET | Freq: Two times a day (BID) | ORAL | 0 refills | Status: DC

## 2018-06-16 NOTE — Progress Notes (Signed)
Patient: [name] Cooper MRN: [id number] DOB: July 03, 1950 PCP: Briscoe Deutscher, DO     Subjective:  Chief Complaint  Patient presents with  . burning w/urination  . Back Pain    HPI: The patient is a 68 y.o. female who presents today for burning with urination and some vaginal discharge. She saw Dr. Juleen China last week and about 2 days after she saw her she started to have burning with urination, frequency, urgency. No visible blood. She states her urine is odorous and has a color change. She also has some new flank pain. No fever/chills. She is drinking water. Hx of controlled diabetes-no SGLT2 inhibitor. She had a urine infection about 6-7 months ago. She has had no other changes to her medication/lifestyle. Has not used any otc medication to help symptoms of uti.   She also has a vaginal discharge that started 3 days ago. It is very itchy and is orange in color. She has no adnexal pain and is not sexually active. Denies any vaginal bleeding.   Review of Systems  Constitutional: Negative for chills, diaphoresis and fever.  Respiratory: Negative for cough and shortness of breath.   Gastrointestinal: Positive for abdominal pain (suprapubic ). Negative for diarrhea, nausea and vomiting.  Genitourinary: Positive for dysuria, frequency, urgency and vaginal discharge. Negative for difficulty urinating, flank pain, hematuria, pelvic pain, vaginal bleeding and vaginal pain.       Pt c/o thick light orange like d/c.  Itchy in nature  Musculoskeletal: Positive for back pain.       C/o lower back pain    Allergies Patient is allergic to lisinopril and codeine.  Past Medical History Patient  has a past medical history of Arthritis, Cancer ([place]), Chicken pox, Chronic lower back pain, Diabetes ([place]), GERD (gastroesophageal reflux disease), Hepatitis (1970's), History of recurrent UTIs, Hyperlipemia, Hypertension, Slow heart rate (dx'd 09/24/2014), and Urine incontinence.  Surgical History Patient   has a past surgical history that includes Tonsillectomy; Appendectomy; Abdominal hysterectomy (1984); Mastectomy (Right, 1996); Breast biopsy (Right, 1996); and Breast lumpectomy (Right, 1996).  Family History Pateint's family history includes Arthritis in her brother; Cancer in her mother; Early death in her mother.  Social History Patient  reports that she has been smoking cigarettes. She has a 10.75 pack-year smoking history. She has never used smokeless tobacco. She reports that she does not drink alcohol or use drugs.    Objective: Vitals:   06/16/18 1121  BP: 134/82  Pulse: (!) 49  Temp: 97.7 F (36.5 C)  TempSrc: Oral  SpO2: 96%  Weight: 268 lb 9.6 oz (121.8 kg)  Height: 5\' 6"  (1.676 m)    Body mass index is 43.35 kg/m.  Physical Exam Vitals signs reviewed.  Constitutional:      Appearance: She is obese.     Comments: Tobacco odor.   Neck:     Musculoskeletal: Normal range of motion and neck supple.  Cardiovascular:     Rate and Rhythm: Normal rate and regular rhythm.     Heart sounds: Normal heart sounds.  Pulmonary:     Effort: Pulmonary effort is normal. No respiratory distress.     Breath sounds: Normal breath sounds. No wheezing or rales.  Abdominal:     General: Bowel sounds are normal.     Palpations: Abdomen is soft.     Tenderness: There is abdominal tenderness (suprapubic). There is no right CVA tenderness or left CVA tenderness.  Skin:    Capillary Refill: Capillary refill takes less than 2  seconds.  Neurological:     General: No focal deficit present.     Mental Status: She is alert and oriented to person, place, and time.       nitrite: +, le: 1+, odorous, no blood  Assessment/plan: 1. Acute cystitis without hematuria Will send in course of ceftin. No cva tenderness on exam.  Culture pending. Push fluids. Fever/chills/worsening pain go to ER.  - POCT Urinalysis Dipstick - Urine Culture  2. Vaginal discharge Sounds like yeast. Recommended  otc monistat. Will check swab for gc/c/trich/bv/cvag and send in diflucan if needed. Patient self-swabbed.  - Cervicovaginal ancillary only( [place])   Return if symptoms worsen or fail to improve.   [name], MD [place]   06/16/2018

## 2018-06-16 NOTE — Patient Instructions (Signed)
Urinary Tract Infection, Adult A urinary tract infection (UTI) is an infection of any part of the urinary tract. The urinary tract includes:  The kidneys.  The ureters.  The bladder.  The urethra. These organs make, store, and get rid of pee (urine) in the body. What are the causes? This is caused by germs (bacteria) in your genital area. These germs grow and cause swelling (inflammation) of your urinary tract. What increases the risk? You are more likely to develop this condition if:  You have a small, thin tube (catheter) to drain pee.  You cannot control when you pee or poop (incontinence).  You are female, and: ? You use these methods to prevent pregnancy: ? A medicine that kills sperm (spermicide). ? A device that blocks sperm (diaphragm). ? You have low levels of a female hormone (estrogen). ? You are pregnant.  You have genes that add to your risk.  You are sexually active.  You take antibiotic medicines.  You have trouble peeing because of: ? A prostate that is bigger than normal, if you are female. ? A blockage in the part of your body that drains pee from the bladder (urethra). ? A kidney stone. ? A nerve condition that affects your bladder (neurogenic bladder). ? Not getting enough to drink. ? Not peeing often enough.  You have other conditions, such as: ? Diabetes. ? A weak disease-fighting system (immune system). ? Sickle cell disease. ? Gout. ? Injury of the spine. What are the signs or symptoms? Symptoms of this condition include:  Needing to pee right away (urgently).  Peeing often.  Peeing small amounts often.  Pain or burning when peeing.  Blood in the pee.  Pee that smells bad or not like normal.  Trouble peeing.  Pee that is cloudy.  Fluid coming from the vagina, if you are female.  Pain in the belly or lower back. Other symptoms include:  Throwing up (vomiting).  No urge to eat.  Feeling mixed up (confused).  Being tired  and grouchy (irritable).  A fever.  Watery poop (diarrhea). How is this treated? This condition may be treated with:  Antibiotic medicine.  Other medicines.  Drinking enough water. Follow these instructions at home:  Medicines  Take over-the-counter and prescription medicines only as told by your doctor.  If you were prescribed an antibiotic medicine, take it as told by your doctor. Do not stop taking it even if you start to feel better. General instructions  Make sure you: ? Pee until your bladder is empty. ? Do not hold pee for a long time. ? Empty your bladder after sex. ? Wipe from front to back after pooping if you are a female. Use each tissue one time when you wipe.  Drink enough fluid to keep your pee pale yellow.  Keep all follow-up visits as told by your doctor. This is important. Contact a doctor if:  You do not get better after 1-2 days.  Your symptoms go away and then come back. Get help right away if:  You have very bad back pain.  You have very bad pain in your lower belly.  You have a fever.  You are sick to your stomach (nauseous).  You are throwing up. Summary  A urinary tract infection (UTI) is an infection of any part of the urinary tract.  This condition is caused by germs in your genital area.  There are many risk factors for a UTI. These include having a small, thin   tube to drain pee and not being able to control when you pee or poop.  Treatment includes antibiotic medicines for germs.  Drink enough fluid to keep your pee pale yellow. This information is not intended to replace advice given to you by your health care provider. Make sure you discuss any questions you have with your health care provider. Document Released: 09/23/2007 Document Revised: 10/14/2017 Document Reviewed: 10/14/2017 Elsevier Interactive Patient Education  2019 Else[employer] Inc.  

## 2018-06-17 ENCOUNTER — Other Ambulatory Visit: Payer: Self-pay

## 2018-06-17 DIAGNOSIS — Z122 Encounter for screening for malignant neoplasm of respiratory organs: Secondary | ICD-10-CM

## 2018-06-17 DIAGNOSIS — E2839 Other primary ovarian failure: Secondary | ICD-10-CM

## 2018-06-17 MED ORDER — ROSUVASTATIN CALCIUM 10 MG PO TABS: 10.0000 mg | ORAL_TABLET | Freq: Every day | ORAL | 3 refills | Status: DC

## 2018-06-18 LAB — URINE CULTURE
MICRO NUMBER:: 251179
SPECIMEN QUALITY: ADEQUATE

## 2018-06-20 ENCOUNTER — Other Ambulatory Visit: Payer: Self-pay | Admitting: Internal Medicine

## 2018-06-20 ENCOUNTER — Ambulatory Visit (INDEPENDENT_AMBULATORY_CARE_PROVIDER_SITE_OTHER)
Admission: RE | Admit: 2018-06-20 | Discharge: 2018-06-20 | Disposition: A | Discharge status: Home / Self Care | Payer: Medicare Other | Source: Ambulatory Visit | Attending: Family Medicine | Admitting: Family Medicine

## 2018-06-20 DIAGNOSIS — E2839 Other primary ovarian failure: Secondary | ICD-10-CM | POA: Diagnosis not present

## 2018-06-21 LAB — CERVICOVAGINAL ANCILLARY ONLY
Bacterial vaginitis: NEGATIVE
Candida vaginitis: POSITIVE — AB
Chlamydia: NEGATIVE
Neisseria Gonorrhea: NEGATIVE
Trichomonas: POSITIVE — AB

## 2018-06-22 ENCOUNTER — Ambulatory Visit: Payer: Medicare Other | Admitting: Family Medicine

## 2018-06-23 ENCOUNTER — Other Ambulatory Visit: Payer: Self-pay | Admitting: Family Medicine

## 2018-06-23 MED ORDER — FLUCONAZOLE 150 MG PO TABS: 150.0000 mg | ORAL_TABLET | Freq: Once | ORAL | 0 refills | Status: AC

## 2018-06-23 MED ORDER — METRONIDAZOLE 500 MG PO TABS: 2000.0000 mg | ORAL_TABLET | Freq: Once | ORAL | 0 refills | Status: AC

## 2018-07-26 ENCOUNTER — Ambulatory Visit: Payer: Medicaid Other | Admitting: Podiatry

## 2018-08-16 NOTE — Telephone Encounter (Signed)
Message sent to provider 

## 2018-09-19 ENCOUNTER — Ambulatory Visit (INDEPENDENT_AMBULATORY_CARE_PROVIDER_SITE_OTHER): Payer: Medicare Other | Admitting: Family Medicine

## 2018-09-19 ENCOUNTER — Other Ambulatory Visit (HOSPITAL_COMMUNITY)
Admission: RE | Admit: 2018-09-19 | Discharge: 2018-09-19 | Disposition: A | Discharge status: Home / Self Care | Payer: Medicare Other | Source: Ambulatory Visit | Attending: Family Medicine | Admitting: Family Medicine

## 2018-09-19 ENCOUNTER — Ambulatory Visit: Payer: Medicare Other

## 2018-09-19 ENCOUNTER — Encounter: Payer: Self-pay | Admitting: Family Medicine

## 2018-09-19 ENCOUNTER — Other Ambulatory Visit: Payer: Self-pay

## 2018-09-19 VITALS — BP 120/69 | HR 85 | Ht 66.0 in | Wt 268.0 lb

## 2018-09-19 DIAGNOSIS — J439 Emphysema, unspecified: Secondary | ICD-10-CM | POA: Diagnosis not present

## 2018-09-19 DIAGNOSIS — D509 Iron deficiency anemia, unspecified: Secondary | ICD-10-CM | POA: Diagnosis not present

## 2018-09-19 DIAGNOSIS — R300 Dysuria: Secondary | ICD-10-CM | POA: Diagnosis not present

## 2018-09-19 DIAGNOSIS — E7800 Pure hypercholesterolemia, unspecified: Secondary | ICD-10-CM

## 2018-09-19 DIAGNOSIS — Z87898 Personal history of other specified conditions: Secondary | ICD-10-CM

## 2018-09-19 DIAGNOSIS — E88819 Insulin resistance, unspecified: Secondary | ICD-10-CM

## 2018-09-19 DIAGNOSIS — F17210 Nicotine dependence, cigarettes, uncomplicated: Secondary | ICD-10-CM

## 2018-09-19 DIAGNOSIS — E8881 Metabolic syndrome: Secondary | ICD-10-CM

## 2018-09-19 MED ORDER — ROSUVASTATIN CALCIUM 10 MG PO TABS: 10.0000 mg | ORAL_TABLET | Freq: Every day | ORAL | 3 refills | Status: DC

## 2018-09-19 NOTE — Progress Notes (Signed)
Virtual Visit via Video   Due to the COVID-19 pandemic, this visit was completed with telemedicine (audio/video) technology to reduce patient and provider exposure as well as to preserve personal protective equipment.   I connected with [name] by a video enabled telemedicine application and verified that I am speaking with the correct person using two identifiers. Location patient: Home Location provider: [facility], Office Persons participating in the virtual visit: Ethleen Lormand, Briscoe Deutscher, DO Lonell Grandchild, CMA acting as scribe for Dr. Briscoe Deutscher.   I discussed the limitations of evaluation and management by telemedicine and the availability of in person appointments. The patient expressed understanding and agreed to proceed.  Care Team   Patient Care Team: Briscoe Deutscher, DO as PCP - General (Family Medicine)  Subjective:   HPI: Patient has had some increased urinary symptoms. She has had pain with urination and some low back pain for over a week. She was seen by Dr. Rogers Blocker with same symptoms a few weeks ago. Had improvement with treatment. Symptoms went away completely but came back. + e coli and trichomonas, s/p cipro and flagyl.   She is also has some swelling in left leg from hip to foot. She does have pain with selling that rates 5/10. It has gotten worse over time. She denies any shortness of breath or increased fatigue. She feels like the selling had increased over time. She is taking maxzide daily. She has been setting more. She has not had any increased blood pressure or any other symptoms at this time. She denies any skin changes. If any redness or rashes she will call our office. She will work on keeping elevated.   Microcytic anemia       Lab Results  Component Value Date   WBC 10.2 06/07/2018   HGB 11.6 (L) 06/07/2018   HCT 36.7 06/07/2018   MCV 69.3 Repeated and verified X2. (L) 06/07/2018   PLT 332.0 06/07/2018        Lab Results    Component Value Date   IRON 43 (L) 06/07/2018   TIBC 381 06/07/2018   FERRITIN 11 (L) 06/07/2018   Patient with iron deficiency anemia. She has been taking iron over the counter daily. She has had increased constipation with use.  Will consider iron infusion if patient is willing.  History of solitary pulmonary nodule History of pulmonary nodule.  Noted during chart review.  Last CT documented in our system was in 2009.  She is a smoker so does require further testing if this has not been completed.  We will see if we can go ahead and order a low-dose CT scan.  Estrogen deficiency Patient with history of right breast cancer and is status post right breast mastectomy.  She qualified for hormone replacement therapy.  No concerns for vasomotor symptoms.  Due for a DEXA.  HTN (hypertension) Patient with history of hypertension, controlled on Maxide, hydralazine.  She denies chest pain, shortness of breath, edema.  Note lisinopril caused angioedema.  Will add to her allergy list.  History of right mastectomy Patient has some discomfort and insecurity after right breast mastectomy.  We will go ahead and order a right mastectomy bra.  Insulin resistance Patient with history of insulin resistance.  She was previously on metformin but has not taken it in about 2 weeks because she ran out.  She does not watch her diet or exercise.  She is on a statin.  No ACE/ARB due to angioedema.  [facility].  Smoker.  The 10-year ASCVD risk score Mikey Bussing DC Brooke Bonito., et al., 2013) is: 41.2%   Values used to calculate the score:     Age: 68 years     Sex: Female     Is Non-Hispanic African American: Yes     Diabetic: Yes     Tobacco smoker: Yes     Systolic Blood Pressure: 782 mmHg     Is BP treated: Yes     HDL Cholesterol: 48.6 mg/dL     Total Cholesterol: 242 mg/dL       Lab Results  Component Value Date   HGBA1C 6.3 06/07/2018   Nicotine dependence Recommend  smoking cessation.  Will also recommend a low-dose CT screening.  Urine, incontinence, stress female Chronic but worsening.  She uses pads, especially if she has a cough.  She declines pelvic floor physical therapy, stating that she is tried in the past and it was not helpful.  History of right breast cancer Strong family history of breast cancer and ovarian cancer.  Patient has a personal history of breast cancer.  Daughter had genetic testing done.   Hyperlipidemia  Crestor started at last visit.   Patient Active Problem List   Diagnosis Date Noted  . Microcytic anemia 06/15/2018  . History of right mastectomy 06/15/2018  . Vitamin D deficiency 06/15/2018  . Emphysema of lung ([place]) 06/15/2018  . Nicotine dependence 06/15/2018  . Urine, incontinence, stress female 06/15/2018  . Estrogen deficiency 03/30/2016  . Rhinitis, allergic   . Insulin resistance   . HTN (hypertension) 09/24/2014  . Hyperlipidemia 09/24/2014  . GERD (gastroesophageal reflux disease) 09/24/2014  . OA (osteoarthritis) 09/24/2014  . History of solitary pulmonary nodule 03/21/2008  . History of right breast cancer 03/21/2008    Social History   Tobacco Use  . Smoking status: Light Tobacco Smoker    Packs/day: 0.25    Years: 43.00    Pack years: 10.75    Types: Cigarettes    Last attempt to quit: 12/05/2016    Years since quitting: 1.7  . Smokeless tobacco: Never Used  . Tobacco comment: 09/24/2014 "I've quit smoking 3 times"  Substance Use Topics  . Alcohol use: No    Alcohol/week: 0.0 standard drinks    Current Outpatient Medications:  .  acetaminophen (TYLENOL) 650 MG CR tablet, Take 1,300 mg by mouth every 8 (eight) hours as needed for pain. Reported on 09/23/2015, Disp: , Rfl:  .  albuterol (PROVENTIL HFA;VENTOLIN HFA) 108 (90 Base) MCG/ACT inhaler, Inhale 2 puffs into the lungs every 6 (six) hours as needed for wheezing or shortness of breath., Disp: 1 Inhaler, Rfl: 3 .  hydrALAZINE (APRESOLINE)  25 MG tablet, TAKE 1 TABLET BY MOUTH 3  TIMES DAILY, Disp: 270 tablet, Rfl: 2 .  Multiple Vitamins-Minerals (SENTRY ADULT) TABS, Take by mouth., Disp: , Rfl:  .  rosuvastatin (CRESTOR) 10 MG tablet, Take 1 tablet (10 mg total) by mouth at bedtime., Disp: 90 tablet, Rfl: 3 .  triamterene-hydrochlorothiazide (MAXZIDE-25) 37.5-25 MG tablet, TAKE 1 TABLET BY MOUTH  EVERY DAY, Disp: 90 tablet, Rfl: 1  Allergies  Allergen Reactions  . Lisinopril Anaphylaxis  . Codeine     REACTION: MAKES HER SLEEPY    Objective:   VITALS: Per patient if applicable, see vitals. GENERAL: Alert and in no acute distress. CARDIOPULMONARY: No increased WOB. Speaking in clear sentences.  PSYCH: Pleasant and cooperative. Speech normal rate and rhythm. Affect is appropriate. Insight  and judgement are appropriate. Attention is focused, linear, and appropriate.  NEURO: Oriented as arrived to appointment on time with no prompting.   Depression screen [facility] 2/9 09/19/2018 06/08/2018 06/07/2018  Decreased Interest 0 0 0  Down, Depressed, Hopeless 0 0 0  PHQ - 2 Score 0 0 0  Altered sleeping 0 1 1  Tired, decreased energy 0 0 0  Change in appetite 0 2 2  Feeling bad or failure about yourself  0 0 0  Trouble concentrating 0 0 0  Moving slowly or fidgety/restless 0 0 0  Suicidal thoughts 0 0 0  PHQ-9 Score 0 3 3  Difficult doing work/chores Not difficult at all Not difficult at all Not difficult at all    Assessment and Plan:   Sayge was seen today for follow-up.  Diagnoses and all orders for this visit:  Insulin resistance  Pure hypercholesterolemia -     rosuvastatin (CRESTOR) 10 MG tablet; Take 1 tablet (10 mg total) by mouth at bedtime.  Pulmonary emphysema, unspecified emphysema type ([place]) -     Ambulatory referral to Pulmonology  History of solitary pulmonary nodule -     Ambulatory referral to Pulmonology  Cigarette nicotine dependence without complication -     Ambulatory referral to  Pulmonology  Dysuria -     Urine cytology ancillary only -     Urinalysis, Routine w reflex microscopic -     Urine Culture  Microcytic anemia -     CBC with Differential/Platelet -     Iron, TIBC and Ferritin Panel   . COVID-19 Education: The signs and symptoms of COVID-19 were discussed with the patient and how to seek care for testing if needed. The importance of social distancing was discussed today. . Reviewed expectations re: course of current medical issues. . Discussed self-management of symptoms. . Outlined signs and symptoms indicating need for more acute intervention. . Patient verbalized understanding and all questions were answered. Marland Kitchen Health Maintenance issues including appropriate healthy diet, exercise, and smoking avoidance were discussed with patient. . See orders for this visit as documented in the electronic medical record.  Briscoe Deutscher, DO  Records requested if needed. Time spent: 25 minutes, of which >50% was spent in obtaining information about her symptoms, reviewing her previous labs, evaluations, and treatments, counseling her about her condition (please see the discussed topics above), and developing a plan to further investigate it; she had a number of questions which I addressed.

## 2018-09-20 LAB — URINALYSIS, ROUTINE W REFLEX MICROSCOPIC
Bilirubin Urine: NEGATIVE
Hgb urine dipstick: NEGATIVE
Ketones, ur: NEGATIVE
Nitrite: POSITIVE — AB
Specific Gravity, Urine: 1.015 (ref 1.000–1.030)
Total Protein, Urine: NEGATIVE
Urine Glucose: NEGATIVE
Urobilinogen, UA: 0.2 (ref 0.0–1.0)
pH: 7.5 (ref 5.0–8.0)

## 2018-09-20 LAB — CBC WITH DIFFERENTIAL/PLATELET
Basophils Absolute: 0.1 10*3/uL (ref 0.0–0.1)
Basophils Relative: 1.2 % (ref 0.0–3.0)
Eosinophils Absolute: 0.7 10*3/uL (ref 0.0–0.7)
Eosinophils Relative: 5.7 % — ABNORMAL HIGH (ref 0.0–5.0)
HCT: 39.6 % (ref 36.0–46.0)
Hemoglobin: 12.6 g/dL (ref 12.0–15.0)
Lymphocytes Relative: 45.5 % (ref 12.0–46.0)
Lymphs Abs: 5.4 10*3/uL — ABNORMAL HIGH (ref 0.7–4.0)
MCHC: 31.9 g/dL (ref 30.0–36.0)
MCV: 71.3 fl — ABNORMAL LOW (ref 78.0–100.0)
Monocytes Absolute: 1 10*3/uL (ref 0.1–1.0)
Monocytes Relative: 8.1 % (ref 3.0–12.0)
Neutro Abs: 4.7 10*3/uL (ref 1.4–7.7)
Neutrophils Relative %: 39.5 % — ABNORMAL LOW (ref 43.0–77.0)
Platelets: 299 10*3/uL (ref 150.0–400.0)
RBC: 5.55 Mil/uL — ABNORMAL HIGH (ref 3.87–5.11)
RDW: 17.8 % — ABNORMAL HIGH (ref 11.5–15.5)
WBC: 11.8 10*3/uL — ABNORMAL HIGH (ref 4.0–10.5)

## 2018-09-20 MED ORDER — CIPROFLOXACIN HCL 250 MG PO TABS: 250.0000 mg | ORAL_TABLET | Freq: Two times a day (BID) | ORAL | 0 refills | Status: AC

## 2018-09-20 NOTE — Addendum Note (Signed)
Addended by: Briscoe Deutscher R on: 09/20/2018 11:43 AM   Modules accepted: Orders

## 2018-09-21 LAB — IRON,TIBC AND FERRITIN PANEL
%SAT: 20 % (calc) (ref 16–45)
Ferritin: 21 ng/mL (ref 16–288)
Iron: 66 ug/dL (ref 45–160)
TIBC: 336 mcg/dL (calc) (ref 250–450)

## 2018-09-21 LAB — URINE CULTURE
MICRO NUMBER:: 523927
SPECIMEN QUALITY:: ADEQUATE

## 2018-09-21 LAB — URINE CYTOLOGY ANCILLARY ONLY
Chlamydia: NEGATIVE
Neisseria Gonorrhea: NEGATIVE
Trichomonas: NEGATIVE

## 2018-09-22 ENCOUNTER — Other Ambulatory Visit: Payer: Self-pay

## 2018-09-23 ENCOUNTER — Other Ambulatory Visit: Payer: Self-pay | Admitting: Family Medicine

## 2018-09-23 DIAGNOSIS — Z1231 Encounter for screening mammogram for malignant neoplasm of breast: Secondary | ICD-10-CM

## 2018-09-23 MED ORDER — TRIAMTERENE-HCTZ 37.5-25 MG PO TABS: 1.0000 | ORAL_TABLET | Freq: Every day | ORAL | 1 refills | Status: DC

## 2018-09-24 LAB — URINE CYTOLOGY ANCILLARY ONLY
Bacterial vaginitis: NEGATIVE
Candida vaginitis: NEGATIVE

## 2018-10-11 ENCOUNTER — Encounter: Payer: Self-pay | Admitting: Internal Medicine

## 2018-10-11 ENCOUNTER — Ambulatory Visit (INDEPENDENT_AMBULATORY_CARE_PROVIDER_SITE_OTHER): Payer: Medicare Other | Admitting: Internal Medicine

## 2018-10-11 ENCOUNTER — Other Ambulatory Visit: Payer: Self-pay

## 2018-10-11 ENCOUNTER — Ambulatory Visit (INDEPENDENT_AMBULATORY_CARE_PROVIDER_SITE_OTHER): Payer: Medicare Other

## 2018-10-11 DIAGNOSIS — F17210 Nicotine dependence, cigarettes, uncomplicated: Secondary | ICD-10-CM | POA: Diagnosis not present

## 2018-10-11 DIAGNOSIS — R05 Cough: Secondary | ICD-10-CM

## 2018-10-11 DIAGNOSIS — R059 Cough, unspecified: Secondary | ICD-10-CM | POA: Insufficient documentation

## 2018-10-11 NOTE — Progress Notes (Signed)
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 Nodule f/u s/p last ct 2009 showing GG changes only     History of Present Illness  10/11/2018  Pulmonary/ 1st office eval/Ysidro Ramsay  Chief Complaint  Patient presents with   Pulmonary Consult    Referred by Dr Briscoe Deutscher for eval of pulmonary nodule.   Dyspnea:  Not limited by breathing from desired activities  But very sedentary  Cough: when gets hot starts coughing, non prod x years Sleep: on side 2 pillows  Be flat SABA use: prn helps cough.  No obvious day to day or daytime variability or assoc excess/ purulent sputum or mucus plugs or hemoptysis or cp or chest tightness, subjective wheeze or overt sinus or hb symptoms.   Sleeping without nocturnal  or early am exacerbation  of respiratory  c/o's or need for noct saba. Also denies any obvious fluctuation of symptoms with weather or environmental changes or other aggravating or alleviating factors except as outlined above   No unusual exposure hx or h/o childhood pna/ asthma or knowledge of premature birth.  Current Allergies, Complete Past Medical History, Past Surgical History, Family History, and Social History were reviewed in [employer] record.  ROS  The following are not active complaints unless bolded Hoarseness, sore throat, dysphagia, dental problems, itching, sneezing,  nasal congestion or discharge of excess mucus or purulent secretions, ear ache,   fever, chills, sweats, unintended wt loss or wt gain, classically pleuritic or exertional cp,  orthopnea pnd or arm/hand swelling  or leg swelling, presyncope, palpitations, abdominal pain, anorexia, nausea, vomiting, diarrhea  or change in bowel habits or change in bladder habits, change in stools or change  in urine, dysuria, hematuria,  rash, arthralgias, visual complaints, headache, numbness, weakness or ataxia or problems with walking or coordination,  change in mood or  memory.             Past Medical History:  Diagnosis Date   Arthritis    Cancer ([place])    Chicken pox    Chronic lower back pain    Diabetes (HCC)    GERD (gastroesophageal reflux disease)    Hepatitis 1970's   History of recurrent UTIs    Hyperlipemia    Hypertension    Slow heart rate dx'd 09/24/2014   Urine incontinence     Outpatient Medications Prior to Visit  Medication Sig Dispense Refill   acetaminophen (TYLENOL) 650 MG CR tablet Take 1,300 mg by mouth every 8 (eight) hours as needed for pain. Reported on 09/23/2015     albuterol (PROVENTIL HFA;VENTOLIN HFA) 108 (90 Base) MCG/ACT inhaler Inhale 2 puffs into the lungs every 6 (six) hours as needed for wheezing or shortness of breath. 1 Inhaler 3   Ferrous Sulfate (IRON PO) Take 1 tablet by mouth daily.     hydrALAZINE (APRESOLINE) 25 MG tablet TAKE 1 TABLET BY MOUTH 3  TIMES DAILY 270 tablet 2   Multiple Vitamins-Minerals (SENTRY ADULT) TABS Take by mouth.     rosuvastatin (CRESTOR) 10 MG tablet Take 1 tablet (10 mg total) by mouth at bedtime. 90 tablet 3   triamterene-hydrochlorothiazide (MAXZIDE-25) 37.5-25 MG tablet Take 1 tablet by mouth daily. 90 tablet 1  Objective:     BP 118/74 (BP Location: Left Arm, Cuff Size: Large)    Pulse 71    Temp 98 F (36.7 C) (Oral)    Ht 5' 6"  (1.676 m)    Wt 286 lb (129.7 kg)    LMP  (LMP Unknown)    SpO2 96%    BMI 46.16 kg/m   SpO2: 96 % RA    pleasant obese bf nad  HEENT: Full dentures/ nl  turbinates bilaterally, and oropharynx. Nl external ear canals without cough reflex   NECK :  without JVD/Nodes/TM/ nl carotid upstrokes bilaterally   LUNGS: no acc muscle use,  Nl contour chest which is clear to A and P bilaterally without cough on insp or exp maneuvers   CV:  RRR  no s3 or  murmur or increase in P2, and no edema   ABD: quite obese/  nontender with nl inspiratory excursion in the supine position. No bruits or organomegaly appreciated, bowel sounds nl  MS:  Nl gait/ ext warm without deformities, calf tenderness, cyanosis or clubbing No obvious joint restrictions   SKIN: warm and dry without lesions    NEURO:  alert, approp, nl sensorium with  no motor or cerebellar deficits apparent.      CXR PA and Lateral:   10/11/2018 :    I personally reviewed images and agree with radiology impression as follows:   No findings  My impression: nothing to correlate with CT findings from 2009  In [facility]         Assessment   Cough Onset around 2018   Minimal and controlled with saba so probably represents cough variant asthma and if so All goals of chronic asthma control met including optimal function and elimination of symptoms with minimal need for rescue therapy on no maint rx   Contingencies discussed in full including contacting this office immediately if not controlling the symptoms using the rule of two's, namely : If your breathing worsens or you need to use your rescue inhaler more than twice weekly or wake up more than twice a month with any respiratory symptoms or require more than two rescue inhalers per year, we need to see you right away because this means we're not controlling the underlying problem (inflammation) adequately.  Rescue inhalers (albuterol) do not control inflammation and overuse can lead to unnecessary and costly consequences.  They can make you feel better temporarily but eventually they will quit working effectively much as sleep aids lead to more insomnia if used regularly.     >> key is to stop smoking (see separate a/p)   >> ideally needs pfts when COVID - 19 restrictions have been lifted.     Morbid (severe) obesity due to excess calories (HCC) Body mass index is 46.16 kg/m.  -   Lab Results  Component Value Date   TSH 2.22  06/07/2018     Contributing to gerd risk/ doe/reviewed the need and the process to achieve and maintain neg calorie balance > defer f/u primary care including intermittently monitoring thyroid status       Cigarette smoker Counseled re importance of smoking cessation but did not meet time criteria for separate billing but discussed in context of lung cancer screening     Since she has nl baseline cxr and no significant symptoms she is a candidate for LDCT:  Low-dose CT lung cancer screening is recommended for patients whoare 37-23 years of age with a 30+pack-year history of smoking, and who  are currently smoking or quit <=15 years ago.   Advised stopping smoking will have more impact on her health than entering the program for screening though there is certainly no reason she can't do both   >>> referred for shared decision making.  Pulmonary f/u is prn      Total time devoted to counseling  > 50 % of initial 60 min office visit:  review case with pt/ discussion of options/alternatives/ personally creating written customized instructions  in presence of pt  then going over those specific  Instructions directly with the pt including how to use all of the meds but in particular covering each new medication in detail and the difference between the maintenance= "automatic" meds and the prns using an action plan format for the latter (If this problem/symptom => do that organization reading Left to right).  Please see AVS from this visit for a full list of these instructions which I personally wrote for this pt and  are unique to this visit.      Christinia Gully, MD 10/11/2018

## 2018-10-11 NOTE — Patient Instructions (Signed)
The key is to stop smoking completely before smoking completely stops you! -  For smoking cessation information call [phone]    Please remember to go to the  x-ray department  for your tests - we will call you with the results when they are available     Set up a shared decision making appt to see Eric Form NP re LDSCT scanning yearly for at leas 3 years

## 2018-10-12 ENCOUNTER — Encounter: Payer: Self-pay | Admitting: Internal Medicine

## 2018-10-12 DIAGNOSIS — F17210 Nicotine dependence, cigarettes, uncomplicated: Secondary | ICD-10-CM | POA: Insufficient documentation

## 2018-10-12 DIAGNOSIS — Z87891 Personal history of nicotine dependence: Secondary | ICD-10-CM | POA: Insufficient documentation

## 2018-10-12 NOTE — Assessment & Plan Note (Signed)
Counseled re importance of smoking cessation but did not meet time criteria for separate billing but discussed in context of lung cancer screening     Since she has nl baseline cxr and no significant symptoms she is a candidate for LDCT:  Low-dose CT lung cancer screening is recommended for patients whoare 71-69 years of age with a 30+pack-year history of smoking, and who are currently smoking or quit <=15 years ago.   Advised stopping smoking will have more impact on her health than entering the program for screening though there is certainly no reason she can't do both   >>> referred for shared decision making.  Pulmonary f/u is prn     Total time devoted to counseling  > 50 % of initial 60 min office visit:  review case with pt/ discussion of options/alternatives/ personally creating written customized instructions  in presence of pt  then going over those specific  Instructions directly with the pt including how to use all of the meds but in particular covering each new medication in detail and the difference between the maintenance= "automatic" meds and the prns using an action plan format for the latter (If this problem/symptom => do that organization reading Left to right).  Please see AVS from this visit for a full list of these instructions which I personally wrote for this pt and  are unique to this visit.

## 2018-10-12 NOTE — Assessment & Plan Note (Signed)
Body mass index is 46.16 kg/m.  -   Lab Results  Component Value Date   TSH 2.22 06/07/2018     Contributing to gerd risk/ doe/reviewed the need and the process to achieve and maintain neg calorie balance > defer f/u primary care including intermittently monitoring thyroid status

## 2018-10-12 NOTE — Assessment & Plan Note (Signed)
Onset around 2018   Minimal and controlled with saba so probably represents cough variant asthma and if so All goals of chronic asthma control met including optimal function and elimination of symptoms with minimal need for rescue therapy on no maint rx   Contingencies discussed in full including contacting this office immediately if not controlling the symptoms using the rule of two's, namely : If your breathing worsens or you need to use your rescue inhaler more than twice weekly or wake up more than twice a month with any respiratory symptoms or require more than two rescue inhalers per year, we need to see you right away because this means we're not controlling the underlying problem (inflammation) adequately.  Rescue inhalers (albuterol) do not control inflammation and overuse can lead to unnecessary and costly consequences.  They can make you feel better temporarily but eventually they will quit working effectively much as sleep aids lead to more insomnia if used regularly.     >> key is to stop smoking (see separate a/p)   >> ideally needs pfts when COVID - 19 restrictions have been lifted.

## 2018-10-14 ENCOUNTER — Ambulatory Visit (INDEPENDENT_AMBULATORY_CARE_PROVIDER_SITE_OTHER): Payer: Medicare Other | Admitting: Podiatry

## 2018-10-14 ENCOUNTER — Other Ambulatory Visit: Payer: Self-pay

## 2018-10-14 ENCOUNTER — Encounter: Payer: Self-pay | Admitting: Podiatry

## 2018-10-14 VITALS — Temp 98.5°F

## 2018-10-14 DIAGNOSIS — B351 Tinea unguium: Secondary | ICD-10-CM

## 2018-10-14 DIAGNOSIS — M79675 Pain in left toe(s): Secondary | ICD-10-CM

## 2018-10-14 DIAGNOSIS — M79674 Pain in right toe(s): Secondary | ICD-10-CM | POA: Diagnosis not present

## 2018-10-14 NOTE — Patient Instructions (Signed)

## 2018-10-22 NOTE — Progress Notes (Signed)
Subjective:  [name] with h/o insulin resistance and  painful, thick, discolored, elongated toenails 1-5 b/l that become tender and cannot cut because of thickness. Pain is aggravated when wearing enclosed shoe gear.  Briscoe Deutscher, DO is her PCP and last visit was 09/19/2018.   Current Outpatient Medications:  .  acetaminophen (TYLENOL) 650 MG CR tablet, Take 1,300 mg by mouth every 8 (eight) hours as needed for pain. Reported on 09/23/2015, Disp: , Rfl:  .  albuterol (PROVENTIL HFA;VENTOLIN HFA) 108 (90 Base) MCG/ACT inhaler, Inhale 2 puffs into the lungs every 6 (six) hours as needed for wheezing or shortness of breath., Disp: 1 Inhaler, Rfl: 3 .  Ferrous Sulfate (IRON PO), Take 1 tablet by mouth daily., Disp: , Rfl:  .  hydrALAZINE (APRESOLINE) 25 MG tablet, TAKE 1 TABLET BY MOUTH 3  TIMES DAILY, Disp: 270 tablet, Rfl: 2 .  Multiple Vitamins-Minerals (SENTRY ADULT) TABS, Take by mouth., Disp: , Rfl:  .  rosuvastatin (CRESTOR) 10 MG tablet, Take 1 tablet (10 mg total) by mouth at bedtime., Disp: 90 tablet, Rfl: 3 .  triamterene-hydrochlorothiazide (MAXZIDE-25) 37.5-25 MG tablet, Take 1 tablet by mouth daily., Disp: 90 tablet, Rfl: 1   Allergies  Allergen Reactions  . Lisinopril Anaphylaxis  . Codeine     REACTION: MAKES HER SLEEPY     Objective: Vitals:   10/14/18 1438  Temp: 98.5 F (36.9 C)    Physical Examination:  Vascular Examination: Capillary refill time <3 seconds x 10 digits.  DP/PT pulses 1/4 b/l.  Digital hair present b/l.  No edema noted b/l.  Skin temperature gradient WNL b/l.  Dermatological Examination: Skin with normal turgor, texture and tone b/l.  No open wounds b/l.  No interdigital macerations noted b/l.  Elongated, thick, discolored brittle toenails with subungual debris and pain on dorsal palpation of nailbeds 1-5 b/l.  Musculoskeletal Examination: Muscle strength 5/5 to all muscle groups b/l  No pain, crepitus or joint discomfort  with active/passive ROM.  Neurological Examination: Sensation intact 5/5 b/l with 10 gram monofilament.  Vibratory sensation intact b/l.  Proprioceptive sensation intact b/l.  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 Toenails 1-5 b/l were debrided in length and girth without iatrogenic laceration. 2.  Continue soft, supportive shoe gear daily. 3.  Report any pedal injuries to medical professional. 4.  Follow up 3 months. 5.  Patient/POA to call should there be a question/concern in there interim.

## 2018-10-24 ENCOUNTER — Other Ambulatory Visit: Payer: Self-pay | Admitting: *Deleted

## 2018-10-24 DIAGNOSIS — Z87891 Personal history of nicotine dependence: Secondary | ICD-10-CM

## 2018-10-24 DIAGNOSIS — F17210 Nicotine dependence, cigarettes, uncomplicated: Secondary | ICD-10-CM

## 2018-10-24 DIAGNOSIS — Z122 Encounter for screening for malignant neoplasm of respiratory organs: Secondary | ICD-10-CM

## 2018-11-07 ENCOUNTER — Ambulatory Visit (INDEPENDENT_AMBULATORY_CARE_PROVIDER_SITE_OTHER): Payer: Medicare Other | Admitting: Acute Care

## 2018-11-07 ENCOUNTER — Other Ambulatory Visit: Payer: Self-pay

## 2018-11-07 ENCOUNTER — Encounter: Payer: Self-pay | Admitting: Acute Care

## 2018-11-07 ENCOUNTER — Ambulatory Visit
Admission: RE | Admit: 2018-11-07 | Discharge: 2018-11-07 | Disposition: A | Discharge status: Home / Self Care | Payer: Medicare Other | Source: Ambulatory Visit | Attending: Acute Care | Admitting: Acute Care

## 2018-11-07 VITALS — BP 120/72 | HR 83 | Temp 98.6°F | Ht 66.0 in | Wt 283.8 lb

## 2018-11-07 DIAGNOSIS — Z122 Encounter for screening for malignant neoplasm of respiratory organs: Secondary | ICD-10-CM

## 2018-11-07 DIAGNOSIS — Z87891 Personal history of nicotine dependence: Secondary | ICD-10-CM

## 2018-11-07 DIAGNOSIS — F17210 Nicotine dependence, cigarettes, uncomplicated: Secondary | ICD-10-CM

## 2018-11-07 NOTE — Progress Notes (Signed)
Shared Decision Making Visit Lung Cancer Screening Program [phone])   Eligibility:  Age 68 y.o.  Pack Years Smoking History Calculation 30 pack years  (# packs/per year x # years smoked)  Recent History of coughing up blood  no  Unexplained weight loss? no ( >Than 15 pounds within the last 6 months )  Prior History Lung / other cancer no (Diagnosis within the last 5 years already requiring surveillance chest CT Scans).  Smoking Status Current Smoker  Former Smokers:NA  Quit Date: 06/2018  Visit Components:  Discussion included one or more decision making aids. yes  Discussion included risk/benefits of screening. yes  Discussion included potential follow up diagnostic testing for abnormal scans. yes  Discussion included meaning and risk of over diagnosis. yes  Discussion included meaning and risk of False Positives. yes  Discussion included meaning of total radiation exposure. yes  Counseling Included:  Importance of adherence to annual lung cancer LDCT screening. yes  Impact of comorbidities on ability to participate in the program. yes  Ability and willingness to under diagnostic treatment. yes  Smoking Cessation Counseling:  Current Smokers:   Discussed importance of smoking cessation. Yes  Information about tobacco cessation classes and interventions provided to patient. yes  Patient provided with "ticket" for LDCT Scan. yes  Symptomatic Patient. no  Counseling  Diagnosis Code: Tobacco Use Z72.0  Asymptomatic Patient yes  Counseling (Intermediate counseling: > three minutes counseling) H3716  Former Smokers:   Discussed the importance of maintaining cigarette abstinence. yes  Diagnosis Code: Personal History of Nicotine Dependence. R67.893  Information about tobacco cessation classes and interventions provided to patient. Yes  Patient provided with "ticket" for LDCT Scan. yes  Written Order for Lung Cancer Screening with LDCT placed in Epic.  Yes (CT Chest Lung Cancer Screening Low Dose W/O CM) [id number] Z12.2-Screening of respiratory organs Z87.891-Personal history of nicotine dependence  BP 120/72 (BP Location: Left Arm, Cuff Size: Normal)   Pulse 83   Temp 98.6 F (37 C) (Oral)   Ht 5\' 6"  (1.676 m)   Wt 283 lb 12.8 oz (128.7 kg)   LMP  (LMP Unknown)   SpO2 97%   BMI 45.81 kg/m     I have spent 25 minutes of face to face time with [name] Cooper discussing the risks and benefits of lung cancer screening. We viewed a power point together that explained in detail the above noted topics. We paused at intervals to allow for questions to be asked and answered to ensure understanding.We discussed that the single most powerful action that she can take to decrease her risk of developing lung cancer is to quit smoking. We discussed whether or not she is ready to commit to setting a quit date. We discussed options for tools to aid in quitting smoking including nicotine replacement therapy, non-nicotine medications, support groups, Quit Smart classes, and behavior modification. We discussed that often times setting smaller, more achievable goals, such as eliminating 1 cigarette a day for a week and then 2 cigarettes a day for a week can be helpful in slowly decreasing the number of cigarettes smoked. This allows for a sense of accomplishment as well as providing a clinical benefit. I gave her the " Be Stronger Than Your Excuses" card with contact information for community resources, classes, free nicotine replacement therapy, and access to mobile apps, text messaging, and on-line smoking cessation help. I have also given her my card and contact information in the event she needs to contact me. We  discussed the time and location of the scan, and that either Doroteo Glassman RN or I will call with the results within 24-48 hours of receiving them. I have offered her  a copy of the power point we viewed  as a resource in the event they need reinforcement of  the concepts we discussed today in the office. The patient verbalized understanding of all of  the above and had no further questions upon leaving the office. They have my contact information in the event they have any further questions.  I spent 3 minutes counseling on smoking cessation and the health risks of continued tobacco abuse.  I explained to the patient that there has been a high incidence of coronary artery disease noted on these exams. I explained that this is a non-gated exam therefore degree or severity cannot be determined. This patient is currently on statin therapy. I have asked the patient to follow-up with their PCP regarding any incidental finding of coronary artery disease and management with diet or medication as their PCP  feels is clinically indicated. The patient verbalized understanding of the above and had no further questions upon completion of the visit.   [name] Spatz, NP 11/07/2018 4:30 PM

## 2018-11-10 ENCOUNTER — Other Ambulatory Visit: Payer: Self-pay | Admitting: *Deleted

## 2018-11-10 ENCOUNTER — Other Ambulatory Visit: Payer: Self-pay

## 2018-11-10 ENCOUNTER — Ambulatory Visit
Admission: RE | Admit: 2018-11-10 | Discharge: 2018-11-10 | Disposition: A | Discharge status: Home / Self Care | Payer: Medicare Other | Source: Ambulatory Visit | Attending: Family Medicine | Admitting: Family Medicine

## 2018-11-10 DIAGNOSIS — F17210 Nicotine dependence, cigarettes, uncomplicated: Secondary | ICD-10-CM

## 2018-11-10 DIAGNOSIS — Z122 Encounter for screening for malignant neoplasm of respiratory organs: Secondary | ICD-10-CM

## 2018-11-10 DIAGNOSIS — Z1231 Encounter for screening mammogram for malignant neoplasm of breast: Secondary | ICD-10-CM

## 2018-11-10 DIAGNOSIS — Z87891 Personal history of nicotine dependence: Secondary | ICD-10-CM

## 2019-01-02 ENCOUNTER — Encounter: Payer: Self-pay | Admitting: Physician Assistant

## 2019-01-02 ENCOUNTER — Other Ambulatory Visit: Payer: Self-pay | Admitting: Family Medicine

## 2019-01-02 ENCOUNTER — Other Ambulatory Visit: Payer: Self-pay

## 2019-01-02 ENCOUNTER — Ambulatory Visit (INDEPENDENT_AMBULATORY_CARE_PROVIDER_SITE_OTHER): Payer: Medicare Other | Admitting: Physician Assistant

## 2019-01-02 ENCOUNTER — Ambulatory Visit: Payer: Self-pay | Admitting: *Deleted

## 2019-01-02 ENCOUNTER — Other Ambulatory Visit: Payer: Medicare Other

## 2019-01-02 VITALS — BP 121/98 | HR 84 | Ht 66.0 in | Wt 286.0 lb

## 2019-01-02 DIAGNOSIS — I10 Essential (primary) hypertension: Secondary | ICD-10-CM

## 2019-01-02 DIAGNOSIS — R300 Dysuria: Secondary | ICD-10-CM | POA: Diagnosis not present

## 2019-01-02 LAB — POCT URINALYSIS DIPSTICK
Bilirubin, UA: NEGATIVE
Blood, UA: NEGATIVE
Glucose, UA: NEGATIVE
Ketones, UA: NEGATIVE
Nitrite, UA: POSITIVE
Protein, UA: NEGATIVE
Spec Grav, UA: 1.02 (ref 1.010–1.025)
Urobilinogen, UA: 0.2 E.U./dL
pH, UA: 6 (ref 5.0–8.0)

## 2019-01-02 MED ORDER — CIPROFLOXACIN HCL 250 MG PO TABS: 250.0000 mg | ORAL_TABLET | Freq: Two times a day (BID) | ORAL | 0 refills | Status: AC

## 2019-01-02 NOTE — Progress Notes (Signed)
TELEPHONE ENCOUNTER   Patient verbally agreed to telephone visit and is aware that copayment and coinsurance may apply. Patient was treated using telemedicine according to accepted telemedicine protocols.  Location of the patient: home Location of provider: [place] of all persons participating in the telemedicine service and role in the encounter: Inda Coke, [place].  I acted as a Education administrator for [employer], PA-C Anselmo Pickler, LPN  Subjective:   Chief Complaint  Patient presents with  . Hypertension  . Dysuria     HPI   Hypertension Currently taking Maxzide-25, Hydralazine 25 mg TID. At home blood pressure readings are: >130/96-97. Patient denies chest pain, SOB, blurred vision, dizziness,, lower leg swelling. Patient is compliant with medication. Denies excessive caffeine intake, stimulant usage, excessive alcohol intake, or increase in salt consumption. Pt also c/o of headache for the past week, took ASA last night with relief. Blood pressure right now 121/98, HR-84.  Dysuria Pt c/o burning and pain with urination and frequency, low back pain x 1 week. Denies fever or chills. Hx of UTI's. Has not seen any blood in her urine. Has history of UTI. Denies hx of kidney infection or kidney stones. Denies n/v/chills.   Patient Active Problem List   Diagnosis Date Noted  . Morbid (severe) obesity due to excess calories ([employer]) 10/12/2018  . Cigarette smoker 10/12/2018  . Cough 10/11/2018  . Microcytic anemia 06/15/2018  . History of right mastectomy 06/15/2018  . Vitamin D deficiency 06/15/2018  . Emphysema of lung ([place]) 06/15/2018  . Nicotine dependence 06/15/2018  . Urine, incontinence, stress female 06/15/2018  . Estrogen deficiency 03/30/2016  . Rhinitis, allergic   . Insulin resistance   . HTN (hypertension) 09/24/2014  . Hyperlipidemia 09/24/2014  . GERD (gastroesophageal reflux disease) 09/24/2014  . OA (osteoarthritis) 09/24/2014  . History  of solitary pulmonary nodule 03/21/2008  . History of right breast cancer 03/21/2008   Social History   Tobacco Use  . Smoking status: Current Every Day Smoker    Packs/day: 0.75    Years: 47.00    Pack years: 35.25    Types: Cigarettes  . Smokeless tobacco: Never Used  . Tobacco comment: 09/24/2014 "I've quit smoking 3 times"  Substance Use Topics  . Alcohol use: No    Alcohol/week: 0.0 standard drinks    Current Outpatient Medications:  .  acetaminophen (TYLENOL) 650 MG CR tablet, Take 1,300 mg by mouth every 8 (eight) hours as needed for pain. Reported on 09/23/2015, Disp: , Rfl:  .  albuterol (PROVENTIL HFA;VENTOLIN HFA) 108 (90 Base) MCG/ACT inhaler, Inhale 2 puffs into the lungs every 6 (six) hours as needed for wheezing or shortness of breath., Disp: 1 Inhaler, Rfl: 3 .  Ferrous Sulfate (IRON PO), Take 1 tablet by mouth daily., Disp: , Rfl:  .  hydrALAZINE (APRESOLINE) 25 MG tablet, TAKE 1 TABLET BY MOUTH 3  TIMES DAILY, Disp: 270 tablet, Rfl: 2 .  Multiple Vitamins-Minerals (SENTRY ADULT) TABS, Take by mouth., Disp: , Rfl:  .  rosuvastatin (CRESTOR) 10 MG tablet, Take 1 tablet (10 mg total) by mouth at bedtime., Disp: 90 tablet, Rfl: 3 .  triamterene-hydrochlorothiazide (MAXZIDE-25) 37.5-25 MG tablet, Take 1 tablet by mouth daily., Disp: 90 tablet, Rfl: 1 .  ciprofloxacin (CIPRO) 250 MG tablet, Take 1 tablet (250 mg total) by mouth 2 (two) times daily for 5 days., Disp: 10 tablet, Rfl: 0 Allergies  Allergen Reactions  . Lisinopril Anaphylaxis  . Codeine  REACTION: MAKES HER SLEEPY   Results for orders placed or performed in visit on 01/02/19  POCT urinalysis dipstick  Result Value Ref Range   Color, UA Yellow    Clarity, UA [place]    Glucose, UA Negative Negative   Bilirubin, UA Negative    Ketones, UA Negative    Spec Grav, UA 1.020 1.010 - 1.025   Blood, UA Negative    pH, UA 6.0 5.0 - 8.0   Protein, UA Negative Negative   Urobilinogen, UA 0.2 0.2 or 1.0 E.U./dL    Nitrite, UA Positive    Leukocytes, UA Large (3+) (A) Negative   Appearance     Odor      Assessment & Plan:   1. Dysuria  UA and symptoms are consistent with acute cystitis.  Will treat with Cipro per orders.  Worsening precautions provided.  Recommended that she let us know she has no improvement in symptoms in 1 to 2 days.  Urine culture pending.  2. Essential hypertension  Will recheck BMP, she is able to come into the lab tomorrow for this.  Based on labs and her blood pressure tomorrow, we may consider changing her medications.     Orders Placed This Encounter  Procedures  . Urine Culture  . Basic metabolic panel  . POCT urinalysis dipstick   Meds ordered this encounter  Medications  . ciprofloxacin (CIPRO) 250 MG tablet    Sig: Take 1 tablet (250 mg total) by mouth 2 (two) times daily for 5 days.    Dispense:  10 tablet    Refill:  0    Order Specific Question:   Supervising Provider    Answer:   Juleen China, ERICA [place], PA 01/02/2019  Time spent with the patient: 8 minutes, spent in obtaining information about her symptoms, reviewing her previous labs, evaluations, and treatments, counseling her about her condition (please see the discussed topics above), and developing a plan to further investigate it; she had a number of questions which I addressed.

## 2019-01-02 NOTE — Telephone Encounter (Signed)
Pt reports BP trending up past week. High 130's / 96-97. States this AM 141/101- [id number]. Pt has home onitor, wrist cuff. 15- 20 minutes after taking BP meds. Reports headache past week, took ASA last night, effective,. Rates at 7/10 when occurs. Denies dizziness, no visual changes, no weakness. Also reports "I have a UTI." States H/O recurring UTIs. presently with burning, urgency, frequency.Onset 3 weeks ago.  Afebrile. Pt unable to do virtual appt .  CAll transferred to practice, [facility], for consideration of appt,  Pts CB# [phone] Reason for Disposition . Systolic BP  >= 0000000 OR Diastolic >= [id number]  Answer Assessment - Initial Assessment Questions 1. BLOOD PRESSURE: "What is the blood pressure?" "Did you take at least two measurements 5 minutes apart?"    141/101 at 0830   Took meds at 0830   During call at 0937  131/97 2. ONSET: "When did you take your blood pressure?"     This AM 3. HOW: "How did you obtain the blood pressure?" (e.g., visiting nurse, automatic home BP monitor)     Home monitor, wrist 4. HISTORY: "Do you have a history of high blood pressure?"     yes 5. MEDICATIONS: "Are you taking any medications for blood pressure?" "Have you missed any doses recently?"     none 6. OTHER SYMPTOMS: "Do you have any symptoms?" (e.g., headache, chest pain, blurred vision, difficulty breathing, weakness)    Headache 7/10, took ASA last night, effective.  Protocols used: HIGH BLOOD PRESSURE-A-AH

## 2019-01-02 NOTE — Telephone Encounter (Signed)
Triage note incomplete.  Please note:  BP checked during triage call which was one hour after taking meds; 131/97.

## 2019-01-02 NOTE — Telephone Encounter (Signed)
FYI pt appears to be scheduled for appointment today

## 2019-01-03 ENCOUNTER — Other Ambulatory Visit (INDEPENDENT_AMBULATORY_CARE_PROVIDER_SITE_OTHER): Payer: Medicare Other

## 2019-01-03 ENCOUNTER — Other Ambulatory Visit: Payer: Self-pay

## 2019-01-03 DIAGNOSIS — I10 Essential (primary) hypertension: Secondary | ICD-10-CM | POA: Diagnosis not present

## 2019-01-03 LAB — BASIC METABOLIC PANEL
BUN: 15 mg/dL (ref 6–23)
CO2: 30 mEq/L (ref 19–32)
Calcium: 9.3 mg/dL (ref 8.4–10.5)
Chloride: 101 mEq/L (ref 96–112)
Creatinine, Ser: 1.16 mg/dL (ref 0.40–1.20)
GFR: 56.03 mL/min — ABNORMAL LOW (ref >60.00)
Glucose, Bld: 95 mg/dL (ref 70–99)
Potassium: 3.6 mEq/L (ref 3.5–5.1)
Sodium: 140 mEq/L (ref 135–145)

## 2019-01-04 LAB — URINE CULTURE
MICRO NUMBER:: 877704
SPECIMEN QUALITY:: ADEQUATE

## 2019-01-06 ENCOUNTER — Other Ambulatory Visit: Payer: Self-pay | Admitting: *Deleted

## 2019-01-06 DIAGNOSIS — I10 Essential (primary) hypertension: Secondary | ICD-10-CM

## 2019-01-06 DIAGNOSIS — G4730 Sleep apnea, unspecified: Secondary | ICD-10-CM

## 2019-01-11 ENCOUNTER — Other Ambulatory Visit: Payer: Self-pay

## 2019-01-11 ENCOUNTER — Ambulatory Visit (INDEPENDENT_AMBULATORY_CARE_PROVIDER_SITE_OTHER): Payer: Medicare Other | Admitting: Cardiology

## 2019-01-11 ENCOUNTER — Encounter: Payer: Self-pay | Admitting: Cardiology

## 2019-01-11 ENCOUNTER — Other Ambulatory Visit (HOSPITAL_COMMUNITY)
Admission: RE | Admit: 2019-01-11 | Discharge: 2019-01-11 | Disposition: A | Discharge status: Home / Self Care | Payer: Medicare Other | Source: Ambulatory Visit | Attending: Internal Medicine | Admitting: Internal Medicine

## 2019-01-11 VITALS — BP 120/74 | HR 55 | Ht 66.0 in | Wt 288.0 lb

## 2019-01-11 DIAGNOSIS — I10 Essential (primary) hypertension: Secondary | ICD-10-CM

## 2019-01-11 DIAGNOSIS — Z20828 Contact with and (suspected) exposure to other viral communicable diseases: Secondary | ICD-10-CM | POA: Insufficient documentation

## 2019-01-11 DIAGNOSIS — Z720 Tobacco use: Secondary | ICD-10-CM

## 2019-01-11 DIAGNOSIS — I2510 Atherosclerotic heart disease of native coronary artery without angina pectoris: Secondary | ICD-10-CM | POA: Diagnosis not present

## 2019-01-11 DIAGNOSIS — Z01812 Encounter for preprocedural laboratory examination: Secondary | ICD-10-CM | POA: Diagnosis present

## 2019-01-11 LAB — CBC WITH DIFFERENTIAL/PLATELET
Basophils Absolute: 0.1 x10E3/uL (ref 0.0–0.2)
Basos: 1 %
EOS (ABSOLUTE): 0.8 x10E3/uL — ABNORMAL HIGH (ref 0.0–0.4)
Eos: 7 %
Hematocrit: 41 % (ref 34.0–46.6)
Hemoglobin: 12.6 g/dL (ref 11.1–15.9)
Immature Grans (Abs): 0 x10E3/uL (ref 0.0–0.1)
Immature Granulocytes: 0 %
Lymphocytes Absolute: 5.7 x10E3/uL — ABNORMAL HIGH (ref 0.7–3.1)
Lymphs: 54 %
MCH: 22.2 pg — ABNORMAL LOW (ref 26.6–33.0)
MCHC: 30.7 g/dL — ABNORMAL LOW (ref 31.5–35.7)
MCV: 72 fL — ABNORMAL LOW (ref 79–97)
Monocytes Absolute: 0.8 x10E3/uL (ref 0.1–0.9)
Monocytes: 7 %
Neutrophils Absolute: 3.3 x10E3/uL (ref 1.4–7.0)
Neutrophils: 31 %
Platelets: 284 x10E3/uL (ref 150–450)
RBC: 5.68 x10E6/uL — ABNORMAL HIGH (ref 3.77–5.28)
RDW: 15.6 % — ABNORMAL HIGH (ref 11.7–15.4)
WBC: 10.8 x10E3/uL (ref 3.4–10.8)

## 2019-01-11 LAB — SARS CORONAVIRUS 2 (TAT 6-24 HRS): SARS Coronavirus 2: NEGATIVE

## 2019-01-11 MED ORDER — ASPIRIN EC 81 MG PO TBEC: 81.0000 mg | DELAYED_RELEASE_TABLET | Freq: Every day | ORAL | 3 refills | Status: DC

## 2019-01-11 NOTE — Progress Notes (Signed)
Cardiology Office Note:    Date:  01/11/2019   ID:  [name] Cooper, DOB Oct 02, 1950, MRN [id number]  PCP:  Briscoe Deutscher, DO  Cardiologist:  No primary care provider on file.  Electrophysiologist:  None   Referring MD: Briscoe Deutscher, DO     History of Present Illness:    [name] Cooper is a 68 y.o. female with tobacco use, dyspnea with CT scan lung screening with the following:  IMPRESSION: 1. Lung-RADS 2, benign appearance or behavior. Continue annual screening with low-dose chest CT without contrast in 12 months. 2. Coronary artery atherosclerosis. Personally viewed, dense calcification LAD and CIRC, mild mid to distal RCA.  LDL 159  EKG 01/11/2019 shows sinus bradycardia 55 and T wave inversions noted in V2 V3 and V4 concerning for ischemia.  She has been experiencing dyspnea, dyspnea on exertion with minimal physical activity.  No significant chest tightness.  No syncope no bleeding no orthopnea no PND.   Blood pressure has been erratic but currently is under good control.  I previously seen her for palpitations in 2018 but she is doing well with this.  No early FHX of CAD.  Tob at 68 year old.   Past Medical History:  Diagnosis Date  . Cancer ([place])   . Chicken pox   . Chronic lower back pain   . Diabetes ([place])   . GERD (gastroesophageal reflux disease)   . Hepatitis 1970's  . History of recurrent UTIs   . Hyperlipemia   . Slow heart rate dx'd 09/24/2014  . Urine incontinence     Past Surgical History:  Procedure Laterality Date  . ABDOMINAL HYSTERECTOMY  1984  . APPENDECTOMY    . BREAST BIOPSY Right 1996  . BREAST LUMPECTOMY Right 1996  . MASTECTOMY Right 1996  . TONSILLECTOMY      Current Medications: Current Meds  Medication Sig  . acetaminophen (TYLENOL) 650 MG CR tablet Take 1,300 mg by mouth every 8 (eight) hours as needed for pain. Reported on 09/23/2015  . albuterol (PROVENTIL HFA;VENTOLIN HFA) 108 (90 Base) MCG/ACT inhaler Inhale 2 puffs  into the lungs every 6 (six) hours as needed for wheezing or shortness of breath.  . Ferrous Sulfate (IRON PO) Take 1 tablet by mouth daily.  . hydrALAZINE (APRESOLINE) 25 MG tablet TAKE 1 TABLET BY MOUTH 3  TIMES DAILY  . Multiple Vitamins-Minerals (SENTRY ADULT) TABS Take by mouth.  . rosuvastatin (CRESTOR) 10 MG tablet Take 1 tablet (10 mg total) by mouth at bedtime.  . triamterene-hydrochlorothiazide (MAXZIDE-25) 37.5-25 MG tablet Take 1 tablet by mouth daily.     Allergies:   Lisinopril and Codeine   Social History   Socioeconomic History  . Marital status: Widowed    Spouse name: Not on file  . Number of children: Not on file  . Years of education: Not on file  . Highest education level: Not on file  Occupational History    Employer: [employer]  Social Needs  . Financial resource strain: Not on file  . Food insecurity    Worry: Not on file    Inability: Not on file  . Transportation needs    Medical: Not on file    Non-medical: Not on file  Tobacco Use  . Smoking status: Current Every Day Smoker    Packs/day: 0.75    Years: 47.00    Pack years: 35.25    Types: Cigarettes  . Smokeless tobacco: Never Used  . Tobacco comment: 09/24/2014 "I've quit smoking 3  times"  Substance and Sexual Activity  . Alcohol use: No    Alcohol/week: 0.0 standard drinks  . Drug use: No    Types: "Crack" cocaine    Comment: 09/24/2014 "last crack was in 2013"  . Sexual activity: Not on file  Lifestyle  . Physical activity    Days per week: Not on file    Minutes per session: Not on file  . Stress: Not on file  Relationships  . Social Herbalist on phone: Not on file    Gets together: Not on file    Attends religious service: Not on file    Active member of club or organization: Not on file    Attends meetings of clubs or organizations: Not on file    Relationship status: Not on file  Other Topics Concern  . Not on file  Social History Narrative  . Not on file      Family History: The patient's family history includes Arthritis in her brother; Cancer in her mother; Early death in her mother. There is no history of Colon cancer.  ROS:   Please see the history of present illness.    No fevers chills nausea vomiting syncope bleeding all other systems reviewed and are negative.  EKGs/Labs/Other Studies Reviewed:    The following studies were reviewed today: Coronary calcium noted on CT scan personally reviewed  EKG:  EKG is  ordered today.  The ekg ordered today demonstrates sinus bradycardia 55 with T wave inversion noted in the anterior leads concerning for ischemia  Recent Labs: 06/07/2018: ALT 9; TSH 2.22 09/19/2018: Hemoglobin 12.6; Platelets 299.0 01/03/2019: BUN 15; Creatinine, Ser 1.16; Potassium 3.6; Sodium 140  Recent Lipid Panel    Component Value Date/Time   CHOL 242 (H) 06/07/2018 1446   CHOL 177 07/19/2017 1100   TRIG 311.0 (H) 06/07/2018 1446   HDL 48.60 06/07/2018 1446   HDL 68 07/19/2017 1100   CHOLHDL 5 06/07/2018 1446   VLDL 62.2 (H) 06/07/2018 1446   LDLCALC 94 07/19/2017 1100   LDLDIRECT 159.0 06/07/2018 1446    Physical Exam:    VS:  BP 120/74   Pulse (!) 55   Ht 5\' 6"  (1.676 m)   Wt 288 lb (130.6 kg)   LMP  (LMP Unknown)   SpO2 98%   BMI 46.48 kg/m     Wt Readings from Last 3 Encounters:  01/11/19 288 lb (130.6 kg)  01/02/19 286 lb (129.7 kg)  11/07/18 283 lb 12.8 oz (128.7 kg)     GEN: Obese well nourished, well developed in no acute distress HEENT: Normal NECK: No JVD; No carotid bruits LYMPHATICS: No lymphadenopathy CARDIAC: RRR, no murmurs, rubs, gallops RESPIRATORY: Wheezes heard bilaterally ABDOMEN: Soft, non-tender, non-distended MUSCULOSKELETAL:  No edema; No deformity  SKIN: Warm and dry NEUROLOGIC:  Alert and oriented x 3 PSYCHIATRIC:  Normal affect   ASSESSMENT:    1. Atherosclerosis of native coronary artery of native heart without angina pectoris   2. Morbid obesity ([place])   3.  Tobacco use   4. Essential hypertension    PLAN:    In order of problems listed above:  Dense coronary artery calcification  - LAD, Circ.  Scan personally reviewed.  -Because of the dense coronary artery calcification and multi-vessels as well as the T wave inversions on EKG anteriorly which are concerning for difficulties in blood flow especially in the LAD territory and her ongoing challenges with dyspnea on exertion which may be  an anginal equivalent, I would like to proceed with cardiac catheterization for definitive diagnosis.  Risks and benefits have been explained including stroke heart attack death renal impairment bleeding.  She is willing to proceed.  She will get COVID-19 tested.  - Crestor 10 has been started. 09/19/2018 by Dr. Juleen China.  -Started aspirin 81 mg   - Continue with HTN control.   -Continuing to use inhaler, Dr.Wert's note from 10/11/18 reviewed.   Morbid obesity -Continue to encourage weight loss.  Decrease carbohydrates.  Essential hypertension -Continue with optimal blood pressure control.  Tobacco use -Continue to encourage tobacco cessation.      Medication Adjustments/Labs and Tests Ordered: Current medicines are reviewed at length with the patient today.  Concerns regarding medicines are outlined above.  Orders Placed This Encounter  Procedures  . CBC with Differential/Platelet  . EKG 12-Lead   Meds ordered this encounter  Medications  . aspirin EC 81 MG tablet    Sig: Take 1 tablet (81 mg total) by mouth daily.    Dispense:  90 tablet    Refill:  3    Patient Instructions  Medication Instructions:  1) START ASPIRIN 81 mg daily  Lab work: TODAY  Testing/Procedures: Your physician has requested that you have a cardiac catheterization. Cardiac catheterization is used to diagnose and/or treat various heart conditions. Doctors may recommend this procedure for a number of different reasons. The most common reason is to evaluate chest pain.  Chest pain can be a symptom of coronary artery disease (CAD), and cardiac catheterization can show whether plaque is narrowing or blocking your heart's arteries. This procedure is also used to evaluate the valves, as well as measure the blood flow and oxygen levels in different parts of your heart. For further information please visit HugeFiesta.[email or web address]. Please follow instruction sheet, as given.  Follow-Up: Your provider recommends that you schedule a follow-up appointment with Dr. Marlou Porch' assistant 3 weeks post cath.   CATHETERIZATION INSTRUCTIONS:  You are scheduled for a Cardiac Catheterization on Friday, September 25 with Dr. Harrell Gave End.  1. Please arrive at the [facility] (Main Entrance A) at [facility]: [place], [place] [address] at 10:00 AM (This time is two hours before your procedure to ensure your preparation). You are allowed one visitor in the hospital. Both you and your visitor must wear masks. Free valet parking service is available. Special note: Every effort is made to have your procedure done on time. Please understand that emergencies sometimes delay scheduled procedures.  2. Diet: Do not eat solid foods after midnight.  You may have clear liquids until 5am upon the day of the procedure.  3. Labs: TODAY! After you leave the office, please proceed to [place] site for your COVID screening. [place] Stay in the RIGHT lane Tell them you are there for pre-procedure testing!  4. Medication instructions in preparation for your procedure:  1) HOLD MAXZIDE the morning of your procedure  2) HOLD HYDRALAZINE the morning of your procedure  3) TAKE ASPIRIN 81 mg the morning of your procedure  4) You may take other medications as directed with sips of water.  5. Plan for one night stay--bring personal belongings. 6. Bring a current list of your medications and current insurance cards. 7. You MUST have a responsible person to  drive you home. 8. Someone MUST be with you the first 24 hours after you arrive home or your discharge will be delayed. 9. Please wear  clothes that are easy to get on and off and wear slip-on shoes.  Thank you for allowing [place] to care for you!   -- [facility] Invasive Cardiovascular services     Signed, Candee Furbish, MD  01/11/2019 9:43 AM    [place]

## 2019-01-11 NOTE — H&P (View-Only) (Signed)
Cardiology Office Note:    Date:  01/11/2019   ID:  [name] Cooper, DOB Dec 04, 1950, MRN [id number]  PCP:  [name] Deutscher, DO  Cardiologist:  No primary care provider on file.  Electrophysiologist:  None   Referring MD: [name] Deutscher, DO     History of Present Illness:    [name] Cooper is a 68 y.o. female with tobacco use, dyspnea with CT scan lung screening with the following:  IMPRESSION: 1. Lung-RADS 2, benign appearance or behavior. Continue annual screening with low-dose chest CT without contrast in 12 months. 2. Coronary artery atherosclerosis. Personally viewed, dense calcification LAD and CIRC, mild mid to distal RCA.  LDL 159  EKG 01/11/2019 shows sinus bradycardia 55 and T wave inversions noted in V2 V3 and V4 concerning for ischemia.  She has been experiencing dyspnea, dyspnea on exertion with minimal physical activity.  No significant chest tightness.  No syncope no bleeding no orthopnea no PND.   Blood pressure has been erratic but currently is under good control.  I previously seen her for palpitations in 2018 but she is doing well with this.  No early FHX of CAD.  Tob at 68 year old.   Past Medical History:  Diagnosis Date  . Cancer ([place])   . Chicken pox   . Chronic lower back pain   . Diabetes ([place])   . GERD (gastroesophageal reflux disease)   . Hepatitis 1970's  . History of recurrent UTIs   . Hyperlipemia   . Slow heart rate dx'd 09/24/2014  . Urine incontinence     Past Surgical History:  Procedure Laterality Date  . ABDOMINAL HYSTERECTOMY  1984  . APPENDECTOMY    . BREAST BIOPSY Right 1996  . BREAST LUMPECTOMY Right 1996  . MASTECTOMY Right 1996  . TONSILLECTOMY      Current Medications: Current Meds  Medication Sig  . acetaminophen (TYLENOL) 650 MG CR tablet Take 1,300 mg by mouth every 8 (eight) hours as needed for pain. Reported on 09/23/2015  . albuterol (PROVENTIL HFA;VENTOLIN HFA) 108 (90 Base) MCG/ACT inhaler Inhale 2 puffs  into the lungs every 6 (six) hours as needed for wheezing or shortness of breath.  . Ferrous Sulfate (IRON PO) Take 1 tablet by mouth daily.  . hydrALAZINE (APRESOLINE) 25 MG tablet TAKE 1 TABLET BY MOUTH 3  TIMES DAILY  . Multiple Vitamins-Minerals (SENTRY ADULT) TABS Take by mouth.  . rosuvastatin (CRESTOR) 10 MG tablet Take 1 tablet (10 mg total) by mouth at bedtime.  . triamterene-hydrochlorothiazide (MAXZIDE-25) 37.5-25 MG tablet Take 1 tablet by mouth daily.     Allergies:   Lisinopril and Codeine   Social History   Socioeconomic History  . Marital status: Widowed    Spouse name: Not on file  . Number of children: Not on file  . Years of education: Not on file  . Highest education level: Not on file  Occupational History    Employer: [employer]  Social Needs  . Financial resource strain: Not on file  . Food insecurity    Worry: Not on file    Inability: Not on file  . Transportation needs    Medical: Not on file    Non-medical: Not on file  Tobacco Use  . Smoking status: Current Every Day Smoker    Packs/day: 0.75    Years: 47.00    Pack years: 35.25    Types: Cigarettes  . Smokeless tobacco: Never Used  . Tobacco comment: 09/24/2014 "I've quit smoking 3  times"  Substance and Sexual Activity  . Alcohol use: No    Alcohol/week: 0.0 standard drinks  . Drug use: No    Types: "Crack" cocaine    Comment: 09/24/2014 "last crack was in 2013"  . Sexual activity: Not on file  Lifestyle  . Physical activity    Days per week: Not on file    Minutes per session: Not on file  . Stress: Not on file  Relationships  . Social Herbalist on phone: Not on file    Gets together: Not on file    Attends religious service: Not on file    Active member of club or organization: Not on file    Attends meetings of clubs or organizations: Not on file    Relationship status: Not on file  Other Topics Concern  . Not on file  Social History Narrative  . Not on file      Family History: The patient's family history includes Arthritis in her brother; Cancer in her mother; Early death in her mother. There is no history of Colon cancer.  ROS:   Please see the history of present illness.    No fevers chills nausea vomiting syncope bleeding all other systems reviewed and are negative.  EKGs/Labs/Other Studies Reviewed:    The following studies were reviewed today: Coronary calcium noted on CT scan personally reviewed  EKG:  EKG is  ordered today.  The ekg ordered today demonstrates sinus bradycardia 55 with T wave inversion noted in the anterior leads concerning for ischemia  Recent Labs: 06/07/2018: ALT 9; TSH 2.22 09/19/2018: Hemoglobin 12.6; Platelets 299.0 01/03/2019: BUN 15; Creatinine, Ser 1.16; Potassium 3.6; Sodium 140  Recent Lipid Panel    Component Value Date/Time   CHOL 242 (H) 06/07/2018 1446   CHOL 177 07/19/2017 1100   TRIG 311.0 (H) 06/07/2018 1446   HDL 48.60 06/07/2018 1446   HDL 68 07/19/2017 1100   CHOLHDL 5 06/07/2018 1446   VLDL 62.2 (H) 06/07/2018 1446   LDLCALC 94 07/19/2017 1100   LDLDIRECT 159.0 06/07/2018 1446    Physical Exam:    VS:  BP 120/74   Pulse (!) 55   Ht 5\' 6"  (1.676 m)   Wt 288 lb (130.6 kg)   LMP  (LMP Unknown)   SpO2 98%   BMI 46.48 kg/m     Wt Readings from Last 3 Encounters:  01/11/19 288 lb (130.6 kg)  01/02/19 286 lb (129.7 kg)  11/07/18 283 lb 12.8 oz (128.7 kg)     GEN: Obese well nourished, well developed in no acute distress HEENT: Normal NECK: No JVD; No carotid bruits LYMPHATICS: No lymphadenopathy CARDIAC: RRR, no murmurs, rubs, gallops RESPIRATORY: Wheezes heard bilaterally ABDOMEN: Soft, non-tender, non-distended MUSCULOSKELETAL:  No edema; No deformity  SKIN: Warm and dry NEUROLOGIC:  Alert and oriented x 3 PSYCHIATRIC:  Normal affect   ASSESSMENT:    1. Atherosclerosis of native coronary artery of native heart without angina pectoris   2. Morbid obesity ([place])   3.  Tobacco use   4. Essential hypertension    PLAN:    In order of problems listed above:  Dense coronary artery calcification  - LAD, Circ.  Scan personally reviewed.  -Because of the dense coronary artery calcification and multi-vessels as well as the T wave inversions on EKG anteriorly which are concerning for difficulties in blood flow especially in the LAD territory and her ongoing challenges with dyspnea on exertion which may be  an anginal equivalent, I would like to proceed with cardiac catheterization for definitive diagnosis.  Risks and benefits have been explained including stroke heart attack death renal impairment bleeding.  She is willing to proceed.  She will get COVID-19 tested.  - Crestor 10 has been started. 09/19/2018 by Dr. Juleen China.  -Started aspirin 81 mg   - Continue with HTN control.   -Continuing to use inhaler, Dr.Wert's note from 10/11/18 reviewed.   Morbid obesity -Continue to encourage weight loss.  Decrease carbohydrates.  Essential hypertension -Continue with optimal blood pressure control.  Tobacco use -Continue to encourage tobacco cessation.      Medication Adjustments/Labs and Tests Ordered: Current medicines are reviewed at length with the patient today.  Concerns regarding medicines are outlined above.  Orders Placed This Encounter  Procedures  . CBC with Differential/Platelet  . EKG 12-Lead   Meds ordered this encounter  Medications  . aspirin EC 81 MG tablet    Sig: Take 1 tablet (81 mg total) by mouth daily.    Dispense:  90 tablet    Refill:  3    Patient Instructions  Medication Instructions:  1) START ASPIRIN 81 mg daily  Lab work: TODAY  Testing/Procedures: Your physician has requested that you have a cardiac catheterization. Cardiac catheterization is used to diagnose and/or treat various heart conditions. Doctors may recommend this procedure for a number of different reasons. The most common reason is to evaluate chest pain.  Chest pain can be a symptom of coronary artery disease (CAD), and cardiac catheterization can show whether plaque is narrowing or blocking your heart's arteries. This procedure is also used to evaluate the valves, as well as measure the blood flow and oxygen levels in different parts of your heart. For further information please visit HugeFiesta.[email or web address]. Please follow instruction sheet, as given.  Follow-Up: Your provider recommends that you schedule a follow-up appointment with Dr. Marlou Porch' assistant 3 weeks post cath.   CATHETERIZATION INSTRUCTIONS:  You are scheduled for a Cardiac Catheterization on Friday, September 25 with Dr. Harrell Gave End.  1. Please arrive at the [facility] (Main Entrance A) at [facility]: [place], [place] [address] at 10:00 AM (This time is two hours before your procedure to ensure your preparation). You are allowed one visitor in the hospital. Both you and your visitor must wear masks. Free valet parking service is available. Special note: Every effort is made to have your procedure done on time. Please understand that emergencies sometimes delay scheduled procedures.  2. Diet: Do not eat solid foods after midnight.  You may have clear liquids until 5am upon the day of the procedure.  3. Labs: TODAY! After you leave the office, please proceed to [place] site for your COVID screening. [place] Stay in the RIGHT lane Tell them you are there for pre-procedure testing!  4. Medication instructions in preparation for your procedure:  1) HOLD MAXZIDE the morning of your procedure  2) HOLD HYDRALAZINE the morning of your procedure  3) TAKE ASPIRIN 81 mg the morning of your procedure  4) You may take other medications as directed with sips of water.  5. Plan for one night stay--bring personal belongings. 6. Bring a current list of your medications and current insurance cards. 7. You MUST have a responsible person to  drive you home. 8. Someone MUST be with you the first 24 hours after you arrive home or your discharge will be delayed. 9. Please wear  clothes that are easy to get on and off and wear slip-on shoes.  Thank you for allowing [place] to care for you!   -- [facility] Invasive Cardiovascular services     Signed, Candee Furbish, MD  01/11/2019 9:43 AM    [place]

## 2019-01-11 NOTE — Patient Instructions (Addendum)
Medication Instructions:  1) START ASPIRIN 81 mg daily  Lab work: TODAY  Testing/Procedures: Your physician has requested that you have a cardiac catheterization. Cardiac catheterization is used to diagnose and/or treat various heart conditions. Doctors may recommend this procedure for a number of different reasons. The most common reason is to evaluate chest pain. Chest pain can be a symptom of coronary artery disease (CAD), and cardiac catheterization can show whether plaque is narrowing or blocking your heart's arteries. This procedure is also used to evaluate the valves, as well as measure the blood flow and oxygen levels in different parts of your heart. For further information please visit HugeFiesta.[email or web address]. Please follow instruction sheet, as given.  Follow-Up: Your provider recommends that you schedule a follow-up appointment with Dr. Marlou Porch' assistant 3 weeks post cath.   CATHETERIZATION INSTRUCTIONS:  You are scheduled for a Cardiac Catheterization on Friday, September 25 with Dr. Harrell Gave End.  1. Please arrive at the [facility] (Main Entrance A) at [facility]: [place], [place] [address] at 10:00 AM (This time is two hours before your procedure to ensure your preparation). You are allowed one visitor in the hospital. Both you and your visitor must wear masks. Free valet parking service is available. Special note: Every effort is made to have your procedure done on time. Please understand that emergencies sometimes delay scheduled procedures.  2. Diet: Do not eat solid foods after midnight.  You may have clear liquids until 5am upon the day of the procedure.  3. Labs: TODAY! After you leave the office, please proceed to [place] site for your COVID screening. [place] Stay in the RIGHT lane Tell them you are there for pre-procedure testing!  4. Medication instructions in preparation for your procedure:  1) HOLD MAXZIDE the  morning of your procedure  2) HOLD HYDRALAZINE the morning of your procedure  3) TAKE ASPIRIN 81 mg the morning of your procedure  4) You may take other medications as directed with sips of water.  5. Plan for one night stay--bring personal belongings. 6. Bring a current list of your medications and current insurance cards. 7. You MUST have a responsible person to drive you home. 8. Someone MUST be with you the first 24 hours after you arrive home or your discharge will be delayed. 9. Please wear clothes that are easy to get on and off and wear slip-on shoes.  Thank you for allowing [place] to care for you!   -- [place] Invasive Cardiovascular services

## 2019-01-12 ENCOUNTER — Telehealth: Payer: Self-pay | Admitting: *Deleted

## 2019-01-12 ENCOUNTER — Telehealth: Payer: Self-pay

## 2019-01-12 NOTE — Telephone Encounter (Signed)
Notes recorded by Frederik Schmidt, RN on 01/12/2019 at 1:30 PM EDT  The patient has been notified of the result and verbalized understanding. All questions (if any) were answered.  Frederik Schmidt, RN 01/12/2019 1:30 PM   Patient confirmed Iron Supplementation.

## 2019-01-12 NOTE — Telephone Encounter (Signed)
-----   Message from Jerline Pain, MD sent at 01/12/2019 10:52 AM EDT ----- Microcytic anemia, iron deficiency anemia.  She is on iron supplementation per Dr. Juleen China. Candee Furbish, MD

## 2019-01-12 NOTE — Telephone Encounter (Signed)
Pt contacted pre-catheterization scheduled at [facility] for: Verified arrival time and place: [facility]) at:   No solid food after midnight prior to cath, clear liquids until 5 AM day of procedure. Contrast allergy: no   Hold: Triamterene-HCT-day before and day of procedure-GFR 56  Except hold medications AM meds can be  taken pre-cath with sip of water including: ASA 81 mg   Confirmed patient has responsible person to drive home post procedure and observe 24 hours after arriving home: yes  Currently, due to Covid-19 pandemic, only one support person will be allowed with patient. Must be the same support person for that patient's entire stay, will be screened and required to wear a mask. They will be asked to wait in the waiting room for the duration of the patient's stay.  Patients are required to wear a mask when they enter the hospital.     COVID-19 Pre-Screening Questions:  . In the past 7 to 10 days have you had a cough,  shortness of breath, headache, congestion, fever (100 or greater) body aches, chills, sore throat, or sudden loss of taste or sense of smell? no . Have you been around anyone with known Covid 19? no . Have you been around anyone who is awaiting Covid 19 test results in the past 7 to 10 days? no . Have you been around anyone who has been exposed to Covid 19, or has mentioned symptoms of Covid 19 within the past 7 to 10 days? no   I reviewed procedure/mask/visitor, Covid-19 screening questions with patient, she verbalized understanding.

## 2019-01-13 ENCOUNTER — Ambulatory Visit (HOSPITAL_COMMUNITY)
Admission: RE | Admit: 2019-01-13 | Discharge: 2019-01-13 | Disposition: A | Discharge status: Home / Self Care | Payer: Medicare Other | Attending: Internal Medicine | Admitting: Internal Medicine

## 2019-01-13 ENCOUNTER — Other Ambulatory Visit: Payer: Self-pay

## 2019-01-13 ENCOUNTER — Encounter (HOSPITAL_COMMUNITY)
Admission: RE | Disposition: A | Discharge status: Home / Self Care | Payer: Medicare Other | Source: Home / Self Care | Attending: Internal Medicine

## 2019-01-13 ENCOUNTER — Encounter (HOSPITAL_COMMUNITY): Payer: Self-pay | Admitting: Internal Medicine

## 2019-01-13 DIAGNOSIS — R0609 Other forms of dyspnea: Secondary | ICD-10-CM | POA: Diagnosis present

## 2019-01-13 DIAGNOSIS — Z79899 Other long term (current) drug therapy: Secondary | ICD-10-CM | POA: Diagnosis not present

## 2019-01-13 DIAGNOSIS — K219 Gastro-esophageal reflux disease without esophagitis: Secondary | ICD-10-CM | POA: Diagnosis not present

## 2019-01-13 DIAGNOSIS — Z90710 Acquired absence of both cervix and uterus: Secondary | ICD-10-CM | POA: Insufficient documentation

## 2019-01-13 DIAGNOSIS — F17210 Nicotine dependence, cigarettes, uncomplicated: Secondary | ICD-10-CM | POA: Diagnosis not present

## 2019-01-13 DIAGNOSIS — E119 Type 2 diabetes mellitus without complications: Secondary | ICD-10-CM | POA: Insufficient documentation

## 2019-01-13 DIAGNOSIS — I10 Essential (primary) hypertension: Secondary | ICD-10-CM | POA: Diagnosis not present

## 2019-01-13 DIAGNOSIS — Z9011 Acquired absence of right breast and nipple: Secondary | ICD-10-CM | POA: Insufficient documentation

## 2019-01-13 DIAGNOSIS — I2510 Atherosclerotic heart disease of native coronary artery without angina pectoris: Secondary | ICD-10-CM | POA: Insufficient documentation

## 2019-01-13 DIAGNOSIS — Z885 Allergy status to narcotic agent status: Secondary | ICD-10-CM | POA: Insufficient documentation

## 2019-01-13 DIAGNOSIS — E785 Hyperlipidemia, unspecified: Secondary | ICD-10-CM | POA: Insufficient documentation

## 2019-01-13 DIAGNOSIS — Z6841 Body Mass Index (BMI) 40.0 and over, adult: Secondary | ICD-10-CM | POA: Insufficient documentation

## 2019-01-13 DIAGNOSIS — I2584 Coronary atherosclerosis due to calcified coronary lesion: Secondary | ICD-10-CM

## 2019-01-13 DIAGNOSIS — Z888 Allergy status to other drugs, medicaments and biological substances status: Secondary | ICD-10-CM | POA: Diagnosis not present

## 2019-01-13 HISTORY — PX: LEFT HEART CATH AND CORONARY ANGIOGRAPHY: CATH118249

## 2019-01-13 SURGERY — LEFT HEART CATH AND CORONARY ANGIOGRAPHY
Anesthesia: LOCAL

## 2019-01-13 MED ORDER — SODIUM CHLORIDE 0.9 % IV SOLN: INTRAVENOUS | Status: DC

## 2019-01-13 MED ORDER — SODIUM CHLORIDE 0.9% FLUSH: 3.0000 mL | INTRAVENOUS | Status: DC | PRN

## 2019-01-13 MED ORDER — LIDOCAINE HCL (PF) 1 % IJ SOLN
INTRAMUSCULAR | Status: DC | PRN
  Administered 2019-01-13: 2 mL

## 2019-01-13 MED ORDER — SODIUM CHLORIDE 0.9% FLUSH: 3.0000 mL | Freq: Two times a day (BID) | INTRAVENOUS | Status: DC

## 2019-01-13 MED ORDER — ONDANSETRON HCL 4 MG/2ML IJ SOLN: 4.0000 mg | Freq: Four times a day (QID) | INTRAMUSCULAR | Status: DC | PRN

## 2019-01-13 MED ORDER — HEPARIN (PORCINE) IN NACL 1000-0.9 UT/500ML-% IV SOLN
INTRAVENOUS | Status: DC | PRN
  Administered 2019-01-13 (×2): 500 mL

## 2019-01-13 MED ORDER — LIDOCAINE HCL (PF) 1 % IJ SOLN
INTRAMUSCULAR | Status: AC
  Filled 2019-01-13: qty 30

## 2019-01-13 MED ORDER — SODIUM CHLORIDE 0.9 % IV SOLN: 250.0000 mL | INTRAVENOUS | Status: DC | PRN

## 2019-01-13 MED ORDER — VERAPAMIL HCL 2.5 MG/ML IV SOLN
INTRAVENOUS | Status: DC | PRN
  Administered 2019-01-13: 11:00:00 10 mL via INTRA_ARTERIAL

## 2019-01-13 MED ORDER — FENTANYL CITRATE (PF) 100 MCG/2ML IJ SOLN
INTRAMUSCULAR | Status: DC | PRN
  Administered 2019-01-13: 25 ug via INTRAVENOUS

## 2019-01-13 MED ORDER — ISOSORBIDE MONONITRATE ER 30 MG PO TB24: 30.0000 mg | ORAL_TABLET | Freq: Every day | ORAL | 11 refills | Status: DC

## 2019-01-13 MED ORDER — ACETAMINOPHEN 325 MG PO TABS: 650.0000 mg | ORAL_TABLET | ORAL | Status: DC | PRN

## 2019-01-13 MED ORDER — HEPARIN (PORCINE) IN NACL 1000-0.9 UT/500ML-% IV SOLN
INTRAVENOUS | Status: AC
  Filled 2019-01-13: qty 500

## 2019-01-13 MED ORDER — MIDAZOLAM HCL 2 MG/2ML IJ SOLN
INTRAMUSCULAR | Status: AC
  Filled 2019-01-13: qty 2

## 2019-01-13 MED ORDER — SODIUM CHLORIDE 0.9 % WEIGHT BASED INFUSION
3.0000 mL/kg/h | INTRAVENOUS | Status: AC
  Administered 2019-01-13: 3 mL/kg/h via INTRAVENOUS

## 2019-01-13 MED ORDER — IOHEXOL 350 MG/ML SOLN
INTRAVENOUS | Status: DC | PRN
  Administered 2019-01-13: 12:00:00 60 mL

## 2019-01-13 MED ORDER — FENTANYL CITRATE (PF) 100 MCG/2ML IJ SOLN
INTRAMUSCULAR | Status: AC
  Filled 2019-01-13: qty 2

## 2019-01-13 MED ORDER — HEPARIN SODIUM (PORCINE) 1000 UNIT/ML IJ SOLN
INTRAMUSCULAR | Status: AC
  Filled 2019-01-13: qty 1

## 2019-01-13 MED ORDER — HEPARIN SODIUM (PORCINE) 1000 UNIT/ML IJ SOLN
INTRAMUSCULAR | Status: DC | PRN
  Administered 2019-01-13: 5000 [IU] via INTRAVENOUS

## 2019-01-13 MED ORDER — MIDAZOLAM HCL 2 MG/2ML IJ SOLN
INTRAMUSCULAR | Status: DC | PRN
  Administered 2019-01-13: 1 mg via INTRAVENOUS

## 2019-01-13 MED ORDER — SODIUM CHLORIDE 0.9 % WEIGHT BASED INFUSION: 1.0000 mL/kg/h | INTRAVENOUS | Status: DC

## 2019-01-13 MED ORDER — LABETALOL HCL 5 MG/ML IV SOLN: 10.0000 mg | INTRAVENOUS | Status: DC | PRN

## 2019-01-13 MED ORDER — NITROGLYCERIN 0.4 MG SL SUBL: 0.4000 mg | SUBLINGUAL_TABLET | SUBLINGUAL | 99 refills | Status: DC | PRN

## 2019-01-13 MED ORDER — ASPIRIN 81 MG PO CHEW: 81.0000 mg | CHEWABLE_TABLET | ORAL | Status: DC

## 2019-01-13 MED ORDER — VERAPAMIL HCL 2.5 MG/ML IV SOLN
INTRAVENOUS | Status: AC
  Filled 2019-01-13: qty 2

## 2019-01-13 MED ORDER — HYDRALAZINE HCL 20 MG/ML IJ SOLN: 10.0000 mg | INTRAMUSCULAR | Status: DC | PRN

## 2019-01-13 SURGICAL SUPPLY — 12 items
CATH INFINITI 5FR MULTPACK ANG (CATHETERS) ×1 IMPLANT
DEVICE RAD COMP TR BAND LRG (VASCULAR PRODUCTS) ×1 IMPLANT
ELECT DEFIB PAD ADLT CADENCE (PAD) ×1 IMPLANT
GLIDESHEATH SLEND A-KIT 6F 22G (SHEATH) ×1 IMPLANT
GUIDEWIRE INQWIRE 1.5J.035X260 (WIRE) IMPLANT
INQWIRE 1.5J .035X260CM (WIRE) ×2
KIT HEART LEFT (KITS) ×2 IMPLANT
PACK CARDIAC CATHETERIZATION (CUSTOM PROCEDURE TRAY) ×2 IMPLANT
SHEATH PROBE COVER 6X72 (BAG) ×1 IMPLANT
SYR MEDRAD MARK 7 150ML (SYRINGE) ×2 IMPLANT
TRANSDUCER W/STOPCOCK (MISCELLANEOUS) ×2 IMPLANT
TUBING CIL FLEX 10 FLL-RA (TUBING) ×2 IMPLANT

## 2019-01-13 NOTE — Discharge Instructions (Signed)
Radial Site Care ° °This sheet gives you information about how to care for yourself after your procedure. Your health care provider may also give you more specific instructions. If you have problems or questions, contact your health care provider. °What can I expect after the procedure? °After the procedure, it is common to have: °· Bruising and tenderness at the catheter insertion area. °Follow these instructions at home: °Medicines °· Take over-the-counter and prescription medicines only as told by your health care provider. °Insertion site care °· Follow instructions from your health care provider about how to take care of your insertion site. Make sure you: °? Wash your hands with soap and water before you change your bandage (dressing). If soap and water are not available, use hand sanitizer. °? Change your dressing as told by your health care provider. °? Leave stitches (sutures), skin glue, or adhesive strips in place. These skin closures may need to stay in place for 2 weeks or longer. If adhesive strip edges start to loosen and curl up, you may trim the loose edges. Do not remove adhesive strips completely unless your health care provider tells you to do that. °· Check your insertion site every day for signs of infection. Check for: °? Redness, swelling, or pain. °? Fluid or blood. °? Pus or a bad smell. °? Warmth. °· Do not take baths, swim, or use a hot tub until your health care provider approves. °· You may shower 24-48 hours after the procedure, or as directed by your health care provider. °? Remove the dressing and gently wash the site with plain soap and water. °? Pat the area dry with a clean towel. °? Do not rub the site. That could cause bleeding. °· Do not apply powder or lotion to the site. °Activity ° °· For 24 hours after the procedure, or as directed by your health care provider: °? Do not flex or bend the affected arm. °? Do not push or pull heavy objects with the affected arm. °? Do not  drive yourself home from the hospital or clinic. You may drive 24 hours after the procedure unless your health care provider tells you not to. °? Do not operate machinery or power tools. °· Do not lift anything that is heavier than 10 lb (4.5 kg), or the limit that you are told, until your health care provider says that it is safe. °· Ask your health care provider when it is okay to: °? Return to work or school. °? Resume usual physical activities or sports. °? Resume sexual activity. °General instructions °· If the catheter site starts to bleed, raise your arm and put firm pressure on the site. If the bleeding does not stop, get help right away. This is a medical emergency. °· If you went home on the same day as your procedure, a responsible adult should be with you for the first 24 hours after you arrive home. °· Keep all follow-up visits as told by your health care provider. This is important. °Contact a health care provider if: °· You have a fever. °· You have redness, swelling, or yellow drainage around your insertion site. °Get help right away if: °· You have unusual pain at the radial site. °· The catheter insertion area swells very fast. °· The insertion area is bleeding, and the bleeding does not stop when you hold steady pressure on the area. °· Your arm or hand becomes pale, cool, tingly, or numb. °These symptoms may represent a serious problem   that is an emergency. Do not wait to see if the symptoms will go away. Get medical help right away. Call your local emergency services (911 in the [place] Do not drive yourself to the hospital. °Summary °· After the procedure, it is common to have bruising and tenderness at the site. °· Follow instructions from your health care provider about how to take care of your radial site wound. Check the wound every day for signs of infection. °· Do not lift anything that is heavier than 10 lb (4.5 kg), or the limit that you are told, until your health care provider says  that it is safe. °This information is not intended to replace advice given to you by your health care provider. Make sure you discuss any questions you have with your health care provider. °Document Released: 05/09/2010 Document Revised: 05/12/2017 Document Reviewed: 05/12/2017 °Elsevier Patient Education © 2020 [employer] ° °

## 2019-01-13 NOTE — Interval H&P Note (Signed)
History and Physical Interval Note:  01/13/2019 10:49 AM  [name]  has presented today for cardiac catheterization, with the diagnosis of dyspnea on exertion and coronary artery calcification.  The various methods of treatment have been discussed with the patient and family. After consideration of risks, benefits and other options for treatment, the patient has consented to  Procedure(s): LEFT HEART CATH AND CORONARY ANGIOGRAPHY (N/A) as a surgical intervention.  The patient's history has been reviewed, patient examined, no change in status, stable for surgery.  I have reviewed the patient's chart and labs.  Questions were answered to the patient's satisfaction.    Cath Lab Visit (complete for each Cath Lab visit)  Clinical Evaluation Leading to the Procedure:   ACS: No.  Non-ACS:    Anginal Classification: CCS III  Anti-ischemic medical therapy: No Therapy  Non-Invasive Test Results: No non-invasive testing performed  Prior CABG: No previous CABG  Promise Weldin

## 2019-01-16 ENCOUNTER — Other Ambulatory Visit: Payer: Self-pay

## 2019-01-16 ENCOUNTER — Encounter: Payer: Self-pay | Admitting: Podiatry

## 2019-01-16 ENCOUNTER — Ambulatory Visit (INDEPENDENT_AMBULATORY_CARE_PROVIDER_SITE_OTHER): Payer: Medicare Other | Admitting: Podiatry

## 2019-01-16 DIAGNOSIS — M79675 Pain in left toe(s): Secondary | ICD-10-CM | POA: Diagnosis not present

## 2019-01-16 DIAGNOSIS — B351 Tinea unguium: Secondary | ICD-10-CM | POA: Diagnosis not present

## 2019-01-16 DIAGNOSIS — M79674 Pain in right toe(s): Secondary | ICD-10-CM | POA: Diagnosis not present

## 2019-01-16 NOTE — Patient Instructions (Signed)

## 2019-01-17 ENCOUNTER — Ambulatory Visit (INDEPENDENT_AMBULATORY_CARE_PROVIDER_SITE_OTHER): Payer: Medicare Other | Admitting: Neurology

## 2019-01-17 ENCOUNTER — Encounter: Payer: Self-pay | Admitting: Neurology

## 2019-01-17 VITALS — BP 122/78 | HR 72 | Ht 66.0 in | Wt 287.0 lb

## 2019-01-17 DIAGNOSIS — I2510 Atherosclerotic heart disease of native coronary artery without angina pectoris: Secondary | ICD-10-CM | POA: Diagnosis not present

## 2019-01-17 DIAGNOSIS — I10 Essential (primary) hypertension: Secondary | ICD-10-CM

## 2019-01-17 DIAGNOSIS — Z6841 Body Mass Index (BMI) 40.0 and over, adult: Secondary | ICD-10-CM

## 2019-01-17 NOTE — Patient Instructions (Signed)
Thank you for choosing [facility] for your sleep related care! It was nice to meet you today! I appreciate that you entrust me with your sleep related healthcare concerns. I hope, I was able to address at least some of your concerns today, and that I can help you feel reassured and also get better.    Here is what we discussed today and what we came up with as our plan for you:    Based on your symptoms and your exam I believe you are at risk for obstructive sleep apnea (aka OSA), and I think we should proceed with a sleep study to determine whether you do or do not have OSA and how severe it is.   Underlying sleep apnea may be in part connected to your difficult to control blood pressure and your heart disease.  Please call us back when you change your mind about pursuing the sleep study.  Even, if you have mild OSA, I may want you to consider treatment with CPAP, as treatment of even borderline or mild sleep apnea can result and improvement of symptoms such as sleep disruption, daytime sleepiness, nighttime bathroom breaks, restless leg symptoms, improvement of headache syndromes, even improved mood disorder.   Please remember, the long-term risks and ramifications of untreated moderate to severe obstructive sleep apnea are: increased Cardiovascular disease, including congestive heart failure, stroke, difficult to control hypertension, treatment resistant obesity, arrhythmias, especially irregular heartbeat commonly known as A. Fib. (atrial fibrillation); even type 2 diabetes has been linked to untreated OSA.   Sleep apnea can cause disruption of sleep and sleep deprivation in most cases, which, in turn, can cause recurrent headaches, problems with memory, mood, concentration, focus, and vigilance. Most people with untreated sleep apnea report excessive daytime sleepiness, which can affect their ability to drive. Please do not drive if you feel sleepy. Patients with sleep apnea  developed difficulty initiating and maintaining sleep (aka insomnia).   Having sleep apnea may increase your risk for other sleep disorders, including involuntary behaviors sleep such as sleep terrors, sleep talking, sleepwalking.    Having sleep apnea can also increase your risk for restless leg syndrome and leg movements at night.   Please note that untreated obstructive sleep apnea may carry additional perioperative morbidity. Patients with significant obstructive sleep apnea (typically, in the moderate to severe degree) should receive, if possible, perioperative PAP (positive airway pressure) therapy and the surgeons and particularly the anesthesiologists should be informed of the diagnosis and the severity of the sleep disordered breathing.   I will likely see you back after your sleep study to go over the test results and where to go from there. We will call you after your sleep study to advise about the results (most likely, you will hear from Deal, my nurse) and to set up an appointment at the time, as necessary.    Our sleep lab administrative assistant will call you to schedule your sleep study and give you further instructions, regarding the check in process for the sleep study, arrival time, what to bring, when you can expect to leave after the study, etc., and to answer any other logistical questions you may have. If you don't hear back from her by about 2 weeks from now, please feel free to call her direct line at [phone] or you can call our general clinic number, or email [place] through My Chart.

## 2019-01-17 NOTE — Progress Notes (Signed)
Subjective:    Patient ID: [name] Cooper is a 68 y.o. female.  HPI     [name] Age, MD, PhD [facility] [address], [address]O. [place], [place] [address]  Dear [name] Cooper, I saw your patient, [name] Cooper, upon your kind request to my sleep clinic today for initial consultation of her sleep disorder, in particular, concern for underlying obstructive sleep apnea.  The patient is accompanied by her brother today.  As you know, [name] Cooper is a 68 year old right-handed woman with an underlying medical history of reflux disease, diabetes, history of hepatitis, recurrent UTI, hyperlipidemia, coronary artery disease, smoking, urinary incontinence, and morbid obesity with a BMI of over 45, who reports difficulty with control blood pressure and chest pain.  She had a left heart cath on 01/13/2019 which showed single-vessel coronary artery disease with 80% stenosis of with a LAD.  She is on medical management.  She denies any significant snoring but sleeps alone.  She lives with her brother.  She is widowed since 1997.  She has 1 daughter who has sleep apnea.  Patient had a tonsillectomy at Cooper 68.  She denies daytime somnolence.  Bedtime is between 1030 and 11 and rise time between 830 and 9.  She denies night to night nocturia or recurrent morning headaches.  She does not wish to proceed with a sleep study.  She feels that there is nothing wrong with her sleep. I reviewed your office note from 01/02/2019.  Her Epworth sleepiness score is 0 out of 24, fatigue severity score is 9 out of 63.  She smokes about 5 cigarettes/day, she does not utilize alcohol or caffeine on a daily basis.    Her Past Medical History Is Significant For: Past Medical History:  Diagnosis Date  . Cancer ([place])   . Chicken pox   . Chronic lower back pain   . Diabetes ([place])   . GERD (gastroesophageal reflux disease)   . Hepatitis 1970's  . History of recurrent UTIs   . Hyperlipemia   . Slow  heart rate dx'd 09/24/2014  . Urine incontinence     Her Past Surgical History Is Significant For: Past Surgical History:  Procedure Laterality Date  . ABDOMINAL HYSTERECTOMY  1984  . APPENDECTOMY    . BREAST BIOPSY Right 1996  . BREAST LUMPECTOMY Right 1996  . LEFT HEART CATH AND CORONARY ANGIOGRAPHY N/A 01/13/2019   Procedure: LEFT HEART CATH AND CORONARY ANGIOGRAPHY;  Surgeon: Nelva Bush, MD;  Location: [place] [facility];  Service: Cardiovascular;  Laterality: N/A;  . MASTECTOMY Right 1996  . TONSILLECTOMY      Her Family History Is Significant For: Family History  Problem Relation Cooper of Onset  . Cancer Mother   . Early death Mother   . Arthritis Brother   . Colon cancer Neg Hx     Her Social History Is Significant For: Social History   Socioeconomic History  . Marital status: Widowed    Spouse name: Not on file  . Number of children: Not on file  . Years of education: Not on file  . Highest education level: Not on file  Occupational History    Employer: [employer]  Social Needs  . Financial resource strain: Not on file  . Food insecurity    Worry: Not on file    Inability: Not on file  . Transportation needs    Medical: Not on file    Non-medical: Not on file  Tobacco Use  .  Smoking status: Current Every Day Smoker    Packs/day: 0.75    Years: 47.00    Pack years: 35.25    Types: Cigarettes  . Smokeless tobacco: Never Used  . Tobacco comment: 09/24/2014 "I've quit smoking 3 times"  Substance and Sexual Activity  . Alcohol use: No    Alcohol/week: 0.0 standard drinks  . Drug use: No    Types: "Crack" cocaine    Comment: 09/24/2014 "last crack was in 2013"  . Sexual activity: Not on file  Lifestyle  . Physical activity    Days per week: Not on file    Minutes per session: Not on file  . Stress: Not on file  Relationships  . Social Herbalist on phone: Not on file    Gets together: Not on file    Attends religious service: Not  on file    Active member of club or organization: Not on file    Attends meetings of clubs or organizations: Not on file    Relationship status: Not on file  Other Topics Concern  . Not on file  Social History Narrative  . Not on file    Her Allergies Are:  Allergies  Allergen Reactions  . Lisinopril Anaphylaxis  . Codeine     REACTION: MAKES HER SLEEPY  :   Her Current Medications Are:  Outpatient Encounter Medications as of 01/17/2019  Medication Sig  . acetaminophen (TYLENOL) 650 MG CR tablet Take 1,300 mg by mouth every 8 (eight) hours as needed for pain. Reported on 09/23/2015  . albuterol (PROVENTIL HFA;VENTOLIN HFA) 108 (90 Base) MCG/ACT inhaler Inhale 2 puffs into the lungs every 6 (six) hours as needed for wheezing or shortness of breath.  Marland Kitchen aspirin EC 81 MG tablet Take 1 tablet (81 mg total) by mouth daily.  . ferrous sulfate 325 (65 FE) MG tablet Take 325 mg by mouth daily with breakfast.  . hydrALAZINE (APRESOLINE) 25 MG tablet TAKE 1 TABLET BY MOUTH 3  TIMES DAILY (Patient taking differently: Take 25 mg by mouth 3 (three) times daily. )  . isosorbide mononitrate (IMDUR) 30 MG 24 hr tablet Take 1 tablet (30 mg total) by mouth daily.  . Multiple Vitamin (MULTIVITAMIN WITH MINERALS) TABS tablet Take 1 tablet by mouth daily.  . nitroGLYCERIN (NITROSTAT) 0.4 MG SL tablet Place 1 tablet (0.4 mg total) under the tongue every 5 (five) minutes as needed for chest pain.  . rosuvastatin (CRESTOR) 10 MG tablet Take 1 tablet (10 mg total) by mouth at bedtime.  . triamterene-hydrochlorothiazide (MAXZIDE-25) 37.5-25 MG tablet Take 1 tablet by mouth daily.   No facility-administered encounter medications on file as of 01/17/2019.   :  Review of Systems:  Out of a complete 14 point review of systems, all are reviewed and negative with the exception of these symptoms as listed below: Review of Systems  Neurological:       Pt presents today to discuss her sleep. Pt has never had a sleep  study and does not endorse snoring.  Epworth Sleepiness Scale 0= would never doze 1= slight chance of dozing 2= moderate chance of dozing 3= high chance of dozing  Sitting and reading: 0 Watching TV: 0 Sitting inactive in a public place (ex. Theater or meeting): 0 As a passenger in a car for an hour without a break: 0 Lying down to rest in the afternoon: 0 Sitting and talking to someone: 0 Sitting quietly after lunch (no alcohol): 0 In  a car, while stopped in traffic: 0 Total: 0     Objective:  Neurological Exam  Physical Exam Physical Examination:   Vitals:   01/17/19 1547  BP: 122/78  Pulse: 72    General Examination: The patient is a very pleasant 68 y.o. female in no acute distress. She appears well-developed and well-nourished and well groomed.   HEENT: Normocephalic, atraumatic, pupils are equal, round and reactive to Light.  Extraocular tracking is preserved.  Airway examination reveals mild mouth dryness, small airway entry, wider uvula, moderate airway crowding, tonsils Absent, Mallampati class III.  Dentures on top and edentulous on the bottom.Hearing is grossly intact.  Tongue protrudes centrally in palate elevates symmetrically.  She has no carotid bruits.   Chest: Clear to auscultation without wheezing, rhonchi or crackles noted.  Heart: S1+S2+0, regular and normal without murmurs, rubs or gallops noted.   Abdomen: Soft, non-tender and non-distended with normal bowel sounds appreciated on auscultation.  Extremities: There is 1+ pitting edema in the distal lower extremities bilaterally, Around the ankles.  Skin: Warm and dry without trophic changes noted.  Musculoskeletal: exam reveals no obvious joint deformities, tenderness or joint swelling.  Neurologically:  Mental status: The patient is awake, alert and oriented in all 4 spheres. Her immediate and remote memory, attention, language skills and fund of knowledge are appropriate. There is no evidence of  aphasia, agnosia, apraxia or anomia. Speech is clear with normal prosody and enunciation. Thought process is linear. Mood is normal and affect is normal.  Cranial nerves II - XII are as described above under HEENT exam. Motor exam: Normal bulk, strength and tone is noted. There is no tremor. Fine motor skills and coordination: intact with normal finger taps, normal hand movements, normal rapid alternating patting, normal foot taps and normal foot agility.  Cerebellar testing: No dysmetria or intention tremor on finger to nose testing. Heel to shin is unremarkable bilaterally. There is no truncal or gait ataxia.  Sensory exam: intact to light touch in the upper and lower extremities.    Assessment and Plan:   In summary, [name] Cooper is a very pleasant 68 y.o.-year old female with an underlying medical history of reflux disease, diabetes, history of hepatitis, recurrent UTI, hyperlipidemia, coronary artery disease, smoking, urinary incontinence, and morbid obesity with a BMI of over 45, whoPresents for sleep evaluation.  While she does not give a telltale history of obstructive sleep apnea, given her history of coronary artery disease, difficult to control blood pressure and morbid obesity as well as crowded airway, she is likely at risk for obstructive sleep apnea.  She has a daughter with sleep apnea.  I advised the patient that sleep study testing would be indicated.  She could declines to proceed with a sleep study. I discussed the diagnosis of OSA with the patient and her brother.  I explained the risks and ramifications of untreated sleep apnea especially if it is in the moderate or severe range.  We talked about the importance of secondary prevention for cardiovascular disease.  She is advised regarding sleep study testing and I explained the procedure to her.  She is encouraged to call our office should she change her mind about sleep study testing.  I answered all their questions today and the  patient and her brother were in agreement.   Thank you very much for allowing me to participate in the care of this nice patient. If I can be of any further assistance to you please do not  hesitate to call me at [phone].  Sincerely,   [name] Age, MD, PhD

## 2019-01-18 NOTE — Progress Notes (Signed)
Subjective: [name] Cooper is seen today for follow up painful, elongated, thickened toenails 1-5 b/l feet that she cannot cut. Pain interferes with daily activities. Aggravating factor includes wearing enclosed shoe gear and relieved with periodic debridement.  She voices no new pedal problems on today's visit.  Current Outpatient Medications on File Prior to Visit  Medication Sig  . acetaminophen (TYLENOL) 650 MG CR tablet Take 1,300 mg by mouth every 8 (eight) hours as needed for pain. Reported on 09/23/2015  . albuterol (PROVENTIL HFA;VENTOLIN HFA) 108 (90 Base) MCG/ACT inhaler Inhale 2 puffs into the lungs every 6 (six) hours as needed for wheezing or shortness of breath.  Marland Kitchen aspirin EC 81 MG tablet Take 1 tablet (81 mg total) by mouth daily.  . ferrous sulfate 325 (65 FE) MG tablet Take 325 mg by mouth daily with breakfast.  . hydrALAZINE (APRESOLINE) 25 MG tablet TAKE 1 TABLET BY MOUTH 3  TIMES DAILY (Patient taking differently: Take 25 mg by mouth 3 (three) times daily. )  . isosorbide mononitrate (IMDUR) 30 MG 24 hr tablet Take 1 tablet (30 mg total) by mouth daily.  . Multiple Vitamin (MULTIVITAMIN WITH MINERALS) TABS tablet Take 1 tablet by mouth daily.  . nitroGLYCERIN (NITROSTAT) 0.4 MG SL tablet Place 1 tablet (0.4 mg total) under the tongue every 5 (five) minutes as needed for chest pain.  . rosuvastatin (CRESTOR) 10 MG tablet Take 1 tablet (10 mg total) by mouth at bedtime.  . triamterene-hydrochlorothiazide (MAXZIDE-25) 37.5-25 MG tablet Take 1 tablet by mouth daily.   No current facility-administered medications on file prior to visit.      Allergies  Allergen Reactions  . Lisinopril Anaphylaxis  . Codeine     REACTION: MAKES HER SLEEPY     Objective:  Vascular Examination: Capillary refill time immediate x 10 digits.  Dorsalis pedis and posterior tibial pulses are faintly palpable bilaterally.  Digital hair  present x 10 digits.  Skin temperature gradient WNL  b/l.   Dermatological Examination: Skin with normal turgor, texture and tone b/l  Toenails 1-5 b/l discolored, thick, dystrophic with subungual debris and pain with palpation to nailbeds due to thickness of nails.  Musculoskeletal: Muscle strength 5/5 bilaterally to all LE muscle groups.  No gross bony deformities b/l.  No pain, crepitus or joint limitation noted with ROM.   Neurological Examination: Protective sensation intact 5/5 bilaterally with 10 gram monofilament.  Assessment: Painful onychomycosis toenails 1-5 b/l   Plan: 1. Toenails 1-5 b/l were debrided in length and girth without iatrogenic bleeding. 2. Patient to continue soft, supportive shoe gear 3. Patient to report any pedal injuries to medical professional immediately. 4. Follow up 3 months.  5. Patient/POA to call should there be a concern in the interim.

## 2019-01-26 NOTE — Progress Notes (Signed)
CARDIOLOGY OFFICE NOTE  Date:  01/30/2019    [name] Cooper Date of Birth: 04/11/1951 Medical Record [id number]  PCP:  Briscoe Deutscher, DO  Cardiologist:  [facility]   Chief Complaint  Patient presents with  . Follow-up    Post cath visit - seen for Dr. Marlou Porch    History of Present Illness: [name] Cooper is a 68 y.o. female who presents today for a 3 week check. Seen for Dr. Marlou Porch.   She has a history of tobacco abuse, palpitations, dyspnea with prior CT showing coronary artery atherosclerosis with dense calcification of the LAD and LCX and mild mid to distal RCA disease.   Seen here 3 weeks ago by Dr. Marlou Porch following the CT scan - EKG noted to be abnormal and concerning for ischemia. She was referred for cardiac cath - see below - to start medical management but could be considered for orbital atherectomy of the mid LAD. Her shortness of breath was not felt to be suggestive of angina.    The patient does not have symptoms concerning for COVID-19 infection (fever, chills, cough, or new shortness of breath).   Comes in today. Here alone. Doing ok. She has had a headache with the Imdur. BP is good. No chest pain. Breathing is unchanged. She is smoking less and says she is going to be able to stop. Needs PCP - hers is no longer available. No problems with her cath site. No recent lipids noted. Other than the headache, she feels like she is ok.   Past Medical History:  Diagnosis Date  . Cancer ([place])   . Chicken pox   . Chronic lower back pain   . Diabetes ([place])   . GERD (gastroesophageal reflux disease)   . Hepatitis 1970's  . History of recurrent UTIs   . Hyperlipemia   . Slow heart rate dx'd 09/24/2014  . Urine incontinence     Past Surgical History:  Procedure Laterality Date  . ABDOMINAL HYSTERECTOMY  1984  . APPENDECTOMY    . BREAST BIOPSY Right 1996  . BREAST LUMPECTOMY Right 1996  . LEFT HEART CATH AND CORONARY ANGIOGRAPHY N/A 01/13/2019   Procedure: LEFT  HEART CATH AND CORONARY ANGIOGRAPHY;  Surgeon: Nelva Bush, MD;  Location: [place] [facility];  Service: Cardiovascular;  Laterality: N/A;  . MASTECTOMY Right 1996  . TONSILLECTOMY       Medications: Current Meds  Medication Sig  . acetaminophen (TYLENOL) 650 MG CR tablet Take 1,300 mg by mouth every 8 (eight) hours as needed for pain. Reported on 09/23/2015  . albuterol (PROVENTIL HFA;VENTOLIN HFA) 108 (90 Base) MCG/ACT inhaler Inhale 2 puffs into the lungs every 6 (six) hours as needed for wheezing or shortness of breath.  Marland Kitchen aspirin EC 81 MG tablet Take 1 tablet (81 mg total) by mouth daily.  . ferrous sulfate 325 (65 FE) MG tablet Take 325 mg by mouth daily with breakfast.  . hydrALAZINE (APRESOLINE) 25 MG tablet TAKE 1 TABLET BY MOUTH 3  TIMES DAILY (Patient taking differently: Take 25 mg by mouth 3 (three) times daily. )  . isosorbide mononitrate (IMDUR) 30 MG 24 hr tablet Take 1 tablet (30 mg total) by mouth daily.  . Multiple Vitamin (MULTIVITAMIN WITH MINERALS) TABS tablet Take 1 tablet by mouth daily.  . nitroGLYCERIN (NITROSTAT) 0.4 MG SL tablet Place 1 tablet (0.4 mg total) under the tongue every 5 (five) minutes as needed for chest pain.  . rosuvastatin (CRESTOR) 10 MG tablet Take  1 tablet (10 mg total) by mouth at bedtime.  . triamterene-hydrochlorothiazide (MAXZIDE-25) 37.5-25 MG tablet Take 1 tablet by mouth daily.     Allergies: Allergies  Allergen Reactions  . Lisinopril Anaphylaxis  . Codeine     REACTION: MAKES HER SLEEPY    Social History: The patient  reports that she has been smoking cigarettes. She has a 35.25 pack-year smoking history. She has never used smokeless tobacco. She reports that she does not drink alcohol or use drugs.   Family History: The patient's family history includes Arthritis in her brother; Cancer in her mother; Early death in her mother.   Review of Systems: Please see the history of present illness.   All other systems are reviewed  and negative.   Physical Exam: VS:  BP 120/80 (BP Location: Left Arm, Patient Position: Sitting, Cuff Size: Large)   Pulse 78   Ht 5\' 6"  (1.676 m)   Wt 289 lb 1.9 oz (131.1 kg)   LMP  (LMP Unknown)   SpO2 96%   BMI 46.67 kg/m  .  BMI Body mass index is 46.67 kg/m.  Wt Readings from Last 3 Encounters:  01/30/19 289 lb 1.9 oz (131.1 kg)  01/17/19 287 lb (130.2 kg)  01/13/19 288 lb (130.6 kg)    General: Alert and in no acute distress.  She is obese.  HEENT: Normal.  Neck: Supple, no JVD, carotid bruits, or masses noted.  Cardiac: Regular rate and rhythm. No murmurs, rubs, or gallops. No edema.  Respiratory:  Lungs are coarse.  GI: Soft and nontender.  MS: No deformity or atrophy. Gait and ROM intact.  Skin: Warm and dry. Color is normal.  Neuro:  Strength and sensation are intact and no gross focal deficits noted.  Psych: Alert, appropriate and with normal affect. Left wrist cath site is fine. Marland Kitchen   LABORATORY DATA:  EKG:  EKG is not ordered today.  Lab Results  Component Value Date   WBC 10.8 01/11/2019   HGB 12.6 01/11/2019   HCT 41.0 01/11/2019   PLT 284 01/11/2019   GLUCOSE 95 01/03/2019   CHOL 242 (H) 06/07/2018   TRIG 311.0 (H) 06/07/2018   HDL 48.60 06/07/2018   LDLDIRECT 159.0 06/07/2018   LDLCALC 94 07/19/2017   ALT 9 06/07/2018   AST 14 06/07/2018   NA 140 01/03/2019   K 3.6 01/03/2019   CL 101 01/03/2019   CREATININE 1.16 01/03/2019   BUN 15 01/03/2019   CO2 30 01/03/2019   TSH 2.22 06/07/2018   INR 1.0 02/23/2008   HGBA1C 6.3 06/07/2018     BNP (last 3 results) No results for input(s): BNP in the last 8760 hours.  ProBNP (last 3 results) No results for input(s): PROBNP in the last 8760 hours.   Other Studies Reviewed Today:  LEFT HEART CATH AND CORONARY ANGIOGRAPHY 12/2018  Conclusion  Conclusions: 1. Significant single vessel coronary artery disease with moderately to severely calcified 80% mid LAD stenosis. 2. Mild, non-obstructive  coronary artery disease involving the LCx and RCA. 3. Normal left ventricular systolic function and filling pressure.  Recommendations: 1. Given lack of chest pain (though dyspnea on exertion may be anginal equivalent) and no ongoing antianginal therapy, I recommend trial of isosorbide mononitrate.  If Ms. Ferring has persistent symptoms despite optimal medical therapy at follow-up, PCI to mid LAD with orbital atherectomy will need to be considered. 2. Aggressive secondary prevention.  Nelva Bush, MD [facility] Pager: [phone]   Holter Study  Highlights 09/2016    PVC rare (23)  PAC rare (259)  Occasional paroxysmal atrial tachycardia  Rare slow successive ventricular beats (4 beats) at 92 BPM  Occasional bradycardia when appropriate (6am)   Palpitations are related to paroxysmal atrial tachycardia (benign) Would continue to encourage weight loss, exercise.  If symptoms become more bothersome, we could consider low dose metoprolol or diltiazem.   Candee Furbish, MD     Assessment/Plan:  1. CAD - s/p recent cath - to be managed medically but could be considered for arthrectomy if fails on medical therapy - her shortness of breath is unchanged per her report - possibly related to her lungs/cigarettes. Will need to follow.   2. Morbid obesity   3. Ongoing tobacco abuse - total cessation encouraged. She says is smoking less and has a plan to stop.   4. HTN - BP is great - no changes made today.   5. HLD - rechecking lipids today.   6. Health maintenance - list for PCPs given to her today.   7. COVID-19 Education: The signs and symptoms of COVID-19 were discussed with the patient and how to seek care for testing (follow up with PCP or arrange E-visit).  The importance of social distancing, staying at home, hand hygiene and wearing a mask when out in public were discussed today.  Current medicines are reviewed with the patient today.  The patient does not  have concerns regarding medicines other than what has been noted above.  The following changes have been made:  See above.  Labs/ tests ordered today include:    Orders Placed This Encounter  Procedures  . Basic metabolic panel  . Hepatic function panel  . Lipid panel     Disposition:   FU with [place] in about 6 weeks to recheck.   Patient is agreeable to this plan and will call if any problems develop in the interim.   SignedTruitt Merle, NP  01/30/2019 1:26 PM  [place] [address] [place], [place]  [address] Phone: [phone] Fax: [phone]

## 2019-01-30 ENCOUNTER — Ambulatory Visit: Payer: Medicare Other | Admitting: Nurse Practitioner

## 2019-01-30 ENCOUNTER — Encounter: Payer: Self-pay | Admitting: Nurse Practitioner

## 2019-01-30 ENCOUNTER — Ambulatory Visit (INDEPENDENT_AMBULATORY_CARE_PROVIDER_SITE_OTHER): Payer: Medicare Other | Admitting: Nurse Practitioner

## 2019-01-30 ENCOUNTER — Other Ambulatory Visit: Payer: Self-pay

## 2019-01-30 VITALS — BP 120/80 | HR 78 | Ht 66.0 in | Wt 289.1 lb

## 2019-01-30 DIAGNOSIS — Z9889 Other specified postprocedural states: Secondary | ICD-10-CM | POA: Diagnosis not present

## 2019-01-30 DIAGNOSIS — I2510 Atherosclerotic heart disease of native coronary artery without angina pectoris: Secondary | ICD-10-CM

## 2019-01-30 DIAGNOSIS — E782 Mixed hyperlipidemia: Secondary | ICD-10-CM | POA: Diagnosis not present

## 2019-01-30 DIAGNOSIS — Z7189 Other specified counseling: Secondary | ICD-10-CM

## 2019-01-30 NOTE — Patient Instructions (Addendum)
After Visit Summary:  We will be checking the following labs today - BMET, HPF and lipids   Medication Instructions:    Continue with your current medicines.   Ok to try taking the Imdur at night time - this may help with the headache - also ok to use Tylenol if needed.    If you need a refill on your cardiac medications before your next appointment, please call your pharmacy.     Testing/Procedures To Be Arranged:  N/A  Follow-Up:   See Dr. Marlou Porch in about 4 to 6 weeks.     At [facility], you and your health needs are our priority.  As part of our continuing mission to provide you with exceptional heart care, we have created designated Provider Care Teams.  These Care Teams include your primary Cardiologist (physician) and Advanced Practice Providers (APPs -  Physician Assistants and Nurse Practitioners) who all work together to provide you with the care you need, when you need it.  Special Instructions:  . Stay safe, stay home, wash your hands for at least 20 seconds and wear a mask when out in public.  . It was good to talk with you today.    Call the [place] office at [phone] if you have any questions, problems or concerns.

## 2019-01-31 ENCOUNTER — Other Ambulatory Visit: Payer: Self-pay | Admitting: *Deleted

## 2019-01-31 DIAGNOSIS — E782 Mixed hyperlipidemia: Secondary | ICD-10-CM

## 2019-01-31 LAB — HEPATIC FUNCTION PANEL
ALT: 10 IU/L (ref 0–32)
AST: 17 IU/L (ref 0–40)
Albumin: 4.2 g/dL (ref 3.8–4.8)
Alkaline Phosphatase: 116 IU/L (ref 39–117)
Bilirubin Total: 0.4 mg/dL (ref 0.0–1.2)
Bilirubin, Direct: 0.17 mg/dL (ref 0.00–0.40)
Total Protein: 7.1 g/dL (ref 6.0–8.5)

## 2019-01-31 LAB — LIPID PANEL
Chol/HDL Ratio: 3 ratio (ref 0.0–4.4)
Cholesterol, Total: 162 mg/dL (ref 100–199)
HDL: 54 mg/dL (ref >39)
LDL Chol Calc (NIH): 90 mg/dL (ref 0–99)
Triglycerides: 101 mg/dL (ref 0–149)
VLDL Cholesterol Cal: 18 mg/dL (ref 5–40)

## 2019-01-31 LAB — BASIC METABOLIC PANEL
BUN/Creatinine Ratio: 13 (ref 12–28)
BUN: 18 mg/dL (ref 8–27)
CO2: 24 mmol/L (ref 20–29)
Calcium: 9.8 mg/dL (ref 8.7–10.3)
Chloride: 101 mmol/L (ref 96–106)
Creatinine, Ser: 1.39 mg/dL — ABNORMAL HIGH (ref 0.57–1.00)
GFR calc Af Amer: 45 mL/min/{1.73_m2} — ABNORMAL LOW (ref >59)
GFR calc non Af Amer: 39 mL/min/{1.73_m2} — ABNORMAL LOW (ref >59)
Glucose: 84 mg/dL (ref 65–99)
Potassium: 4.1 mmol/L (ref 3.5–5.2)
Sodium: 140 mmol/L (ref 134–144)

## 2019-01-31 MED ORDER — ROSUVASTATIN CALCIUM 20 MG PO TABS: 20.0000 mg | ORAL_TABLET | Freq: Every day | ORAL | 3 refills | Status: DC

## 2019-02-03 ENCOUNTER — Telehealth (HOSPITAL_COMMUNITY): Payer: Self-pay

## 2019-02-03 NOTE — Telephone Encounter (Signed)
New message    Call the patient to set up an echocardiogram order by Ewell Poe.   The patient stated she knows nothing about this echocardiogram from a previous appointment on 01/02/19.

## 2019-02-03 NOTE — Telephone Encounter (Signed)
Patient was seen by our office in September 2020.  Her blood pressure was elevated, specifically her diastolic.  It was recommended that she have a sleep study and also consider an ultrasound of her heart.  She verbalized understanding to this, and this is documented in the lab result on 01/06/2019.    I do see that she is now seeing cardiology, so if she would like to defer any further cardiac imaging or work-up to their discretion, she can decline the echocardiogram at this time and just follow-up with cardiology if she prefers.

## 2019-02-06 ENCOUNTER — Encounter: Payer: Self-pay | Admitting: Family Medicine

## 2019-02-06 ENCOUNTER — Ambulatory Visit (INDEPENDENT_AMBULATORY_CARE_PROVIDER_SITE_OTHER): Payer: Medicare Other

## 2019-02-06 ENCOUNTER — Other Ambulatory Visit: Payer: Self-pay

## 2019-02-06 DIAGNOSIS — Z23 Encounter for immunization: Secondary | ICD-10-CM | POA: Diagnosis not present

## 2019-02-06 NOTE — Telephone Encounter (Signed)
LM for patient to return call.

## 2019-02-07 NOTE — Telephone Encounter (Signed)
LM for patient to return call.

## 2019-02-10 LAB — HM DIABETES EYE EXAM

## 2019-03-03 ENCOUNTER — Encounter: Payer: Self-pay | Admitting: Cardiology

## 2019-03-03 ENCOUNTER — Ambulatory Visit (INDEPENDENT_AMBULATORY_CARE_PROVIDER_SITE_OTHER): Payer: Medicare Other | Admitting: Cardiology

## 2019-03-03 ENCOUNTER — Other Ambulatory Visit: Payer: Self-pay

## 2019-03-03 VITALS — BP 130/76 | HR 65 | Ht 66.0 in | Wt 292.0 lb

## 2019-03-03 DIAGNOSIS — I2510 Atherosclerotic heart disease of native coronary artery without angina pectoris: Secondary | ICD-10-CM | POA: Diagnosis not present

## 2019-03-03 DIAGNOSIS — E782 Mixed hyperlipidemia: Secondary | ICD-10-CM

## 2019-03-03 DIAGNOSIS — Z720 Tobacco use: Secondary | ICD-10-CM

## 2019-03-03 NOTE — Patient Instructions (Signed)
Medication Instructions:  The current medical regimen is effective;  continue present plan and medications.  *If you need a refill on your cardiac medications before your next appointment, please call your pharmacy*  Follow-Up: At [facility], you and your health needs are our priority.  As part of our continuing mission to provide you with exceptional heart care, we have created designated Provider Care Teams.  These Care Teams include your primary Cardiologist (physician) and Advanced Practice Providers (APPs -  Physician Assistants and Nurse Practitioners) who all work together to provide you with the care you need, when you need it.  Your next appointment:   6 month(s)  The format for your next appointment:   In Person  Provider:   You may see Mark Skains, MD or one of the following Advanced Practice Providers on your designated Care Team:    Lori Gerhardt, NP  Laura Ingold, NP  Jill McDaniel, NP   Thank you for choosing [facility]!!      

## 2019-03-03 NOTE — Progress Notes (Signed)
Cardiology Office Note:    Date:  03/03/2019   ID:  [name] Cooper, DOB October 23, 1950, MRN [id number]  PCP:  Briscoe Deutscher, DO  Cardiologist:  Candee Furbish, MD  Electrophysiologist:  None   Referring MD: Briscoe Deutscher, DO     History of Present Illness:    [name] Cooper is a 68 y.o. female with tobacco use, dyspnea with CT scan lung screening with the following:  IMPRESSION: 1. Lung-RADS 2, benign appearance or behavior. Continue annual screening with low-dose chest CT without contrast in 12 months. 2. Coronary artery atherosclerosis. Personally viewed, dense calcification LAD and CIRC, mild mid to distal RCA.  LDL 159 down to 90  EKG 01/11/2019 shows sinus bradycardia 55 and T wave inversions noted in V2 V3 and V4 concerning for ischemia.  She has been experiencing dyspnea, dyspnea on exertion with minimal physical activity.  No significant chest tightness.  No syncope no bleeding no orthopnea no PND.   Blood pressure has been erratic but currently is under good control.  I previously seen her for palpitations in 2018 but she is doing well with this.  No early FHX of CAD.  Tob at 68 year old.   03/03/2019  - here for CAD follow up.  Had cardiac catheterization see below.  She is doing well without any anginal symptoms.  She does have an 80% LAD lesion that is being treated medically since she is not having any angina.  She does have shortness of breath which could be related to her tobacco use and COPD.  She is followed by pulmonary.  She is taking isosorbide and doing well.  Denies any fevers chills nausea vomiting syncope bleeding.  Past Medical History:  Diagnosis Date  . Cancer ([place])   . Chicken pox   . Chronic lower back pain   . Diabetes ([place])   . GERD (gastroesophageal reflux disease)   . Hepatitis 1970's  . History of recurrent UTIs   . Hyperlipemia   . Slow heart rate dx'd 09/24/2014  . Urine incontinence     Past Surgical History:  Procedure Laterality  Date  . ABDOMINAL HYSTERECTOMY  1984  . APPENDECTOMY    . BREAST BIOPSY Right 1996  . BREAST LUMPECTOMY Right 1996  . LEFT HEART CATH AND CORONARY ANGIOGRAPHY N/A 01/13/2019   Procedure: LEFT HEART CATH AND CORONARY ANGIOGRAPHY;  Surgeon: Nelva Bush, MD;  Location: [place] [facility];  Service: Cardiovascular;  Laterality: N/A;  . MASTECTOMY Right 1996  . TONSILLECTOMY      Current Medications: Current Meds  Medication Sig  . acetaminophen (TYLENOL) 650 MG CR tablet Take 1,300 mg by mouth every 8 (eight) hours as needed for pain. Reported on 09/23/2015  . albuterol (PROVENTIL HFA;VENTOLIN HFA) 108 (90 Base) MCG/ACT inhaler Inhale 2 puffs into the lungs every 6 (six) hours as needed for wheezing or shortness of breath.  Marland Kitchen aspirin EC 81 MG tablet Take 1 tablet (81 mg total) by mouth daily.  . ferrous sulfate 325 (65 FE) MG tablet Take 325 mg by mouth daily with breakfast.  . hydrALAZINE (APRESOLINE) 25 MG tablet TAKE 1 TABLET BY MOUTH 3  TIMES DAILY  . isosorbide mononitrate (IMDUR) 30 MG 24 hr tablet Take 1 tablet (30 mg total) by mouth daily.  . Multiple Vitamin (MULTIVITAMIN WITH MINERALS) TABS tablet Take 1 tablet by mouth daily.  . nitroGLYCERIN (NITROSTAT) 0.4 MG SL tablet Place 1 tablet (0.4 mg total) under the tongue every 5 (five) minutes as needed  for chest pain.  . rosuvastatin (CRESTOR) 20 MG tablet Take 1 tablet (20 mg total) by mouth daily.  Marland Kitchen triamterene-hydrochlorothiazide (MAXZIDE-25) 37.5-25 MG tablet Take 1 tablet by mouth daily.     Allergies:   Lisinopril and Codeine   Social History   Socioeconomic History  . Marital status: Widowed    Spouse name: Not on file  . Number of children: Not on file  . Years of education: Not on file  . Highest education level: Not on file  Occupational History    Employer: [employer]  Social Needs  . Financial resource strain: Not on file  . Food insecurity    Worry: Not on file    Inability: Not on file  .  Transportation needs    Medical: Not on file    Non-medical: Not on file  Tobacco Use  . Smoking status: Current Every Day Smoker    Packs/day: 0.75    Years: 47.00    Pack years: 35.25    Types: Cigarettes  . Smokeless tobacco: Never Used  . Tobacco comment: 09/24/2014 "I've quit smoking 3 times"  Substance and Sexual Activity  . Alcohol use: No    Alcohol/week: 0.0 standard drinks  . Drug use: No    Types: "Crack" cocaine    Comment: 09/24/2014 "last crack was in 2013"  . Sexual activity: Not on file  Lifestyle  . Physical activity    Days per week: Not on file    Minutes per session: Not on file  . Stress: Not on file  Relationships  . Social Herbalist on phone: Not on file    Gets together: Not on file    Attends religious service: Not on file    Active member of club or organization: Not on file    Attends meetings of clubs or organizations: Not on file    Relationship status: Not on file  Other Topics Concern  . Not on file  Social History Narrative  . Not on file     Family History: The patient's family history includes Arthritis in her brother; Cancer in her mother; Early death in her mother. There is no history of Colon cancer.  ROS:   Please see the history of present illness.    No fevers chills nausea vomiting syncope bleeding all other systems reviewed and are negative.  EKGs/Labs/Other Studies Reviewed:    The following studies were reviewed today: Coronary calcium noted on CT scan personally reviewed.  Cath 01/13/19: 1. Significant single vessel coronary artery disease with moderately to severely calcified 80% mid LAD stenosis. 2. Mild, non-obstructive coronary artery disease involving the LCx and RCA. 3. Normal left ventricular systolic function and filling pressure.  Recommendations: 1. Given lack of chest pain (though dyspnea on exertion may be anginal equivalent) and no ongoing antianginal therapy, I recommend trial of isosorbide  mononitrate.  If [name] Cooper has persistent symptoms despite optimal medical therapy at follow-up, PCI to mid LAD with orbital atherectomy will need to be considered. 2. Aggressive secondary prevention.  Diagnostic Dominance: Right    EKG:   The ekg ordered today demonstrates sinus bradycardia 55 with T wave inversion noted in the anterior leads concerning for ischemia  Recent Labs: 06/07/2018: TSH 2.22 01/11/2019: Hemoglobin 12.6; Platelets 284 01/30/2019: ALT 10; BUN 18; Creatinine, Ser 1.39; Potassium 4.1; Sodium 140  Recent Lipid Panel    Component Value Date/Time   CHOL 162 01/30/2019 1330   TRIG 101 01/30/2019 1330  HDL 54 01/30/2019 1330   CHOLHDL 3.0 01/30/2019 1330   CHOLHDL 5 06/07/2018 1446   VLDL 62.2 (H) 06/07/2018 1446   LDLCALC 90 01/30/2019 1330   LDLDIRECT 159.0 06/07/2018 1446    Physical Exam:    VS:  BP 130/76   Pulse 65   Ht 5\' 6"  (1.676 m)   Wt 292 lb (132.5 kg)   LMP  (LMP Unknown)   SpO2 97%   BMI 47.13 kg/m     Wt Readings from Last 3 Encounters:  03/03/19 292 lb (132.5 kg)  01/30/19 289 lb 1.9 oz (131.1 kg)  01/17/19 287 lb (130.2 kg)     GEN: Obese Well nourished, well developed, in no acute distress  HEENT: normal  Neck: no JVD, carotid bruits, or masses Cardiac: RRR; no murmurs, rubs, or gallops,no edema  Respiratory:  clear to auscultation bilaterally, normal work of breathing GI: soft, nontender, nondistended, + BS MS: no deformity or atrophy  Skin: warm and dry, no rash Neuro:  Alert and Oriented x 3, Strength and sensation are intact Psych: euthymic mood, full affect   ASSESSMENT:    1. Atherosclerosis of native coronary artery of native heart without angina pectoris   2. Mixed hyperlipidemia   3. Morbid obesity ([place])   4. Tobacco use    PLAN:    In order of problems listed above:  Coronary artery disease-densely calcified LAD lesion  - LAD on catheterization 01/13/2019 demonstrates an 80% severely calcified stenosis.   Because she is not having any significant chest discomfort, trial of isosorbide has been initiated.  Her shortness of breath may be her anginal equivalent.  Overall she is doing quite well with her current medication strategy.  Mild nonobstructive disease in the circumflex and right coronary artery noted.  Normal LVEDP. - Crestor 10 has been started. 09/19/2018 by Dr. Juleen China.  -Started aspirin 81 mg   - Continue with HTN control.   -Continuing to use inhaler, Dr.Wert's note from 10/11/18 reviewed. -Since she is doing very well we will continue with current regimen.   Morbid obesity -Continue to encourage weight loss.  Decrease carbohydrates.  Discussed  Essential hypertension -Continue with optimal blood pressure control.  Multidrug regimen reviewed.  Tobacco use -Continue to encourage tobacco cessation.  This will help with overall plaque stabilization as well.    Medication Adjustments/Labs and Tests Ordered: Current medicines are reviewed at length with the patient today.  Concerns regarding medicines are outlined above.  No orders of the defined types were placed in this encounter.  No orders of the defined types were placed in this encounter.   Patient Instructions  Medication Instructions:  The current medical regimen is effective;  continue present plan and medications.  *If you need a refill on your cardiac medications before your next appointment, please call your pharmacy*  Follow-Up: At [facility], you and your health needs are our priority.  As part of our continuing mission to provide you with exceptional heart care, we have created designated Provider Care Teams.  These Care Teams include your primary Cardiologist (physician) and Advanced Practice Providers (APPs -  Physician Assistants and Nurse Practitioners) who all work together to provide you with the care you need, when you need it.  Your next appointment:   6 months  The format for your next appointment:    In Person  Provider:   You may see Candee Furbish, MD or one of the following Advanced Practice Providers on your designated Care Team:  Truitt Merle, NP  Cecilie Kicks, NP  Kathyrn Drown, NP  Thank you for choosing [facility]!!        Signed, Candee Furbish, MD  03/03/2019 11:54 AM    [place]

## 2019-04-04 ENCOUNTER — Other Ambulatory Visit: Payer: Self-pay

## 2019-04-05 ENCOUNTER — Encounter

## 2019-04-05 ENCOUNTER — Encounter: Payer: Self-pay | Admitting: Family Medicine

## 2019-04-05 ENCOUNTER — Ambulatory Visit (INDEPENDENT_AMBULATORY_CARE_PROVIDER_SITE_OTHER): Payer: Medicare Other | Admitting: Family Medicine

## 2019-04-05 VITALS — BP 124/80 | HR 72 | Temp 97.8°F | Ht 66.0 in | Wt 291.6 lb

## 2019-04-05 DIAGNOSIS — Z87891 Personal history of nicotine dependence: Secondary | ICD-10-CM | POA: Diagnosis not present

## 2019-04-05 DIAGNOSIS — E559 Vitamin D deficiency, unspecified: Secondary | ICD-10-CM | POA: Diagnosis not present

## 2019-04-05 DIAGNOSIS — R7303 Prediabetes: Secondary | ICD-10-CM | POA: Diagnosis not present

## 2019-04-05 DIAGNOSIS — I10 Essential (primary) hypertension: Secondary | ICD-10-CM

## 2019-04-05 LAB — CBC WITH DIFFERENTIAL/PLATELET
Basophils Absolute: 0.1 10*3/uL (ref 0.0–0.1)
Basophils Relative: 1.4 % (ref 0.0–3.0)
Eosinophils Absolute: 0.8 10*3/uL — ABNORMAL HIGH (ref 0.0–0.7)
Eosinophils Relative: 8 % — ABNORMAL HIGH (ref 0.0–5.0)
HCT: 36.4 % (ref 36.0–46.0)
Hemoglobin: 11.3 g/dL — ABNORMAL LOW (ref 12.0–15.0)
Lymphocytes Relative: 37.7 % (ref 12.0–46.0)
Lymphs Abs: 3.8 10*3/uL (ref 0.7–4.0)
MCHC: 31.1 g/dL (ref 30.0–36.0)
MCV: 70.3 fl — ABNORMAL LOW (ref 78.0–100.0)
Monocytes Absolute: 0.9 10*3/uL (ref 0.1–1.0)
Monocytes Relative: 8.6 % (ref 3.0–12.0)
Neutro Abs: 4.4 10*3/uL (ref 1.4–7.7)
Neutrophils Relative %: 44.3 % (ref 43.0–77.0)
Platelets: 293 10*3/uL (ref 150.0–400.0)
RBC: 5.18 Mil/uL — ABNORMAL HIGH (ref 3.87–5.11)
RDW: 16.5 % — ABNORMAL HIGH (ref 11.5–15.5)
WBC: 10 10*3/uL (ref 4.0–10.5)

## 2019-04-05 LAB — COMPREHENSIVE METABOLIC PANEL
ALT: 8 U/L (ref 0–35)
AST: 13 U/L (ref 0–37)
Albumin: 4.1 g/dL (ref 3.5–5.2)
Alkaline Phosphatase: 99 U/L (ref 39–117)
BUN: 16 mg/dL (ref 6–23)
CO2: 31 mEq/L (ref 19–32)
Calcium: 9.2 mg/dL (ref 8.4–10.5)
Chloride: 100 mEq/L (ref 96–112)
Creatinine, Ser: 1.28 mg/dL — ABNORMAL HIGH (ref 0.40–1.20)
GFR: 49.98 mL/min — ABNORMAL LOW (ref >60.00)
Glucose, Bld: 92 mg/dL (ref 70–99)
Potassium: 3.5 mEq/L (ref 3.5–5.1)
Sodium: 139 mEq/L (ref 135–145)
Total Bilirubin: 0.4 mg/dL (ref 0.2–1.2)
Total Protein: 7.5 g/dL (ref 6.0–8.3)

## 2019-04-05 LAB — VITAMIN D 25 HYDROXY (VIT D DEFICIENCY, FRACTURES): VITD: 20.97 ng/mL — ABNORMAL LOW (ref 30.00–100.00)

## 2019-04-05 LAB — MICROALBUMIN / CREATININE URINE RATIO
Creatinine,U: 89.5 mg/dL
Microalb Creat Ratio: 0.8 mg/g (ref 0.0–30.0)
Microalb, Ur: <0.7 mg/dL (ref 0.0–1.9)

## 2019-04-05 LAB — HEMOGLOBIN A1C: Hgb A1c MFr Bld: 6.7 % — ABNORMAL HIGH (ref 4.6–6.5)

## 2019-04-05 NOTE — Patient Instructions (Signed)
Doing great! Let me know when you need refills. See you back in 6 months.  Merry christmas,  Dr. Rogers Blocker

## 2019-04-05 NOTE — Progress Notes (Signed)
Patient: [name] Cooper MRN: [id number] DOB: 1951/03/03 PCP: Briscoe Deutscher, DO     Subjective:  Chief Complaint  Patient presents with  . Hypertension  . prediabetes  . vitamin d deficiency    HPI: The patient is a 68 y.o. female who presents today for follow up on hypertension and prediabetes. Followed by cardiology for her CAD. Does have 80% LAD lesion that is being treated medically since she is not having any angina.   Hypertension: Here for follow up of hypertension.  Currently on hydralazine and maxzide. Takes medication as prescribed and denies any side effects. Exercise includes none. Weight has been stable. Denies any chest pain, headaches, shortness of breath, vision changes, swelling in lower extremities.   Prediabetes: last a1c was in 05/2018 and was 6.3. no longer take her metformin. She does not want this drug due to possible cancer link. She does not follow a diabetic diet and does not do any exercise.   Vit. D deficiency: due for lab work today   Hyperlipidemia: on crestor and recent cholesterol extremely well controlled. Just seen by cardiology.   Review of Systems  Constitutional: Negative for chills, fatigue and fever.  HENT: Negative for dental problem, ear pain, hearing loss and trouble swallowing.   Eyes: Negative for visual disturbance.  Respiratory: Negative for cough, chest tightness and shortness of breath.   Cardiovascular: Negative for chest pain, palpitations and leg swelling.  Gastrointestinal: Negative for abdominal pain, blood in stool, constipation, diarrhea, nausea and vomiting.  Endocrine: Negative for cold intolerance, polydipsia, polyphagia and polyuria.  Genitourinary: Negative for dysuria and hematuria.  Musculoskeletal: Negative for arthralgias.  Skin: Negative for rash.  Neurological: Negative for dizziness and headaches.  Psychiatric/Behavioral: Negative for dysphoric mood and sleep disturbance. The patient is not nervous/anxious.      Allergies Patient is allergic to lisinopril and codeine.  Past Medical History Patient  has a past medical history of Cancer ([place]), Chicken pox, Chronic lower back pain, Diabetes ([place]), GERD (gastroesophageal reflux disease), Hepatitis (1970's), History of recurrent UTIs, Hyperlipemia, Slow heart rate (dx'd 09/24/2014), and Urine incontinence.  Surgical History Patient  has a past surgical history that includes Tonsillectomy; Appendectomy; Abdominal hysterectomy (1984); Mastectomy (Right, 1996); Breast biopsy (Right, 1996); Breast lumpectomy (Right, 1996); and LEFT HEART CATH AND CORONARY ANGIOGRAPHY (N/A, 01/13/2019).  Family History Pateint's family history includes Arthritis in her brother; Cancer in her mother; Early death in her mother.  Social History Patient  reports that she has quit smoking. Her smoking use included cigarettes. She has a 35.25 pack-year smoking history. She has never used smokeless tobacco. She reports that she does not drink alcohol or use drugs.    Objective: Vitals:   04/05/19 1259  BP: 124/80  Pulse: 72  Temp: 97.8 F (36.6 C)  TempSrc: Skin  SpO2: 95%  Weight: 291 lb 9.6 oz (132.3 kg)  Height: 5\' 6"  (1.676 m)    Body mass index is 47.07 kg/m.  Physical Exam Vitals reviewed.  Constitutional:      Appearance: She is obese.     Comments: Tobacco odor  HENT:     Head: Normocephalic and atraumatic.     Right Ear: Tympanic membrane normal.     Left Ear: Tympanic membrane and ear canal normal.  Neurological:     Mental Status: She is alert.    Depression screen [facility] 2/9 04/05/2019 09/19/2018 06/08/2018 06/07/2018 12/22/2017  Decreased Interest 0 0 0 0 0  Down, Depressed, Hopeless 0 0 0 0 0  PHQ - 2 Score 0 0 0 0 0  Altered sleeping - 0 1 1 -  Tired, decreased energy - 0 0 0 -  Change in appetite - 0 2 2 -  Feeling bad or failure about yourself  - 0 0 0 -  Trouble concentrating - 0 0 0 -  Moving slowly or fidgety/restless - 0 0 0 -  Suicidal  thoughts - 0 0 0 -  PHQ-9 Score - 0 3 3 -  Difficult doing work/chores - Not difficult at all Not difficult at all Not difficult at all -       Assessment/plan:  1. Essential hypertension Blood pressure is to goal. Continue current anti-hypertensive medications. Refills not given and routine lab work will be done today. Recommended routine exercise and healthy diet including DASH diet and mediterranean diet. Encouraged weight loss. F/u in 6 months.   - CBC with Differential/Platelet - Comprehensive metabolic panel - Microalbumin / creatinine urine ratio  2. Prediabetes Will check today and see how she is doing. Has been decently controlled on diet alone. Declines any metformin.  - Hemoglobin A1c  3. Vitamin D deficiency  - VITAMIN D 25 Hydroxy (Vit-D Deficiency, Fractures)  4. History of tobacco abuse Stopped smoking in October cold [place]. Very proud of her.    This visit occurred during the SARS-CoV-2 public health emergency.  Safety protocols were in place, including screening questions prior to the visit, additional usage of staff PPE, and extensive cleaning of exam room while observing appropriate contact time as indicated for disinfecting solutions.     Return in about 6 months (around 10/04/2019) for htn, prediabetes, fasting labs. [name], MD [place]   04/05/2019

## 2019-04-10 ENCOUNTER — Other Ambulatory Visit: Payer: Self-pay | Admitting: Family Medicine

## 2019-04-10 MED ORDER — LINAGLIPTIN 5 MG PO TABS: 5.0000 mg | ORAL_TABLET | Freq: Every day | ORAL | 3 refills | Status: DC

## 2019-04-10 MED ORDER — VITAMIN D (ERGOCALCIFEROL) 1.25 MG (50000 UNIT) PO CAPS: ORAL_CAPSULE | ORAL | 0 refills | Status: DC

## 2019-04-10 NOTE — Progress Notes (Signed)
vit

## 2019-04-13 ENCOUNTER — Telehealth: Payer: Self-pay | Admitting: Family Medicine

## 2019-04-13 NOTE — Telephone Encounter (Signed)
FYI

## 2019-04-13 NOTE — Telephone Encounter (Signed)
Pt called and stated Dr. Rogers Blocker prescribed Vitamin D pills for her but they were too expensive. Pt was able to buy some vitamin D 2000 mg pills. Pt asked to relay information to Dr. Rogers Blocker team.

## 2019-04-24 ENCOUNTER — Ambulatory Visit: Payer: Medicaid Other | Admitting: Podiatry

## 2019-04-25 ENCOUNTER — Other Ambulatory Visit: Payer: Self-pay

## 2019-04-25 ENCOUNTER — Ambulatory Visit (INDEPENDENT_AMBULATORY_CARE_PROVIDER_SITE_OTHER): Payer: Medicare Other

## 2019-04-25 DIAGNOSIS — Z0000 Encounter for general adult medical examination without abnormal findings: Secondary | ICD-10-CM | POA: Diagnosis not present

## 2019-04-25 NOTE — Patient Instructions (Signed)
[name] Cooper , Thank you for taking time to come for your Medicare Wellness Visit. I appreciate your ongoing commitment to your health goals. Please review the following plan we discussed and let me know if I can assist you in the future.   Screening recommendations/referrals: Colorectal Screening: up to date; last colonoscopy 09/30/15 with Dr. Henrene Pastor, repeat due 2027 Mammogram: up to date; last 11/10/18 Bone Density: up to date; last 06/20/18  Vision and Dental Exams: Recommended annual ophthalmology exams for early detection of glaucoma and other disorders of the eye Recommended annual dental exams for proper oral hygiene  Diabetic Exams: Diabetic Eye Exam: recommended yearly; up to date Diabetic Foot Exam: recommended yearly; upcoming appointment with Dr. Elisha Ponder   Vaccinations: Influenza vaccine: completed 02/06/19 Pneumococcal vaccine: up to date; last 03/17/16 Tdap vaccine: recommended;  Please call your insurance company to determine your out of pocket expense. You may receive this vaccine at your local pharmacy or Health Dept. Shingles vaccine: Please call your insurance company to determine your out of pocket expense for the Shingrix vaccine. You may receive this vaccine at your local pharmacy. (please review attached information)   Advanced directives:  I have provided a copy for you to complete at home and have notarized. Once this is complete please bring a copy in to our office so we can scan it into your chart.  Goals: Recommend to drink at least 6-8 8oz glasses of water per day and consume a balanced diet rich in fresh fruits and vegetables.  Next appointment: Please schedule your Annual Wellness Visit with your Nurse Health Advisor in one year.  Preventive Care 35 Years and Older, Female Preventive care refers to lifestyle choices and visits with your health care provider that can promote health and wellness. What does preventive care include?  A yearly physical exam. This  is also called an annual well check.  Dental exams once or twice a year.  Routine eye exams. Ask your health care provider how often you should have your eyes checked.  Personal lifestyle choices, including:  Daily care of your teeth and gums.  Regular physical activity.  Eating a healthy diet.  Avoiding tobacco and drug use.  Limiting alcohol use.  Practicing safe sex.  Taking low-dose aspirin every day if recommended by your health care provider.  Taking vitamin and mineral supplements as recommended by your health care provider. What happens during an annual well check? The services and screenings done by your health care provider during your annual well check will depend on your age, overall health, lifestyle risk factors, and family history of disease. Counseling  Your health care provider may ask you questions about your:  Alcohol use.  Tobacco use.  Drug use.  Emotional well-being.  Home and relationship well-being.  Sexual activity.  Eating habits.  History of falls.  Memory and ability to understand (cognition).  Work and work Statistician.  Reproductive health. Screening  You may have the following tests or measurements:  Height, weight, and BMI.  Blood pressure.  Lipid and cholesterol levels. These may be checked every 5 years, or more frequently if you are over 79 years old.  Skin check.  Lung cancer screening. You may have this screening every year starting at age 33 if you have a 30-pack-year history of smoking and currently smoke or have quit within the past 15 years.  Fecal occult blood test (FOBT) of the stool. You may have this test every year starting at age 53.  Flexible  sigmoidoscopy or colonoscopy. You may have a sigmoidoscopy every 5 years or a colonoscopy every 10 years starting at age 80.  Hepatitis C blood test.  Hepatitis B blood test.  Sexually transmitted disease (STD) testing.  Diabetes screening. This is done by  checking your blood sugar (glucose) after you have not eaten for a while (fasting). You may have this done every 1-3 years.  Bone density scan. This is done to screen for osteoporosis. You may have this done starting at age 69.  Mammogram. This may be done every 1-2 years. Talk to your health care provider about how often you should have regular mammograms. Talk with your health care provider about your test results, treatment options, and if necessary, the need for more tests. Vaccines  Your health care provider may recommend certain vaccines, such as:  Influenza vaccine. This is recommended every year.  Tetanus, diphtheria, and acellular pertussis (Tdap, Td) vaccine. You may need a Td booster every 10 years.  Zoster vaccine. You may need this after age 45.  Pneumococcal 13-valent conjugate (PCV13) vaccine. One dose is recommended after age 28.  Pneumococcal polysaccharide (PPSV23) vaccine. One dose is recommended after age 64. Talk to your health care provider about which screenings and vaccines you need and how often you need them. This information is not intended to replace advice given to you by your health care provider. Make sure you discuss any questions you have with your health care provider. Document Released: 05/03/2015 Document Revised: 12/25/2015 Document Reviewed: 02/05/2015 Elsevier Interactive Patient Education  2017 [employer] Prevention in the Home Falls can cause injuries. They can happen to people of all ages. There are many things you can do to make your home safe and to help prevent falls. What can I do on the outside of my home?  Regularly fix the edges of walkways and driveways and fix any cracks.  Remove anything that might make you trip as you walk through a door, such as a raised step or threshold.  Trim any bushes or trees on the path to your home.  Use bright outdoor lighting.  Clear any walking paths of anything that might make someone trip,  such as rocks or tools.  Regularly check to see if handrails are loose or broken. Make sure that both sides of any steps have handrails.  Any raised decks and porches should have guardrails on the edges.  Have any leaves, snow, or ice cleared regularly.  Use sand or salt on walking paths during winter.  Clean up any spills in your garage right away. This includes oil or grease spills. What can I do in the bathroom?  Use night lights.  Install grab bars by the toilet and in the tub and shower. Do not use towel bars as grab bars.  Use non-skid mats or decals in the tub or shower.  If you need to sit down in the shower, use a plastic, non-slip stool.  Keep the floor dry. Clean up any water that spills on the floor as soon as it happens.  Remove soap buildup in the tub or shower regularly.  Attach bath mats securely with double-sided non-slip rug tape.  Do not have throw rugs and other things on the floor that can make you trip. What can I do in the bedroom?  Use night lights.  Make sure that you have a light by your bed that is easy to reach.  Do not use any sheets or blankets that  are too big for your bed. They should not hang down onto the floor.  Have a firm chair that has side arms. You can use this for support while you get dressed.  Do not have throw rugs and other things on the floor that can make you trip. What can I do in the kitchen?  Clean up any spills right away.  Avoid walking on wet floors.  Keep items that you use a lot in easy-to-reach places.  If you need to reach something above you, use a strong step stool that has a grab bar.  Keep electrical cords out of the way.  Do not use floor polish or wax that makes floors slippery. If you must use wax, use non-skid floor wax.  Do not have throw rugs and other things on the floor that can make you trip. What can I do with my stairs?  Do not leave any items on the stairs.  Make sure that there are  handrails on both sides of the stairs and use them. Fix handrails that are broken or loose. Make sure that handrails are as long as the stairways.  Check any carpeting to make sure that it is firmly attached to the stairs. Fix any carpet that is loose or worn.  Avoid having throw rugs at the top or bottom of the stairs. If you do have throw rugs, attach them to the floor with carpet tape.  Make sure that you have a light switch at the top of the stairs and the bottom of the stairs. If you do not have them, ask someone to add them for you. What else can I do to help prevent falls?  Wear shoes that:  Do not have high heels.  Have rubber bottoms.  Are comfortable and fit you well.  Are closed at the toe. Do not wear sandals.  If you use a stepladder:  Make sure that it is fully opened. Do not climb a closed stepladder.  Make sure that both sides of the stepladder are locked into place.  Ask someone to hold it for you, if possible.  Clearly mark and make sure that you can see:  Any grab bars or handrails.  First and last steps.  Where the edge of each step is.  Use tools that help you move around (mobility aids) if they are needed. These include:  Canes.  Walkers.  Scooters.  Crutches.  Turn on the lights when you go into a dark area. Replace any light bulbs as soon as they burn out.  Set up your furniture so you have a clear path. Avoid moving your furniture around.  If any of your floors are uneven, fix them.  If there are any pets around you, be aware of where they are.  Review your medicines with your doctor. Some medicines can make you feel dizzy. This can increase your chance of falling. Ask your doctor what other things that you can do to help prevent falls. This information is not intended to replace advice given to you by your health care provider. Make sure you discuss any questions you have with your health care provider. Document Released: 01/31/2009  Document Revised: 09/12/2015 Document Reviewed: 05/11/2014 Elsevier Interactive Patient Education  2017 [employer].

## 2019-04-25 NOTE — Progress Notes (Addendum)
This visit is being conducted via phone call due to the COVID-19 pandemic. This patient has given me verbal consent via phone to conduct this visit, patient states they are participating from their home address. Some vital signs may be absent or patient reported.   Patient identification: identified by name, DOB, and current address.  Location provider: [facility], Office Persons participating in the virtual visit: Denman George LPN, patient, and Dr. Billey Chang     Subjective:   [name] Cooper is a 69 y.o. female who presents for an Initial Medicare Annual Wellness Visit.  Review of Systems     Cardiac Risk Factors include: advanced age (>55men, >53 women);diabetes mellitus;hypertension    Objective:    There were no vitals filed for this visit. There is no height or weight on file to calculate BMI.  Advanced Directives 04/25/2019 01/13/2019 02/10/2017 10/08/2016 03/17/2016 12/16/2015 09/13/2015  Does Patient Have a Medical Advance Directive? No No No No No No No  Would patient like information on creating a medical advance directive? Yes (MAU/Ambulatory/Procedural Areas - Information given) No - Patient declined - - - No - patient declined information No - patient declined information    Current Medications (verified) Outpatient Encounter Medications as of 04/25/2019  Medication Sig  . acetaminophen (TYLENOL) 650 MG CR tablet Take 1,300 mg by mouth every 8 (eight) hours as needed for pain. Reported on 09/23/2015  . albuterol (PROVENTIL HFA;VENTOLIN HFA) 108 (90 Base) MCG/ACT inhaler Inhale 2 puffs into the lungs every 6 (six) hours as needed for wheezing or shortness of breath.  Marland Kitchen aspirin EC 81 MG tablet Take 1 tablet (81 mg total) by mouth daily.  . ferrous sulfate 325 (65 FE) MG tablet Take 325 mg by mouth 2 (two) times daily.  . hydrALAZINE (APRESOLINE) 25 MG tablet TAKE 1 TABLET BY MOUTH 3  TIMES DAILY  . isosorbide mononitrate (IMDUR) 30 MG 24 hr tablet Take 1 tablet (30 mg  total) by mouth daily.  Marland Kitchen linagliptin (TRADJENTA) 5 MG TABS tablet Take 1 tablet (5 mg total) by mouth daily.  . Multiple Vitamin (MULTIVITAMIN WITH MINERALS) TABS tablet Take 1 tablet by mouth daily.  . nitroGLYCERIN (NITROSTAT) 0.4 MG SL tablet Place 1 tablet (0.4 mg total) under the tongue every 5 (five) minutes as needed for chest pain.  . rosuvastatin (CRESTOR) 20 MG tablet Take 1 tablet (20 mg total) by mouth daily.  Marland Kitchen triamterene-hydrochlorothiazide (MAXZIDE-25) 37.5-25 MG tablet Take 1 tablet by mouth daily.  . Vitamin D, Ergocalciferol, (DRISDOL) 1.25 MG (50000 UT) CAPS capsule One capsule by mouth once a week for 12 weeks. Then take 2000IU/day   No facility-administered encounter medications on file as of 04/25/2019.    Allergies (verified) Lisinopril and Codeine   History: Past Medical History:  Diagnosis Date  . Cancer ([place])   . Chicken pox   . Chronic lower back pain   . Diabetes ([place])   . GERD (gastroesophageal reflux disease)   . Hepatitis 1970's  . History of recurrent UTIs   . Hyperlipemia   . Slow heart rate dx'd 09/24/2014  . Urine incontinence    Past Surgical History:  Procedure Laterality Date  . ABDOMINAL HYSTERECTOMY  1984  . APPENDECTOMY    . BREAST BIOPSY Right 1996  . BREAST LUMPECTOMY Right 1996  . LEFT HEART CATH AND CORONARY ANGIOGRAPHY N/A 01/13/2019   Procedure: LEFT HEART CATH AND CORONARY ANGIOGRAPHY;  Surgeon: Nelva Bush, MD;  Location: [facility];  Service:  Cardiovascular;  Laterality: N/A;  . MASTECTOMY Right 1996  . TONSILLECTOMY     Family History  Problem Relation Age of Onset  . Cancer Mother   . Early death Mother   . Arthritis Brother   . Colon cancer Neg Hx    Social History   Socioeconomic History  . Marital status: Widowed    Spouse name: Not on file  . Number of children: 1  . Years of education: Not on file  . Highest education level: Not on file  Occupational History  . Occupation: Retired     Fish farm manager:  [employer]  Tobacco Use  . Smoking status: Former Smoker    Packs/day: 0.75    Years: 47.00    Pack years: 35.25    Types: Cigarettes  . Smokeless tobacco: Never Used  . Tobacco comment: quit date: 01/19/2019  Substance and Sexual Activity  . Alcohol use: No    Alcohol/week: 0.0 standard drinks  . Drug use: No    Types: "Crack" cocaine    Comment: 09/24/2014 "last crack was in 2013"  . Sexual activity: Not on file  Other Topics Concern  . Not on file  Social History Narrative   3 grand children    9 great grandchildren    Social Determinants of Health   Financial Resource Strain:   . Difficulty of Paying Living Expenses: Not on file  Food Insecurity:   . Worried About Charity fundraiser in the Last Year: Not on file  . Ran Out of Food in the Last Year: Not on file  Transportation Needs:   . Lack of Transportation (Medical): Not on file  . Lack of Transportation (Non-Medical): Not on file  Physical Activity:   . Days of Exercise per Week: Not on file  . Minutes of Exercise per Session: Not on file  Stress:   . Feeling of Stress : Not on file  Social Connections:   . Frequency of Communication with Friends and Family: Not on file  . Frequency of Social Gatherings with Friends and Family: Not on file  . Attends Religious Services: Not on file  . Active Member of Clubs or Organizations: Not on file  . Attends Archivist Meetings: Not on file  . Marital Status: Not on file    Tobacco Counseling Counseling given: Not Answered Comment: quit date: 01/19/2019   Clinical Intake:  Pre-visit preparation completed: Yes  Pain : No/denies pain  Diabetes: Yes CBG done?: No Did pt. bring in CBG monitor from home?: No  How often do you need to have someone help you when you read instructions, pamphlets, or other written materials from your doctor or pharmacy?: 1 - Never  Interpreter Needed?: No  Information entered by :: Denman George  LPN   Activities of Daily Living In your present state of health, do you have any difficulty performing the following activities: 04/25/2019  Hearing? N  Vision? N  Difficulty concentrating or making decisions? N  Walking or climbing stairs? N  Dressing or bathing? N  Doing errands, shopping? N  Preparing Food and eating ? N  Using the Toilet? N  In the past six months, have you accidently leaked urine? N  Do you have problems with loss of bowel control? N  Managing your Medications? N  Managing your Finances? N  Housekeeping or managing your Housekeeping? N  Some recent data might be hidden     Immunizations and Health Maintenance Immunization History  Administered  Date(s) Administered  . Fluad Quad(high Dose 65+) 02/06/2019  . Influenza,inj,Quad PF,6+ Mos 03/17/2016, 12/22/2016, 12/22/2017  . Pneumococcal Conjugate-13 03/17/2016  . Pneumococcal Polysaccharide-23 09/25/2014   Health Maintenance Due  Topic Date Due  . FOOT EXAM  06/22/2018    Patient Care Team: [name], MD as PCP - General (Family Medicine) Jerline Pain, MD as PCP - Cardiology (Cardiology) Marzetta Board, DPM as Consulting Physician (Podiatry)  Indicate any recent Medical Services you may have received from other than [facility] providers in the past year (date may be approximate).     Assessment:   This is a routine wellness examination for [name] Cooper.  Hearing/Vision screen No exam data present  Dietary issues and exercise activities discussed: Current Exercise Habits: The patient does not participate in regular exercise at present  Goals   None    Depression Screen PHQ 2/9 Scores 04/25/2019 04/05/2019 09/19/2018 06/08/2018 06/07/2018 12/22/2017 06/21/2017  PHQ - 2 Score 0 0 0 0 0 0 0  PHQ- 9 Score - - 0 3 3 - -    Fall Risk Fall Risk  04/25/2019 09/19/2018 12/22/2017 06/21/2017 12/22/2016  Falls in the past year? 0 0 No No No  Number falls in past yr: - 0 - - -  Injury with Fall? 0 0 - - -  Follow  up Falls evaluation completed;Education provided;Falls prevention discussed - - - -    Is the patient's home free of loose throw rugs in walkways, pet beds, electrical cords, etc?   yes      Grab bars in the bathroom? yes      Handrails on the stairs?   yes      Adequate lighting?   yes  Cognitive Function: no cognitive concerns at this time      6CIT Screen 04/25/2019  What Year? 0 points  What month? 0 points  What time? 0 points  Count back from 20 0 points  Months in reverse 0 points  Repeat phrase 0 points  Total Score 0    Screening Tests Health Maintenance  Topic Date Due  . FOOT EXAM  06/22/2018  . TETANUS/TDAP  09/22/2026 (Originally 12/05/1969)  . PNA vac Low Risk Adult (2 of 2 - PPSV23) 09/25/2019  . HEMOGLOBIN A1C  10/04/2019  . OPHTHALMOLOGY EXAM  02/10/2020  . MAMMOGRAM  11/09/2020  . COLONOSCOPY  09/29/2025  . INFLUENZA VACCINE  Completed  . DEXA SCAN  Completed  . Hepatitis C Screening  Completed    Qualifies for Shingles Vaccine? Discussed and patient will check with pharmacy for coverage.  Patient education handout provided   Cancer Screenings: Lung: Low Dose CT Chest recommended if Age 57-80 years, 30 pack-year currently smoking OR have quit w/in 15years. Patient does not qualify. Breast: Up to date on Mammogram? Yes   Up to date of Bone Density/Dexa? Yes Colorectal: colonoscopy 09/30/15 with Dr. Henrene Pastor; repeat in 10 years    Plan:  I have personally reviewed and addressed the Medicare Annual Wellness questionnaire and have noted the following in the patient's chart:  A. Medical and social history B. Use of alcohol, tobacco or illicit drugs  C. Current medications and supplements D. Functional ability and status E.  Nutritional status F.  Physical activity G. Advance directives H. List of other physicians I.  Hospitalizations, surgeries, and ER visits in previous 12 months J.  [place] such as hearing and vision if needed, cognitive  and depression L. Referrals, records requested, and appointments-  none   In addition, I have reviewed and discussed with patient certain preventive protocols, quality metrics, and best practice recommendations. A written personalized care plan for preventive services as well as general preventive health recommendations were provided to patient.   Signed,  Denman George, LPN  Nurse Health Advisor   Nurse Notes: no additional

## 2019-04-26 NOTE — Telephone Encounter (Signed)
She can likely get a higher dose over the counter. Would advise she talk to pharmacist.   [name] Flaming, MD [place]

## 2019-04-27 NOTE — Telephone Encounter (Signed)
Spoke with patient and advised of notes per Dr. Rogers Blocker; she verbalized understanding.

## 2019-05-11 ENCOUNTER — Other Ambulatory Visit: Payer: Medicare Other

## 2019-05-15 ENCOUNTER — Other Ambulatory Visit: Payer: Self-pay

## 2019-05-15 ENCOUNTER — Other Ambulatory Visit: Payer: Medicare Other | Admitting: *Deleted

## 2019-05-15 DIAGNOSIS — E782 Mixed hyperlipidemia: Secondary | ICD-10-CM

## 2019-05-16 LAB — LIPID PANEL
Chol/HDL Ratio: 2.3 ratio (ref 0.0–4.4)
Cholesterol, Total: 153 mg/dL (ref 100–199)
HDL: 67 mg/dL (ref >39)
LDL Chol Calc (NIH): 69 mg/dL (ref 0–99)
Triglycerides: 95 mg/dL (ref 0–149)
VLDL Cholesterol Cal: 17 mg/dL (ref 5–40)

## 2019-05-16 LAB — HEPATIC FUNCTION PANEL
ALT: 12 IU/L (ref 0–32)
AST: 18 IU/L (ref 0–40)
Albumin: 4.3 g/dL (ref 3.8–4.8)
Alkaline Phosphatase: 121 IU/L — ABNORMAL HIGH (ref 39–117)
Bilirubin Total: 0.3 mg/dL (ref 0.0–1.2)
Bilirubin, Direct: 0.12 mg/dL (ref 0.00–0.40)
Total Protein: 7.3 g/dL (ref 6.0–8.5)

## 2019-05-17 ENCOUNTER — Other Ambulatory Visit: Payer: Self-pay | Admitting: Family Medicine

## 2019-06-14 ENCOUNTER — Other Ambulatory Visit: Payer: Self-pay

## 2019-06-14 MED ORDER — TRIAMTERENE-HCTZ 37.5-25 MG PO TABS: 1.0000 | ORAL_TABLET | Freq: Every day | ORAL | 1 refills | Status: DC

## 2019-06-20 ENCOUNTER — Other Ambulatory Visit: Payer: Self-pay

## 2019-06-20 ENCOUNTER — Ambulatory Visit (INDEPENDENT_AMBULATORY_CARE_PROVIDER_SITE_OTHER): Payer: Medicare Other | Admitting: Podiatry

## 2019-06-20 VITALS — Temp 97.9°F

## 2019-06-20 DIAGNOSIS — M79675 Pain in left toe(s): Secondary | ICD-10-CM | POA: Diagnosis not present

## 2019-06-20 DIAGNOSIS — B351 Tinea unguium: Secondary | ICD-10-CM

## 2019-06-20 DIAGNOSIS — E119 Type 2 diabetes mellitus without complications: Secondary | ICD-10-CM

## 2019-06-20 DIAGNOSIS — M79674 Pain in right toe(s): Secondary | ICD-10-CM

## 2019-06-20 NOTE — Patient Instructions (Signed)

## 2019-06-22 ENCOUNTER — Encounter: Payer: Self-pay | Admitting: Podiatry

## 2019-06-22 NOTE — Progress Notes (Signed)
Subjective: [name] Cooper presents today for follow up of painful mycotic nails b/l that are difficult to trim. Pain interferes with ambulation. Aggravating factors include wearing enclosed shoe gear. Pain is relieved with periodic professional debridement.   She had b/l nail avulsions performed in 2020, but states right great toenail grew back.  Allergies  Allergen Reactions  . Lisinopril Anaphylaxis  . Codeine     REACTION: MAKES HER SLEEPY     Objective: Vitals:   06/20/19 0919  Temp: 97.9 F (36.6 C)    Vascular Examination:  Capillary refill time to digits immediate b/l. Palpable DP pulses b/l. Palpable PT pulses b/l. Pedal hair sparse b/l. Skin temperature gradient within normal limits b/l.  Dermatological Examination: Pedal skin with normal turgor, texture and tone bilaterally. No open wounds bilaterally. No interdigital macerations bilaterally. Toenails right great toe and 2-5 b/l elongated, dystrophic, thickened, crumbly with subungual debris and tenderness to dorsal palpation.  Anonychia left great toe(s) with evidence of permanent total nail avulsion. Nailbed(s) completely epithelialized and intact.  Musculoskeletal: Normal muscle strength 5/5 to all lower extremity muscle groups bilaterally, no gross bony deformities bilaterally and no pain crepitus or joint limitation noted with ROM b/l  Neurological: Protective sensation intact 5/5 intact bilaterally with 10g monofilament b/l and vibratory sensation intact b/l  Assessment: 1. Pain due to onychomycosis of toenails of both feet   2. Diabetes mellitus without complication ([place])   3. Encounter for diabetic foot exam ([place])    Plan: -Diabetic foot exam performed today.  -Continue diabetic foot care principles. Literature dispensed on today.  -Toenails right great toe and 2-5 b/l were debrided in length and girth with sterile nail nippers and dremel without iatrogenic bleeding.  -Patient to continue soft, supportive  shoe gear daily. -Patient to report any pedal injuries to medical professional immediately. -Patient/POA to call should there be question/concern in the interim.  Return in about 3 months (around 09/20/2019) for nail trim.

## 2019-07-12 ENCOUNTER — Other Ambulatory Visit: Payer: Self-pay

## 2019-07-12 ENCOUNTER — Encounter: Payer: Self-pay | Admitting: Family Medicine

## 2019-07-12 ENCOUNTER — Ambulatory Visit (INDEPENDENT_AMBULATORY_CARE_PROVIDER_SITE_OTHER): Payer: Medicare Other | Admitting: Family Medicine

## 2019-07-12 VITALS — BP 130/74 | HR 70 | Temp 97.3°F | Ht 66.0 in | Wt 293.8 lb

## 2019-07-12 DIAGNOSIS — N179 Acute kidney failure, unspecified: Secondary | ICD-10-CM

## 2019-07-12 DIAGNOSIS — R300 Dysuria: Secondary | ICD-10-CM | POA: Diagnosis not present

## 2019-07-12 DIAGNOSIS — D509 Iron deficiency anemia, unspecified: Secondary | ICD-10-CM

## 2019-07-12 DIAGNOSIS — N3000 Acute cystitis without hematuria: Secondary | ICD-10-CM | POA: Diagnosis not present

## 2019-07-12 DIAGNOSIS — E782 Mixed hyperlipidemia: Secondary | ICD-10-CM

## 2019-07-12 DIAGNOSIS — E559 Vitamin D deficiency, unspecified: Secondary | ICD-10-CM

## 2019-07-12 DIAGNOSIS — R7303 Prediabetes: Secondary | ICD-10-CM

## 2019-07-12 DIAGNOSIS — E119 Type 2 diabetes mellitus without complications: Secondary | ICD-10-CM

## 2019-07-12 LAB — CBC WITH DIFFERENTIAL/PLATELET
Basophils Absolute: 0.1 10*3/uL (ref 0.0–0.1)
Basophils Relative: 1.1 % (ref 0.0–3.0)
Eosinophils Absolute: 0.7 10*3/uL (ref 0.0–0.7)
Eosinophils Relative: 6.8 % — ABNORMAL HIGH (ref 0.0–5.0)
HCT: 32.8 % — ABNORMAL LOW (ref 36.0–46.0)
Hemoglobin: 10.2 g/dL — ABNORMAL LOW (ref 12.0–15.0)
Lymphocytes Relative: 37.5 % (ref 12.0–46.0)
Lymphs Abs: 3.7 10*3/uL (ref 0.7–4.0)
MCHC: 31.1 g/dL (ref 30.0–36.0)
MCV: 66 fl — ABNORMAL LOW (ref 78.0–100.0)
Monocytes Absolute: 1 10*3/uL (ref 0.1–1.0)
Monocytes Relative: 9.7 % (ref 3.0–12.0)
Neutro Abs: 4.4 10*3/uL (ref 1.4–7.7)
Neutrophils Relative %: 44.9 % (ref 43.0–77.0)
Platelets: 308 10*3/uL (ref 150.0–400.0)
RBC: 4.97 Mil/uL (ref 3.87–5.11)
RDW: 18.2 % — ABNORMAL HIGH (ref 11.5–15.5)
WBC: 9.9 10*3/uL (ref 4.0–10.5)

## 2019-07-12 LAB — COMPREHENSIVE METABOLIC PANEL
ALT: 8 U/L (ref 0–35)
AST: 13 U/L (ref 0–37)
Albumin: 3.9 g/dL (ref 3.5–5.2)
Alkaline Phosphatase: 93 U/L (ref 39–117)
BUN: 15 mg/dL (ref 6–23)
CO2: 30 mEq/L (ref 19–32)
Calcium: 9.1 mg/dL (ref 8.4–10.5)
Chloride: 101 mEq/L (ref 96–112)
Creatinine, Ser: 1.32 mg/dL — ABNORMAL HIGH (ref 0.40–1.20)
GFR: 48.2 mL/min — ABNORMAL LOW (ref >60.00)
Glucose, Bld: 100 mg/dL — ABNORMAL HIGH (ref 70–99)
Potassium: 3.8 mEq/L (ref 3.5–5.1)
Sodium: 139 mEq/L (ref 135–145)
Total Bilirubin: 0.5 mg/dL (ref 0.2–1.2)
Total Protein: 7.4 g/dL (ref 6.0–8.3)

## 2019-07-12 LAB — POCT URINALYSIS DIPSTICK
Bilirubin, UA: NEGATIVE
Blood, UA: NEGATIVE
Glucose, UA: NEGATIVE
Ketones, UA: NEGATIVE
Nitrite, UA: POSITIVE
Protein, UA: NEGATIVE
Spec Grav, UA: 1.015 (ref 1.010–1.025)
Urobilinogen, UA: 0.2 E.U./dL
pH, UA: 6 (ref 5.0–8.0)

## 2019-07-12 LAB — HEMOGLOBIN A1C: Hgb A1c MFr Bld: 7.1 % — ABNORMAL HIGH (ref 4.6–6.5)

## 2019-07-12 LAB — VITAMIN D 25 HYDROXY (VIT D DEFICIENCY, FRACTURES): VITD: 22.82 ng/mL — ABNORMAL LOW (ref 30.00–100.00)

## 2019-07-12 MED ORDER — CEFUROXIME AXETIL 500 MG PO TABS: 500.0000 mg | ORAL_TABLET | Freq: Two times a day (BID) | ORAL | 0 refills | Status: DC

## 2019-07-12 MED ORDER — VITAMIN D (ERGOCALCIFEROL) 1.25 MG (50000 UNIT) PO CAPS: ORAL_CAPSULE | ORAL | 0 refills | Status: DC

## 2019-07-12 NOTE — Patient Instructions (Signed)
Diabetes: checking labs today. i'll let you know if we need to change anything. Keep up diet/exercise.   You do have a urine infection: sending in antibiotic for you to take that is safe with your kidneys.   Checking vitamin D and iron again.   Everything else looks good.   See you back in 3-6 months,  Dr. Rogers Blocker

## 2019-07-12 NOTE — Progress Notes (Signed)
Patient: [name] Cooper MRN: [id number] DOB: 04-08-1951 PCP: [name], MD     Subjective:  Chief Complaint  Patient presents with  . Diabetes  . Hyperlipidemia  . Dysuria  . vitamin d deficiency  . aki  . microcytic anemia    HPI: The patient is a 69 y.o. female who presents today for follow up of chronic issues and new acute issue of dysuria and bladder pain.   Diabetes: diagnosed from prediabetes to diabetes last December. Stopped her metformin due to increased creatinine and started her on tradjenta. Her a1c was 6.7. she has had no issues with the new medication. Not exercising, losing weight or following diabetic diet.   Vitamin D: due for recheck after we repleted her. She couldn't afford the px vitamin D and has been taking vitamin D otc twice a day. She doesn't remember the dosage.   Hyperlipidemia: currently on crestor 20mg . Lipid panel checked in January and to goal.   Difficulty urinating: she states she has a little bit burning to urinate. Her biggest complaint is that it takes a long time to get urine out. She has to sit on toilet for 10-15 minutes to get urine out. She also has increased frequency, but no urgency. NO visible blood in her urine, but has a smell. She drinks plenty of water. She is having pain in her bladder and in her back. No fever/chills. No N/V/D. She has taken tylenol PM. Has not had sex in 48 years. Last Uti was 9 months ago.   Review of Systems  Constitutional: Negative for chills, fatigue and fever.  HENT: Negative for sinus pressure, sinus pain and sore throat.   Respiratory: Negative for cough, shortness of breath and wheezing.   Cardiovascular: Negative for chest pain and palpitations.  Gastrointestinal: Positive for abdominal pain. Negative for nausea and vomiting.  Genitourinary: Positive for pelvic pain and urgency.  Musculoskeletal: Positive for back pain.  Neurological: Negative for dizziness, light-headedness and headaches.     Allergies Patient is allergic to lisinopril and codeine.  Past Medical History Patient  has a past medical history of Cancer ([place]), Chicken pox, Chronic lower back pain, Diabetes ([place]), GERD (gastroesophageal reflux disease), Hepatitis (1970's), History of recurrent UTIs, Hyperlipemia, Slow heart rate (dx'd 09/24/2014), and Urine incontinence.  Surgical History Patient  has a past surgical history that includes Tonsillectomy; Appendectomy; Abdominal hysterectomy (1984); Mastectomy (Right, 1996); Breast biopsy (Right, 1996); Breast lumpectomy (Right, 1996); and LEFT HEART CATH AND CORONARY ANGIOGRAPHY (N/A, 01/13/2019).  Family History Pateint's family history includes Arthritis in her brother; Cancer in her mother; Early death in her mother.  Social History Patient  reports that she has quit smoking. Her smoking use included cigarettes. She has a 35.25 pack-year smoking history. She has never used smokeless tobacco. She reports that she does not drink alcohol or use drugs.    Objective: Vitals:   07/12/19 1058  BP: 130/74  Pulse: 70  Temp: (!) 97.3 F (36.3 C)  TempSrc: Temporal  SpO2: 95%  Weight: 293 lb 12.8 oz (133.3 kg)  Height: 5\' 6"  (1.676 m)    Body mass index is 47.42 kg/m.  Physical Exam Vitals reviewed.  Constitutional:      Appearance: Normal appearance. She is obese.     Comments: Tobacco odor   HENT:     Head: Normocephalic and atraumatic.  Cardiovascular:     Rate and Rhythm: Normal rate and regular rhythm.     Heart sounds: Normal heart sounds.  Pulmonary:  Effort: Pulmonary effort is normal.     Breath sounds: Normal breath sounds.  Abdominal:     General: Bowel sounds are normal.     Palpations: Abdomen is soft.     Tenderness: There is abdominal tenderness (suprapubic ). There is right CVA tenderness. There is no left CVA tenderness.  Musculoskeletal:     Cervical back: Normal range of motion and neck supple.  Neurological:     General: No  focal deficit present.     Mental Status: She is alert and oriented to person, place, and time.  Psychiatric:        Mood and Affect: Mood normal.        Behavior: Behavior normal.   UA: +nitirite and LE     Assessment/plan: 1. Controlled type 2 diabetes mellitus without complication, without long-term current use of insulin (HCC) Check a1c today. I stopped metformin and started her on tradjenta. She is not following a diabetic diet or trying to lose weight or exercise. Has been decently controlled in the past. Will see how she is doing on the tradjenta and go from there. On statin. Can not do acei/arb due to anaphylaxis. utd on HM. F/u in 3-6 months.  - Hemoglobin A1c  2. Vitamin D deficiency Did not pick up high dose repletion. States she can afford this now if needed.  - VITAMIN D 25 Hydroxy (Vit-D Deficiency, Fractures)  3. AKI (acute kidney injury) ([place]) metformin stopped 3 months ago. aki vs. Ckd. Repeat today.  - Comprehensive metabolic panel - CBC with Differential/Platelet  4. Acute cystitis without hematuria UA suspicious for infections with clinical symptoms. Treating with course of ceftin. Culture pending. Precautions given. Has some CVA tenderness but no fever and no white count.  - POCT urinalysis dipstick - Urine Culture  5. Mixed hyperlipidemia To goal on her statin. Continue crestor.   6. Microcytic anemia Repeat cbc today. States she has been taking her iron and I increased her to BID. Can not afford an infusion, but may ned this or referral to hematology if low again.   This visit occurred during the SARS-CoV-2 public health emergency.  Safety protocols were in place, including screening questions prior to the visit, additional usage of staff PPE, and extensive cleaning of exam room while observing appropriate contact time as indicated for disinfecting solutions.     Return in about 6 months (around 01/12/2020), or if symptoms worsen or fail to  improve.   [name], MD [place]   07/12/2019

## 2019-07-14 ENCOUNTER — Other Ambulatory Visit: Payer: Self-pay | Admitting: Family Medicine

## 2019-07-14 DIAGNOSIS — D509 Iron deficiency anemia, unspecified: Secondary | ICD-10-CM

## 2019-07-14 LAB — URINE CULTURE
MICRO NUMBER:: 10288372
SPECIMEN QUALITY:: ADEQUATE

## 2019-07-14 MED ORDER — VITAMIN D (ERGOCALCIFEROL) 1.25 MG (50000 UNIT) PO CAPS: ORAL_CAPSULE | ORAL | 0 refills | Status: DC

## 2019-07-17 ENCOUNTER — Telehealth: Payer: Self-pay | Admitting: Physician Assistant

## 2019-07-17 NOTE — Telephone Encounter (Signed)
Received a new hem referral from Dr. Rogers Blocker for microcytic anemia. Pt has been cld and scheduled to see Cassie on 4/12 at 1:30pm w/labs at 1pm. Pt aware to arrive 15 minutes early.

## 2019-07-28 ENCOUNTER — Other Ambulatory Visit: Payer: Self-pay | Admitting: Physician Assistant

## 2019-07-28 DIAGNOSIS — D509 Iron deficiency anemia, unspecified: Secondary | ICD-10-CM

## 2019-07-31 ENCOUNTER — Inpatient Hospital Stay: Payer: Medicare Other

## 2019-07-31 ENCOUNTER — Other Ambulatory Visit: Payer: Self-pay | Admitting: Physician Assistant

## 2019-07-31 ENCOUNTER — Encounter: Payer: Self-pay | Admitting: Physician Assistant

## 2019-07-31 ENCOUNTER — Other Ambulatory Visit: Payer: Self-pay

## 2019-07-31 ENCOUNTER — Inpatient Hospital Stay: Payer: Medicare Other | Attending: Physician Assistant | Admitting: Physician Assistant

## 2019-07-31 VITALS — BP 149/80 | HR 71 | Temp 98.7°F | Resp 17 | Ht 66.0 in | Wt 296.6 lb

## 2019-07-31 DIAGNOSIS — D509 Iron deficiency anemia, unspecified: Secondary | ICD-10-CM | POA: Diagnosis not present

## 2019-07-31 LAB — CMP (CANCER CENTER ONLY)
ALT: 8 U/L (ref 0–44)
AST: 14 U/L — ABNORMAL LOW (ref 15–41)
Albumin: 3.4 g/dL — ABNORMAL LOW (ref 3.5–5.0)
Alkaline Phosphatase: 93 U/L (ref 38–126)
Anion gap: 7 (ref 5–15)
BUN: 14 mg/dL (ref 8–23)
CO2: 30 mmol/L (ref 22–32)
Calcium: 9.1 mg/dL (ref 8.9–10.3)
Chloride: 104 mmol/L (ref 98–111)
Creatinine: 1.32 mg/dL — ABNORMAL HIGH (ref 0.44–1.00)
GFR, Est AFR Am: 48 mL/min — ABNORMAL LOW (ref >60)
GFR, Estimated: 41 mL/min — ABNORMAL LOW (ref >60)
Glucose, Bld: 100 mg/dL — ABNORMAL HIGH (ref 70–99)
Potassium: 4 mmol/L (ref 3.5–5.1)
Sodium: 141 mmol/L (ref 135–145)
Total Bilirubin: 0.4 mg/dL (ref 0.3–1.2)
Total Protein: 7.8 g/dL (ref 6.5–8.1)

## 2019-07-31 LAB — CBC WITH DIFFERENTIAL (CANCER CENTER ONLY)
Abs Immature Granulocytes: 0.02 10*3/uL (ref 0.00–0.07)
Basophils Absolute: 0.1 10*3/uL (ref 0.0–0.1)
Basophils Relative: 1 %
Eosinophils Absolute: 0.7 10*3/uL — ABNORMAL HIGH (ref 0.0–0.5)
Eosinophils Relative: 7 %
HCT: 33.2 % — ABNORMAL LOW (ref 36.0–46.0)
Hemoglobin: 9.7 g/dL — ABNORMAL LOW (ref 12.0–15.0)
Immature Granulocytes: 0 %
Lymphocytes Relative: 36 %
Lymphs Abs: 3.4 10*3/uL (ref 0.7–4.0)
MCH: 19.8 pg — ABNORMAL LOW (ref 26.0–34.0)
MCHC: 29.2 g/dL — ABNORMAL LOW (ref 30.0–36.0)
MCV: 67.6 fL — ABNORMAL LOW (ref 80.0–100.0)
Monocytes Absolute: 0.8 10*3/uL (ref 0.1–1.0)
Monocytes Relative: 8 %
Neutro Abs: 4.6 10*3/uL (ref 1.7–7.7)
Neutrophils Relative %: 48 %
Platelet Count: 293 10*3/uL (ref 150–400)
RBC: 4.91 MIL/uL (ref 3.87–5.11)
RDW: 18.7 % — ABNORMAL HIGH (ref 11.5–15.5)
WBC Count: 9.5 10*3/uL (ref 4.0–10.5)
nRBC: 0 % (ref 0.0–0.2)

## 2019-07-31 LAB — IRON AND TIBC
Iron: 24 ug/dL — ABNORMAL LOW (ref 41–142)
Saturation Ratios: 7 % — ABNORMAL LOW (ref 21–57)
TIBC: 346 ug/dL (ref 236–444)
UIBC: 323 ug/dL (ref 120–384)

## 2019-07-31 LAB — LACTATE DEHYDROGENASE: LDH: 178 U/L (ref 98–192)

## 2019-07-31 LAB — FERRITIN: Ferritin: <4 ng/mL — ABNORMAL LOW (ref 11–307)

## 2019-07-31 MED ORDER — INTEGRA PLUS PO CAPS: 1.0000 | ORAL_CAPSULE | Freq: Every morning | ORAL | 2 refills | Status: DC

## 2019-07-31 NOTE — Progress Notes (Signed)
[place] [phone]   [phone]  CONSULT NOTE  REFERRING PHYSICIAN: Dr. Rogers Blocker  REASON FOR CONSULTATION:  Microcytic Anemia  HPI [name] Cooper is a 69 y.o. female with past medical history for hysterectomy in 1984, right lumpectomy for breast cancer and 1996, diabetes, GERD, hyperlipidemia, hepatitis ~"50 years ago", hypertension, and tobacco abuse is referred to the clinic for evaluation of microcytic anemia.   The patient was first noted to be anemic approximately 6-7 months ago. She was instructed to take OTC 325 ferrous sulfate at that time. She states she had been compliant with her iron up until about 1 month ago. She stopped secondary to frequent constipation. She has never required a blood transfusion or iron infusion.  The patient was referred to the clinic after she saw her primary care provider again on July 12, 2019 for a routine follow-up visit of multiple chronic issues as well as a urinary tract infection. She had a CBC performed which showed persistent microcytic anemia with a hemoglobin of 10.2, MCV of 66 and an elevated RDW 18.2. Platelets and WBCs were unremarkable. The patient was instructed to take her over-the-counter iron supplement twice daily after this encounter. She is here today for evaluation and management regarding this finding.   Overall she is feeling well today. She denies any fatigue, chest pain, palpitations, or lightheadedness. She reports her baseline dyspnea on exertion due to her COPD. She denies any abnormal bleeding or bruising including epistaxis, gingival bleeding, hemoptysis, hematemesis, melena, hematochezia, or hematuria. She had a UA performed by her PCP which was negative for any blood. She is up-to-date on her colonoscopy, her last one being in 2017 under the care of Dr. Henrene Pastor. At that time there was a single hyperplastic polyp found. The patient had a hysterectomy and 1984 and denies any abnormal  vaginal bleeding. She does not take any blood thinners. She reports some GERD with eating spicy foods but denies nausea, vomiting, diarrhea, or abdominal pain at this time. She had some hypogastric/pelvic pain when she had her UTI a few weeks ago but that has resolved at this time. She denies any fevers, chills, or weight loss. She reports "hot flashes" but denies any night sweats. She denies any bariatric surgery or particular dietary habits such as being a vegan or a vegetarian.   She denies any family history of any hematologic abnormalities such as sickle cell anemia or thalassemia.  The patient's mother had breast cancer.  The patient does not know her father's past medical history.  The patient's brother has hypertension and osteoarthritis.  The patient is widowed and has 2 deceased children and 1 living child.  She used to work as a Building control surveyor.  She denies any alcohol abuse.  She has a history of cocaine use.  She currently smokes about 5 cigarettes/day.  She states that she has been smoking cigarettes for approximately 35 to 40 years.  HPI  Past Medical History:  Diagnosis Date  . Cancer ([place])   . Chicken pox   . Chronic lower back pain   . Diabetes ([place])   . GERD (gastroesophageal reflux disease)   . Hepatitis 1970's  . History of recurrent UTIs   . Hyperlipemia   . Slow heart rate dx'd 09/24/2014  . Urine incontinence     Past Surgical History:  Procedure Laterality Date  . ABDOMINAL HYSTERECTOMY  1984  . APPENDECTOMY    . BREAST BIOPSY Right 1996  . BREAST LUMPECTOMY Right  1996  . LEFT HEART CATH AND CORONARY ANGIOGRAPHY N/A 01/13/2019   Procedure: LEFT HEART CATH AND CORONARY ANGIOGRAPHY;  Surgeon: Nelva Bush, MD;  Location: [facility];  Service: Cardiovascular;  Laterality: N/A;  . MASTECTOMY Right 1996  . TONSILLECTOMY      Family History  Problem Relation Age of Onset  . Cancer Mother   . Early death Mother   . Arthritis Brother   . Colon cancer Neg Hx      Social History Social History   Tobacco Use  . Smoking status: Former Smoker    Packs/day: 0.75    Years: 47.00    Pack years: 35.25    Types: Cigarettes  . Smokeless tobacco: Never Used  . Tobacco comment: quit date: 01/19/2019  Substance Use Topics  . Alcohol use: No    Alcohol/week: 0.0 standard drinks  . Drug use: No    Types: "Crack" cocaine    Comment: 09/24/2014 "last crack was in 2013"    Allergies  Allergen Reactions  . Lisinopril Anaphylaxis  . Codeine     REACTION: MAKES HER SLEEPY    Current Outpatient Medications  Medication Sig Dispense Refill  . acetaminophen (TYLENOL) 650 MG CR tablet Take 1,300 mg by mouth every 8 (eight) hours as needed for pain. Reported on 09/23/2015    . albuterol (PROVENTIL HFA;VENTOLIN HFA) 108 (90 Base) MCG/ACT inhaler Inhale 2 puffs into the lungs every 6 (six) hours as needed for wheezing or shortness of breath. 1 Inhaler 3  . aspirin EC 81 MG tablet Take 1 tablet (81 mg total) by mouth daily. 90 tablet 3  . hydrALAZINE (APRESOLINE) 25 MG tablet TAKE 1 TABLET BY MOUTH 3  TIMES DAILY 270 tablet 2  . isosorbide mononitrate (IMDUR) 30 MG 24 hr tablet Take 1 tablet (30 mg total) by mouth daily. 30 tablet 11  . linagliptin (TRADJENTA) 5 MG TABS tablet Take 1 tablet (5 mg total) by mouth daily. 90 tablet 3  . Multiple Vitamin (MULTIVITAMIN WITH MINERALS) TABS tablet Take 1 tablet by mouth daily.    Marland Kitchen triamterene-hydrochlorothiazide (MAXZIDE-25) 37.5-25 MG tablet Take 1 tablet by mouth daily. 90 tablet 1  . Vitamin D, Ergocalciferol, (DRISDOL) 1.25 MG (50000 UNIT) CAPS capsule One capsule by mouth once a week for 12 weeks. Then take 2000IU/day 12 capsule 0  . FeFum-FePoly-FA-B Cmp-C-Biot (INTEGRA PLUS) CAPS Take 1 capsule by mouth every morning. 30 capsule 2  . ferrous sulfate 325 (65 FE) MG tablet Take 325 mg by mouth 2 (two) times daily.    . nitroGLYCERIN (NITROSTAT) 0.4 MG SL tablet Place 1 tablet (0.4 mg total) under the tongue every 5  (five) minutes as needed for chest pain. (Patient not taking: Reported on 07/12/2019) 25 tablet prn  . rosuvastatin (CRESTOR) 20 MG tablet Take 1 tablet (20 mg total) by mouth daily. 90 tablet 3   No current facility-administered medications for this visit.    Review of Systems REVIEW OF SYSTEMS:   Review of Systems  Constitutional: Negative for appetite change, chills, fatigue, fever and unexpected weight change.  HENT: Negative for mouth sores, nosebleeds, sore throat and trouble swallowing.   Eyes: Negative for eye problems and icterus.  Respiratory: Positive for baseline dyspnea on exertion. Negative for cough, hemoptysis, and wheezing.   Cardiovascular: Negative for chest pain and leg swelling.  Gastrointestinal: Positive for constipation from iron supplements. Negative for abdominal pain, constipation, diarrhea, nausea and vomiting.  Genitourinary: Negative for bladder incontinence, difficulty urinating,  dysuria, frequency and hematuria.   Musculoskeletal: Negative for back pain, gait problem, neck pain and neck stiffness.  Skin: Negative for itching and rash.  Neurological: Negative for dizziness, extremity weakness, gait problem, headaches, light-headedness and seizures.  Hematological: Negative for adenopathy. Does not bruise/bleed easily.  Psychiatric/Behavioral: Negative for confusion, depression and sleep disturbance. The patient is not nervous/anxious.     PHYSICAL EXAMINATION:  Blood pressure (!) 149/80, pulse 71, temperature 98.7 F (37.1 C), temperature source Temporal, resp. rate 17, height 5\' 6"  (1.676 m), weight 296 lb 9.6 oz (134.5 kg), SpO2 100 %.  ECOG PERFORMANCE STATUS: 1  Physical Exam  Constitutional: Oriented to person, place, and time and well-developed, well-nourished, and in no distress.  HENT:  Head: Normocephalic and atraumatic.  Mouth/Throat: Oropharynx is clear and moist. No oropharyngeal exudate.  Eyes: Conjunctivae are normal. Right eye exhibits no  discharge. Left eye exhibits no discharge. No scleral icterus.  Neck: Normal range of motion. Neck supple.  Cardiovascular: Normal rate, regular rhythm, normal heart sounds and intact distal pulses.   Pulmonary/Chest: Effort normal and breath sounds normal. No respiratory distress. No wheezes. No rales.  Abdominal: Soft. Bowel sounds are normal. Exhibits no distension and no mass. There is no tenderness.  Musculoskeletal: Normal range of motion. Exhibits no edema.  Lymphadenopathy:    No cervical adenopathy.  Neurological: Alert and oriented to person, place, and time. Exhibits normal muscle tone. Gait normal. Coordination normal.  Skin: Skin is warm and dry. No rash noted. Not diaphoretic. No erythema. No pallor.  Psychiatric: Mood, memory and judgment normal.  Vitals reviewed.  LABORATORY DATA: Lab Results  Component Value Date   WBC 9.5 07/31/2019   HGB 9.7 ([name]) 07/31/2019   HCT 33.2 ([name]) 07/31/2019   MCV 67.6 ([name]) 07/31/2019   PLT 293 07/31/2019      Chemistry      Component Value Date/Time   NA 141 07/31/2019 1258   NA 140 01/30/2019 1330   K 4.0 07/31/2019 1258   CL 104 07/31/2019 1258   CO2 30 07/31/2019 1258   BUN 14 07/31/2019 1258   BUN 18 01/30/2019 1330   CREATININE 1.32 (H) 07/31/2019 1258   CREATININE 1.28 (H) 09/21/2016 1115      Component Value Date/Time   CALCIUM 9.1 07/31/2019 1258   ALKPHOS 93 07/31/2019 1258   AST 14 ([name]) 07/31/2019 1258   ALT 8 07/31/2019 1258   BILITOT 0.4 07/31/2019 1258       RADIOGRAPHIC STUDIES: No results found.  ASSESSMENT: This is a very pleasant 69 year old African American female referred to the clinic for evaluation of microcytic anemia.   PLAN: The patient was seen with Dr. Julien Nordmann today. The patient had several lab studies today to further evaluate her condition. She had a CBC, CMP, LDH, heavy metals panel, ferritin, iron studies, and SPEP with immunofixation.   Her CBC from today demonstrates persistent microcytic  anemia with a hemoglobin of 9.7 and an MCV of 67.6. Her iron is low with a total iron of 24, a saturation low at 7%. Ferritin, heavy metals panel, and SPEP with immunofixation are still pending.   Dr. Julien Nordmann recommends arranging for intravenous iron with venofer for 4 weekly doses, starting around 4/16.   I will send Integra plus to her pharmacy due to her intolerance to OTC iron.   We will recheck her CBC, iron studies, and ferritin in 6 weeks as well as an office evaluation.   I will arrange for the patient  to have stool cards performed to assess for occult blood.   The patient voices understanding of current disease status and treatment options and is in agreement with the current care plan.  All questions were answered. The patient knows to call the clinic with any problems, questions or concerns. We can certainly see the patient much sooner if necessary.  Thank you so much for allowing me to participate in the care of [name] Cooper. I will continue to follow up the patient with you and assist in her care.  The total time spent in the appointment was 60 minutes.  Disclaimer: This note was dictated with voice recognition software. Similar sounding words can inadvertently be transcribed and may not be corrected upon review.   [name] Cooper July 31, 2019, 2:38 PM  ADDENDUM: Hematology/Oncology Attending: I had a face-to-face encounter with the patient today. I recommended her care plan. This is a very pleasant 69 years old African-American female presented for evaluation of iron deficiency anemia. The patient has normal hemoglobin and hematocrit until 6 months ago. She had previous colonoscopy 3 years ago that was unremarkable. She has been on oral iron tablets with no significant improvement in her condition but she also has some gastrointestinal issues with her over-the-counter iron tablets. We order several studies today that confirm severe iron deficiency. We will  arrange for the patient to receive Venofer infusion for the next 4 weeks. We will also check her stool cards to rule out any gastrointestinal hemorrhage. The patient will come back for follow-up visit in 6 weeks for evaluation with repeat CBC, iron study and ferritin. We also started her on oral iron tablet with Integra +1 capsule p.o. daily which I think will be better tolerated than the over-the-counter preparation. The patient was advised to call immediately if she has any other concerning symptoms in the interval.  Disclaimer: This note was dictated with voice recognition software. Similar sounding words can inadvertently be transcribed and may be missed upon review. [name] Kempf, MD 07/31/19

## 2019-08-01 ENCOUNTER — Telehealth: Payer: Self-pay | Admitting: Oncology

## 2019-08-01 LAB — PROTEIN ELECTROPHORESIS, SERUM, WITH REFLEX
A/G Ratio: 0.8 (ref 0.7–1.7)
Albumin ELP: 3.2 g/dL (ref 2.9–4.4)
Alpha-1-Globulin: 0.2 g/dL (ref 0.0–0.4)
Alpha-2-Globulin: 1 g/dL (ref 0.4–1.0)
Beta Globulin: 1.1 g/dL (ref 0.7–1.3)
Gamma Globulin: 1.4 g/dL (ref 0.4–1.8)
Globulin, Total: 3.8 g/dL (ref 2.2–3.9)
Total Protein ELP: 7 g/dL (ref 6.0–8.5)

## 2019-08-01 LAB — HEAVY METALS, BLOOD
Arsenic: 6 ug/L (ref 2–23)
Lead: 2 ug/dL (ref 0–4)
Mercury: <1 ug/L (ref 0.0–14.9)

## 2019-08-01 NOTE — Telephone Encounter (Signed)
Scheduled per los. Called and left msg  ° °

## 2019-08-04 ENCOUNTER — Other Ambulatory Visit: Payer: Self-pay

## 2019-08-04 ENCOUNTER — Inpatient Hospital Stay: Payer: Medicare Other

## 2019-08-04 VITALS — BP 135/60 | HR 57 | Temp 98.0°F | Resp 18

## 2019-08-04 DIAGNOSIS — D509 Iron deficiency anemia, unspecified: Secondary | ICD-10-CM

## 2019-08-04 MED ORDER — SODIUM CHLORIDE 0.9 % IV SOLN
Freq: Once | INTRAVENOUS | Status: AC
  Filled 2019-08-04: qty 250

## 2019-08-04 MED ORDER — ACETAMINOPHEN 325 MG PO TABS
650.0000 mg | ORAL_TABLET | Freq: Once | ORAL | Status: AC
  Administered 2019-08-04: 650 mg via ORAL

## 2019-08-04 MED ORDER — SODIUM CHLORIDE 0.9 % IV SOLN
200.0000 mg | Freq: Once | INTRAVENOUS | Status: AC
  Administered 2019-08-04: 200 mg via INTRAVENOUS
  Filled 2019-08-04: qty 200

## 2019-08-04 MED ORDER — DIPHENHYDRAMINE HCL 25 MG PO CAPS
50.0000 mg | ORAL_CAPSULE | Freq: Once | ORAL | Status: AC
  Administered 2019-08-04: 50 mg via ORAL

## 2019-08-04 MED ORDER — ACETAMINOPHEN 325 MG PO TABS
ORAL_TABLET | ORAL | Status: AC
  Filled 2019-08-04: qty 2

## 2019-08-04 MED ORDER — DIPHENHYDRAMINE HCL 25 MG PO CAPS
ORAL_CAPSULE | ORAL | Status: AC
  Filled 2019-08-04: qty 2

## 2019-08-04 NOTE — Patient Instructions (Signed)

## 2019-08-11 ENCOUNTER — Other Ambulatory Visit: Payer: Self-pay

## 2019-08-11 ENCOUNTER — Inpatient Hospital Stay: Payer: Medicare Other

## 2019-08-11 VITALS — BP 125/74 | HR 58 | Temp 98.0°F | Resp 18

## 2019-08-11 DIAGNOSIS — D509 Iron deficiency anemia, unspecified: Secondary | ICD-10-CM

## 2019-08-11 LAB — OCCULT BLOOD X 1 CARD TO LAB, STOOL
Fecal Occult Bld: NEGATIVE
Fecal Occult Bld: NEGATIVE
Fecal Occult Bld: POSITIVE — AB

## 2019-08-11 MED ORDER — SODIUM CHLORIDE 0.9 % IV SOLN
Freq: Once | INTRAVENOUS | Status: AC
  Filled 2019-08-11: qty 250

## 2019-08-11 MED ORDER — DIPHENHYDRAMINE HCL 25 MG PO CAPS
50.0000 mg | ORAL_CAPSULE | Freq: Once | ORAL | Status: AC
  Administered 2019-08-11: 50 mg via ORAL

## 2019-08-11 MED ORDER — ACETAMINOPHEN 325 MG PO TABS
ORAL_TABLET | ORAL | Status: AC
  Filled 2019-08-11: qty 2

## 2019-08-11 MED ORDER — ACETAMINOPHEN 325 MG PO TABS
650.0000 mg | ORAL_TABLET | Freq: Once | ORAL | Status: AC
  Administered 2019-08-11: 650 mg via ORAL

## 2019-08-11 MED ORDER — DIPHENHYDRAMINE HCL 25 MG PO CAPS
ORAL_CAPSULE | ORAL | Status: AC
  Filled 2019-08-11: qty 2

## 2019-08-11 MED ORDER — SODIUM CHLORIDE 0.9 % IV SOLN
200.0000 mg | Freq: Once | INTRAVENOUS | Status: AC
  Administered 2019-08-11: 200 mg via INTRAVENOUS
  Filled 2019-08-11: qty 200

## 2019-08-11 NOTE — Patient Instructions (Signed)

## 2019-08-14 ENCOUNTER — Telehealth: Payer: Self-pay | Admitting: Physician Assistant

## 2019-08-14 NOTE — Telephone Encounter (Signed)
Called the patient to let her know one of her stool cards was positive for blood. Advised her to call her gastroenterologist to further evaluate her GI blood loss/Anemia. She is established with [facility]. She is receiving IV iron through our clinic. She expressed understanding.

## 2019-08-25 ENCOUNTER — Other Ambulatory Visit: Payer: Self-pay

## 2019-08-25 ENCOUNTER — Inpatient Hospital Stay: Payer: Medicare Other | Attending: Physician Assistant

## 2019-08-25 VITALS — BP 115/63 | HR 62 | Temp 97.9°F | Resp 17

## 2019-08-25 DIAGNOSIS — J449 Chronic obstructive pulmonary disease, unspecified: Secondary | ICD-10-CM | POA: Diagnosis not present

## 2019-08-25 DIAGNOSIS — D509 Iron deficiency anemia, unspecified: Secondary | ICD-10-CM

## 2019-08-25 MED ORDER — SODIUM CHLORIDE 0.9 % IV SOLN
Freq: Once | INTRAVENOUS | Status: AC
  Filled 2019-08-25: qty 250

## 2019-08-25 MED ORDER — DIPHENHYDRAMINE HCL 25 MG PO CAPS
ORAL_CAPSULE | ORAL | Status: AC
  Filled 2019-08-25: qty 2

## 2019-08-25 MED ORDER — ACETAMINOPHEN 325 MG PO TABS
650.0000 mg | ORAL_TABLET | Freq: Once | ORAL | Status: AC
  Administered 2019-08-25: 650 mg via ORAL

## 2019-08-25 MED ORDER — DIPHENHYDRAMINE HCL 25 MG PO CAPS
50.0000 mg | ORAL_CAPSULE | Freq: Once | ORAL | Status: AC
  Administered 2019-08-25: 50 mg via ORAL

## 2019-08-25 MED ORDER — ACETAMINOPHEN 325 MG PO TABS
ORAL_TABLET | ORAL | Status: AC
  Filled 2019-08-25: qty 2

## 2019-08-25 MED ORDER — SODIUM CHLORIDE 0.9 % IV SOLN
200.0000 mg | Freq: Once | INTRAVENOUS | Status: AC
  Administered 2019-08-25: 200 mg via INTRAVENOUS
  Filled 2019-08-25: qty 200

## 2019-08-25 NOTE — Patient Instructions (Signed)

## 2019-09-01 ENCOUNTER — Inpatient Hospital Stay: Payer: Medicare Other

## 2019-09-01 ENCOUNTER — Other Ambulatory Visit: Payer: Self-pay

## 2019-09-01 VITALS — BP 124/62 | HR 56 | Temp 97.9°F | Resp 16

## 2019-09-01 DIAGNOSIS — D509 Iron deficiency anemia, unspecified: Secondary | ICD-10-CM | POA: Diagnosis not present

## 2019-09-01 DIAGNOSIS — J449 Chronic obstructive pulmonary disease, unspecified: Secondary | ICD-10-CM | POA: Diagnosis not present

## 2019-09-01 MED ORDER — DIPHENHYDRAMINE HCL 25 MG PO CAPS
ORAL_CAPSULE | ORAL | Status: AC
  Filled 2019-09-01: qty 2

## 2019-09-01 MED ORDER — SODIUM CHLORIDE 0.9 % IV SOLN
200.0000 mg | Freq: Once | INTRAVENOUS | Status: AC
  Administered 2019-09-01: 200 mg via INTRAVENOUS
  Filled 2019-09-01: qty 10

## 2019-09-01 MED ORDER — DIPHENHYDRAMINE HCL 25 MG PO CAPS
50.0000 mg | ORAL_CAPSULE | Freq: Once | ORAL | Status: AC
  Administered 2019-09-01: 50 mg via ORAL

## 2019-09-01 MED ORDER — SODIUM CHLORIDE 0.9 % IV SOLN
Freq: Once | INTRAVENOUS | Status: AC
  Filled 2019-09-01: qty 250

## 2019-09-01 MED ORDER — ACETAMINOPHEN 325 MG PO TABS
ORAL_TABLET | ORAL | Status: AC
  Filled 2019-09-01: qty 2

## 2019-09-01 MED ORDER — ACETAMINOPHEN 325 MG PO TABS
650.0000 mg | ORAL_TABLET | Freq: Once | ORAL | Status: AC
  Administered 2019-09-01: 650 mg via ORAL

## 2019-09-01 NOTE — Patient Instructions (Signed)
Iron Sucrose injection What is this medicine? IRON SUCROSE (AHY ern SOO krohs) is an iron complex. Iron is used to make healthy red blood cells, which carry oxygen and nutrients throughout the body. This medicine is used to treat iron deficiency anemia in people with chronic kidney disease. This medicine may be used for other purposes; ask your health care provider or pharmacist if you have questions. COMMON BRAND NAME(S): Venofer What should I tell my health care provider before I take this medicine? They need to know if you have any of these conditions:  anemia not caused by low iron levels  heart disease  high levels of iron in the blood  kidney disease  liver disease  an unusual or allergic reaction to iron, other medicines, foods, dyes, or preservatives  pregnant or trying to get pregnant  breast-feeding How should I use this medicine? This medicine is for infusion into a vein. It is given by a health care professional in a hospital or clinic setting. Talk to your pediatrician regarding the use of this medicine in children. While this drug may be prescribed for children as young as 2 years for selected conditions, precautions do apply. Overdosage: If you think you have taken too much of this medicine contact a poison control center or emergency room at once. NOTE: This medicine is only for you. Do not share this medicine with others. What if I miss a dose? It is important not to miss your dose. Call your doctor or health care professional if you are unable to keep an appointment. What may interact with this medicine? Do not take this medicine with any of the following medications:  deferoxamine  dimercaprol  other iron products This medicine may also interact with the following medications:  chloramphenicol  deferasirox This list may not describe all possible interactions. Give your health care provider a list of all the medicines, herbs, non-prescription drugs, or  dietary supplements you use. Also tell them if you smoke, drink alcohol, or use illegal drugs. Some items may interact with your medicine. What should I watch for while using this medicine? Visit your doctor or healthcare professional regularly. Tell your doctor or healthcare professional if your symptoms do not start to get better or if they get worse. You may need blood work done while you are taking this medicine. You may need to follow a special diet. Talk to your doctor. Foods that contain iron include: whole grains/cereals, dried fruits, beans, or peas, leafy green vegetables, and organ meats (liver, kidney). What side effects may I notice from receiving this medicine? Side effects that you should report to your doctor or health care professional as soon as possible:  allergic reactions like skin rash, itching or hives, swelling of the face, lips, or tongue  breathing problems  changes in blood pressure  cough  fast, irregular heartbeat  feeling faint or lightheaded, falls  fever or chills  flushing, sweating, or hot feelings  joint or muscle aches/pains  seizures  swelling of the ankles or feet  unusually weak or tired Side effects that usually do not require medical attention (report to your doctor or health care professional if they continue or are bothersome):  diarrhea  feeling achy  headache  irritation at site where injected  nausea, vomiting  stomach upset  tiredness This list may not describe all possible side effects. Call your doctor for medical advice about side effects. You may report side effects to FDA at 1-800-FDA-1088. Where should I keep   my medicine? This drug is given in a hospital or clinic and will not be stored at home. NOTE: This sheet is a summary. It may not cover all possible information. If you have questions about this medicine, talk to your doctor, pharmacist, or health care provider.  2020 Elsevier/Gold Standard (2011-01-15  17:14:35) Coronavirus (COVID-19) Are you at risk?  Are you at risk for the Coronavirus (COVID-19)?  To be considered HIGH RISK for Coronavirus (COVID-19), you have to meet the following criteria:  . Traveled to [place], [place], [place], [place] or [place]; or in the [place] to [place], [place], [place], or [place]; and have fever, cough, and shortness of breath within the last 2 weeks of travel OR . Been in close contact with a person diagnosed with COVID-19 within the last 2 weeks and have fever, cough, and shortness of breath . IF YOU DO NOT MEET THESE CRITERIA, YOU ARE CONSIDERED LOW RISK FOR COVID-19.  What to do if you are HIGH RISK for COVID-19?  . If you are having a medical emergency, call 911. . Seek medical care right away. Before you go to a doctor's office, urgent care or emergency department, call ahead and tell them about your recent travel, contact with someone diagnosed with COVID-19, and your symptoms. You should receive instructions from your physician's office regarding next steps of care.  . When you arrive at healthcare provider, tell the healthcare staff immediately you have returned from visiting [place], [place], [place], [place] or [place]; or traveled in the [place] to [place], [place], [place], or [place]; in the last two weeks or you have been in close contact with a person diagnosed with COVID-19 in the last 2 weeks.   . Tell the health care staff about your symptoms: fever, cough and shortness of breath. . After you have been seen by a medical provider, you will be either: o Tested for (COVID-19) and discharged home on quarantine except to seek medical care if symptoms worsen, and asked to  - Stay home and avoid contact with others until you get your results (4-5 days)  - Avoid travel on public transportation if possible (such as bus, train, or airplane) or o Sent to the Emergency Department by EMS for evaluation, COVID-19 testing, and  possible admission depending on your condition and test results.  What to do if you are LOW RISK for COVID-19?  Reduce your risk of any infection by using the same precautions used for avoiding the common cold or flu:  . Wash your hands often with soap and warm water for at least 20 seconds.  If soap and water are not readily available, use an alcohol-based hand sanitizer with at least 60% alcohol.  . If coughing or sneezing, cover your mouth and nose by coughing or sneezing into the elbow areas of your shirt or coat, into a tissue or into your sleeve (not your hands). . Avoid shaking hands with others and consider head nods or verbal greetings only. . Avoid touching your eyes, nose, or mouth with unwashed hands.  . Avoid close contact with people who are sick. . Avoid places or events with large numbers of people in one location, like concerts or sporting events. . Carefully consider travel plans you have or are making. . If you are planning any travel outside or inside the [place], visit the CDC's Travelers' Health webpage for the latest health notices. . If you have some symptoms but not all   symptoms, continue to monitor at home and seek medical attention if your symptoms worsen. . If you are having a medical emergency, call 911.   ADDITIONAL HEALTHCARE OPTIONS FOR PATIENTS  [facility] / e-Visit: [email or web address]         [facility]: [phone]  [facility]: [phone]                   [facility]: [phone]  

## 2019-09-01 NOTE — Progress Notes (Signed)
Pt. decided not to stay for full 30 minutes post observation. Denies complaints of chest pain, dizziness, and no shortness of breath noted. Pt. left via ambulation.

## 2019-09-11 NOTE — Progress Notes (Signed)
Cardiology Office Note   Date:  09/21/2019   ID:  [name] Cooper, DOB 12-Apr-1951, MRN [id number]  PCP:  [name] Flaming, MD  Cardiologist: Dr. Marlou Porch, MD   Chief Complaint  Patient presents with  . Follow-up    History of Present Illness: [name] Cooper is a 69 y.o. female who presents for 74-month follow-up, seen for Dr.  Marlou Porch.  [name] Cooper has a history of tobacco use with COPD followed by pulmonary, HLD, coronary calcifications per chest CT in the LAD and mid to distal RCA regions, palpitations.   She underwent an [facility] 01/13/2019 which showed 80% LAD lesion that is being treated medically given no angina  Most recently seen by Dr. Marlou Porch on 03/03/2019 in follow-up for CAD at which time she had no specific complaints and seemed to be doing well from a CV standpoint.  Today she is here alone for follow up and reports that she is doing well with no anginal symptoms, denies SOB, PND, LE edema, palpitations, orthopnea, dizziness or syncope. She states that she has follow up with her PCP for wellness check next week therefore will let her get labs from there. Reports she continues to not smoke. Stays busy with her nine great grandchildren. On my review, it appears that she has EKG changes with multiple PACs/PVCs when compared to prior tracing. Discussed that I will review with Dr. Marlou Porch and let her know of follow up plan. May need further workup up given known 80% lesion in the mLAD region. She had mild non-obstructive CAD in the RCA and LCx vessels. No intervention was performed given the absence of symptoms. Could possible do PCI if symptoms occur per cath note.   Case dicussed with Dr. Marlou Porch, DOD and patients primary cardiologist who recommends echocardiogram and starting low dose metoprolol 12.5mg  PO BID.   Past Medical History:  Diagnosis Date  . Cancer ([place])   . Chicken pox   . Chronic lower back pain   . Diabetes ([place])   . GERD (gastroesophageal reflux disease)   .  Hepatitis 1970's  . History of recurrent UTIs   . Hyperlipemia   . Slow heart rate dx'd 09/24/2014  . Urine incontinence     Past Surgical History:  Procedure Laterality Date  . ABDOMINAL HYSTERECTOMY  1984  . APPENDECTOMY    . BREAST BIOPSY Right 1996  . BREAST LUMPECTOMY Right 1996  . LEFT HEART CATH AND CORONARY ANGIOGRAPHY N/A 01/13/2019   Procedure: LEFT HEART CATH AND CORONARY ANGIOGRAPHY;  Surgeon: Nelva Bush, MD;  Location: [facility];  Service: Cardiovascular;  Laterality: N/A;  . MASTECTOMY Right 1996  . TONSILLECTOMY       Current Outpatient Medications  Medication Sig Dispense Refill  . acetaminophen (TYLENOL) 650 MG CR tablet Take 1,300 mg by mouth every 8 (eight) hours as needed for pain. Reported on 09/23/2015    . albuterol (PROVENTIL HFA;VENTOLIN HFA) 108 (90 Base) MCG/ACT inhaler Inhale 2 puffs into the lungs every 6 (six) hours as needed for wheezing or shortness of breath. 1 Inhaler 3  . aspirin EC 81 MG tablet Take 1 tablet (81 mg total) by mouth daily. 90 tablet 3  . FeFum-FePoly-FA-B Cmp-C-Biot (INTEGRA PLUS) CAPS Take 1 capsule by mouth every morning. 30 capsule 2  . hydrALAZINE (APRESOLINE) 25 MG tablet TAKE 1 TABLET BY MOUTH 3  TIMES DAILY 270 tablet 2  . isosorbide mononitrate (IMDUR) 30 MG 24 hr tablet Take 1 tablet (30 mg total) by  mouth daily. 30 tablet 11  . linagliptin (TRADJENTA) 5 MG TABS tablet Take 1 tablet (5 mg total) by mouth daily. 90 tablet 3  . Multiple Vitamin (MULTIVITAMIN WITH MINERALS) TABS tablet Take 1 tablet by mouth daily.    . nitroGLYCERIN (NITROSTAT) 0.4 MG SL tablet Place 1 tablet (0.4 mg total) under the tongue every 5 (five) minutes as needed for chest pain. 25 tablet prn  . triamterene-hydrochlorothiazide (MAXZIDE-25) 37.5-25 MG tablet Take 1 tablet by mouth daily. 90 tablet 1  . Vitamin D, Ergocalciferol, (DRISDOL) 1.25 MG (50000 UNIT) CAPS capsule One capsule by mouth once a week for 12 weeks. Then take 2000IU/day 12  capsule 0  . rosuvastatin (CRESTOR) 20 MG tablet Take 1 tablet (20 mg total) by mouth daily. 90 tablet 3   No current facility-administered medications for this visit.    Allergies:   Lisinopril and Codeine    Social History:  The patient  reports that she has quit smoking. Her smoking use included cigarettes. She has a 35.25 pack-year smoking history. She has never used smokeless tobacco. She reports that she does not drink alcohol or use drugs.   Family History:  The patient's family history includes Arthritis in her brother; Cancer in her mother; Early death in her mother.    ROS:  Please see the history of present illness.   Otherwise, review of systems are positive for none. All other systems are reviewed and negative.    PHYSICAL EXAM: VS:  BP 120/80   Pulse 81   Ht 5\' 6"  (1.676 m)   Wt 291 lb 6.4 oz (132.2 kg)   LMP  (LMP Unknown)   SpO2 94%   BMI 47.03 kg/m  , BMI Body mass index is 47.03 kg/m.    General: Obese, well nourished, NAD Neck: Negative for carotid bruits. No JVD Lungs:Clear to ausculation bilaterally. No wheezes, rales, or rhonchi. Breathing is unlabored. Cardiovascular: RRR with S1 S2. No murmurs Extremities: No edema. Radial pulses 2+ bilaterally Neuro: Alert and oriented. No focal deficits. No facial asymmetry. MAE spontaneously. Psych: Responds to questions appropriately with normal affect.     EKG:  EKG is ordered today. The ekg ordered today demonstrates NSR with PACs/PVC? RBBB   Recent Labs: 07/31/2019: ALT 8; BUN 14; Creatinine 1.32; Potassium 4.0; Sodium 141 09/14/2019: Hemoglobin 11.5; Platelet Count 296    Lipid Panel    Component Value Date/Time   CHOL 153 05/15/2019 1200   TRIG 95 05/15/2019 1200   HDL 67 05/15/2019 1200   CHOLHDL 2.3 05/15/2019 1200   CHOLHDL 5 06/07/2018 1446   VLDL 62.2 (H) 06/07/2018 1446   LDLCALC 69 05/15/2019 1200   LDLDIRECT 159.0 06/07/2018 1446    Wt Readings from Last 3 Encounters:  09/21/19 291 lb  6.4 oz (132.2 kg)  09/19/19 291 lb 12.8 oz (132.4 kg)  09/14/19 290 lb 12.8 oz (131.9 kg)      Other studies Reviewed: Additional studies/ records that were reviewed today include:  Review of the above records demonstrates:  [facility] 01/13/2019:  Conclusions: 1. Significant single vessel coronary artery disease with moderately to severely calcified 80% mid LAD stenosis. 2. Mild, non-obstructive coronary artery disease involving the LCx and RCA. 3. Normal left ventricular systolic function and filling pressure.  Recommendations: 1. Given lack of chest pain (though dyspnea on exertion may be anginal equivalent) and no ongoing antianginal therapy, I recommend trial of isosorbide mononitrate.  If Ms. Bisogno has persistent symptoms despite optimal medical therapy at  follow-up, PCI to mid LAD with orbital atherectomy will need to be considered. 2. Aggressive secondary prevention.  ASSESSMENT AND PLAN:  1. Known CAD without angina: -LHC 12/2018 with mLAD lesion at 80% with severe calcification  -In the absence of symptoms>>no intervention performed -Denies chest pain today -EKG with multiple PAC/PVCs>>will obtain echocardiogram and start low dose metoprolol 12.5mg  PO BID -Continue ASA, IMDUR, statin, beta blocker   2. Essential hypertension: -Continue current regimen   3. Hx of tobacco use: -Reports cessation 01/2019>>congratulated    Current medicines are reviewed at length with the patient today.  The patient does not have concerns regarding medicines.  The following changes have been made:  Will add low dose metoprolol 12.5mg  PO BID  Labs/ tests ordered today include: echocardiogram  No orders of the defined types were placed in this encounter.  Disposition:   FU with Dr. Marlou Porch  in 3 months  Signed, Kathyrn Drown, NP  09/21/2019 4:20 PM    [address], [place], [place]  [id number] Phone: [phone]; Fax: [phone]

## 2019-09-12 NOTE — Progress Notes (Signed)
[facility] OFFICE PROGRESS NOTE  [name] [id number]  DIAGNOSIS: Iron deficiency anemia  PRIOR THERAPY: None  CURRENT THERAPY: 1) IV iron PRN with venofer. Last given on 09/01/19 2) Oral iron supplement with integra plus p.o. daily.   INTERVAL HISTORY: [name] Cooper 69 y.o. female returns to the clinic today for a follow-up visit.  The patient was initially referred to the clinic for evaluation of microcytic anemia.  The patient had stool cards performed to assess for GI blood loss.  The patient's stool cards came back positive for blood.  The patient has a follow-up appointment with her gastroenterologist, Dr. Henrene Pastor, on September 19, 2019.  The patient received weekly iron infusions with Venofer x4.  She tolerated this well without any concerning complaints except has some mild swelling over the IV site of her infusion which was on 5/7. She denies tenderness, erythema, or overlying skin changes. The area is soft. She did not notice an appreciable difference after receiving IV iron. She is also been compliant with her Integra plus oral iron supplements.  She states that she takes this as prescribed daily. She denies any fatigue, chest pain, palpitations, or lightheadedness. She reports her baseline dyspnea on exertion due to her COPD which has been worse lately due to the warm weather per patient report. She denies any abnormal bleeding or bruising including epistaxis, gingival bleeding, hemoptysis, hematemesis, hematochezia, or hematuria. She states her stool is now dark but attributes that to her iron supplement. She is here today for evaluation and a repeat CBC, iron studies, and ferritin.   MEDICAL HISTORY: Past Medical History:  Diagnosis Date  . Cancer ([place])   . Chicken pox   . Chronic lower back pain   . Diabetes ([place])   . GERD (gastroesophageal reflux disease)   . Hepatitis 1970's  . History of recurrent UTIs   . Hyperlipemia   .  Slow heart rate dx'd 09/24/2014  . Urine incontinence     ALLERGIES:  is allergic to lisinopril and codeine.  MEDICATIONS:  Current Outpatient Medications  Medication Sig Dispense Refill  . acetaminophen (TYLENOL) 650 MG CR tablet Take 1,300 mg by mouth every 8 (eight) hours as needed for pain. Reported on 09/23/2015    . albuterol (PROVENTIL HFA;VENTOLIN HFA) 108 (90 Base) MCG/ACT inhaler Inhale 2 puffs into the lungs every 6 (six) hours as needed for wheezing or shortness of breath. 1 Inhaler 3  . aspirin EC 81 MG tablet Take 1 tablet (81 mg total) by mouth daily. 90 tablet 3  . FeFum-FePoly-FA-B Cmp-C-Biot (INTEGRA PLUS) CAPS Take 1 capsule by mouth every morning. 30 capsule 2  . ferrous sulfate 325 (65 FE) MG tablet Take 325 mg by mouth 2 (two) times daily.    . hydrALAZINE (APRESOLINE) 25 MG tablet TAKE 1 TABLET BY MOUTH 3  TIMES DAILY 270 tablet 2  . isosorbide mononitrate (IMDUR) 30 MG 24 hr tablet Take 1 tablet (30 mg total) by mouth daily. 30 tablet 11  . linagliptin (TRADJENTA) 5 MG TABS tablet Take 1 tablet (5 mg total) by mouth daily. 90 tablet 3  . Multiple Vitamin (MULTIVITAMIN WITH MINERALS) TABS tablet Take 1 tablet by mouth daily.    . nitroGLYCERIN (NITROSTAT) 0.4 MG SL tablet Place 1 tablet (0.4 mg total) under the tongue every 5 (five) minutes as needed for chest pain. 25 tablet prn  . triamterene-hydrochlorothiazide (MAXZIDE-25) 37.5-25 MG tablet Take 1 tablet by mouth daily.  90 tablet 1  . Vitamin D, Ergocalciferol, (DRISDOL) 1.25 MG (50000 UNIT) CAPS capsule One capsule by mouth once a week for 12 weeks. Then take 2000IU/day 12 capsule 0  . rosuvastatin (CRESTOR) 20 MG tablet Take 1 tablet (20 mg total) by mouth daily. 90 tablet 3   No current facility-administered medications for this visit.    SURGICAL HISTORY:  Past Surgical History:  Procedure Laterality Date  . ABDOMINAL HYSTERECTOMY  1984  . APPENDECTOMY    . BREAST BIOPSY Right 1996  . BREAST LUMPECTOMY Right  1996  . LEFT HEART CATH AND CORONARY ANGIOGRAPHY N/A 01/13/2019   Procedure: LEFT HEART CATH AND CORONARY ANGIOGRAPHY;  Surgeon: Nelva Bush, MD;  Location: [facility];  Service: Cardiovascular;  Laterality: N/A;  . MASTECTOMY Right 1996  . TONSILLECTOMY      REVIEW OF SYSTEMS:   Review of Systems  Constitutional: Negative for appetite change, chills, fatigue, fever and unexpected weight change.  HENT: Negative for mouth sores, nosebleeds, sore throat and trouble swallowing.   Eyes: Negative for eye problems and icterus.  Respiratory: Positive for baseline dyspnea on exertion due to COPD. Negative for cough, hemoptysis, and wheezing.   Cardiovascular: Negative for chest pain and leg swelling.  Gastrointestinal: Positive for dark stools. Negative for abdominal pain, constipation, diarrhea, nausea and vomiting.  Genitourinary: Negative for bladder incontinence, difficulty urinating, dysuria, frequency and hematuria.   Musculoskeletal: Negative for back pain, gait problem, neck pain and neck stiffness.  Skin: Negative for itching and rash.  Neurological: Negative for dizziness, extremity weakness, gait problem, headaches, light-headedness and seizures.  Hematological: Negative for adenopathy. Does not bruise/bleed easily.  Psychiatric/Behavioral: Negative for confusion, depression and sleep disturbance. The patient is not nervous/anxious.    PHYSICAL EXAMINATION:  Blood pressure (!) 144/74, pulse 73, temperature (!) 97.5 F (36.4 C), temperature source Temporal, resp. rate 20, height 5\' 6"  (1.676 m), weight 290 lb 12.8 oz (131.9 kg), SpO2 100 %.  ECOG PERFORMANCE STATUS: 0 - Asymptomatic  Physical Exam  Constitutional: Oriented to person, place, and time and well-developed, well-nourished, and in no distress.  HENT:  Head: Normocephalic and atraumatic.  Mouth/Throat: Oropharynx is clear and moist. No oropharyngeal exudate.  Eyes: Conjunctivae are normal. Right eye exhibits  no discharge. Left eye exhibits no discharge. No scleral icterus.  Neck: Normal range of motion. Neck supple.  Cardiovascular: Normal rate, regular rhythm, normal heart sounds and intact distal pulses.   Pulmonary/Chest: Effort normal and breath sounds normal. No respiratory distress. No wheezes. No rales.  Abdominal: Soft. Bowel sounds are normal. Exhibits no distension and no mass. There is no tenderness.  Musculoskeletal: Normal range of motion. Exhibits no edema.  Lymphadenopathy:    No cervical adenopathy.  Neurological: Alert and oriented to person, place, and time. Exhibits normal muscle tone. Gait normal. Coordination normal.  Skin: Skin is warm and dry. No rash noted. Not diaphoretic. No erythema. No pallor. No tenderness to palpation over IV site. No thrombophlebitis. Likely extravasation from a few weeks ago.   Psychiatric: Mood, memory and judgment normal.  Vitals reviewed.  LABORATORY DATA: Lab Results  Component Value Date   WBC 10.9 (H) 09/14/2019   HGB 11.5 (L) 09/14/2019   HCT 38.5 09/14/2019   MCV 71.3 (L) 09/14/2019   PLT 296 09/14/2019      Chemistry      Component Value Date/Time   NA 141 07/31/2019 1258   NA 140 01/30/2019 1330   K 4.0 07/31/2019 1258  CL 104 07/31/2019 1258   CO2 30 07/31/2019 1258   BUN 14 07/31/2019 1258   BUN 18 01/30/2019 1330   CREATININE 1.32 (H) 07/31/2019 1258   CREATININE 1.28 (H) 09/21/2016 1115      Component Value Date/Time   CALCIUM 9.1 07/31/2019 1258   ALKPHOS 93 07/31/2019 1258   AST 14 (L) 07/31/2019 1258   ALT 8 07/31/2019 1258   BILITOT 0.4 07/31/2019 1258       RADIOGRAPHIC STUDIES:  No results found.   ASSESSMENT/PLAN:  This is a very pleasant 69 year old African-American female referred to the clinic for evaluation of iron deficiency anemia.  She has been taking Integra +1 capsule p.o. daily.  She also receives IV iron with Venofer weekly x4 as needed. Last dose on 09/01/19   The patient had a  repeat CBC, ferritin, and iron studies performed.  The patient's CBC notes improvement in her hbg to 11.5 today. Her iron studies are pending.   The patient was advised to keep her appointment with Dr. Henrene Pastor on 09/19/2019 to further evaluate her positive stool cards.  I will call her once I have the results of her iron studies and ferritin back. If it continues to be significantly low, we may arrange for IV iron infusions. Otherwise, I will arrange for her next follow up and repeat CBC, ferritin, and iron studies.   She will continue to take the oral iron supplement.   For the localized area of swelling on her right forearm near the IV site, there is no skin changes, tenderness, erythema, or warmth. Discussed that it may take time for this to improve but to call us if any new or worsening symptoms.   The patient was advised to call immediately if she has any concerning symptoms in the interval. The patient voices understanding of current disease status and treatment options and is in agreement with the current care plan. All questions were answered. The patient knows to call the clinic with any problems, questions or concerns. We can certainly see the patient much sooner if necessary   No orders of the defined types were placed in this encounter.    Valda Christenson L Gerasimos Plotts, PA-C 09/14/19

## 2019-09-14 ENCOUNTER — Inpatient Hospital Stay: Payer: Medicare Other

## 2019-09-14 ENCOUNTER — Inpatient Hospital Stay (HOSPITAL_BASED_OUTPATIENT_CLINIC_OR_DEPARTMENT_OTHER): Payer: Medicare Other | Admitting: Physician Assistant

## 2019-09-14 ENCOUNTER — Other Ambulatory Visit: Payer: Medicare Other

## 2019-09-14 ENCOUNTER — Ambulatory Visit: Payer: Medicare Other | Admitting: Nurse Practitioner

## 2019-09-14 ENCOUNTER — Other Ambulatory Visit: Payer: Self-pay

## 2019-09-14 VITALS — BP 144/74 | HR 73 | Temp 97.5°F | Resp 20 | Ht 66.0 in | Wt 290.8 lb

## 2019-09-14 DIAGNOSIS — D509 Iron deficiency anemia, unspecified: Secondary | ICD-10-CM

## 2019-09-14 DIAGNOSIS — R195 Other fecal abnormalities: Secondary | ICD-10-CM | POA: Diagnosis not present

## 2019-09-14 DIAGNOSIS — J449 Chronic obstructive pulmonary disease, unspecified: Secondary | ICD-10-CM | POA: Diagnosis not present

## 2019-09-14 LAB — CBC WITH DIFFERENTIAL (CANCER CENTER ONLY)
Abs Immature Granulocytes: 0.02 10*3/uL (ref 0.00–0.07)
Basophils Absolute: 0.1 10*3/uL (ref 0.0–0.1)
Basophils Relative: 1 %
Eosinophils Absolute: 0.7 10*3/uL — ABNORMAL HIGH (ref 0.0–0.5)
Eosinophils Relative: 7 %
HCT: 38.5 % (ref 36.0–46.0)
Hemoglobin: 11.5 g/dL — ABNORMAL LOW (ref 12.0–15.0)
Immature Granulocytes: 0 %
Lymphocytes Relative: 36 %
Lymphs Abs: 3.9 10*3/uL (ref 0.7–4.0)
MCH: 21.3 pg — ABNORMAL LOW (ref 26.0–34.0)
MCHC: 29.9 g/dL — ABNORMAL LOW (ref 30.0–36.0)
MCV: 71.3 fL — ABNORMAL LOW (ref 80.0–100.0)
Monocytes Absolute: 0.7 10*3/uL (ref 0.1–1.0)
Monocytes Relative: 7 %
Neutro Abs: 5.4 10*3/uL (ref 1.7–7.7)
Neutrophils Relative %: 49 %
Platelet Count: 296 10*3/uL (ref 150–400)
RBC: 5.4 MIL/uL — ABNORMAL HIGH (ref 3.87–5.11)
RDW: 24 % — ABNORMAL HIGH (ref 11.5–15.5)
WBC Count: 10.9 10*3/uL — ABNORMAL HIGH (ref 4.0–10.5)
nRBC: 0 % (ref 0.0–0.2)

## 2019-09-15 ENCOUNTER — Telehealth: Payer: Self-pay | Admitting: Physician Assistant

## 2019-09-15 LAB — IRON AND TIBC
Iron: 48 ug/dL (ref 41–142)
Saturation Ratios: 18 % — ABNORMAL LOW (ref 21–57)
TIBC: 276 ug/dL (ref 236–444)
UIBC: 228 ug/dL (ref 120–384)

## 2019-09-15 LAB — FERRITIN: Ferritin: 114 ng/mL (ref 11–307)

## 2019-09-15 NOTE — Telephone Encounter (Signed)
Called the patient to let her know that she does not need another iron infusion at this time. I will arrange for a lab and follow up appointment to recheck her cbc, ferritin, and iron studies. She expressed understanding. I will send scheduling message to arrange for her next follow up appointment.

## 2019-09-19 ENCOUNTER — Telehealth: Payer: Self-pay | Admitting: Physician Assistant

## 2019-09-19 ENCOUNTER — Ambulatory Visit (INDEPENDENT_AMBULATORY_CARE_PROVIDER_SITE_OTHER): Payer: Medicare Other | Admitting: Internal Medicine

## 2019-09-19 ENCOUNTER — Encounter: Payer: Self-pay | Admitting: Internal Medicine

## 2019-09-19 VITALS — BP 114/80 | HR 76 | Ht 66.0 in | Wt 291.8 lb

## 2019-09-19 DIAGNOSIS — D509 Iron deficiency anemia, unspecified: Secondary | ICD-10-CM

## 2019-09-19 DIAGNOSIS — R195 Other fecal abnormalities: Secondary | ICD-10-CM | POA: Diagnosis not present

## 2019-09-19 NOTE — Patient Instructions (Signed)
You have been scheduled for an endoscopy and colonoscopy. Please follow the written instructions given to you at your visit today. Please pick up your prep supplies at the pharmacy within the next 1-3 days. If you use inhalers (even only as needed), please bring them with you on the day of your procedure.  

## 2019-09-19 NOTE — Progress Notes (Addendum)
HISTORY OF PRESENT ILLNESS:  [name] Cooper is a 69 y.o. 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  She is sent today by her primary care provider regarding iron deficiency anemia and Hemoccult positive stool.  The patient did undergo colonoscopy in June 2017 and was found to have a nonadenomatous colon polyp and sigmoid diverticulosis for which follow-up in 10 years was recommended.  Review of laboratories from September 2020 revealed normal hemoglobin of 12.6 with MCV 72.  However in April 2021, with complaints of fatigue, her hemoglobin was 9.7 with MCV 67.6.  She has had multiple iron infusion therapies (4), since.  Most recent blood work from 09/14/2019 shows hemoglobin 11.5 and MCV 71.3.  Patient feels less fatigue.  She did have 1 of 3 Hemoccult studies returned positive.  She does have a history of chronic GERD at least once per week but is on no therapy.  No dysphagia.  No weight loss.  She denies melena or hematochezia though her stools have been darker since she is also been on oral iron.  No family history of colon cancer.  She has been vaccinated to Covid.  REVIEW OF SYSTEMS:  All non-GI ROS negative except for  Past Medical History:  Diagnosis Date  . Cancer ([place])   . Chicken pox   . Chronic lower back pain   . Diabetes ([place])   . GERD (gastroesophageal reflux disease)   . Hepatitis 1970's  . History of recurrent UTIs   . Hyperlipemia   . Slow heart rate dx'd 09/24/2014  . Urine incontinence     Past Surgical History:  Procedure Laterality Date  . ABDOMINAL HYSTERECTOMY  1984  . APPENDECTOMY    . BREAST BIOPSY Right 1996  . BREAST LUMPECTOMY Right 1996  . LEFT HEART CATH AND CORONARY ANGIOGRAPHY N/A 01/13/2019   Procedure: LEFT HEART CATH AND CORONARY ANGIOGRAPHY;  Surgeon: Nelva Bush, MD;  Location: [facility];  Service: Cardiovascular;  Laterality: N/A;  . MASTECTOMY Right 1996  . TONSILLECTOMY      Social  History [name] Cooper  reports that she has quit smoking. Her smoking use included cigarettes. She has a 35.25 pack-year smoking history. She has never used smokeless tobacco. She reports that she does not drink alcohol or use drugs.  family history includes Arthritis in her brother; Cancer in her mother; Early death in her mother.  Allergies  Allergen Reactions  . Lisinopril Anaphylaxis  . Codeine     REACTION: MAKES HER SLEEPY       PHYSICAL EXAMINATION: Vital signs: BP 114/80   Pulse 76   Ht 5\' 6"  (1.676 m)   Wt 291 lb 12.8 oz (132.4 kg)   LMP  (LMP Unknown)   BMI 47.10 kg/m   Constitutional: Obese and somewhat unhealthy appearing, no acute distress.  Smells of tobacco Psychiatric: alert and oriented x3, cooperative Eyes: extraocular movements intact, anicteric, conjunctiva pink Mouth: oral pharynx moist, no lesions Neck: supple no lymphadenopathy Cardiovascular: heart regular rate and rhythm, no murmur Lungs: clear to auscultation bilaterally Abdomen: soft, obese, nontender, nondistended, no obvious ascites, no peritoneal signs, normal bowel sounds, no organomegaly Rectal: Deferred until colonoscopy Extremities: no clubbing, cyanosis, or lower extremity edema bilaterally Skin: no lesions on visible extremities Neuro: No focal deficits.  Cranial nerves intact  ASSESSMENT:  1.  Iron deficiency anemia.  Anemia has responded to iron replacement therapy.  Rule out underlying GI mucosal lesion 2.  Hemoccult positive stool.  Rule out underlying GI mucosal lesion 3.  Chronic GERD.  Untreated 4.  Colonoscopy 2017 with nonadenomatous colon polyp and diverticulosis   PLAN:  1.  Schedule colonoscopy and upper endoscopy to evaluate for GI mucosal lesions.  Patient is high risk given her comorbidities and body habitus.The nature of the procedure, as well as the risks, benefits, and alternatives were carefully and thoroughly reviewed with the patient. Ample time for discussion and  questions allowed. The patient understood, was satisfied, and agreed to proceed. 2.  May benefit from GERD treatment.  To be determined 3.  Continue iron supplementation

## 2019-09-19 NOTE — Telephone Encounter (Signed)
Scheduled appts per 6/1 sch msg. Pt confirmed appt date and time.  

## 2019-09-20 ENCOUNTER — Ambulatory Visit (INDEPENDENT_AMBULATORY_CARE_PROVIDER_SITE_OTHER): Payer: Medicare Other | Admitting: Podiatry

## 2019-09-20 ENCOUNTER — Other Ambulatory Visit: Payer: Self-pay

## 2019-09-20 ENCOUNTER — Encounter: Payer: Self-pay | Admitting: Podiatry

## 2019-09-20 VITALS — Temp 97.1°F

## 2019-09-20 DIAGNOSIS — B351 Tinea unguium: Secondary | ICD-10-CM

## 2019-09-20 DIAGNOSIS — M79674 Pain in right toe(s): Secondary | ICD-10-CM

## 2019-09-20 DIAGNOSIS — M79675 Pain in left toe(s): Secondary | ICD-10-CM

## 2019-09-20 DIAGNOSIS — E119 Type 2 diabetes mellitus without complications: Secondary | ICD-10-CM

## 2019-09-20 NOTE — Patient Instructions (Addendum)
Diabetes Mellitus and Foot Care Foot care is an important part of your health, especially when you have diabetes. Diabetes may cause you to have problems because of poor blood flow (circulation) to your feet and legs, which can cause your skin to:  Become thinner and drier.  Break more easily.  Heal more slowly.  Peel and crack. You may also have nerve damage (neuropathy) in your legs and feet, causing decreased feeling in them. This means that you may not notice minor injuries to your feet that could lead to more serious problems. Noticing and addressing any potential problems early is the best way to prevent future foot problems. How to care for your feet Foot hygiene  Wash your feet daily with warm water and mild soap. Do not use hot water. Then, pat your feet and the areas between your toes until they are completely dry. Do not soak your feet as this can dry your skin.  Trim your toenails straight across. Do not dig under them or around the cuticle. File the edges of your nails with an emery board or nail file.  Apply a moisturizing lotion or petroleum jelly to the skin on your feet and to dry, brittle toenails. Use lotion that does not contain alcohol and is unscented. Do not apply lotion between your toes. Shoes and socks  Wear clean socks or stockings every day. Make sure they are not too tight. Do not wear knee-high stockings since they may decrease blood flow to your legs.  Wear shoes that fit properly and have enough cushioning. Always look in your shoes before you put them on to be sure there are no objects inside.  To break in new shoes, wear them for just a few hours a day. This prevents injuries on your feet. Wounds, scrapes, corns, and calluses  Check your feet daily for blisters, cuts, bruises, sores, and redness. If you cannot see the bottom of your feet, use a mirror or ask someone for help.  Do not cut corns or calluses or try to remove them with medicine.  If you  find a minor scrape, cut, or break in the skin on your feet, keep it and the skin around it clean and dry. You may clean these areas with mild soap and water. Do not clean the area with peroxide, alcohol, or iodine.  If you have a wound, scrape, corn, or callus on your foot, look at it several times a day to make sure it is healing and not infected. Check for: ? Redness, swelling, or pain. ? Fluid or blood. ? Warmth. ? Pus or a bad smell. General instructions  Do not cross your legs. This may decrease blood flow to your feet.  Do not use heating pads or hot water bottles on your feet. They may burn your skin. If you have lost feeling in your feet or legs, you may not know this is happening until it is too late.  Protect your feet from hot and cold by wearing shoes, such as at the beach or on hot pavement.  Schedule a complete foot exam at least once a year (annually) or more often if you have foot problems. If you have foot problems, report any cuts, sores, or bruises to your health care provider immediately. Contact a health care provider if:  You have a medical condition that increases your risk of infection and you have any cuts, sores, or bruises on your feet.  You have an injury that is not   healing.  You have redness on your legs or feet.  You feel burning or tingling in your legs or feet.  You have pain or cramps in your legs and feet.  Your legs or feet are numb.  Your feet always feel cold.  You have pain around a toenail. Get help right away if:  You have a wound, scrape, corn, or callus on your foot and: ? You have pain, swelling, or redness that gets worse. ? You have fluid or blood coming from the wound, scrape, corn, or callus. ? Your wound, scrape, corn, or callus feels warm to the touch. ? You have pus or a bad smell coming from the wound, scrape, corn, or callus. ? You have a fever. ? You have a red line going up your leg. Summary  Check your feet every day  for cuts, sores, red spots, swelling, and blisters.  Moisturize feet and legs daily.  Wear shoes that fit properly and have enough cushioning.  If you have foot problems, report any cuts, sores, or bruises to your health care provider immediately.  Schedule a complete foot exam at least once a year (annually) or more often if you have foot problems. This information is not intended to replace advice given to you by your health care provider. Make sure you discuss any questions you have with your health care provider. Document Revised: 12/28/2018 Document Reviewed: 05/08/2016 Elsevier Patient Education  2020 Elsevier Inc.  

## 2019-09-21 ENCOUNTER — Encounter: Payer: Self-pay | Admitting: Cardiology

## 2019-09-21 ENCOUNTER — Ambulatory Visit (INDEPENDENT_AMBULATORY_CARE_PROVIDER_SITE_OTHER): Payer: Medicare Other | Admitting: Cardiology

## 2019-09-21 VITALS — BP 120/80 | HR 81 | Ht 66.0 in | Wt 291.4 lb

## 2019-09-21 DIAGNOSIS — E782 Mixed hyperlipidemia: Secondary | ICD-10-CM | POA: Diagnosis not present

## 2019-09-21 DIAGNOSIS — I2510 Atherosclerotic heart disease of native coronary artery without angina pectoris: Secondary | ICD-10-CM | POA: Diagnosis not present

## 2019-09-21 DIAGNOSIS — I10 Essential (primary) hypertension: Secondary | ICD-10-CM

## 2019-09-21 NOTE — Patient Instructions (Addendum)
Medication Instructions:   Your physician recommends that you continue on your current medications as directed. Please refer to the Current Medication list given to you today.  *If you need a refill on your cardiac medications before your next appointment, please call your pharmacy*  Lab Work:  None ordered today  Testing/Procedures:  None ordered today  Follow-Up: At [facility], you and your health needs are our priority.  As part of our continuing mission to provide you with exceptional heart care, we have created designated Provider Care Teams.  These Care Teams include your primary Cardiologist (physician) and Advanced Practice Providers (APPs -  Physician Assistants and Nurse Practitioners) who all work together to provide you with the care you need, when you need it.  On 03/06/20 at 9:40AM with Candee Furbish, MD

## 2019-09-24 NOTE — Progress Notes (Signed)
Subjective: [name] Cooper is a pleasant 69 y.o. female patient seen today preventative diabetic foot care and painful mycotic nails b/l that are difficult to trim. Pain interferes with ambulation. Aggravating factors include wearing enclosed shoe gear. Pain is relieved with periodic professional debridement.  Patient Active Problem List   Diagnosis Date Noted  . Heme positive stool 09/14/2019  . Iron deficiency anemia 07/31/2019  . Diabetes mellitus without complication ([place]) 02/40/9735  . Dyspnea on exertion 01/13/2019  . Coronary artery calcification 01/13/2019  . Morbid (severe) obesity due to excess calories ([place]) 10/12/2018  . History of tobacco abuse 10/12/2018  . Cough 10/11/2018  . Microcytic anemia 06/15/2018  . History of right mastectomy 06/15/2018  . Vitamin D deficiency 06/15/2018  . Emphysema of lung ([employer]) 06/15/2018  . Urine, incontinence, stress female 06/15/2018  . Estrogen deficiency 03/30/2016  . Rhinitis, allergic   . HTN (hypertension) 09/24/2014  . Hyperlipidemia 09/24/2014  . OA (osteoarthritis) 09/24/2014  . History of solitary pulmonary nodule 03/21/2008  . History of right breast cancer 03/21/2008    Current Outpatient Medications on File Prior to Visit  Medication Sig Dispense Refill  . acetaminophen (TYLENOL) 650 MG CR tablet Take 1,300 mg by mouth every 8 (eight) hours as needed for pain. Reported on 09/23/2015    . albuterol (PROVENTIL HFA;VENTOLIN HFA) 108 (90 Base) MCG/ACT inhaler Inhale 2 puffs into the lungs every 6 (six) hours as needed for wheezing or shortness of breath. 1 Inhaler 3  . aspirin EC 81 MG tablet Take 1 tablet (81 mg total) by mouth daily. 90 tablet 3  . FeFum-FePoly-FA-B Cmp-C-Biot (INTEGRA PLUS) CAPS Take 1 capsule by mouth every morning. 30 capsule 2  . hydrALAZINE (APRESOLINE) 25 MG tablet TAKE 1 TABLET BY MOUTH 3  TIMES DAILY 270 tablet 2  . isosorbide mononitrate (IMDUR) 30 MG 24 hr tablet Take 1 tablet (30 mg total) by mouth  daily. 30 tablet 11  . linagliptin (TRADJENTA) 5 MG TABS tablet Take 1 tablet (5 mg total) by mouth daily. 90 tablet 3  . Multiple Vitamin (MULTIVITAMIN WITH MINERALS) TABS tablet Take 1 tablet by mouth daily.    . nitroGLYCERIN (NITROSTAT) 0.4 MG SL tablet Place 1 tablet (0.4 mg total) under the tongue every 5 (five) minutes as needed for chest pain. 25 tablet prn  . triamterene-hydrochlorothiazide (MAXZIDE-25) 37.5-25 MG tablet Take 1 tablet by mouth daily. 90 tablet 1  . Vitamin D, Ergocalciferol, (DRISDOL) 1.25 MG (50000 UNIT) CAPS capsule One capsule by mouth once a week for 12 weeks. Then take 2000IU/day 12 capsule 0  . rosuvastatin (CRESTOR) 20 MG tablet Take 1 tablet (20 mg total) by mouth daily. 90 tablet 3   No current facility-administered medications on file prior to visit.    Allergies  Allergen Reactions  . Lisinopril Anaphylaxis  . Codeine     REACTION: MAKES HER SLEEPY    Objective: Physical Exam  General: [name] Cooper is a pleasant 69 y.o.  African American female, in NAD. AAO x 3.   Vascular:  Neurovascular status unchanged b/l lower extremities. Capillary refill time to digits immediate b/l. Palpable DP pulses b/l. Palpable PT pulses b/l. Pedal hair sparse b/l. Skin temperature gradient within normal limits b/l.  Dermatological:  Pedal skin with normal turgor, texture and tone bilaterally. No open wounds bilaterally. No interdigital macerations bilaterally. Toenails 1-5 right, L 2nd toe, L 3rd toe, L 4th toe and L 5th toe elongated, discolored, dystrophic, thickened, and crumbly with subungual debris and  tenderness to dorsal palpation. Anonychia noted L hallux. Nailbed(s) epithelialized.   Musculoskeletal:  Normal muscle strength 5/5 to all lower extremity muscle groups bilaterally. No pain crepitus or joint limitation noted with ROM b/l. No gross bony deformities bilaterally.  Neurological:  Protective sensation intact 5/5 intact bilaterally with 10g  monofilament b/l. Vibratory sensation intact b/l.  Assessment and Plan:  1. Pain due to onychomycosis of toenails of both feet   2. Diabetes mellitus without complication ([place])    -Examined patient. -Toenails 1-5 right, L 2nd toe, L 3rd toe, L 4th toe and L 5th toe debrided in length and girth without iatrogenic bleeding with sterile nail nipper and dremel.  -Patient to continue soft, supportive shoe gear daily. -Patient to report any pedal injuries to medical professional immediately. -Patient/POA to call should there be question/concern in the interim.  Return in about 3 months (around 12/21/2019) for diabetic nail trim.  Marzetta Board, DPM

## 2019-10-03 ENCOUNTER — Other Ambulatory Visit: Payer: Self-pay | Admitting: Family Medicine

## 2019-10-03 DIAGNOSIS — Z1231 Encounter for screening mammogram for malignant neoplasm of breast: Secondary | ICD-10-CM

## 2019-10-09 ENCOUNTER — Other Ambulatory Visit: Payer: Self-pay

## 2019-10-09 ENCOUNTER — Encounter: Payer: Self-pay | Admitting: Family Medicine

## 2019-10-09 ENCOUNTER — Ambulatory Visit (INDEPENDENT_AMBULATORY_CARE_PROVIDER_SITE_OTHER): Payer: Medicare Other | Admitting: Family Medicine

## 2019-10-09 VITALS — BP 118/68 | HR 59 | Temp 97.9°F | Ht 66.0 in | Wt 290.4 lb

## 2019-10-09 DIAGNOSIS — M25511 Pain in right shoulder: Secondary | ICD-10-CM

## 2019-10-09 DIAGNOSIS — R300 Dysuria: Secondary | ICD-10-CM | POA: Diagnosis not present

## 2019-10-09 DIAGNOSIS — E119 Type 2 diabetes mellitus without complications: Secondary | ICD-10-CM | POA: Diagnosis not present

## 2019-10-09 DIAGNOSIS — D508 Other iron deficiency anemias: Secondary | ICD-10-CM | POA: Diagnosis not present

## 2019-10-09 DIAGNOSIS — E559 Vitamin D deficiency, unspecified: Secondary | ICD-10-CM | POA: Diagnosis not present

## 2019-10-09 DIAGNOSIS — N390 Urinary tract infection, site not specified: Secondary | ICD-10-CM

## 2019-10-09 LAB — COMPREHENSIVE METABOLIC PANEL
ALT: 10 U/L (ref 0–35)
AST: 15 U/L (ref 0–37)
Albumin: 4.2 g/dL (ref 3.5–5.2)
Alkaline Phosphatase: 81 U/L (ref 39–117)
BUN: 18 mg/dL (ref 6–23)
CO2: 28 mEq/L (ref 19–32)
Calcium: 10.5 mg/dL (ref 8.4–10.5)
Chloride: 100 mEq/L (ref 96–112)
Creatinine, Ser: 1.25 mg/dL — ABNORMAL HIGH (ref 0.40–1.20)
GFR: 51.29 mL/min — ABNORMAL LOW (ref >60.00)
Glucose, Bld: 93 mg/dL (ref 70–99)
Potassium: 3.6 mEq/L (ref 3.5–5.1)
Sodium: 138 mEq/L (ref 135–145)
Total Bilirubin: 0.5 mg/dL (ref 0.2–1.2)
Total Protein: 7.5 g/dL (ref 6.0–8.3)

## 2019-10-09 LAB — CBC WITH DIFFERENTIAL/PLATELET
Basophils Absolute: 0.1 10*3/uL (ref 0.0–0.1)
Basophils Relative: 1 % (ref 0.0–3.0)
Eosinophils Absolute: 0.5 10*3/uL (ref 0.0–0.7)
Eosinophils Relative: 5.1 % — ABNORMAL HIGH (ref 0.0–5.0)
HCT: 37.8 % (ref 36.0–46.0)
Hemoglobin: 11.8 g/dL — ABNORMAL LOW (ref 12.0–15.0)
Lymphocytes Relative: 40.3 % (ref 12.0–46.0)
Lymphs Abs: 3.7 10*3/uL (ref 0.7–4.0)
MCHC: 31.3 g/dL (ref 30.0–36.0)
MCV: 70.4 fl — ABNORMAL LOW (ref 78.0–100.0)
Monocytes Absolute: 0.7 10*3/uL (ref 0.1–1.0)
Monocytes Relative: 7.9 % (ref 3.0–12.0)
Neutro Abs: 4.2 10*3/uL (ref 1.4–7.7)
Neutrophils Relative %: 45.7 % (ref 43.0–77.0)
Platelets: 260 10*3/uL (ref 150.0–400.0)
RBC: 5.37 Mil/uL — ABNORMAL HIGH (ref 3.87–5.11)
RDW: 23.2 % — ABNORMAL HIGH (ref 11.5–15.5)
WBC: 9.2 10*3/uL (ref 4.0–10.5)

## 2019-10-09 LAB — POCT URINALYSIS DIPSTICK
Bilirubin, UA: NEGATIVE
Blood, UA: NEGATIVE
Glucose, UA: NEGATIVE
Ketones, UA: NEGATIVE
Nitrite, UA: POSITIVE
Protein, UA: NEGATIVE
Spec Grav, UA: 1.015 (ref 1.010–1.025)
Urobilinogen, UA: 0.2 E.U./dL
pH, UA: 6 (ref 5.0–8.0)

## 2019-10-09 LAB — VITAMIN D 25 HYDROXY (VIT D DEFICIENCY, FRACTURES): VITD: 40.86 ng/mL (ref 30.00–100.00)

## 2019-10-09 LAB — HEMOGLOBIN A1C: Hgb A1c MFr Bld: 6.3 % (ref 4.6–6.5)

## 2019-10-09 MED ORDER — CEFUROXIME AXETIL 500 MG PO TABS: 500.0000 mg | ORAL_TABLET | Freq: Two times a day (BID) | ORAL | 0 refills | Status: DC

## 2019-10-09 NOTE — Progress Notes (Signed)
Patient: [name] Cooper MRN: [id number] DOB: [birth year] PCP: [name], MD     Subjective:  Chief Complaint  Patient presents with  . Diabetes  . iron deficiency anemia  . Osteoarthritis    Right Shoulder    HPI: The patient is a 69 y.o. female who presents today for Diabetes. She complains of right shoulder pain, and says that she has a UTI every time she comes into the office.  Diabetes:  diagnosed from prediabetes to diabetes last December (03/2019). Stopped her metformin due to increased creatinine and started her on tradjenta. Her a1c was 7.1. she has had no issues with the new medication. She has started walking and has lost about 3-4 pounds. She is trying to follow a diabetic diet. I gave her 3 months to work on diet/exericse and weight loss. She had a medicare visit at home and her a1c was 6.3.   Anemia -referred to hematology. Has responded well to IV infusion and needs scope with heme positive stool. Continues to be followed.   CAD Followed by cardiology.   Right shoulder pain She has known OA in her right shoulder. She states it has really been hurting her lately. She denies any precipitating factors or trauma.  She has not taken anything for the pain. Pain rated as a 7/10 most of the time. Pain is sharp in nature and if she moves it she hears a pop and it makes the pain worse. Pain is worse across the chest. She has had an injection in her shoulder in the past and this helped her for awhile. She is right handed.   She wonders if she has another urine infection. She had one in march which we treated her for and she states she feels good for a month and then her symptoms come back. She does not have sex. She does have stress incontinence and wears a pad for this. She has never seen a urologist for frequent UTIs. She thinks she has had about 3-4 urine infections in the last year.   Review of Systems  Constitutional: Negative for fatigue and fever.  Respiratory:  Negative for cough, shortness of breath and wheezing.   Cardiovascular: Negative for chest pain and palpitations.  Gastrointestinal: Negative for abdominal pain, diarrhea, nausea and vomiting.  Genitourinary: Positive for frequency and urgency. Negative for flank pain, hematuria, pelvic pain and vaginal discharge.  Musculoskeletal: Positive for back pain.  Neurological: Positive for headaches. Negative for dizziness and light-headedness.    Allergies Patient is allergic to lisinopril and codeine.  Past Medical History Patient  has a past medical history of Cancer ([place]), Chicken pox, Chronic lower back pain, Diabetes ([place]), GERD (gastroesophageal reflux disease), Hepatitis (1970's), History of recurrent UTIs, Hyperlipemia, Slow heart rate (dx'd 09/24/2014), and Urine incontinence.  Surgical History Patient  has a past surgical history that includes Tonsillectomy; Appendectomy; Abdominal hysterectomy (1984); Mastectomy (Right, 1996); Breast biopsy (Right, 1996); Breast lumpectomy (Right, 1996); and LEFT HEART CATH AND CORONARY ANGIOGRAPHY (N/A, 01/13/2019).  Family History Pateint's family history includes Arthritis in her brother; Cancer in her mother; Early death in her mother.  Social History Patient  reports that she has quit smoking. Her smoking use included cigarettes. She has a 35.25 pack-year smoking history. She has never used smokeless tobacco. She reports that she does not drink alcohol and does not use drugs.    Objective: Vitals:   10/09/19 0940  BP: 118/68  Pulse: (!) 59  Temp: 97.9 F (36.6 C)  TempSrc: Temporal  SpO2: 93%  Weight: 290 lb 6.4 oz (131.7 kg)  Height: 5\' 6"  (1.676 m)    Body mass index is 46.87 kg/m.  Physical Exam Vitals reviewed.  Constitutional:      Appearance: She is well-developed. She is obese.     Comments: Tobacco odor.   HENT:     Head: Normocephalic and atraumatic.     Right Ear: Tympanic membrane, ear canal and external ear normal.      Left Ear: Tympanic membrane, ear canal and external ear normal.  Eyes:     Conjunctiva/sclera: Conjunctivae normal.     Pupils: Pupils are equal, round, and reactive to light.  Neck:     Thyroid: No thyromegaly.  Cardiovascular:     Rate and Rhythm: Normal rate and regular rhythm.     Heart sounds: Normal heart sounds. No murmur heard.   Pulmonary:     Effort: Pulmonary effort is normal.     Breath sounds: Normal breath sounds.  Abdominal:     General: Bowel sounds are normal. There is no distension.     Palpations: Abdomen is soft.     Tenderness: There is no abdominal tenderness.  Musculoskeletal:     Cervical back: Normal range of motion and neck supple.  Lymphadenopathy:     Cervical: No cervical adenopathy.  Skin:    General: Skin is warm and dry.     Findings: No rash.  Neurological:     General: No focal deficit present.     Mental Status: She is alert and oriented to person, place, and time.     Cranial Nerves: No cranial nerve deficit.     Coordination: Coordination normal.     Deep Tendon Reflexes: Reflexes normal.  Psychiatric:        Mood and Affect: Mood normal.        Behavior: Behavior normal.      Office Visit from 10/09/2019 in [place]  PHQ-2 Total Score 0         Assessment/plan: 1. Diabetes mellitus without complication ([place]) Recheck a1c today. Hopefully she is better controlled so we don't have to add on another agent.  -utd on eye/foot exam -utd on vaccines -on statin  -f/u in 3-6 months.   - Hemoglobin A1c - Comprehensive metabolic panel  2. Acute pain of right shoulder I have no access to xray here so I can inject her. She would also like xray first so we will refer to sports medicine for imaging and possible injection.  - Ambulatory referral to Sports Medicine  3. Dysuria Checking ua/culture. If positive will see if I can figure out how many urinary tract infections she has had and refer to urology if needed.  She states she is not sexually active.  - POCT urinalysis dipstick - Urine Culture  4. Other iron deficiency anemia Continue to follow with heme.  - CBC with Differential/Platelet  5. Vitamin D deficiency Recheck today. Has been on replacement.  - VITAMIN D 25 Hydroxy (Vit-D Deficiency, Fractures)    This visit occurred during the SARS-CoV-2 public health emergency.  Safety protocols were in place, including screening questions prior to the visit, additional usage of staff PPE, and extensive cleaning of exam room while observing appropriate contact time as indicated for disinfecting solutions.     Return in about 6 months (around 04/09/2020) for diabetes. [name], MD [place]   10/09/2019

## 2019-10-09 NOTE — Patient Instructions (Addendum)
1) checking urine culture. If positive will treat and then may send you to urology for frequent urine infections.   2) for your shoulder.. I would inject you but do not have xray here today so I will do referral to sports medicine. They should see you within week or two. Expect a phone call.   3) checking a1c to see how your diabetes is doing. Keep up the good work with weight loss and diet and exercise. If controlled will see you in 6 months.   4) continue to follow hematology and heart doctor.   Good to see you!  Dr. Rogers Blocker

## 2019-10-10 ENCOUNTER — Encounter: Payer: Self-pay | Admitting: Family Medicine

## 2019-10-10 ENCOUNTER — Ambulatory Visit: Payer: Self-pay

## 2019-10-10 ENCOUNTER — Ambulatory Visit (INDEPENDENT_AMBULATORY_CARE_PROVIDER_SITE_OTHER): Payer: Medicare Other | Admitting: Family Medicine

## 2019-10-10 ENCOUNTER — Ambulatory Visit (INDEPENDENT_AMBULATORY_CARE_PROVIDER_SITE_OTHER): Payer: Medicare Other

## 2019-10-10 VITALS — BP 110/78 | HR 71 | Ht 66.0 in | Wt 290.6 lb

## 2019-10-10 DIAGNOSIS — M25511 Pain in right shoulder: Secondary | ICD-10-CM | POA: Diagnosis not present

## 2019-10-10 DIAGNOSIS — M19011 Primary osteoarthritis, right shoulder: Secondary | ICD-10-CM | POA: Diagnosis not present

## 2019-10-10 DIAGNOSIS — G8929 Other chronic pain: Secondary | ICD-10-CM | POA: Diagnosis not present

## 2019-10-10 NOTE — Patient Instructions (Addendum)
You had a R shoulder injection today.  Call or go to the ER if you develop a large red swollen joint with extreme pain or oozing puss.   Get xray today on your way out.  Recheck with me if not better  Ok to do this shot again in the future if needed.

## 2019-10-10 NOTE — Progress Notes (Signed)
Subjective:    CC: R shoulder pain  I, Molly Weber, LAT, ATC, am serving as scribe for Dr. Lynne Leader.  HPI: Pt is a 69 y/o female presenting w/ c/o chronic R shoulder pain.  She has been previously diagnosed w/ R shoulder OA per pt.  She locates her pain to her entire R shoulder.  She rates her pain at a 7/10 and describes her pain as sharp.  Radiating pain:  intermittently R shoulder mechanical symptoms: yes Aggravating factors: overhead shoulder AROM; functional IR; rainy weather Treatments tried: prior R shoulder injection at [facility] about 4 years ago; Voltaren; Tylenol  Pertinent review of Systems: No fevers or chills  Relevant historical information: Hypertension, CAD, emphysema, diabetes.  Apparent history of shoulder arthritis managed by Dr. Mardelle Matte at [place] in the past with steroid injection.   Objective:    Vitals:   10/10/19 1349  BP: 110/78  Pulse: 71  SpO2: 94%   General: Well Developed, well nourished, and in no acute distress.   MSK: Right shoulder normal-appearing Decreased range of motion.  Abduction 130 degrees external rotation full internal rotation iliac crest.  Significant crepitations palpated and heard with motion. Strength 4/5 abduction with pain.  Normal external and internal rotation without significant pain. Unable to get into adequate positioning for impingement testing. Pulses capillary fill and sensation are intact distally.  Lab and Radiology Results  X-ray images right shoulder obtained today personally and independently reviewed Mild arthritis changes seen in glenohumeral joint.  Humeral head is high riding indicating possible chronic rotator cuff tear.  Await formal radiology review  Diagnostic Limited MSK Ultrasound of: Right shoulder No full-thickness retracted rotator cuff tear is present and subscapularis supraspinatus and infraspinatus muscle.  Subacromial bursitis present on exam with significant  AC DJD present. Impression: Subacromial bursitis  Procedure: Real-time Ultrasound Guided Injection of right shoulder glenohumeral joint Device: Philips Affiniti 50G Images permanently stored and available for review in the ultrasound unit. Verbal informed consent obtained.  Discussed risks and benefits of procedure. Warned about infection bleeding damage to structures skin hypopigmentation and fat atrophy among others. Patient expresses understanding and agreement Time-out conducted.   Noted no overlying erythema, induration, or other signs of local infection.   Skin prepped in a sterile fashion.   Local anesthesia: Topical Ethyl chloride.   With sterile technique and under real time ultrasound guidance:  40 mg of Kenalog and 2 mL of Marcaine injected easily.   Completed without difficulty   Pain immediately resolved suggesting accurate placement of the medication.   Advised to call if fevers/chills, erythema, induration, drainage, or persistent bleeding.   Images permanently stored and available for review in the ultrasound unit.  Impression: Technically successful ultrasound guided injection.       Impression and Recommendations:    Assessment and Plan: 69 y.o. female with right shoulder pain.  Patient has quite a bit of shoulder dysfunction.  X-ray today obtained after the visit did not show severe glenohumeral DJD.  Plan for injection as above and trial of physical therapy.  She should benefit significantly from this.  If not improving would proceed with advanced imaging..  Recheck in 6 weeks.  Will contact pt to inform her of xray and plan for PT.   PDMP not reviewed this encounter. Orders Placed This Encounter  Procedures  . [place] LIMITED JOINT SPACE STRUCTURES UP RIGHT(NO LINKED CHARGES)    Order Specific Question:   Reason for Exam (SYMPTOM  OR  DIAGNOSIS REQUIRED)    Answer:   R shoulder pain    Order Specific Question:   Preferred imaging location?    Answer:   [place]  . DG Shoulder Right    Standing Status:   Future    Number of Occurrences:   1    Standing Expiration Date:   10/09/2020    Order Specific Question:   Reason for Exam (SYMPTOM  OR DIAGNOSIS REQUIRED)    Answer:   eval shoulder pain r    Order Specific Question:   Preferred imaging location?    Answer:   [facility]    Order Specific Question:   Radiology Contrast Protocol - do NOT remove file path    Answer:   [email or web address]   No orders of the defined types were placed in this encounter.   Discussed warning signs or symptoms. Please see discharge instructions. Patient expresses understanding.   The above documentation has been reviewed and is accurate and complete Lynne Leader, M.D.

## 2019-10-11 ENCOUNTER — Encounter: Payer: Self-pay | Admitting: Family Medicine

## 2019-10-11 DIAGNOSIS — N183 Chronic kidney disease, stage 3 unspecified: Secondary | ICD-10-CM | POA: Insufficient documentation

## 2019-10-11 LAB — URINE CULTURE
MICRO NUMBER:: 10614311
SPECIMEN QUALITY:: ADEQUATE

## 2019-10-12 NOTE — Progress Notes (Signed)
Xray right shoulder shows a little arthritis. Otherwise looks pretty normal.

## 2019-10-17 ENCOUNTER — Encounter: Payer: Self-pay | Admitting: Physical Therapy

## 2019-10-17 ENCOUNTER — Ambulatory Visit: Payer: Medicare Other | Attending: Family Medicine | Admitting: Physical Therapy

## 2019-10-17 ENCOUNTER — Other Ambulatory Visit: Payer: Self-pay

## 2019-10-17 DIAGNOSIS — R600 Localized edema: Secondary | ICD-10-CM | POA: Diagnosis not present

## 2019-10-17 DIAGNOSIS — G8929 Other chronic pain: Secondary | ICD-10-CM | POA: Diagnosis not present

## 2019-10-17 DIAGNOSIS — M25511 Pain in right shoulder: Secondary | ICD-10-CM | POA: Insufficient documentation

## 2019-10-17 DIAGNOSIS — M6281 Muscle weakness (generalized): Secondary | ICD-10-CM | POA: Diagnosis not present

## 2019-10-17 NOTE — Therapy (Signed)
[place], [place], [address] Phone: [phone]   Fax:  [phone]  Physical Therapy Evaluation  Patient Details  Name: [name] Cooper MRN: [id number] Date of Birth: [birth year] Referring Provider (PT): Gregor Hams, MD    Encounter Date: 10/17/2019   PT End of Session - 10/17/19 0953    Visit Number 1    Number of Visits 13    Date for PT Re-Evaluation 11/28/19    PT Start Time 0950    PT Stop Time 1038    PT Time Calculation (min) 48 min    Activity Tolerance Patient tolerated treatment well;Patient limited by pain    Behavior During Therapy [facility] for tasks assessed/performed           Past Medical History:  Diagnosis Date  . Cancer ([place])    breast and ovarian  . Chicken pox   . Chronic lower back pain   . Diabetes ([place])   . GERD (gastroesophageal reflux disease)   . Hepatitis 1970's  . History of recurrent UTIs   . Hyperlipemia   . Slow heart rate dx'd 09/24/2014  . Urine incontinence     Past Surgical History:  Procedure Laterality Date  . ABDOMINAL HYSTERECTOMY  1984  . APPENDECTOMY    . BREAST BIOPSY Right 1996  . BREAST LUMPECTOMY Right 1996  . LEFT HEART CATH AND CORONARY ANGIOGRAPHY N/A 01/13/2019   Procedure: LEFT HEART CATH AND CORONARY ANGIOGRAPHY;  Surgeon: Nelva Bush, MD;  Location: [place] [facility];  Service: Cardiovascular;  Laterality: N/A;  . MASTECTOMY Right 1996  . TONSILLECTOMY      There were no vitals filed for this visit.    Subjective Assessment - 10/17/19 0956    Subjective pt is a 69 y.o F with hx or R shoulder pain that she was previously seen for in PT which she noted completely resolved the pain. She notes pain started 4-5 months ago with no specifc onset or MOI. Pain stays in the shoulder and occasionally to the wrist/ hand. since onset she reports no changes in pain. she got a shot in the shoulder on 10/11/2019 and reports no improvement of pain.      Pertinent History R breast mastectomy 1996    Limitations Lifting    How long can you sit comfortably? unlimited    How long can you stand comfortably? unlimited    How long can you walk comfortably? unlimited    Diagnostic tests 6 /23/2021 x-ray IMPRESSION:No acute osseous abnormality.  Degenerative changes    Patient Stated Goals make the arm stop hurting    Currently in Pain? Yes    Pain Score 4    at worst 9/10.   Pain Location Shoulder    Pain Orientation Right;Anterior;Posterior;Lateral    Pain Descriptors / Indicators Sharp    Pain Type Chronic pain    Pain Onset More than a month ago    Pain Frequency Constant    Aggravating Factors  unsure its just always there    Pain Relieving Factors tylenol,              [facility] PT Assessment - 10/17/19 1012      Assessment   Medical Diagnosis Chronic right shoulder pain M25.511, G89.29    Referring Provider (PT) Gregor Hams, MD     Onset Date/Surgical Date --   4-5 months ago   Hand Dominance Right    Next MD Visit make one  PRN    Prior Therapy yes   for R shoulder at this location     Precautions   Precautions None      Restrictions   Weight Bearing Restrictions No      Balance Screen   Has the patient fallen in the past 6 months No    Has the patient had a decrease in activity level because of a fear of falling?  No    Is the patient reluctant to leave their home because of a fear of falling?  No      Home Environment   Living Environment Private residence    Living Arrangements Other relatives    Available Help at Discharge Family    Type of [place] Access Level entry    Home Layout One level      Prior Function   Level of [place] Retired    Leisure reading, playing games, watch grandchild      Cognition   Overall Cognitive Status Within Functional Limits for tasks assessed      Observation/Other Assessments   Focus on Therapeutic Outcomes (FOTO)  53% limited    39% limited predicted     Posture/Postural Control   Posture/Postural Control Postural limitations    Postural Limitations Rounded Shoulders;Forward head      ROM / Strength   AROM / PROM / Strength AROM;Strength;PROM      AROM   AROM Assessment Site Shoulder    Right/Left Shoulder Right;Left    Right Shoulder Extension 55 Degrees    Right Shoulder Flexion 98 Degrees   pain during motion   Right Shoulder ABduction 70 Degrees    Right Shoulder Internal Rotation --   L5   Right Shoulder External Rotation --   unable to reach head   Left Shoulder Extension 50 Degrees    Left Shoulder Flexion 135 Degrees    Left Shoulder ABduction 105 Degrees    Left Shoulder Internal Rotation --   T9   Left Shoulder External Rotation --   T2     PROM   Overall PROM Comments significant guarding noted during PROM assessment    PROM Assessment Site Shoulder    Right/Left Shoulder Right    Right Shoulder Flexion 105 Degrees    Right Shoulder ABduction 83 Degrees    Right Shoulder Internal Rotation 41 Degrees   with shoulder abducted to 45 degrees   Right Shoulder External Rotation 15 Degrees   with shoulder abducted to 45 degrees     Strength   Overall Strength Comments R shoulder strength limited due to pain and limited ROM   mulipotle popping noted during strength assessment   Strength Assessment Site Shoulder;Hand    Right/Left Shoulder Right;Left    Right Shoulder Flexion 3-/5    Right Shoulder Extension 3-/5    Right Shoulder ABduction 3-/5    Right Shoulder Internal Rotation 3-/5    Right Shoulder External Rotation 3-/5    Left Shoulder Flexion 4+/5    Left Shoulder Extension 4+/5    Left Shoulder ABduction 4+/5    Left Shoulder Internal Rotation 4+/5    Left Shoulder External Rotation 4/5    Right Hand Grip (lbs) 23.3   18,24,28   Left Hand Grip (lbs) 41   40,42,41     Palpation   Palpation comment TTP along sub-acromial space, the coracoid process, and long head of the bicep  tendon. mutliple trigger points  in the R upper trap/ levator scpauale and mild tenderness along the supra/ infraspinatus.       Special Tests    Special Tests Rotator Cuff Impingement    Rotator Cuff Impingment tests Michel Bickers test;Full Can test;Empty Can test;Painful Arc of Motion;other      Hawkins-Kennedy test   Findings Positive    Side Right      Empty Can test   Findings Unable to test      Full Can test   Findings Unable to test      Painful Arc of Motion   Findings Unable to test                      Objective measurements completed on examination: See above findings.               PT Education - 10/17/19 1011    Education Details evaluation findings, POC, goals, HEP with proper form/ rationale.    Person(s) Educated Patient    Methods Explanation;Verbal cues;Handout    Comprehension Verbalized understanding;Verbal cues required            PT Short Term Goals - 10/17/19 1051      PT SHORT TERM GOAL #1   Title pt to be I with inital HEP    Time 3    Period Weeks    Status New    Target Date 11/07/19      PT SHORT TERM GOAL #2   Title pt to verbalize/ demo efficient posture and lifting mechanics to reduce and prevent R shoulder pain    Baseline -    Time 3    Period Weeks    Status New    Target Date 11/07/19      PT SHORT TERM GOAL #3   Title increase grip strength in R hand by >/= 8# to demo improvement in shoulder function    Time 3    Period Weeks    Status New    Target Date 11/07/19             PT Long Term Goals - 10/17/19 1052      PT LONG TERM GOAL #1   Title increase R shoulder flexion/abduction to >/= 100 degrees  </= 2/10 pain for functional mobility required for ADLs    Time 6    Period Weeks    Status New    Target Date 11/28/19      PT LONG TERM GOAL #2   Title pt to be able to reach IR to >/= L3 for donning/ doffing bra, and ER to C3 to be able to brush hair with </= 3/10 pain    Time 6     Period Weeks    Status New    Target Date 11/28/19      PT LONG TERM GOAL #3   Title increase R shoulder strength to >/= 4-/5 in available ROM for functional strength to assist with ADLS    Baseline -    Time 6    Period Weeks    Status New    Target Date 11/28/19      PT LONG TERM GOAL #4   Title iincrease FOTO score to </= 39% limited to demo improvement in function    Time 6    Period Weeks    Status New    Target Date 11/28/19      PT LONG TERM GOAL #5   Title  pt to be I with all HEP given to maintain and progress current level of function ind    Baseline -    Time 6    Period Weeks    Status New    Target Date 11/28/19                  Plan - 10/17/19 1011    Clinical Impression Statement pt presents to OPPT with CC of R shoulder pain starting 4-5 months ago with no specific onset. She has been seen before at this location for R shoulder pain in 2018, and has a hx of R mastecomy which could be a contributing factor. She demonstrates limited R shoulder ROM and strength secondary to pain and guarding. limited testing due to irritability and severity of pain. She would benefit from physical therapy to decrease R shoulder pain, improve ROM/ strength, maximze overall function by addressing the deficits listed.    Personal Factors and Comorbidities Comorbidity 2    Comorbidities hx of Cx, DM,    Examination-Activity Limitations Lift    Stability/Clinical Decision Making Evolving/Moderate complexity    Clinical Decision Making Moderate    Rehab Potential Good    PT Frequency 2x / week    PT Duration 6 weeks    PT Treatment/Interventions ADLs/Self Care Home Management;Cryotherapy;Therapeutic activities;Therapeutic exercise;Balance training;Neuromuscular re-education;Manual techniques;Passive range of motion;Dry needling;Taping;Patient/family education;Vasopneumatic Device    PT Next Visit Plan (hx of Cx in R breast no MHP or e-stim modalities), provide pt FOTO handout,  PROM - > AAROM, STW for upper trap /levator scapulae, trial rotator cuff isometrics. ice for painPRN    PT Home Exercise Plan [id number] - upper trap stretch, levator scapuale stretch, scapular retraction, table slides flexion/ abduction    Consulted and Agree with Plan of Care Patient           Patient will benefit from skilled therapeutic intervention in order to improve the following deficits and impairments:  Improper body mechanics, Increased muscle spasms, Decreased strength, Pain, Postural dysfunction, Decreased activity tolerance, Decreased endurance, Decreased range of motion  Visit Diagnosis: Chronic right shoulder pain  Muscle weakness (generalized)  Localized edema     Problem List Patient Active Problem List   Diagnosis Date Noted  . CKD (chronic kidney disease) stage 3, GFR 30-59 ml/min 10/11/2019  . Heme positive stool 09/14/2019  . Iron deficiency anemia 07/31/2019  . Diabetes mellitus without complication ([place]) 50/93/2671  . Dyspnea on exertion 01/13/2019  . Coronary artery calcification 01/13/2019  . Morbid (severe) obesity due to excess calories ([place]) 10/12/2018  . History of tobacco abuse 10/12/2018  . Cough 10/11/2018  . Microcytic anemia 06/15/2018  . History of right mastectomy 06/15/2018  . Vitamin D deficiency 06/15/2018  . Emphysema of lung ([place]) 06/15/2018  . Urine, incontinence, stress female 06/15/2018  . Estrogen deficiency 03/30/2016  . Rhinitis, allergic   . HTN (hypertension) 09/24/2014  . Hyperlipidemia 09/24/2014  . OA (osteoarthritis) 09/24/2014  . History of solitary pulmonary nodule 03/21/2008  . History of right breast cancer 03/21/2008   Starr Lake PT, DPT, LAT, ATC  10/17/19  10:57 AM      [facility] [address] [place], [place], [address] Phone: [phone]   Fax:  [phone]  Name: [name] Cooper MRN: [id number] Date of Birth: 23-May-1950

## 2019-10-24 ENCOUNTER — Ambulatory Visit: Payer: Medicare Other | Attending: Family Medicine | Admitting: Physical Therapy

## 2019-10-24 ENCOUNTER — Other Ambulatory Visit: Payer: Self-pay

## 2019-10-24 DIAGNOSIS — G8929 Other chronic pain: Secondary | ICD-10-CM | POA: Insufficient documentation

## 2019-10-24 DIAGNOSIS — R600 Localized edema: Secondary | ICD-10-CM | POA: Insufficient documentation

## 2019-10-24 DIAGNOSIS — M6281 Muscle weakness (generalized): Secondary | ICD-10-CM | POA: Diagnosis not present

## 2019-10-24 DIAGNOSIS — M25511 Pain in right shoulder: Secondary | ICD-10-CM | POA: Diagnosis not present

## 2019-10-24 NOTE — Therapy (Signed)
[place], [place], [address] Phone: [phone]   Fax:  [phone]  Physical Therapy Treatment  Patient Details  Name: [name] Cooper MRN: [id number] Date of Birth: [birth year] Referring Provider (PT): Gregor Hams, MD    Encounter Date: 10/24/2019   PT End of Session - 10/24/19 1348    Visit Number 2    Number of Visits 13    Date for PT Re-Evaluation 11/28/19    PT Start Time 2878    PT Stop Time 1425    PT Time Calculation (min) 40 min    Activity Tolerance Patient tolerated treatment well;Patient limited by pain    Behavior During Therapy [facility] for tasks assessed/performed           Past Medical History:  Diagnosis Date   Cancer ([place])    breast and ovarian   Chicken pox    Chronic lower back pain    Diabetes ([place])    GERD (gastroesophageal reflux disease)    Hepatitis 1970's   History of recurrent UTIs    Hyperlipemia    Slow heart rate dx'd 09/24/2014   Urine incontinence     Past Surgical History:  Procedure Laterality Date   ABDOMINAL HYSTERECTOMY  1984   APPENDECTOMY     BREAST BIOPSY Right 1996   BREAST LUMPECTOMY Right 1996   LEFT HEART CATH AND CORONARY ANGIOGRAPHY N/A 01/13/2019   Procedure: LEFT HEART CATH AND CORONARY ANGIOGRAPHY;  Surgeon: Nelva Bush, MD;  Location: [place] [facility];  Service: Cardiovascular;  Laterality: N/A;   MASTECTOMY Right 1996   TONSILLECTOMY      There were no vitals filed for this visit.   Subjective Assessment - 10/24/19 1346    Subjective Pt arriving to threapy reporting 4/10 pain in her R shoulder. Pt reporting soreness in when performing her exercises.    Pertinent History R breast mastectomy 1996    Limitations Lifting    How long can you sit comfortably? unlimited    How long can you stand comfortably? unlimited    How long can you walk comfortably? unlimited    Diagnostic tests 6 /23/2021 x-ray IMPRESSION:No  acute osseous abnormality.  Degenerative changes    Patient Stated Goals make the arm stop hurting    Currently in Pain? Yes    Pain Location Shoulder    Pain Orientation Right;Anterior;Posterior;Lateral    Pain Descriptors / Indicators Aching;Sore    Pain Type Chronic pain    Pain Onset More than a month ago    Pain Frequency Constant                             [facility] Adult PT Treatment/Exercise - 10/24/19 0001      Exercises   Exercises Shoulder      Shoulder Exercises: Supine   External Rotation AAROM;Right;15 reps    Flexion AAROM;Right;15 reps    Other Supine Exercises cervical retraction x 5 holding 5 seconds each    Other Supine Exercises serratus punches x 10 using wand      Shoulder Exercises: Seated   Retraction AROM;Both;10 reps    Other Seated Exercises table slides: flexion, scaption, ER x 10 hodling 5 seconds each      Shoulder Exercises: Pulleys   Flexion 3 minutes      Shoulder Exercises: Isometric Strengthening   Flexion 5X5"    Extension 5X5"    External Rotation  5X5"    Internal Rotation 5X5"      Manual Therapy   Manual Therapy Soft tissue mobilization;Passive ROM    Manual therapy comments 10 minutes    Soft tissue mobilization Anterior/posterior shoulder, Grade 2-3 GH AP mobs    Passive ROM R shoulder, flexion/ abd/ ER                    PT Short Term Goals - 10/24/19 1403      PT SHORT TERM GOAL #1   Title pt to be I with inital HEP    Status On-going      PT SHORT TERM GOAL #2   Title pt to verbalize/ demo efficient posture and lifting mechanics to reduce and prevent R shoulder pain    Status On-going      PT SHORT TERM GOAL #3   Title increase grip strength in R hand by >/= 8# to demo improvement in shoulder function    Status On-going    Target Date 11/07/19             PT Long Term Goals - 10/17/19 1052      PT LONG TERM GOAL #1   Title increase R shoulder flexion/abduction to >/= 100 degrees   </= 2/10 pain for functional mobility required for ADLs    Time 6    Period Weeks    Status New    Target Date 11/28/19      PT LONG TERM GOAL #2   Title pt to be able to reach IR to >/= L3 for donning/ doffing bra, and ER to C3 to be able to brush hair with </= 3/10 pain    Time 6    Period Weeks    Status New    Target Date 11/28/19      PT LONG TERM GOAL #3   Title increase R shoulder strength to >/= 4-/5 in available ROM for functional strength to assist with ADLS    Baseline -    Time 6    Period Weeks    Status New    Target Date 11/28/19      PT LONG TERM GOAL #4   Title iincrease FOTO score to </= 39% limited to demo improvement in function    Time 6    Period Weeks    Status New    Target Date 11/28/19      PT LONG TERM GOAL #5   Title pt to be I with all HEP given to maintain and progress current level of function ind    Baseline -    Time 6    Period Weeks    Status New    Target Date 11/28/19                 Plan - 10/24/19 1358    Clinical Impression Statement Pt presenting today with 4/10 pain in her left shoulder. Pt tolerating all exercises well with guarding noted. Concentration on active assisted, isometrics,  and PROM during today's session. Continue skilled PT as pt tolerates progressing towrad goals set.    Personal Factors and Comorbidities Comorbidity 2    Comorbidities hx of Cx, DM,    Examination-Activity Limitations Lift    Stability/Clinical Decision Making Evolving/Moderate complexity    Rehab Potential Good    PT Frequency 2x / week    PT Treatment/Interventions ADLs/Self Care Home Management;Cryotherapy;Therapeutic activities;Therapeutic exercise;Balance training;Neuromuscular re-education;Manual techniques;Passive range of motion;Dry needling;Taping;Patient/family education;Vasopneumatic Device  PT Next Visit Plan (hx of Cx in R breast no MHP or e-stim modalities), provide pt FOTO handout, PROM - > AAROM, STW for upper trap  /levator scapulae, trial rotator cuff isometrics. ice for painPRN    PT Home Exercise Plan [id number] - upper trap stretch, levator scapuale stretch, scapular retraction, table slides flexion/ abduction    Consulted and Agree with Plan of Care Patient           Patient will benefit from skilled therapeutic intervention in order to improve the following deficits and impairments:  Improper body mechanics, Increased muscle spasms, Decreased strength, Pain, Postural dysfunction, Decreased activity tolerance, Decreased endurance, Decreased range of motion  Visit Diagnosis: Chronic right shoulder pain  Muscle weakness (generalized)  Localized edema     Problem List Patient Active Problem List   Diagnosis Date Noted   CKD (chronic kidney disease) stage 3, GFR 30-59 ml/min 10/11/2019   Heme positive stool 09/14/2019   Iron deficiency anemia 07/31/2019   Diabetes mellitus without complication ([place]) 61/44/3154   Dyspnea on exertion 01/13/2019   Coronary artery calcification 01/13/2019   Morbid (severe) obesity due to excess calories ([place]) 10/12/2018   History of tobacco abuse 10/12/2018   Cough 10/11/2018   Microcytic anemia 06/15/2018   History of right mastectomy 06/15/2018   Vitamin D deficiency 06/15/2018   Emphysema of lung ([place]) 06/15/2018   Urine, incontinence, stress female 06/15/2018   Estrogen deficiency 03/30/2016   Rhinitis, allergic    HTN (hypertension) 09/24/2014   Hyperlipidemia 09/24/2014   OA (osteoarthritis) 09/24/2014   History of solitary pulmonary nodule 03/21/2008   History of right breast cancer 03/21/2008    Oretha Caprice, PT, MPT 10/24/2019, 2:31 PM  [facility] [address] [place], [place], [address] Phone: [phone]   Fax:  [phone]  Name: [name] Cooper MRN: [id number] Date of Birth: Apr 16, 1951

## 2019-10-26 ENCOUNTER — Other Ambulatory Visit: Payer: Self-pay

## 2019-10-26 ENCOUNTER — Encounter: Payer: Self-pay | Admitting: Internal Medicine

## 2019-10-26 ENCOUNTER — Ambulatory Visit (AMBULATORY_SURGERY_CENTER): Payer: Medicare Other | Admitting: Internal Medicine

## 2019-10-26 VITALS — BP 126/60 | HR 55 | Temp 97.3°F | Resp 16 | Ht 66.0 in | Wt 291.0 lb

## 2019-10-26 DIAGNOSIS — R195 Other fecal abnormalities: Secondary | ICD-10-CM | POA: Diagnosis not present

## 2019-10-26 DIAGNOSIS — K449 Diaphragmatic hernia without obstruction or gangrene: Secondary | ICD-10-CM | POA: Diagnosis not present

## 2019-10-26 DIAGNOSIS — D125 Benign neoplasm of sigmoid colon: Secondary | ICD-10-CM | POA: Diagnosis not present

## 2019-10-26 DIAGNOSIS — D124 Benign neoplasm of descending colon: Secondary | ICD-10-CM

## 2019-10-26 DIAGNOSIS — K31819 Angiodysplasia of stomach and duodenum without bleeding: Secondary | ICD-10-CM | POA: Diagnosis not present

## 2019-10-26 DIAGNOSIS — K219 Gastro-esophageal reflux disease without esophagitis: Secondary | ICD-10-CM

## 2019-10-26 DIAGNOSIS — K635 Polyp of colon: Secondary | ICD-10-CM

## 2019-10-26 DIAGNOSIS — D509 Iron deficiency anemia, unspecified: Secondary | ICD-10-CM

## 2019-10-26 MED ORDER — SODIUM CHLORIDE 0.9 % IV SOLN: 500.0000 mL | Freq: Once | INTRAVENOUS | Status: DC

## 2019-10-26 NOTE — Progress Notes (Signed)
Called to room to assist during endoscopic procedure.  Patient ID and intended procedure confirmed with present staff. Received instructions for my participation in the procedure from the performing physician.  

## 2019-10-26 NOTE — Progress Notes (Signed)
History reviewed today  VS CW  

## 2019-10-26 NOTE — Patient Instructions (Signed)
Handouts on polyps, esophagitis, and hiatal hernia given to ou today  Await pathology results    YOU HAD AN ENDOSCOPIC PROCEDURE TODAY AT [facility]:   Refer to the procedure report that was given to you for any specific questions about what was found during the examination.  If the procedure report does not answer your questions, please call your gastroenterologist to clarify.  If you requested that your care partner not be given the details of your procedure findings, then the procedure report has been included in a sealed envelope for you to review at your convenience later.  YOU SHOULD EXPECT: Some feelings of bloating in the abdomen. Passage of more gas than usual.  Walking can help get rid of the air that was put into your GI tract during the procedure and reduce the bloating. If you had a lower endoscopy (such as a colonoscopy or flexible sigmoidoscopy) you may notice spotting of blood in your stool or on the toilet paper. If you underwent a bowel prep for your procedure, you may not have a normal bowel movement for a few days.  Please Note:  You might notice some irritation and congestion in your nose or some drainage.  This is from the oxygen used during your procedure.  There is no need for concern and it should clear up in a day or so.  SYMPTOMS TO REPORT IMMEDIATELY:   Following lower endoscopy (colonoscopy or flexible sigmoidoscopy):  Excessive amounts of blood in the stool  Significant tenderness or worsening of abdominal pains  Swelling of the abdomen that is new, acute  Fever of 100F or higher   Following upper endoscopy (EGD)  Vomiting of blood or coffee ground material  New chest pain or pain under the shoulder blades  Painful or persistently difficult swallowing  New shortness of breath  Fever of 100F or higher  Black, tarry-looking stools  For urgent or emergent issues, a gastroenterologist can be reached at any hour by calling [phone]. Do  not use MyChart messaging for urgent concerns.    DIET:  We do recommend a small meal at first, but then you may proceed to your regular diet.  Drink plenty of fluids but you should avoid alcoholic beverages for 24 hours.  ACTIVITY:  You should plan to take it easy for the rest of today and you should NOT DRIVE or use heavy machinery until tomorrow (because of the sedation medicines used during the test).    FOLLOW UP: Our staff will call the number listed on your records 48-72 hours following your procedure to check on you and address any questions or concerns that you may have regarding the information given to you following your procedure. If we do not reach you, we will leave a message.  We will attempt to reach you two times.  During this call, we will ask if you have developed any symptoms of COVID 19. If you develop any symptoms (ie: fever, flu-like symptoms, shortness of breath, cough etc.) before then, please call [phone].  If you test positive for Covid 19 in the 2 weeks post procedure, please call and report this information to [place].    If any biopsies were taken you will be contacted by phone or by letter within the next 1-3 weeks.  Please call us at [phone] if you have not heard about the biopsies in 3 weeks.    SIGNATURES/CONFIDENTIALITY: You and/or your care partner have signed paperwork which will be entered  into your electronic medical record.  These signatures attest to the fact that that the information above on your After Visit Summary has been reviewed and is understood.  Full responsibility of the confidentiality of this discharge information lies with you and/or your care-partner.

## 2019-10-26 NOTE — Op Note (Signed)
[place] Patient Name: [name] Cooper Procedure Date: 10/26/2019 3:09 PM MRN: [id number] Endoscopist: Docia Chuck. Henrene Pastor , MD Age: 69 Referring MD:  Date of Birth: 1950/06/26 Gender: Female Account #: [id number] Procedure:                Colonoscopy with cold snare polypectomy x 2 Indications:              Iron deficiency anemia. Previous colonoscopy 2017                            with non-adenomatous polyp Medicines:                Monitored Anesthesia Care Procedure:                Pre-Anesthesia Assessment:                           - Prior to the procedure, a History and Physical                            was performed, and patient medications and                            allergies were reviewed. The patient's tolerance of                            previous anesthesia was also reviewed. The risks                            and benefits of the procedure and the sedation                            options and risks were discussed with the patient.                            All questions were answered, and informed consent                            was obtained. Prior Anticoagulants: The patient has                            taken no previous anticoagulant or antiplatelet                            agents. ASA Grade Assessment: II - A patient with                            mild systemic disease. After reviewing the risks                            and benefits, the patient was deemed in                            satisfactory condition to undergo the procedure.  After obtaining informed consent, the colonoscope                            was passed under direct vision. Throughout the                            procedure, the patient's blood pressure, pulse, and                            oxygen saturations were monitored continuously. The                            Colonoscope was introduced through the anus and                             advanced to the the cecum, identified by                            appendiceal orifice and ileocecal valve. The                            ileocecal valve, appendiceal orifice, and rectum                            were photographed. The quality of the bowel                            preparation was excellent. The colonoscopy was                            performed without difficulty. The patient tolerated                            the procedure well. The bowel preparation used was                            SUPREP via split dose instruction. Scope In: 3:35:09 PM Scope Out: 3:50:26 PM Scope Withdrawal Time: 0 hours 11 minutes 1 second  Total Procedure Duration: 0 hours 15 minutes 17 seconds  Findings:                 Two polyps were found in the sigmoid colon and                            descending colon. The polyps were 3 to 5 mm in                            size. These polyps were removed with a cold snare.                            Resection and retrieval were complete.                           There was fixed rectosigmoid stenosis which  would                            not permit the passage of the standard adult                            colonoscope. Thus, the exam was completed with the                            pediatric colonoscope. The exam was otherwise                            without abnormality on direct and retroflexion                            views. Complications:            No immediate complications. Estimated blood loss:                            None. Estimated Blood Loss:     Estimated blood loss: none. Impression:               - Two 3 to 5 mm polyps in the sigmoid colon and in                            the descending colon, removed with a cold snare.                            Resected and retrieved.                           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                           - The examination was otherwise normal on direct                            and retroflexion views.                           - EGD today. Please see report Recommendation:           - Repeat colonoscopy in 10 years for surveillance                            if polyps adenomatous. Otherwise, no routine                            follow-up screening recommended.                           -  Patient has a contact number available for                            emergencies. The signs and symptoms of potential                            delayed complications were discussed with the                            patient. Return to normal activities tomorrow.                            Written discharge instructions were provided to the                            patient.                           - Resume previous diet.                           - Continue present medications.                           - Await pathology results. Docia Chuck. Henrene Pastor, MD 10/26/2019 4:06:30 PM This report has been signed electronically.

## 2019-10-26 NOTE — Progress Notes (Signed)
A/ox3, pleased with MAC, report to RN 

## 2019-10-26 NOTE — Op Note (Signed)
[place] Patient Name: [name] Cooper Procedure Date: 10/26/2019 3:09 PM MRN: [id number] Endoscopist: Docia Chuck. Henrene Pastor , MD Age: 69 Referring MD:  Date of Birth: 1950-05-09 Gender: Female Account #: [id number] Procedure:                Upper GI endoscopy Indications:              Iron deficiency anemia. Most recent hemoglobin 11.8 Medicines:                Monitored Anesthesia Care Procedure:                Pre-Anesthesia Assessment:                           - Prior to the procedure, a History and Physical                            was performed, and patient medications and                            allergies were reviewed. The patient's tolerance of                            previous anesthesia was also reviewed. The risks                            and benefits of the procedure and the sedation                            options and risks were discussed with the patient.                            All questions were answered, and informed consent                            was obtained. Prior Anticoagulants: The patient has                            taken no previous anticoagulant or antiplatelet                            agents. ASA Grade Assessment: II - A patient with                            mild systemic disease. After reviewing the risks                            and benefits, the patient was deemed in                            satisfactory condition to undergo the procedure.                           After obtaining informed consent, the endoscope was  passed under direct vision. Throughout the                            procedure, the patient's blood pressure, pulse, and                            oxygen saturations were monitored continuously. The                            Endoscope was introduced through the mouth, and                            advanced to the second part of duodenum. The upper                            GI  endoscopy was accomplished without difficulty.                            The patient tolerated the procedure well. Scope In: Scope Out: Findings:                 The esophagus revealed distal esophagitis as                            manifested by several erosions.                           The stomach revealed a sliding hiatal hernia                            without obvious erosions.                           The examined duodenum revealed several diminutive                            AVMs in the bulb and second portion.                           The cardia and gastric fundus were normal on                            retroflexion. Complications:            No immediate complications. Estimated Blood Loss:     Estimated blood loss: none. Impression:               1. Reflux esophagitis                           2. Hiatal hernia without erosions                           3. Diminutive duodenal AVMs. Potential cause for                            iron deficiency anemia. Recommendation:  1. Reflux precautions                           2. Continue medications including iron                           3. Would recommend iron therapy daily indefinitely                            to avoid recurrent anemia                           4. Return to the care of your primary provider, Dr.                            Rogers Blocker. Docia Chuck. Henrene Pastor, MD 10/26/2019 4:14:56 PM This report has been signed electronically.

## 2019-10-30 ENCOUNTER — Ambulatory Visit: Payer: Medicare Other | Admitting: Physical Therapy

## 2019-10-30 ENCOUNTER — Encounter: Payer: Self-pay | Admitting: Physical Therapy

## 2019-10-30 ENCOUNTER — Telehealth: Payer: Self-pay | Admitting: *Deleted

## 2019-10-30 ENCOUNTER — Other Ambulatory Visit: Payer: Self-pay

## 2019-10-30 DIAGNOSIS — M6281 Muscle weakness (generalized): Secondary | ICD-10-CM | POA: Diagnosis not present

## 2019-10-30 DIAGNOSIS — R600 Localized edema: Secondary | ICD-10-CM

## 2019-10-30 DIAGNOSIS — G8929 Other chronic pain: Secondary | ICD-10-CM | POA: Diagnosis not present

## 2019-10-30 DIAGNOSIS — M25511 Pain in right shoulder: Secondary | ICD-10-CM | POA: Diagnosis not present

## 2019-10-30 NOTE — Therapy (Signed)
[place], [place], [address] Phone: [phone]   Fax:  [phone]  Physical Therapy Treatment  Patient Details  Name: [name] Cooper MRN: [id number] Date of Birth: Sep 24, 1950 Referring Provider (PT): Gregor Hams, MD    Encounter Date: 10/30/2019   PT End of Session - 10/30/19 1236    Visit Number 3    Number of Visits 13    Date for PT Re-Evaluation 11/28/19    PT Start Time 1230    PT Stop Time 1315    PT Time Calculation (min) 45 min           Past Medical History:  Diagnosis Date  . Anemia   . Arthritis   . Cancer ([place])    breast and ovarian  . Chicken pox   . Chronic lower back pain   . COPD (chronic obstructive pulmonary disease) ([place])   . Diabetes ([place])   . GERD (gastroesophageal reflux disease)   . Hepatitis 1970's  . History of recurrent UTIs   . Hyperlipemia   . Hypertension   . Slow heart rate dx'd 09/24/2014  . Urine incontinence     Past Surgical History:  Procedure Laterality Date  . ABDOMINAL HYSTERECTOMY  1984  . APPENDECTOMY    . BREAST BIOPSY Right 1996  . BREAST LUMPECTOMY Right 1996  . LEFT HEART CATH AND CORONARY ANGIOGRAPHY N/A 01/13/2019   Procedure: LEFT HEART CATH AND CORONARY ANGIOGRAPHY;  Surgeon: Nelva Bush, MD;  Location: [facility];  Service: Cardiovascular;  Laterality: N/A;  . MASTECTOMY Right 1996  . TONSILLECTOMY      There were no vitals filed for this visit.   Subjective Assessment - 10/30/19 1234    Subjective Pt reports increased pain at 9/10 in neck and shoulder. She reports compliance with HEP.    Currently in Pain? Yes    Pain Score 9     Pain Location Shoulder    Pain Orientation Right    Pain Descriptors / Indicators Aching;Sore    Pain Type Chronic pain    Aggravating Factors  combing her hair    Pain Relieving Factors tylenol, avoiding use of painful activities.                             [facility]  Adult PT Treatment/Exercise - 10/30/19 0001      Shoulder Exercises: Supine   Flexion AAROM;Right;15 reps      Shoulder Exercises: Seated   Retraction AROM;Both;10 reps    Other Seated Exercises Table slides too painful today- used physioball in standing for flexion rollouts bilateral , IR/ER rolling       Shoulder Exercises: Standing   Other Standing Exercises UE ranger  for flexion- from floor highest elevation-approx 75 degrees, UE ranger ER AAROM    Other Standing Exercises Wall ladder flexion x 4       Shoulder Exercises: Pulleys   Flexion 1 minute   unable to tolerate today.      Modalities   Modalities Cryotherapy      Cryotherapy   Number Minutes Cryotherapy 7 Minutes    Cryotherapy Location Shoulder    Type of Cryotherapy Ice pack      Manual Therapy   Manual Therapy Soft tissue mobilization;Passive ROM    Manual therapy comments 10 minutes    Soft tissue mobilization Right posterior shoulde r   hypersensitive   Passive ROM  R shoulder, flexion/ abd/ ER                    PT Short Term Goals - 10/24/19 1403      PT SHORT TERM GOAL #1   Title pt to be I with inital HEP    Status On-going      PT SHORT TERM GOAL #2   Title pt to verbalize/ demo efficient posture and lifting mechanics to reduce and prevent R shoulder pain    Status On-going      PT SHORT TERM GOAL #3   Title increase grip strength in R hand by >/= 8# to demo improvement in shoulder function    Status On-going    Target Date 11/07/19             PT Long Term Goals - 10/17/19 1052      PT LONG TERM GOAL #1   Title increase R shoulder flexion/abduction to >/= 100 degrees  </= 2/10 pain for functional mobility required for ADLs    Time 6    Period Weeks    Status New    Target Date 11/28/19      PT LONG TERM GOAL #2   Title pt to be able to reach IR to >/= L3 for donning/ doffing bra, and ER to C3 to be able to brush hair with </= 3/10 pain    Time 6    Period Weeks     Status New    Target Date 11/28/19      PT LONG TERM GOAL #3   Title increase R shoulder strength to >/= 4-/5 in available ROM for functional strength to assist with ADLS    Baseline -    Time 6    Period Weeks    Status New    Target Date 11/28/19      PT LONG TERM GOAL #4   Title iincrease FOTO score to </= 39% limited to demo improvement in function    Time 6    Period Weeks    Status New    Target Date 11/28/19      PT LONG TERM GOAL #5   Title pt to be I with all HEP given to maintain and progress current level of function ind    Baseline -    Time 6    Period Weeks    Status New    Target Date 11/28/19                 Plan - 10/30/19 1307    Clinical Impression Statement Pt with increased pain and decrease tolerance to therex today. She reports compliance with HEP. She reports aggravating the shoulder with attempts at combing her hair. She had diffciulty tolerating PROM and gentle soft tissue work. Ice pack placed on posterior shoulder to decrease pain. She was encouraged to apply at home 3 x per day.    PT Next Visit Plan (hx of Cx in R breast no MHP or e-stim modalities), provide pt FOTO handout, PROM - > AAROM, STW for upper trap /levator scapulae, trial rotator cuff isometrics. ice for painPRN    PT Home Exercise Plan [id number] - upper trap stretch, levator scapuale stretch, scapular retraction, table slides flexion/ abduction           Patient will benefit from skilled therapeutic intervention in order to improve the following deficits and impairments:  Improper body mechanics, Increased muscle spasms, Decreased strength, Pain, Postural  dysfunction, Decreased activity tolerance, Decreased endurance, Decreased range of motion  Visit Diagnosis: Chronic right shoulder pain  Muscle weakness (generalized)  Localized edema     Problem List Patient Active Problem List   Diagnosis Date Noted  . CKD (chronic kidney disease) stage 3, GFR 30-59 ml/min  10/11/2019  . Heme positive stool 09/14/2019  . Iron deficiency anemia 07/31/2019  . Diabetes mellitus without complication ([place]) 59/93/5701  . Dyspnea on exertion 01/13/2019  . Coronary artery calcification 01/13/2019  . Morbid (severe) obesity due to excess calories ([place]) 10/12/2018  . History of tobacco abuse 10/12/2018  . Cough 10/11/2018  . Microcytic anemia 06/15/2018  . History of right mastectomy 06/15/2018  . Vitamin D deficiency 06/15/2018  . Emphysema of lung ([place]) 06/15/2018  . Urine, incontinence, stress female 06/15/2018  . Estrogen deficiency 03/30/2016  . Rhinitis, allergic   . HTN (hypertension) 09/24/2014  . Hyperlipidemia 09/24/2014  . OA (osteoarthritis) 09/24/2014  . History of solitary pulmonary nodule 03/21/2008  . History of right breast cancer 03/21/2008    Dorene Ar, PTA 10/30/2019, 1:14 PM  [facility] [address] [place], [place], [address] Phone: [phone]   Fax:  [phone]  Name: [name] Cooper MRN: [id number] Date of Birth: [birth year]

## 2019-10-30 NOTE — Telephone Encounter (Signed)
  Follow up Call-  Call back number 10/26/2019  Post procedure Call Back phone  # [phone]  Permission to leave phone message Yes  Some recent data might be hidden     Patient questions:  Do you have a fever, pain , or abdominal swelling? No. Pain Score  0 *  Have you tolerated food without any problems? Yes.    Have you been able to return to your normal activities? Yes.    Do you have any questions about your discharge instructions: Diet   No. Medications  No. Follow up visit  No.  Do you have questions or concerns about your Care? No.  Actions: * If pain score is 4 or above: No action needed, pain <4.  1. Have you developed a fever since your procedure? no  2.   Have you had an respiratory symptoms (SOB or cough) since your procedure? no  3.   Have you tested positive for COVID 19 since your procedure no  4.   Have you had any family members/close contacts diagnosed with the COVID 19 since your procedure?  no   If yes to any of these questions please route to Joylene John, RN and Erenest Rasher, RN

## 2019-11-01 ENCOUNTER — Encounter: Payer: Self-pay | Admitting: Physical Therapy

## 2019-11-01 ENCOUNTER — Other Ambulatory Visit: Payer: Self-pay

## 2019-11-01 ENCOUNTER — Ambulatory Visit: Payer: Medicare Other | Admitting: Physical Therapy

## 2019-11-01 DIAGNOSIS — M25511 Pain in right shoulder: Secondary | ICD-10-CM | POA: Diagnosis not present

## 2019-11-01 DIAGNOSIS — R600 Localized edema: Secondary | ICD-10-CM | POA: Diagnosis not present

## 2019-11-01 DIAGNOSIS — M6281 Muscle weakness (generalized): Secondary | ICD-10-CM | POA: Diagnosis not present

## 2019-11-01 DIAGNOSIS — G8929 Other chronic pain: Secondary | ICD-10-CM

## 2019-11-01 NOTE — Therapy (Signed)
[place], [place], [address] Phone: [phone]   Fax:  [phone]  Physical Therapy Treatment  Patient Details  Name: [name] Cooper MRN: [id number] Date of Birth: [birth year] Referring Provider (PT): Gregor Hams, MD    Encounter Date: 11/01/2019   PT End of Session - 11/01/19 1422    Visit Number 4    Number of Visits 13    Date for PT Re-Evaluation 11/28/19    PT Start Time 2229    PT Stop Time 1455    PT Time Calculation (min) 38 min    Activity Tolerance Patient tolerated treatment well;Patient limited by pain           Past Medical History:  Diagnosis Date  . Anemia   . Arthritis   . Cancer ([place])    breast and ovarian  . Chicken pox   . Chronic lower back pain   . COPD (chronic obstructive pulmonary disease) ([place])   . Diabetes ([place])   . GERD (gastroesophageal reflux disease)   . Hepatitis 1970's  . History of recurrent UTIs   . Hyperlipemia   . Hypertension   . Slow heart rate dx'd 09/24/2014  . Urine incontinence     Past Surgical History:  Procedure Laterality Date  . ABDOMINAL HYSTERECTOMY  1984  . APPENDECTOMY    . BREAST BIOPSY Right 1996  . BREAST LUMPECTOMY Right 1996  . LEFT HEART CATH AND CORONARY ANGIOGRAPHY N/A 01/13/2019   Procedure: LEFT HEART CATH AND CORONARY ANGIOGRAPHY;  Surgeon: Nelva Bush, MD;  Location: [facility];  Service: Cardiovascular;  Laterality: N/A;  . MASTECTOMY Right 1996  . TONSILLECTOMY      There were no vitals filed for this visit.   Subjective Assessment - 11/01/19 1422    Subjective " the pain is doing better today than last time, pain is only about a 3/10 in the shoulder"    Patient Stated Goals make the arm stop hurting    Currently in Pain? Yes    Pain Score 3     Pain Orientation Right    Pain Descriptors / Indicators Aching    Pain Type Chronic pain    Pain Onset More than a month ago    Pain Frequency  Intermittent    Aggravating Factors  lifting the arm more    Pain Relieving Factors tylenol, avoiding painful              [facility] PT Assessment - 11/01/19 0001      Assessment   Medical Diagnosis Chronic right shoulder pain M25.511, G89.29    Referring Provider (PT) Gregor Hams, MD       AROM   Right Shoulder Flexion 123 Degrees    Right Shoulder ABduction 90 Degrees                         [facility] Adult PT Treatment/Exercise - 11/01/19 0001      Neck Exercises: Supine   Neck Retraction 10 reps;3 secs      Shoulder Exercises: Supine   Protraction Strengthening;Both;12 reps;Weights   using dowel rod with weight   Protraction Weight (lbs) 5      Shoulder Exercises: Seated   Flexion Strengthening;Both;12 reps;Weights    Flexion Weight (lbs) 1    Flexion Limitations scaption angle    Other Seated Exercises lower trap strengthening with elbows propped on bolster 2 x 10 with red  theraband      Shoulder Exercises: Standing   Other Standing Exercises wall walks flexion/ scaption 1 x 5 ea. with controlled eccentric lowering      Shoulder Exercises: Stretch   Other Shoulder Stretches upper trap/ levator scapulae stretch 2 x 30 sec ea.       Manual Therapy   Manual therapy comments MTPR along R upper trap/ levator scapulae x 2 ea.    Soft tissue mobilization IASTM along R upper trap/ levator scapuale                  PT Education - 11/01/19 1455    Education Details reviewed HEP and updated today for scaption, wall walks flexion/ scaption. Reviewed FOTO assessment and provided handout.    Person(s) Educated Patient    Methods Explanation;Verbal cues;Handout    Comprehension Verbalized understanding;Verbal cues required            PT Short Term Goals - 10/24/19 1403      PT SHORT TERM GOAL #1   Title pt to be I with inital HEP    Status On-going      PT SHORT TERM GOAL #2   Title pt to verbalize/ demo efficient posture and lifting mechanics to  reduce and prevent R shoulder pain    Status On-going      PT SHORT TERM GOAL #3   Title increase grip strength in R hand by >/= 8# to demo improvement in shoulder function    Status On-going    Target Date 11/07/19             PT Long Term Goals - 10/17/19 1052      PT LONG TERM GOAL #1   Title increase R shoulder flexion/abduction to >/= 100 degrees  </= 2/10 pain for functional mobility required for ADLs    Time 6    Period Weeks    Status New    Target Date 11/28/19      PT LONG TERM GOAL #2   Title pt to be able to reach IR to >/= L3 for donning/ doffing bra, and ER to C3 to be able to brush hair with </= 3/10 pain    Time 6    Period Weeks    Status New    Target Date 11/28/19      PT LONG TERM GOAL #3   Title increase R shoulder strength to >/= 4-/5 in available ROM for functional strength to assist with ADLS    Baseline -    Time 6    Period Weeks    Status New    Target Date 11/28/19      PT LONG TERM GOAL #4   Title iincrease FOTO score to </= 39% limited to demo improvement in function    Time 6    Period Weeks    Status New    Target Date 11/28/19      PT LONG TERM GOAL #5   Title pt to be I with all HEP given to maintain and progress current level of function ind    Baseline -    Time 6    Period Weeks    Status New    Target Date 11/28/19                 Plan - 11/01/19 1455    Clinical Impression Statement [name] Cooper reports improvement today compard to previous session at 3/10 pain. continued working on shoulder strengthening  to promote scapulohumeral rhythm which she demonstrated improvement in flexion following session visually, but did continue to report 3/10 pain end of session.    PT Treatment/Interventions ADLs/Self Care Home Management;Cryotherapy;Therapeutic activities;Therapeutic exercise;Balance training;Neuromuscular re-education;Manual techniques;Passive range of motion;Dry needling;Taping;Patient/family  education;Vasopneumatic Device    PT Next Visit Plan (hx of Cx in R breast no MHP or e-stim modalities),   AAROM, STW for upper trap /levator scapulae, shoulder strengthening,ice for painPRN    PT Home Exercise Plan [id number] - upper trap stretch, levator scapuale stretch, scapular retraction, table slides flexion/ abduction, scaption flexion, wall walks for flexion/ scaption           Patient will benefit from skilled therapeutic intervention in order to improve the following deficits and impairments:  Improper body mechanics, Increased muscle spasms, Decreased strength, Pain, Postural dysfunction, Decreased activity tolerance, Decreased endurance, Decreased range of motion  Visit Diagnosis: Chronic right shoulder pain  Muscle weakness (generalized)  Localized edema     Problem List Patient Active Problem List   Diagnosis Date Noted  . CKD (chronic kidney disease) stage 3, GFR 30-59 ml/min 10/11/2019  . Heme positive stool 09/14/2019  . Iron deficiency anemia 07/31/2019  . Diabetes mellitus without complication ([place]) 62/86/3817  . Dyspnea on exertion 01/13/2019  . Coronary artery calcification 01/13/2019  . Morbid (severe) obesity due to excess calories ([place]) 10/12/2018  . History of tobacco abuse 10/12/2018  . Cough 10/11/2018  . Microcytic anemia 06/15/2018  . History of right mastectomy 06/15/2018  . Vitamin D deficiency 06/15/2018  . Emphysema of lung ([place]) 06/15/2018  . Urine, incontinence, stress female 06/15/2018  . Estrogen deficiency 03/30/2016  . Rhinitis, allergic   . HTN (hypertension) 09/24/2014  . Hyperlipidemia 09/24/2014  . OA (osteoarthritis) 09/24/2014  . History of solitary pulmonary nodule 03/21/2008  . History of right breast cancer 03/21/2008   [place] PT, DPT, LAT, ATC  11/01/19  3:02 PM      [facility] [address] [place], [place], [address] Phone: [phone]   Fax:   [phone]  Name: [name] Cooper MRN: [id number] Date of Birth: 11-Dec-1950

## 2019-11-03 ENCOUNTER — Encounter: Payer: Self-pay | Admitting: Internal Medicine

## 2019-11-06 ENCOUNTER — Encounter: Payer: Self-pay | Admitting: Physical Therapy

## 2019-11-06 ENCOUNTER — Ambulatory Visit: Payer: Medicare Other | Admitting: Physical Therapy

## 2019-11-06 ENCOUNTER — Other Ambulatory Visit: Payer: Self-pay

## 2019-11-06 DIAGNOSIS — M6281 Muscle weakness (generalized): Secondary | ICD-10-CM

## 2019-11-06 DIAGNOSIS — R600 Localized edema: Secondary | ICD-10-CM

## 2019-11-06 DIAGNOSIS — M25511 Pain in right shoulder: Secondary | ICD-10-CM

## 2019-11-06 DIAGNOSIS — G8929 Other chronic pain: Secondary | ICD-10-CM | POA: Diagnosis not present

## 2019-11-06 NOTE — Therapy (Signed)
[place], [place], [address] Phone: [phone]   Fax:  [phone]  Physical Therapy Treatment  Patient Details  Name: [name] Cooper MRN: [id number] Date of Birth: May 14, 1950 Referring Provider (PT): Gregor Hams, MD    Encounter Date: 11/06/2019   PT End of Session - 11/06/19 1618    Visit Number 5    Number of Visits 13    Date for PT Re-Evaluation 11/28/19    PT Start Time 8182    PT Stop Time 1625    PT Time Calculation (min) 39 min    Activity Tolerance Patient tolerated treatment well;Patient limited by pain    Behavior During Therapy [facility] for tasks assessed/performed           Past Medical History:  Diagnosis Date  . Anemia   . Arthritis   . Cancer ([place])    breast and ovarian  . Chicken pox   . Chronic lower back pain   . COPD (chronic obstructive pulmonary disease) ([place])   . Diabetes ([place])   . GERD (gastroesophageal reflux disease)   . Hepatitis 1970's  . History of recurrent UTIs   . Hyperlipemia   . Hypertension   . Slow heart rate dx'd 09/24/2014  . Urine incontinence     Past Surgical History:  Procedure Laterality Date  . ABDOMINAL HYSTERECTOMY  1984  . APPENDECTOMY    . BREAST BIOPSY Right 1996  . BREAST LUMPECTOMY Right 1996  . LEFT HEART CATH AND CORONARY ANGIOGRAPHY N/A 01/13/2019   Procedure: LEFT HEART CATH AND CORONARY ANGIOGRAPHY;  Surgeon: Nelva Bush, MD;  Location: [place] [facility];  Service: Cardiovascular;  Laterality: N/A;  . MASTECTOMY Right 1996  . TONSILLECTOMY      There were no vitals filed for this visit.   Subjective Assessment - 11/06/19 1549    Subjective " I was doing pretty good after the last session until it started to rain. My pain is still about a 3/10"    Currently in Pain? Yes    Pain Score 3     Pain Location Shoulder    Pain Orientation Right    Pain Descriptors / Indicators Aching    Pain Type Chronic pain    Pain Onset  More than a month ago    Pain Frequency Intermittent              [facility] PT Assessment - 11/06/19 0001      Assessment   Medical Diagnosis Chronic right shoulder pain M25.511, X93.71    Referring Provider (PT) Gregor Hams, MD                          [facility] Adult PT Treatment/Exercise - 11/06/19 0001      Shoulder Exercises: Standing   External Rotation 10 reps;Theraband;Strengthening;Right    Theraband Level (Shoulder External Rotation) Level 2 (Red)    Internal Rotation Strengthening;10 reps;Theraband    Theraband Level (Shoulder Internal Rotation) Level 2 (Red)    Flexion 10 reps;Strengthening;Both   scaption angle   Row Both;Strengthening;Theraband    Theraband Level (Shoulder Row) Level 2 (Red)      Shoulder Exercises: ROM/Strengthening   UBE (Upper Arm Bike) UBE L1 x 4 min    fwd/bwd x 2 min ea.     Shoulder Exercises: Stretch   Other Shoulder Stretches upper trap/ levator scapulae stretch 2 x 30 sec ea.  PT Education - 11/06/19 1617    Education Details reviewed HEP and updated today to for shoulder IR/ER and rows.    Person(s) Educated Patient    Methods Explanation;Verbal cues;Handout    Comprehension Verbalized understanding;Verbal cues required            PT Short Term Goals - 10/24/19 1403      PT SHORT TERM GOAL #1   Title pt to be I with inital HEP    Status On-going      PT SHORT TERM GOAL #2   Title pt to verbalize/ demo efficient posture and lifting mechanics to reduce and prevent R shoulder pain    Status On-going      PT SHORT TERM GOAL #3   Title increase grip strength in R hand by >/= 8# to demo improvement in shoulder function    Status On-going    Target Date 11/07/19             PT Long Term Goals - 10/17/19 1052      PT LONG TERM GOAL #1   Title increase R shoulder flexion/abduction to >/= 100 degrees  </= 2/10 pain for functional mobility required for ADLs    Time 6    Period Weeks      Status New    Target Date 11/28/19      PT LONG TERM GOAL #2   Title pt to be able to reach IR to >/= L3 for donning/ doffing bra, and ER to C3 to be able to brush hair with </= 3/10 pain    Time 6    Period Weeks    Status New    Target Date 11/28/19      PT LONG TERM GOAL #3   Title increase R shoulder strength to >/= 4-/5 in available ROM for functional strength to assist with ADLS    Baseline -    Time 6    Period Weeks    Status New    Target Date 11/28/19      PT LONG TERM GOAL #4   Title iincrease FOTO score to </= 39% limited to demo improvement in function    Time 6    Period Weeks    Status New    Target Date 11/28/19      PT LONG TERM GOAL #5   Title pt to be I with all HEP given to maintain and progress current level of function ind    Baseline -    Time 6    Period Weeks    Status New    Target Date 11/28/19                 Plan - 11/06/19 1619    Clinical Impression Statement She continues to have trigger points in the  Rupper trap/ levator scapulae that calms down with manual trigger point release techniques. focused session posterior shoulder strengthening which was given as HEP. She did noted feeling a little more sore following today's session but declined modalities.    PT Treatment/Interventions ADLs/Self Care Home Management;Cryotherapy;Therapeutic activities;Therapeutic exercise;Balance training;Neuromuscular re-education;Manual techniques;Passive range of motion;Dry needling;Taping;Patient/family education;Vasopneumatic Device    PT Next Visit Plan (hx of Cx in R breast no MHP or e-stim modalities),   AROM, STW for upper trap /levator scapulae, shoulder strengthening,ice for painPRN    PT Home Exercise Plan [id number] - upper trap stretch, levator scapuale stretch, scapular retraction, table slides flexion/ abduction, scaption flexion, wall walks for flexion/  scaption    Consulted and Agree with Plan of Care Patient           Patient will  benefit from skilled therapeutic intervention in order to improve the following deficits and impairments:  Improper body mechanics, Increased muscle spasms, Decreased strength, Pain, Postural dysfunction, Decreased activity tolerance, Decreased endurance, Decreased range of motion  Visit Diagnosis: Chronic right shoulder pain  Muscle weakness (generalized)  Localized edema     Problem List Patient Active Problem List   Diagnosis Date Noted  . CKD (chronic kidney disease) stage 3, GFR 30-59 ml/min 10/11/2019  . Heme positive stool 09/14/2019  . Iron deficiency anemia 07/31/2019  . Diabetes mellitus without complication ([place]) 16/55/3748  . Dyspnea on exertion 01/13/2019  . Coronary artery calcification 01/13/2019  . Morbid (severe) obesity due to excess calories ([place]) 10/12/2018  . History of tobacco abuse 10/12/2018  . Cough 10/11/2018  . Microcytic anemia 06/15/2018  . History of right mastectomy 06/15/2018  . Vitamin D deficiency 06/15/2018  . Emphysema of lung ([place]) 06/15/2018  . Urine, incontinence, stress female 06/15/2018  . Estrogen deficiency 03/30/2016  . Rhinitis, allergic   . HTN (hypertension) 09/24/2014  . Hyperlipidemia 09/24/2014  . OA (osteoarthritis) 09/24/2014  . History of solitary pulmonary nodule 03/21/2008  . History of right breast cancer 03/21/2008   Starr Lake PT, DPT, LAT, ATC  11/06/19  4:26 PM      [facility] [address] [place], [place], [address] Phone: [phone]   Fax:  [phone]  Name: [name] Cooper MRN: [id number] Date of Birth: 08-Jul-1950

## 2019-11-08 ENCOUNTER — Other Ambulatory Visit: Payer: Self-pay

## 2019-11-08 ENCOUNTER — Ambulatory Visit: Payer: Medicare Other | Admitting: Physical Therapy

## 2019-11-08 ENCOUNTER — Encounter: Payer: Self-pay | Admitting: Physical Therapy

## 2019-11-08 DIAGNOSIS — R600 Localized edema: Secondary | ICD-10-CM

## 2019-11-08 DIAGNOSIS — M6281 Muscle weakness (generalized): Secondary | ICD-10-CM | POA: Diagnosis not present

## 2019-11-08 DIAGNOSIS — G8929 Other chronic pain: Secondary | ICD-10-CM | POA: Diagnosis not present

## 2019-11-08 DIAGNOSIS — M25511 Pain in right shoulder: Secondary | ICD-10-CM | POA: Diagnosis not present

## 2019-11-08 NOTE — Therapy (Signed)
[place], [place], [address] Phone: [phone]   Fax:  [phone]  Physical Therapy Treatment  Patient Details  Name: [name] Cooper MRN: [id number] Date of Birth: 1951-03-20 Referring Provider (PT): Gregor Hams, MD    Encounter Date: 11/08/2019   PT End of Session - 11/08/19 1418    Visit Number 6    Number of Visits 13    Date for PT Re-Evaluation 11/28/19    PT Start Time 1330    PT Stop Time 1414    PT Time Calculation (min) 44 min    Activity Tolerance Patient tolerated treatment well;Patient limited by pain    Behavior During Therapy [facility] for tasks assessed/performed           Past Medical History:  Diagnosis Date  . Anemia   . Arthritis   . Cancer ([place])    breast and ovarian  . Chicken pox   . Chronic lower back pain   . COPD (chronic obstructive pulmonary disease) ([place])   . Diabetes ([place])   . GERD (gastroesophageal reflux disease)   . Hepatitis 1970's  . History of recurrent UTIs   . Hyperlipemia   . Hypertension   . Slow heart rate dx'd 09/24/2014  . Urine incontinence     Past Surgical History:  Procedure Laterality Date  . ABDOMINAL HYSTERECTOMY  1984  . APPENDECTOMY    . BREAST BIOPSY Right 1996  . BREAST LUMPECTOMY Right 1996  . LEFT HEART CATH AND CORONARY ANGIOGRAPHY N/A 01/13/2019   Procedure: LEFT HEART CATH AND CORONARY ANGIOGRAPHY;  Surgeon: Nelva Bush, MD;  Location: [place] [facility];  Service: Cardiovascular;  Laterality: N/A;  . MASTECTOMY Right 1996  . TONSILLECTOMY      There were no vitals filed for this visit.   Subjective Assessment - 11/08/19 1421    Subjective " I am alittle sore today from the last session but I feel like I am doing better overall."    Currently in Pain? Yes    Pain Score 1               [facility] PT Assessment - 11/08/19 0001      Assessment   Medical Diagnosis Chronic right shoulder pain M25.511, G89.29    Referring  Provider (PT) Gregor Hams, MD       Observation/Other Assessments   Focus on Therapeutic Outcomes (FOTO)  30% limited                         [place] Adult PT Treatment/Exercise - 11/08/19 0001      Shoulder Exercises: Seated   Retraction Strengthening;12 reps    Flexion Strengthening;Both;12 reps;Weights    Other Seated Exercises distal clavilce mobs using bed sheet 2 x 12 oscillations   given as HEP      Shoulder Exercises: Stretch   Other Shoulder Stretches upper trap/ levator scapulae stretch 2 x 30 sec ea.       Manual Therapy   Manual Therapy Joint mobilization    Manual therapy comments MTPR along R upper trap/ sub-clavius x 2 ea.    Joint Mobilization disal clavicle mobs grade III AP/ inferior                  PT Education - 11/08/19 1453    Education Details updated HEP for self clavicle mobs. Reviewed FOTO reassessment and discussed if she continues to do well  the next 1-2 visits will probably be our last.    Person(s) Educated Patient    Methods Explanation;Verbal cues;Handout    Comprehension Verbalized understanding;Verbal cues required            PT Short Term Goals - 10/24/19 1403      PT SHORT TERM GOAL #1   Title pt to be I with inital HEP    Status On-going      PT SHORT TERM GOAL #2   Title pt to verbalize/ demo efficient posture and lifting mechanics to reduce and prevent R shoulder pain    Status On-going      PT SHORT TERM GOAL #3   Title increase grip strength in R hand by >/= 8# to demo improvement in shoulder function    Status On-going    Target Date 11/07/19             PT Long Term Goals - 10/17/19 1052      PT LONG TERM GOAL #1   Title increase R shoulder flexion/abduction to >/= 100 degrees  </= 2/10 pain for functional mobility required for ADLs    Time 6    Period Weeks    Status New    Target Date 11/28/19      PT LONG TERM GOAL #2   Title pt to be able to reach IR to >/= L3 for donning/ doffing  bra, and ER to C3 to be able to brush hair with </= 3/10 pain    Time 6    Period Weeks    Status New    Target Date 11/28/19      PT LONG TERM GOAL #3   Title increase R shoulder strength to >/= 4-/5 in available ROM for functional strength to assist with ADLS    Baseline -    Time 6    Period Weeks    Status New    Target Date 11/28/19      PT LONG TERM GOAL #4   Title iincrease FOTO score to </= 39% limited to demo improvement in function    Time 6    Period Weeks    Status New    Target Date 11/28/19      PT LONG TERM GOAL #5   Title pt to be I with all HEP given to maintain and progress current level of function ind    Baseline -    Time 6    Period Weeks    Status New    Target Date 11/28/19                 Plan - 11/08/19 1453    Clinical Impression Statement [name] Cooper is making excellent progress increased shoulder ROM and noting limited to no pain. she demonstrated improvement in her FOTO score todat a 30% limited. continued working shoulder strengthening. worked on distal clavicle mobs to promote end range of moiton and reduce popping sensation. end of session she noted no pain and no popping"    PT Treatment/Interventions ADLs/Self Care Home Management;Cryotherapy;Therapeutic activities;Therapeutic exercise;Balance training;Neuromuscular re-education;Manual techniques;Passive range of motion;Dry needling;Taping;Patient/family education;Vasopneumatic Device    PT Next Visit Plan (hx of Cx in R breast no MHP or e-stim modalities),   AROM, STW for upper trap /levator scapulae, shoulder strengthening,ice for painPRN    PT Home Exercise Plan [id number] - upper trap stretch, levator scapuale stretch, scapular retraction, table slides flexion/ abduction, scaption flexion, wall walks for flexion/ scaption, distal clavicle mobs  Consulted and Agree with Plan of Care Patient           Patient will benefit from skilled therapeutic intervention in order to improve the  following deficits and impairments:  Improper body mechanics, Increased muscle spasms, Decreased strength, Pain, Postural dysfunction, Decreased activity tolerance, Decreased endurance, Decreased range of motion  Visit Diagnosis: Chronic right shoulder pain  Muscle weakness (generalized)  Localized edema     Problem List Patient Active Problem List   Diagnosis Date Noted  . CKD (chronic kidney disease) stage 3, GFR 30-59 ml/min 10/11/2019  . Heme positive stool 09/14/2019  . Iron deficiency anemia 07/31/2019  . Diabetes mellitus without complication ([place]) 75/08/1831  . Dyspnea on exertion 01/13/2019  . Coronary artery calcification 01/13/2019  . Morbid (severe) obesity due to excess calories ([place]) 10/12/2018  . History of tobacco abuse 10/12/2018  . Cough 10/11/2018  . Microcytic anemia 06/15/2018  . History of right mastectomy 06/15/2018  . Vitamin D deficiency 06/15/2018  . Emphysema of lung ([place]) 06/15/2018  . Urine, incontinence, stress female 06/15/2018  . Estrogen deficiency 03/30/2016  . Rhinitis, allergic   . HTN (hypertension) 09/24/2014  . Hyperlipidemia 09/24/2014  . OA (osteoarthritis) 09/24/2014  . History of solitary pulmonary nodule 03/21/2008  . History of right breast cancer 03/21/2008   Starr Lake PT, DPT, LAT, ATC  11/08/19  2:58 PM      [facility] [address] [place], [place], [address] Phone: [phone]   Fax:  [phone]  Name: [name] Cooper MRN: [id number] Date of Birth: [birth year]

## 2019-11-13 ENCOUNTER — Other Ambulatory Visit: Payer: Self-pay

## 2019-11-13 ENCOUNTER — Ambulatory Visit
Admission: RE | Admit: 2019-11-13 | Discharge: 2019-11-13 | Disposition: A | Discharge status: Home / Self Care | Payer: Medicare Other | Source: Ambulatory Visit | Attending: Family Medicine | Admitting: Family Medicine

## 2019-11-13 DIAGNOSIS — Z1231 Encounter for screening mammogram for malignant neoplasm of breast: Secondary | ICD-10-CM | POA: Diagnosis not present

## 2019-11-14 ENCOUNTER — Encounter: Payer: Self-pay | Admitting: Physical Therapy

## 2019-11-14 ENCOUNTER — Ambulatory Visit: Payer: Medicare Other | Admitting: Physical Therapy

## 2019-11-14 ENCOUNTER — Other Ambulatory Visit: Payer: Self-pay

## 2019-11-14 DIAGNOSIS — G8929 Other chronic pain: Secondary | ICD-10-CM

## 2019-11-14 DIAGNOSIS — M25511 Pain in right shoulder: Secondary | ICD-10-CM

## 2019-11-14 DIAGNOSIS — R600 Localized edema: Secondary | ICD-10-CM

## 2019-11-14 DIAGNOSIS — M6281 Muscle weakness (generalized): Secondary | ICD-10-CM

## 2019-11-14 NOTE — Therapy (Signed)
[place], [place], [address] Phone: [phone]   Fax:  [phone]  Physical Therapy Treatment / Discharge  Patient Details  Name: [name] Cooper MRN: [id number] Date of Birth: Sep 25, 1950 Referring Provider (PT): Gregor Hams, MD    Encounter Date: 11/14/2019   PT End of Session - 11/14/19 1548    Visit Number 7    Number of Visits 13    Date for PT Re-Evaluation 11/28/19    PT Start Time 4193    PT Stop Time 1610    PT Time Calculation (min) 25 min    Activity Tolerance Patient tolerated treatment well;Patient limited by pain    Behavior During Therapy [facility] for tasks assessed/performed           Past Medical History:  Diagnosis Date  . Anemia   . Arthritis   . Cancer ([place])    breast and ovarian  . Chicken pox   . Chronic lower back pain   . COPD (chronic obstructive pulmonary disease) ([place])   . Diabetes ([place])   . GERD (gastroesophageal reflux disease)   . Hepatitis 1970's  . History of recurrent UTIs   . Hyperlipemia   . Hypertension   . Slow heart rate dx'd 09/24/2014  . Urine incontinence     Past Surgical History:  Procedure Laterality Date  . ABDOMINAL HYSTERECTOMY  1984  . APPENDECTOMY    . BREAST BIOPSY Right 1996  . BREAST LUMPECTOMY Right 1996  . LEFT HEART CATH AND CORONARY ANGIOGRAPHY N/A 01/13/2019   Procedure: LEFT HEART CATH AND CORONARY ANGIOGRAPHY;  Surgeon: Nelva Bush, MD;  Location: [place] [facility];  Service: Cardiovascular;  Laterality: N/A;  . MASTECTOMY Right 1996  . TONSILLECTOMY      There were no vitals filed for this visit.   Subjective Assessment - 11/14/19 1546    Subjective "no issues since the last session."    Currently in Pain? No/denies    Pain Score 0-No pain    Pain Onset More than a month ago    Aggravating Factors  N/A              [facility] PT Assessment - 11/14/19 0001      Assessment   Medical Diagnosis Chronic right shoulder  pain M25.511, G89.29    Referring Provider (PT) Gregor Hams, MD       Observation/Other Assessments   Focus on Therapeutic Outcomes (FOTO)  8% limited      AROM   Right Shoulder Extension 60 Degrees    Right Shoulder Flexion 140 Degrees    Right Shoulder ABduction 115 Degrees    Right Shoulder Internal Rotation --   t11   Right Shoulder External Rotation --   C7     Strength   Right Shoulder Flexion 4-/5    Right Shoulder Extension 5/5    Right Shoulder ABduction 4-/5    Right Shoulder Internal Rotation 4+/5    Right Shoulder External Rotation 4+/5    Right Hand Grip (lbs) 61.3   65,62,57                        [facility] Adult PT Treatment/Exercise - 11/14/19 0001      Shoulder Exercises: Standing   External Rotation 20 reps;Theraband    Theraband Level (Shoulder External Rotation) Level 4 (Blue)    Internal Rotation Strengthening;20 reps;Theraband    Theraband Level (Shoulder Internal Rotation) Level  4 (Blue)    Flexion Strengthening;10 reps    Row Strengthening;Both;20 reps;Theraband    Theraband Level (Shoulder Row) Level 4 (Blue)                  PT Education - 11/14/19 1614    Education Details reviewed HEP and upgraded resistance, reviewed benefits with continued strengthening with increased reps/ sets and resistance. reivewed final assessment and FOTO findings.    Person(s) Educated Patient    Methods Explanation;Verbal cues;Handout    Comprehension Verbalized understanding;Verbal cues required            PT Short Term Goals - 11/14/19 1558      PT SHORT TERM GOAL #1   Title pt to be I with inital HEP    Period Weeks    Status Achieved      PT SHORT TERM GOAL #2   Title pt to verbalize/ demo efficient posture and lifting mechanics to reduce and prevent R shoulder pain    Period Weeks    Status Achieved      PT SHORT TERM GOAL #3   Title increase grip strength in R hand by >/= 8# to demo improvement in shoulder function    Period  Weeks    Status Achieved             PT Long Term Goals - 11/14/19 1601      PT LONG TERM GOAL #1   Title increase R shoulder flexion/abduction to >/= 100 degrees  </= 2/10 pain for functional mobility required for ADLs    Period Weeks    Status Achieved      PT LONG TERM GOAL #2   Title pt to be able to reach IR to >/= L3 for donning/ doffing bra, and ER to C3 to be able to brush hair with </= 3/10 pain    Period Weeks    Status Achieved      PT LONG TERM GOAL #3   Title increase R shoulder strength to >/= 4-/5 in available ROM for functional strength to assist with ADLS    Period Weeks    Status Achieved      PT LONG TERM GOAL #4   Title iincrease FOTO score to </= 39% limited to demo improvement in function    Period Weeks    Status Achieved      PT LONG TERM GOAL #5   Title pt to be I with all HEP given to maintain and progress current level of function ind                 Plan - 11/14/19 1613    Clinical Impression Statement [name] Cooper has made excellent progress with physical therapy increasing ROM, strength and additionaly reports no pain. She increased her FOTO score to 8% limited, and met all goals today. she was able to perform all exercises today with no report of pain or limitations. she is able to maintain and progress her current HEP independenlty and will be dishcarged today.    PT Treatment/Interventions ADLs/Self Care Home Management;Cryotherapy;Therapeutic activities;Therapeutic exercise;Balance training;Neuromuscular re-education;Manual techniques;Passive range of motion;Dry needling;Taping;Patient/family education;Vasopneumatic Device    PT Next Visit Plan DC    PT Home Exercise Plan [id number] - upper trap stretch, levator scapuale stretch, scapular retraction,  scaption flexion, wall walks for flexion/ scaption, distal clavicle mobs    Consulted and Agree with Plan of Care Patient           Patient  will benefit from skilled therapeutic  intervention in order to improve the following deficits and impairments:  Improper body mechanics, Increased muscle spasms, Decreased strength, Pain, Postural dysfunction, Decreased activity tolerance, Decreased endurance, Decreased range of motion  Visit Diagnosis: Chronic right shoulder pain  Muscle weakness (generalized)  Localized edema     Problem List Patient Active Problem List   Diagnosis Date Noted  . CKD (chronic kidney disease) stage 3, GFR 30-59 ml/min 10/11/2019  . Heme positive stool 09/14/2019  . Iron deficiency anemia 07/31/2019  . Diabetes mellitus without complication ([place]) 37/94/4461  . Dyspnea on exertion 01/13/2019  . Coronary artery calcification 01/13/2019  . Morbid (severe) obesity due to excess calories ([place]) 10/12/2018  . History of tobacco abuse 10/12/2018  . Cough 10/11/2018  . Microcytic anemia 06/15/2018  . History of right mastectomy 06/15/2018  . Vitamin D deficiency 06/15/2018  . Emphysema of lung ([place]) 06/15/2018  . Urine, incontinence, stress female 06/15/2018  . Estrogen deficiency 03/30/2016  . Rhinitis, allergic   . HTN (hypertension) 09/24/2014  . Hyperlipidemia 09/24/2014  . OA (osteoarthritis) 09/24/2014  . History of solitary pulmonary nodule 03/21/2008  . History of right breast cancer 03/21/2008    Starr Lake 11/14/2019, 4:17 PM  [facility] [address] [place], [place], [address] Phone: [phone]   Fax:  [phone]  Name: [name] Cooper MRN: [id number] Date of Birth: [birth year]     PHYSICAL THERAPY DISCHARGE SUMMARY  Visits from Start of Care: 7  Current functional level related to goals / functional outcomes: See goals, FOTO 8% limited   Remaining deficits: See assessment   Education / Equipment: HEP, theraband, posture, lifting mechanics  Plan: Patient agrees to discharge.  Patient goals were met. Patient is being discharged due to meeting the  stated rehab goals.  ?????         Brigitta Pricer PT, DPT, LAT, ATC  11/14/19  4:17 PM

## 2019-11-16 ENCOUNTER — Ambulatory Visit: Payer: Medicare Other | Admitting: Physical Therapy

## 2019-11-22 ENCOUNTER — Ambulatory Visit
Admission: RE | Admit: 2019-11-22 | Discharge: 2019-11-22 | Disposition: A | Discharge status: Home / Self Care | Payer: Medicare Other | Source: Ambulatory Visit | Attending: Acute Care | Admitting: Acute Care

## 2019-11-22 ENCOUNTER — Other Ambulatory Visit: Payer: Self-pay | Admitting: Family Medicine

## 2019-11-22 DIAGNOSIS — F17210 Nicotine dependence, cigarettes, uncomplicated: Secondary | ICD-10-CM

## 2019-11-22 DIAGNOSIS — Z122 Encounter for screening for malignant neoplasm of respiratory organs: Secondary | ICD-10-CM

## 2019-11-22 DIAGNOSIS — Z87891 Personal history of nicotine dependence: Secondary | ICD-10-CM | POA: Diagnosis not present

## 2019-11-22 DIAGNOSIS — E7800 Pure hypercholesterolemia, unspecified: Secondary | ICD-10-CM

## 2019-11-25 ENCOUNTER — Other Ambulatory Visit: Payer: Self-pay | Admitting: Family Medicine

## 2019-11-25 DIAGNOSIS — E7800 Pure hypercholesterolemia, unspecified: Secondary | ICD-10-CM

## 2019-11-27 NOTE — Progress Notes (Signed)

## 2019-11-28 ENCOUNTER — Telehealth: Payer: Self-pay | Admitting: Acute Care

## 2019-11-28 DIAGNOSIS — Z87891 Personal history of nicotine dependence: Secondary | ICD-10-CM

## 2019-11-28 NOTE — Telephone Encounter (Signed)
Pt informed of CT results per Sarah Groce, NP.  PT verbalized understanding.  Copy sent to PCP.  Order placed for 1 yr f/u CT.  

## 2019-12-01 ENCOUNTER — Other Ambulatory Visit: Payer: Self-pay | Admitting: Family Medicine

## 2019-12-01 DIAGNOSIS — E7800 Pure hypercholesterolemia, unspecified: Secondary | ICD-10-CM

## 2019-12-12 ENCOUNTER — Other Ambulatory Visit: Payer: Self-pay | Admitting: Family Medicine

## 2019-12-12 DIAGNOSIS — E7800 Pure hypercholesterolemia, unspecified: Secondary | ICD-10-CM

## 2019-12-13 ENCOUNTER — Telehealth: Payer: Self-pay | Admitting: Family Medicine

## 2019-12-13 ENCOUNTER — Other Ambulatory Visit: Payer: Self-pay | Admitting: Family Medicine

## 2019-12-13 DIAGNOSIS — E7800 Pure hypercholesterolemia, unspecified: Secondary | ICD-10-CM

## 2019-12-13 MED ORDER — ROSUVASTATIN CALCIUM 20 MG PO TABS: 20.0000 mg | ORAL_TABLET | Freq: Every day | ORAL | 3 refills | Status: DC

## 2019-12-13 MED ORDER — TRIAMTERENE-HCTZ 37.5-25 MG PO TABS: 1.0000 | ORAL_TABLET | Freq: Every day | ORAL | 1 refills | Status: DC

## 2019-12-13 MED ORDER — HYDRALAZINE HCL 25 MG PO TABS: 25.0000 mg | ORAL_TABLET | Freq: Three times a day (TID) | ORAL | 2 refills | Status: DC

## 2019-12-13 NOTE — Addendum Note (Signed)
Addended by: [name] on: 12/13/2019 03:51 PM   Modules accepted: Orders

## 2019-12-13 NOTE — Telephone Encounter (Signed)
..   LAST APPOINTMENT DATE: 12/12/2019   NEXT APPOINTMENT DATE:@Visit  date not found  MEDICATION:triamterene-hydrochlorothiazide (MAXZIDE-25) 37.5-25 MG tablet  rosuvastatin (CRESTOR) 20 MG tablet(Expired)  hydrALAZINE (APRESOLINE) 25 MG tablet  [place] ([place]), [place] - [address]  **Let patient know to contact pharmacy at the end of the day to make sure medication is ready. **  ** Please notify patient to allow 48-72 hours to process**  **Encourage patient to contact the pharmacy for refills or they can request refills through [facility]**  CLINICAL FILLS OUT ALL BELOW:   LAST REFILL:  QTY:  REFILL DATE:    OTHER COMMENTS:    Okay for refill?  Please advise

## 2019-12-17 NOTE — Progress Notes (Signed)
[facility] OFFICE PROGRESS NOTE  [name], MD [place] [address]  DIAGNOSIS: Iron deficiency anemia  PRIOR THERAPY: None  CURRENT THERAPY: 1) IV iron PRN with venofer. Last given on 09/01/19 2) Oral iron supplement with integra plus p.o. daily.  INTERVAL HISTORY: [name] Cooper 69 y.o. female returns to the clinic today for a follow-up visit.  The patient was initially referred to the clinic for evaluation of microcytic anemia.  The patient had stool cards performed to assess for GI blood loss.  The patient's stool cards came back positive for blood. She saw her gastroenterologist, Dr. Henrene Pastor, who performed a upper endoscopy and colonoscopy on 10/26/19. 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   Her last IV iron infusion was on 09/01/19. She is also compliant with her oral iron supplements. She does note that they cause constipation. She reports her fatigue as being "ok". She denies any  chest pain, palpitations, or lightheadedness.She reports her baseline dyspnea on exertion due to her COPD. She continues to smoke cigarettes. She denies any abnormal bleeding or bruising including epistaxis, gingival bleeding,hemoptysis, melena, hematemesis, hematochezia, or hematuria.  She is here today for evaluation and a repeat CBC, iron studies, and ferritin.   MEDICAL HISTORY: Past Medical History:  Diagnosis Date  . Anemia   . Arthritis   . Cancer ([place])    breast and ovarian  . Chicken pox   . Chronic lower back pain   . COPD (chronic obstructive pulmonary disease) ([place])   . Diabetes ([place])   . GERD (gastroesophageal reflux disease)   . Hepatitis 1970's  . History of recurrent UTIs   . Hyperlipemia   . Hypertension   . Slow heart rate dx'd 09/24/2014  . Urine incontinence     ALLERGIES:  is allergic to lisinopril and  codeine.  MEDICATIONS:  Current Outpatient Medications  Medication Sig Dispense Refill  . acetaminophen (TYLENOL) 650 MG CR tablet Take 1,300 mg by mouth every 8 (eight) hours as needed for pain. Reported on 09/23/2015    . albuterol (PROVENTIL HFA;VENTOLIN HFA) 108 (90 Base) MCG/ACT inhaler Inhale 2 puffs into the lungs every 6 (six) hours as needed for wheezing or shortness of breath. 1 Inhaler 3  . aspirin EC 81 MG tablet Take 1 tablet (81 mg total) by mouth daily. 90 tablet 3  . cefUROXime (CEFTIN) 500 MG tablet Take 1 tablet (500 mg total) by mouth 2 (two) times daily with a meal. (Patient not taking: Reported on 10/26/2019) 14 tablet 0  . FeFum-FePoly-FA-B Cmp-C-Biot (INTEGRA PLUS) CAPS Take 1 capsule by mouth every morning. 30 capsule 2  . hydrALAZINE (APRESOLINE) 25 MG tablet Take 1 tablet (25 mg total) by mouth 3 (three) times daily. 270 tablet 2  . isosorbide mononitrate (IMDUR) 30 MG 24 hr tablet Take 1 tablet (30 mg total) by mouth daily. 30 tablet 11  . linagliptin (TRADJENTA) 5 MG TABS tablet Take 1 tablet (5 mg total) by mouth daily. 90 tablet 3  . Multiple Vitamin (MULTIVITAMIN WITH MINERALS) TABS tablet Take 1 tablet by mouth daily.    . nitroGLYCERIN (NITROSTAT) 0.4 MG SL tablet Place 1 tablet (0.4 mg total) under the tongue every 5 (five) minutes as needed for chest pain. (Patient not taking: Reported on 10/26/2019) 25 tablet prn  . rosuvastatin (CRESTOR) 20 MG tablet Take 1 tablet (20  mg total) by mouth daily. 90 tablet 3  . triamterene-hydrochlorothiazide (MAXZIDE-25) 37.5-25 MG tablet Take 1 tablet by mouth daily. 90 tablet 1  . Vitamin D, Ergocalciferol, (DRISDOL) 1.25 MG (50000 UNIT) CAPS capsule One capsule by mouth once a week for 12 weeks. Then take 2000IU/day 12 capsule 0   No current facility-administered medications for this visit.    SURGICAL HISTORY:  Past Surgical History:  Procedure Laterality Date  . ABDOMINAL HYSTERECTOMY  1984  . APPENDECTOMY    . BREAST  BIOPSY Right 1996  . BREAST LUMPECTOMY Right 1996  . LEFT HEART CATH AND CORONARY ANGIOGRAPHY N/A 01/13/2019   Procedure: LEFT HEART CATH AND CORONARY ANGIOGRAPHY;  Surgeon: Nelva Bush, MD;  Location: [facility];  Service: Cardiovascular;  Laterality: N/A;  . MASTECTOMY Right 1996  . TONSILLECTOMY      REVIEW OF SYSTEMS:   Constitutional: Negative for appetite change, chills, fatigue, fever and unexpected weight change.  HENT: Negative for mouth sores, nosebleeds, sore throat and trouble swallowing.  Eyes: Negative for eye problems and icterus.  Respiratory:Positive for baseline dyspnea on exertion due to COPD.Negative for cough, hemoptysis, and wheezing.  Cardiovascular: Negative for chest pain and leg swelling.  Gastrointestinal:Negative for abdominal pain, constipation, melena, hematochezia, diarrhea, nausea and vomiting.  Genitourinary: Negative for bladder incontinence, difficulty urinating, dysuria, frequency and hematuria.  Musculoskeletal: Negative for back pain, gait problem, neck pain and neck stiffness.  Skin: Negative for itching and rash.  Neurological: Negative for dizziness, extremity weakness, gait problem, headaches, light-headedness and seizures.  Hematological: Negative for adenopathy. Does not bruise/bleed easily.  Psychiatric/Behavioral: Negative for confusion, depression and sleep disturbance. The patient is not nervous/anxious.   PHYSICAL EXAMINATION:  Blood pressure (!) 127/95, pulse 62, temperature (!) 97.5 F (36.4 C), temperature source Tympanic, resp. rate 18, height 5\' 6"  (1.676 m), weight 288 lb 6.4 oz (130.8 kg), SpO2 100 %.  ECOG PERFORMANCE STATUS: 1 - Symptomatic but completely ambulatory  Physical Exam  Constitutional: Oriented to person, place, and time and well-developed, well-nourished, and in no distress. No distress.  HENT:  Head: Normocephalic and atraumatic.  Mouth/Throat: Oropharynx is clear and moist. No oropharyngeal  exudate.  Eyes: Conjunctivae are normal. Right eye exhibits no discharge. Left eye exhibits no discharge. No scleral icterus.  Neck: Normal range of motion. Neck supple.  Cardiovascular: Normal rate, regular rhythm, normal heart sounds and intact distal pulses.   Pulmonary/Chest: Effort normal. Quiet breath sounds in all lung fields. No respiratory distress. No wheezes. No rales.  Abdominal: Soft. Bowel sounds are normal. Exhibits no distension and no mass. There is no tenderness.  Musculoskeletal: Normal range of motion. Exhibits no edema.  Lymphadenopathy:    No cervical adenopathy.  Neurological: Alert and oriented to person, place, and time. Exhibits normal muscle tone. Gait normal. Coordination normal.  Skin: Skin is warm and dry. No rash noted. Not diaphoretic. No erythema. No pallor.  Psychiatric: Mood, memory and judgment normal.  Vitals reviewed.  LABORATORY DATA: Lab Results  Component Value Date   WBC 10.9 (H) 12/21/2019   HGB 12.4 12/21/2019   HCT 39.5 12/21/2019   MCV 72.3 (L) 12/21/2019   PLT 283 12/21/2019      Chemistry      Component Value Date/Time   NA 140 12/21/2019 1336   NA 140 01/30/2019 1330   K 3.4 (L) 12/21/2019 1336   CL 101 12/21/2019 1336   CO2 31 12/21/2019 1336   BUN 15 12/21/2019 1336   BUN  18 01/30/2019 1330   CREATININE 1.20 (H) 12/21/2019 1336   CREATININE 1.28 (H) 09/21/2016 1115      Component Value Date/Time   CALCIUM PENDING 12/21/2019 1336   ALKPHOS 98 12/21/2019 1336   AST 12 (L) 12/21/2019 1336   ALT 8 12/21/2019 1336   BILITOT 0.4 12/21/2019 1336       RADIOGRAPHIC STUDIES:  CT CHEST LUNG CA SCREEN LOW DOSE W/O CM  Result Date: 11/22/2019 CLINICAL DATA:  69 year old female former smoker, quit 1 year ago, with 30 pack-year history of smoking, for follow-up lung cancer screening. EXAM: CT CHEST WITHOUT CONTRAST LOW-DOSE FOR LUNG CANCER SCREENING TECHNIQUE: Multidetector CT imaging of the chest was performed following the  standard protocol without IV contrast. COMPARISON:  Low-dose lung cancer screening CT chest dated 11/07/2018 FINDINGS: Cardiovascular: The heart is normal in size. No pericardial effusion. No evidence of thoracic aortic aneurysm. Three vessel coronary atherosclerosis. Mediastinum/Nodes: No suspicious mediastinal lymphadenopathy. Visualized thyroid is unremarkable. Lungs/Pleura: Evaluation of the lung parenchyma is mildly constrained by respiratory motion. Mild centrilobular emphysematous changes, upper lung predominant. No focal consolidation. Mild linear scarring/atelectasis in the lingula. 2.5 mm subpleural nodule in the lateral left upper lobe. No pleural effusion or pneumothorax. Upper Abdomen: Visualized upper abdomen is notable for vascular calcifications and 2.4 cm lateral right upper pole renal cyst (series 2/image 56). Musculoskeletal: Mild degenerative changes of the visualized thoracolumbar spine. IMPRESSION: Lung-RADS 2, benign appearance or behavior. Continue annual screening with low-dose chest CT without contrast in 12 months. Aortic Atherosclerosis (ICD10-I70.0) and Emphysema (ICD10-J43.9). Electronically Signed   By: Julian Hy M.D.   On: 11/22/2019 15:38     ASSESSMENT/PLAN:  This is a very pleasant 69 year old African-American female referred to the clinic for evaluation of iron deficiency anemia.  She has been taking Integra +1 capsule p.o. daily.  She also receives IV iron with Venofer weekly x4 as needed. Last dose on 09/01/19   The patient had a repeat CBC, ferritin, and iron studies performed.  The patient's CBC notes improvement in her hbg to 12.4 today. Her iron studies are pending.   I will call her once I have the results of her iron studies and ferritin back. If it continues to be significantly low, we may arrange for IV iron infusions. Otherwise, I will arrange for her next follow up and repeat CBC, ferritin, and iron studies once I have her results from today.    She will continue to take the oral iron supplement p.o. daily for now. Discussed she may take a stool softener to help with constiaption.    She had a colonoscopy and endoscopy performed by Dr. Henrene Pastor on 10/26/19 which showed distal esophagitis as evidenced by several erosions and several diminutive AVMs in the duodenum   The patient was advised to call immediately if she has any concerning symptoms in the interval. The patient voices understanding of current disease status and treatment options and is in agreement with the current care plan. All questions were answered. The patient knows to call the clinic with any problems, questions or concerns. We can certainly see the patient much sooner if necessary    No orders of the defined types were placed in this encounter.    Hayes Rehfeldt L [facility], PA-C 12/21/19

## 2019-12-21 ENCOUNTER — Other Ambulatory Visit: Payer: Self-pay

## 2019-12-21 ENCOUNTER — Inpatient Hospital Stay: Payer: Medicare Other | Attending: Physician Assistant | Admitting: Physician Assistant

## 2019-12-21 ENCOUNTER — Inpatient Hospital Stay: Payer: Medicare Other

## 2019-12-21 VITALS — BP 127/95 | HR 62 | Temp 97.5°F | Resp 18 | Ht 66.0 in | Wt 288.4 lb

## 2019-12-21 DIAGNOSIS — D509 Iron deficiency anemia, unspecified: Secondary | ICD-10-CM

## 2019-12-21 DIAGNOSIS — D508 Other iron deficiency anemias: Secondary | ICD-10-CM | POA: Diagnosis not present

## 2019-12-21 LAB — CMP (CANCER CENTER ONLY)
ALT: 8 U/L (ref 0–44)
AST: 12 U/L — ABNORMAL LOW (ref 15–41)
Albumin: 3.5 g/dL (ref 3.5–5.0)
Alkaline Phosphatase: 98 U/L (ref 38–126)
Anion gap: 8 (ref 5–15)
BUN: 15 mg/dL (ref 8–23)
CO2: 31 mmol/L (ref 22–32)
Calcium: 9.4 mg/dL (ref 8.9–10.3)
Chloride: 101 mmol/L (ref 98–111)
Creatinine: 1.2 mg/dL — ABNORMAL HIGH (ref 0.44–1.00)
GFR, Est AFR Am: 53 mL/min — ABNORMAL LOW (ref >60)
GFR, Estimated: 46 mL/min — ABNORMAL LOW (ref >60)
Glucose, Bld: 113 mg/dL — ABNORMAL HIGH (ref 70–99)
Potassium: 3.4 mmol/L — ABNORMAL LOW (ref 3.5–5.1)
Sodium: 140 mmol/L (ref 135–145)
Total Bilirubin: 0.4 mg/dL (ref 0.3–1.2)
Total Protein: 7.6 g/dL (ref 6.5–8.1)

## 2019-12-21 LAB — CBC WITH DIFFERENTIAL (CANCER CENTER ONLY)
Abs Immature Granulocytes: 0.03 10*3/uL (ref 0.00–0.07)
Basophils Absolute: 0.1 10*3/uL (ref 0.0–0.1)
Basophils Relative: 1 %
Eosinophils Absolute: 0.6 10*3/uL — ABNORMAL HIGH (ref 0.0–0.5)
Eosinophils Relative: 5 %
HCT: 39.5 % (ref 36.0–46.0)
Hemoglobin: 12.4 g/dL (ref 12.0–15.0)
Immature Granulocytes: 0 %
Lymphocytes Relative: 36 %
Lymphs Abs: 4 10*3/uL (ref 0.7–4.0)
MCH: 22.7 pg — ABNORMAL LOW (ref 26.0–34.0)
MCHC: 31.4 g/dL (ref 30.0–36.0)
MCV: 72.3 fL — ABNORMAL LOW (ref 80.0–100.0)
Monocytes Absolute: 0.9 10*3/uL (ref 0.1–1.0)
Monocytes Relative: 8 %
Neutro Abs: 5.4 10*3/uL (ref 1.7–7.7)
Neutrophils Relative %: 50 %
Platelet Count: 283 10*3/uL (ref 150–400)
RBC: 5.46 MIL/uL — ABNORMAL HIGH (ref 3.87–5.11)
RDW: 16.6 % — ABNORMAL HIGH (ref 11.5–15.5)
WBC Count: 10.9 10*3/uL — ABNORMAL HIGH (ref 4.0–10.5)
nRBC: 0 % (ref 0.0–0.2)

## 2019-12-22 ENCOUNTER — Telehealth: Payer: Self-pay | Admitting: Physician Assistant

## 2019-12-22 ENCOUNTER — Other Ambulatory Visit: Payer: Self-pay | Admitting: Physician Assistant

## 2019-12-22 LAB — IRON AND TIBC
Iron: 28 ug/dL — ABNORMAL LOW (ref 41–142)
Saturation Ratios: 10 % — ABNORMAL LOW (ref 21–57)
TIBC: 294 ug/dL (ref 236–444)
UIBC: 266 ug/dL (ref 120–384)

## 2019-12-22 LAB — FERRITIN: Ferritin: 27 ng/mL (ref 11–307)

## 2019-12-22 NOTE — Telephone Encounter (Signed)
Scheduled appts per 9/3 sch msg. Called and spoke with patient. Confirmed appts

## 2019-12-22 NOTE — Telephone Encounter (Signed)
I called the patient and left a voicemail that her iron is low and that I was going to reach out to the scheduling team to make appointments for venofer weekly x4 for IV iron. Encouraged her to call us back if she had any questions. Scheduling message sent.

## 2020-01-01 ENCOUNTER — Inpatient Hospital Stay: Payer: Medicare Other

## 2020-01-01 ENCOUNTER — Other Ambulatory Visit: Payer: Self-pay

## 2020-01-01 VITALS — BP 135/65 | HR 59 | Temp 98.3°F | Resp 18

## 2020-01-01 DIAGNOSIS — D509 Iron deficiency anemia, unspecified: Secondary | ICD-10-CM | POA: Diagnosis not present

## 2020-01-01 MED ORDER — DIPHENHYDRAMINE HCL 25 MG PO CAPS
50.0000 mg | ORAL_CAPSULE | Freq: Once | ORAL | Status: AC
  Administered 2020-01-01: 50 mg via ORAL

## 2020-01-01 MED ORDER — ACETAMINOPHEN 325 MG PO TABS
ORAL_TABLET | ORAL | Status: AC
  Filled 2020-01-01: qty 2

## 2020-01-01 MED ORDER — DIPHENHYDRAMINE HCL 25 MG PO CAPS
ORAL_CAPSULE | ORAL | Status: AC
  Filled 2020-01-01: qty 2

## 2020-01-01 MED ORDER — ACETAMINOPHEN 325 MG PO TABS
650.0000 mg | ORAL_TABLET | Freq: Once | ORAL | Status: AC
  Administered 2020-01-01: 650 mg via ORAL

## 2020-01-01 MED ORDER — SODIUM CHLORIDE 0.9 % IV SOLN
Freq: Once | INTRAVENOUS | Status: AC
  Filled 2020-01-01: qty 250

## 2020-01-01 MED ORDER — SODIUM CHLORIDE 0.9 % IV SOLN
200.0000 mg | Freq: Once | INTRAVENOUS | Status: AC
  Administered 2020-01-01: 200 mg via INTRAVENOUS
  Filled 2020-01-01: qty 200

## 2020-01-01 NOTE — Patient Instructions (Signed)

## 2020-01-03 ENCOUNTER — Ambulatory Visit (INDEPENDENT_AMBULATORY_CARE_PROVIDER_SITE_OTHER): Payer: Medicare Other | Admitting: Podiatry

## 2020-01-03 ENCOUNTER — Other Ambulatory Visit: Payer: Self-pay

## 2020-01-03 ENCOUNTER — Encounter: Payer: Self-pay | Admitting: Podiatry

## 2020-01-03 DIAGNOSIS — M79675 Pain in left toe(s): Secondary | ICD-10-CM

## 2020-01-03 DIAGNOSIS — B351 Tinea unguium: Secondary | ICD-10-CM | POA: Diagnosis not present

## 2020-01-03 DIAGNOSIS — E119 Type 2 diabetes mellitus without complications: Secondary | ICD-10-CM

## 2020-01-03 DIAGNOSIS — M79674 Pain in right toe(s): Secondary | ICD-10-CM | POA: Diagnosis not present

## 2020-01-03 NOTE — Progress Notes (Signed)
Subjective: [name] Cooper is a pleasant 69 y.o. female patient seen today preventative diabetic foot care and painful mycotic nails b/l that are difficult to trim. Pain interferes with ambulation. Aggravating factors include wearing enclosed shoe gear. Pain is relieved with periodic professional debridement.   She voices no new pedal concerns on today's visit.  Patient Active Problem List   Diagnosis Date Noted  . CKD (chronic kidney disease) stage 3, GFR 30-59 ml/min 10/11/2019  . Heme positive stool 09/14/2019  . Iron deficiency anemia 07/31/2019  . Diabetes mellitus without complication ([place]) 70/35/0093  . Dyspnea on exertion 01/13/2019  . Coronary artery calcification 01/13/2019  . Morbid (severe) obesity due to excess calories ([place]) 10/12/2018  . History of tobacco abuse 10/12/2018  . Cough 10/11/2018  . Microcytic anemia 06/15/2018  . History of right mastectomy 06/15/2018  . Vitamin D deficiency 06/15/2018  . Emphysema of lung ([place]) 06/15/2018  . Urine, incontinence, stress female 06/15/2018  . Estrogen deficiency 03/30/2016  . Rhinitis, allergic   . HTN (hypertension) 09/24/2014  . Hyperlipidemia 09/24/2014  . OA (osteoarthritis) 09/24/2014  . History of solitary pulmonary nodule 03/21/2008  . History of right breast cancer 03/21/2008    Current Outpatient Medications on File Prior to Visit  Medication Sig Dispense Refill  . acetaminophen (TYLENOL) 650 MG CR tablet Take 1,300 mg by mouth every 8 (eight) hours as needed for pain. Reported on 09/23/2015    . albuterol (PROVENTIL HFA;VENTOLIN HFA) 108 (90 Base) MCG/ACT inhaler Inhale 2 puffs into the lungs every 6 (six) hours as needed for wheezing or shortness of breath. 1 Inhaler 3  . aspirin EC 81 MG tablet Take 1 tablet (81 mg total) by mouth daily. 90 tablet 3  . cefUROXime (CEFTIN) 500 MG tablet Take 1 tablet (500 mg total) by mouth 2 (two) times daily with a meal. (Patient not taking: Reported on 10/26/2019) 14 tablet 0   . FeFum-FePoly-FA-B Cmp-C-Biot (INTEGRA PLUS) CAPS Take 1 capsule by mouth every morning. 30 capsule 2  . hydrALAZINE (APRESOLINE) 25 MG tablet Take 1 tablet (25 mg total) by mouth 3 (three) times daily. 270 tablet 2  . isosorbide mononitrate (IMDUR) 30 MG 24 hr tablet Take 1 tablet (30 mg total) by mouth daily. 30 tablet 11  . linagliptin (TRADJENTA) 5 MG TABS tablet Take 1 tablet (5 mg total) by mouth daily. 90 tablet 3  . Multiple Vitamin (MULTIVITAMIN WITH MINERALS) TABS tablet Take 1 tablet by mouth daily.    . nitroGLYCERIN (NITROSTAT) 0.4 MG SL tablet Place 1 tablet (0.4 mg total) under the tongue every 5 (five) minutes as needed for chest pain. (Patient not taking: Reported on 10/26/2019) 25 tablet prn  . rosuvastatin (CRESTOR) 20 MG tablet Take 1 tablet (20 mg total) by mouth daily. 90 tablet 3  . triamterene-hydrochlorothiazide (MAXZIDE-25) 37.5-25 MG tablet Take 1 tablet by mouth daily. 90 tablet 1  . Vitamin D, Ergocalciferol, (DRISDOL) 1.25 MG (50000 UNIT) CAPS capsule One capsule by mouth once a week for 12 weeks. Then take 2000IU/day 12 capsule 0   No current facility-administered medications on file prior to visit.    Allergies  Allergen Reactions  . Lisinopril Anaphylaxis  . Codeine     REACTION: MAKES HER SLEEPY    Objective: Physical Exam  General: [name] Cooper is a pleasant 69 y.o.  African American female, morbidly obese, in NAD. AAO x 3.   Vascular:  Neurovascular status unchanged b/l lower extremities. Capillary refill time to digits immediate b/l.  Palpable DP pulses b/l. Palpable PT pulses b/l. Pedal hair sparse b/l. Skin temperature gradient within normal limits b/l.  Dermatological:  Pedal skin with normal turgor, texture and tone bilaterally. No open wounds bilaterally. No interdigital macerations bilaterally. Toenails 1-5 right, L 2nd toe, L 3rd toe, L 4th toe and L 5th toe elongated, discolored, dystrophic, thickened, and crumbly with subungual debris and  tenderness to dorsal palpation. Anonychia noted L hallux. Nailbed(s) epithelialized.   Musculoskeletal:  Normal muscle strength 5/5 to all lower extremity muscle groups bilaterally. No pain crepitus or joint limitation noted with ROM b/l. No gross bony deformities bilaterally.  Neurological:  Protective sensation intact 5/5 intact bilaterally with 10g monofilament b/l. Vibratory sensation intact b/l.  Assessment and Plan:  1. Pain due to onychomycosis of toenails of both feet   2. Diabetes mellitus without complication ([place])     -Examined patient. -No new findings. No new orders. -Toenails 1-5 right, L 2nd toe, L 3rd toe, L 4th toe and L 5th toe debrided in length and girth without iatrogenic bleeding with sterile nail nipper and dremel.  -Patient to report any pedal injuries to medical professional immediately. -Patient to continue soft, supportive shoe gear daily. -Patient/POA to call should there be question/concern in the interim.  Return in about 3 months (around 04/03/2020) for diabetic nail trim.  Marzetta Board, DPM

## 2020-01-08 ENCOUNTER — Inpatient Hospital Stay: Payer: Medicare Other

## 2020-01-08 ENCOUNTER — Other Ambulatory Visit: Payer: Self-pay

## 2020-01-08 VITALS — BP 129/73 | HR 68 | Temp 98.2°F | Resp 20

## 2020-01-08 DIAGNOSIS — D509 Iron deficiency anemia, unspecified: Secondary | ICD-10-CM | POA: Diagnosis not present

## 2020-01-08 MED ORDER — SODIUM CHLORIDE 0.9 % IV SOLN
200.0000 mg | Freq: Once | INTRAVENOUS | Status: AC
  Administered 2020-01-08: 200 mg via INTRAVENOUS
  Filled 2020-01-08: qty 200

## 2020-01-08 MED ORDER — ACETAMINOPHEN 325 MG PO TABS
ORAL_TABLET | ORAL | Status: AC
  Filled 2020-01-08: qty 2

## 2020-01-08 MED ORDER — SODIUM CHLORIDE 0.9 % IV SOLN
INTRAVENOUS | Status: DC
  Filled 2020-01-08: qty 250

## 2020-01-08 MED ORDER — DIPHENHYDRAMINE HCL 25 MG PO CAPS
50.0000 mg | ORAL_CAPSULE | Freq: Once | ORAL | Status: AC
  Administered 2020-01-08: 50 mg via ORAL

## 2020-01-08 MED ORDER — ACETAMINOPHEN 325 MG PO TABS
650.0000 mg | ORAL_TABLET | Freq: Once | ORAL | Status: AC
  Administered 2020-01-08: 650 mg via ORAL

## 2020-01-08 MED ORDER — DIPHENHYDRAMINE HCL 25 MG PO CAPS
ORAL_CAPSULE | ORAL | Status: AC
  Filled 2020-01-08: qty 2

## 2020-01-08 NOTE — Patient Instructions (Signed)

## 2020-01-10 ENCOUNTER — Other Ambulatory Visit: Payer: Self-pay

## 2020-01-10 MED ORDER — ISOSORBIDE MONONITRATE ER 30 MG PO TB24
30.0000 mg | ORAL_TABLET | Freq: Every day | ORAL | 2 refills | Status: DC
Start: 2020-01-10 — End: 2020-10-14

## 2020-01-10 NOTE — Telephone Encounter (Signed)
Pt's medication was sent to pt's pharmacy as requested. Confirmation received.  °

## 2020-01-11 DIAGNOSIS — N3000 Acute cystitis without hematuria: Secondary | ICD-10-CM | POA: Diagnosis not present

## 2020-01-15 ENCOUNTER — Other Ambulatory Visit: Payer: Self-pay

## 2020-01-15 ENCOUNTER — Inpatient Hospital Stay: Payer: Medicare Other

## 2020-01-15 VITALS — BP 128/68 | HR 75 | Temp 98.8°F | Resp 20

## 2020-01-15 DIAGNOSIS — D509 Iron deficiency anemia, unspecified: Secondary | ICD-10-CM

## 2020-01-15 MED ORDER — DIPHENHYDRAMINE HCL 25 MG PO CAPS
ORAL_CAPSULE | ORAL | Status: AC
  Filled 2020-01-15: qty 2

## 2020-01-15 MED ORDER — SODIUM CHLORIDE 0.9 % IV SOLN
Freq: Once | INTRAVENOUS | Status: AC
  Filled 2020-01-15: qty 250

## 2020-01-15 MED ORDER — DIPHENHYDRAMINE HCL 25 MG PO CAPS
50.0000 mg | ORAL_CAPSULE | Freq: Once | ORAL | Status: AC
  Administered 2020-01-15: 50 mg via ORAL

## 2020-01-15 MED ORDER — ACETAMINOPHEN 325 MG PO TABS
650.0000 mg | ORAL_TABLET | Freq: Once | ORAL | Status: AC
  Administered 2020-01-15: 650 mg via ORAL

## 2020-01-15 MED ORDER — ACETAMINOPHEN 325 MG PO TABS
ORAL_TABLET | ORAL | Status: AC
  Filled 2020-01-15: qty 2

## 2020-01-15 MED ORDER — SODIUM CHLORIDE 0.9 % IV SOLN
200.0000 mg | Freq: Once | INTRAVENOUS | Status: AC
  Administered 2020-01-15: 200 mg via INTRAVENOUS
  Filled 2020-01-15: qty 200

## 2020-01-15 NOTE — Patient Instructions (Signed)

## 2020-01-15 NOTE — Progress Notes (Signed)
Patient refused 30 post observation after venofer infusion.  Patient ambulated from facility in stable condition with no complaints.

## 2020-01-22 ENCOUNTER — Inpatient Hospital Stay: Payer: Medicare Other | Attending: Physician Assistant

## 2020-01-22 ENCOUNTER — Other Ambulatory Visit: Payer: Self-pay

## 2020-01-22 VITALS — BP 128/63 | HR 81 | Temp 98.1°F | Resp 16

## 2020-01-22 DIAGNOSIS — K2100 Gastro-esophageal reflux disease with esophagitis, without bleeding: Secondary | ICD-10-CM | POA: Insufficient documentation

## 2020-01-22 DIAGNOSIS — K31819 Angiodysplasia of stomach and duodenum without bleeding: Secondary | ICD-10-CM | POA: Insufficient documentation

## 2020-01-22 DIAGNOSIS — D509 Iron deficiency anemia, unspecified: Secondary | ICD-10-CM | POA: Diagnosis not present

## 2020-01-22 MED ORDER — DIPHENHYDRAMINE HCL 25 MG PO CAPS
50.0000 mg | ORAL_CAPSULE | Freq: Once | ORAL | Status: AC
  Administered 2020-01-22: 50 mg via ORAL

## 2020-01-22 MED ORDER — ACETAMINOPHEN 325 MG PO TABS
ORAL_TABLET | ORAL | Status: AC
  Filled 2020-01-22: qty 2

## 2020-01-22 MED ORDER — SODIUM CHLORIDE 0.9 % IV SOLN
INTRAVENOUS | Status: DC
  Filled 2020-01-22: qty 250

## 2020-01-22 MED ORDER — DIPHENHYDRAMINE HCL 25 MG PO CAPS
ORAL_CAPSULE | ORAL | Status: AC
  Filled 2020-01-22: qty 2

## 2020-01-22 MED ORDER — SODIUM CHLORIDE 0.9 % IV SOLN
200.0000 mg | Freq: Once | INTRAVENOUS | Status: AC
  Administered 2020-01-22: 200 mg via INTRAVENOUS
  Filled 2020-01-22: qty 10

## 2020-01-22 MED ORDER — ACETAMINOPHEN 325 MG PO TABS
650.0000 mg | ORAL_TABLET | Freq: Once | ORAL | Status: AC
  Administered 2020-01-22: 650 mg via ORAL

## 2020-01-22 NOTE — Patient Instructions (Signed)

## 2020-02-09 ENCOUNTER — Other Ambulatory Visit: Payer: Self-pay | Admitting: Physician Assistant

## 2020-02-09 DIAGNOSIS — D508 Other iron deficiency anemias: Secondary | ICD-10-CM

## 2020-02-12 ENCOUNTER — Inpatient Hospital Stay: Payer: Medicare Other

## 2020-02-12 ENCOUNTER — Inpatient Hospital Stay (HOSPITAL_BASED_OUTPATIENT_CLINIC_OR_DEPARTMENT_OTHER): Payer: Medicare Other | Admitting: Physician Assistant

## 2020-02-12 ENCOUNTER — Other Ambulatory Visit: Payer: Self-pay

## 2020-02-12 VITALS — BP 122/78 | HR 72 | Temp 97.5°F | Resp 17 | Ht 66.0 in | Wt 293.8 lb

## 2020-02-12 DIAGNOSIS — K31819 Angiodysplasia of stomach and duodenum without bleeding: Secondary | ICD-10-CM | POA: Diagnosis not present

## 2020-02-12 DIAGNOSIS — D508 Other iron deficiency anemias: Secondary | ICD-10-CM

## 2020-02-12 DIAGNOSIS — D509 Iron deficiency anemia, unspecified: Secondary | ICD-10-CM | POA: Diagnosis not present

## 2020-02-12 DIAGNOSIS — K2100 Gastro-esophageal reflux disease with esophagitis, without bleeding: Secondary | ICD-10-CM | POA: Diagnosis not present

## 2020-02-12 LAB — IRON AND TIBC
Iron: 57 ug/dL (ref 41–142)
Saturation Ratios: 22 % (ref 21–57)
TIBC: 260 ug/dL (ref 236–444)
UIBC: 203 ug/dL (ref 120–384)

## 2020-02-12 LAB — CBC WITH DIFFERENTIAL (CANCER CENTER ONLY)
Abs Immature Granulocytes: 0.04 10*3/uL (ref 0.00–0.07)
Basophils Absolute: 0.1 10*3/uL (ref 0.0–0.1)
Basophils Relative: 1 %
Eosinophils Absolute: 0.5 10*3/uL (ref 0.0–0.5)
Eosinophils Relative: 5 %
HCT: 39 % (ref 36.0–46.0)
Hemoglobin: 12 g/dL (ref 12.0–15.0)
Immature Granulocytes: 0 %
Lymphocytes Relative: 35 %
Lymphs Abs: 4.2 10*3/uL — ABNORMAL HIGH (ref 0.7–4.0)
MCH: 22.7 pg — ABNORMAL LOW (ref 26.0–34.0)
MCHC: 30.8 g/dL (ref 30.0–36.0)
MCV: 73.9 fL — ABNORMAL LOW (ref 80.0–100.0)
Monocytes Absolute: 1.1 10*3/uL — ABNORMAL HIGH (ref 0.1–1.0)
Monocytes Relative: 10 %
Neutro Abs: 5.9 10*3/uL (ref 1.7–7.7)
Neutrophils Relative %: 49 %
Platelet Count: 281 10*3/uL (ref 150–400)
RBC: 5.28 MIL/uL — ABNORMAL HIGH (ref 3.87–5.11)
RDW: 17.5 % — ABNORMAL HIGH (ref 11.5–15.5)
WBC Count: 11.8 10*3/uL — ABNORMAL HIGH (ref 4.0–10.5)
nRBC: 0 % (ref 0.0–0.2)

## 2020-02-12 LAB — FERRITIN: Ferritin: 119 ng/mL (ref 11–307)

## 2020-02-12 NOTE — Progress Notes (Signed)
[place] OFFICE PROGRESS NOTE  [name], MD [place] [address]  DIAGNOSIS: Iron deficiency anemia  PRIOR THERAPY: None  CURRENT THERAPY:  1) IV iron PRN with venofer. Last given on 01/22/20 2) Oral iron supplement with integra plus p.o. daily.  INTERVAL HISTORY: [name] Cooper 69 y.o. female returns to the clinic today forafollow-up visit. The patient was initially referred to the clinic for evaluation of microcytic anemia. The patient had stool cards performed to assess for GI blood loss. The patient's stool cards came back positive for blood. She saw her gastroenterologist, Dr. Henrene Pastor, who performed a upper endoscopy and colonoscopy on 10/26/19. 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   Her last IV iron infusion was on 01/22/20. She states she noticed an improvement in her fatigue with her IV iron. She is also compliant with her oral iron supplements. She does note that they cause constipation. She denies any  chest pain, palpitations, or lightheadedness.She reports her baseline dyspnea on exertion due to her COPD. She denies any abnormal bleeding or bruising including epistaxis, gingival bleeding,hemoptysis, melena, hematemesis, hematochezia, or hematuria.She is here today for evaluation and a repeat CBC, iron studies, and ferritin.  MEDICAL HISTORY: Past Medical History:  Diagnosis Date  . Anemia   . Arthritis   . Cancer ([place])    breast and ovarian  . Chicken pox   . Chronic lower back pain   . COPD (chronic obstructive pulmonary disease) ([place])   . Diabetes ([place])   . GERD (gastroesophageal reflux disease)   . Hepatitis 1970's  . History of recurrent UTIs   . Hyperlipemia   . Hypertension   . Slow heart rate dx'd 09/24/2014  . Urine incontinence     ALLERGIES:  is allergic to lisinopril and  codeine.  MEDICATIONS:  Current Outpatient Medications  Medication Sig Dispense Refill  . acetaminophen (TYLENOL) 650 MG CR tablet Take 1,300 mg by mouth every 8 (eight) hours as needed for pain. Reported on 09/23/2015    . albuterol (PROVENTIL HFA;VENTOLIN HFA) 108 (90 Base) MCG/ACT inhaler Inhale 2 puffs into the lungs every 6 (six) hours as needed for wheezing or shortness of breath. 1 Inhaler 3  . Ascorbic Acid (VITAMIN C) 500 MG CAPS Take 500 mg by mouth 2 (two) times daily.    Marland Kitchen aspirin EC 81 MG tablet Take 1 tablet (81 mg total) by mouth daily. 90 tablet 3  . CRANBERRY EXTRACT PO Take 400 mg by mouth 2 (two) times daily. 2 tablets twice a day    . FeFum-FePoly-FA-B Cmp-C-Biot (INTEGRA PLUS) CAPS Take 1 capsule by mouth every morning. 30 capsule 2  . hydrALAZINE (APRESOLINE) 25 MG tablet Take 1 tablet (25 mg total) by mouth 3 (three) times daily. 270 tablet 2  . isosorbide mononitrate (IMDUR) 30 MG 24 hr tablet Take 1 tablet (30 mg total) by mouth daily. 90 tablet 2  . Lactobacillus (ACIDOPHILUS PO) Take 5 mg by mouth daily.    Marland Kitchen linagliptin (TRADJENTA) 5 MG TABS tablet Take 1 tablet (5 mg total) by mouth daily. 90 tablet 3  . Multiple Vitamin (MULTIVITAMIN WITH MINERALS) TABS tablet Take 1 tablet by mouth daily.    . nitrofurantoin, macrocrystal-monohydrate, (MACROBID) 100 MG capsule Take 100 mg by mouth 2 (two) times daily.    . nitroGLYCERIN (NITROSTAT) 0.4 MG SL tablet Place 1  tablet (0.4 mg total) under the tongue every 5 (five) minutes as needed for chest pain. (Patient not taking: Reported on 10/26/2019) 25 tablet prn  . rosuvastatin (CRESTOR) 20 MG tablet Take 1 tablet (20 mg total) by mouth daily. 90 tablet 3  . triamterene-hydrochlorothiazide (MAXZIDE-25) 37.5-25 MG tablet Take 1 tablet by mouth daily. 90 tablet 1  . Vitamin D, Ergocalciferol, (DRISDOL) 1.25 MG (50000 UNIT) CAPS capsule One capsule by mouth once a week for 12 weeks. Then take 2000IU/day 12 capsule 0   No current  facility-administered medications for this visit.    SURGICAL HISTORY:  Past Surgical History:  Procedure Laterality Date  . ABDOMINAL HYSTERECTOMY  1984  . APPENDECTOMY    . BREAST BIOPSY Right 1996  . BREAST LUMPECTOMY Right 1996  . LEFT HEART CATH AND CORONARY ANGIOGRAPHY N/A 01/13/2019   Procedure: LEFT HEART CATH AND CORONARY ANGIOGRAPHY;  Surgeon: Nelva Bush, MD;  Location: [facility];  Service: Cardiovascular;  Laterality: N/A;  . MASTECTOMY Right 1996  . TONSILLECTOMY      REVIEW OF SYSTEMS:   Constitutional: Negative for appetite change, chills, fatigue, fever and unexpected weight change.  HENT: Negative for mouth sores, nosebleeds, sore throat and trouble swallowing.  Eyes: Negative for eye problems and icterus.  Respiratory:Positive for baseline dyspnea on exertiondue to COPD.Negative for cough, hemoptysis, and wheezing.  Cardiovascular: Negative for chest pain and leg swelling.  Gastrointestinal:Negative for abdominal pain, constipation, melena, hematochezia, diarrhea, nausea and vomiting.  Genitourinary: Negative for bladder incontinence, difficulty urinating, dysuria, frequency and hematuria.  Musculoskeletal: Negative for back pain, gait problem, neck pain and neck stiffness.  Skin: Negative for itching and rash.  Neurological: Negative for dizziness, extremity weakness, gait problem, headaches, light-headedness and seizures.  Hematological: Negative for adenopathy. Does not bruise/bleed easily.  Psychiatric/Behavioral: Negative for confusion, depression and sleep disturbance. The patient is not nervous/anxious.   PHYSICAL EXAMINATION:  Blood pressure 122/78, pulse 72, temperature (!) 97.5 F (36.4 C), temperature source Tympanic, resp. rate 17, height 5\' 6"  (1.676 m), weight 293 lb 12.8 oz (133.3 kg), SpO2 99 %.  ECOG PERFORMANCE STATUS: 1 - Symptomatic but completely ambulatory  Physical Exam  Constitutional: Oriented to person, place, and  time and well-developed, well-nourished, and in no distress. No distress.  HENT:  Head: Normocephalic and atraumatic.  Mouth/Throat: Oropharynx is clear and moist. No oropharyngeal exudate.  Eyes: Conjunctivae are normal. Right eye exhibits no discharge. Left eye exhibits no discharge. No scleral icterus.  Neck: Normal range of motion. Neck supple.  Cardiovascular: Normal rate, regular rhythm, normal heart sounds and intact distal pulses.   Pulmonary/Chest: Effort normal. Quiet breath sounds in all lung fields. No respiratory distress. No wheezes. No rales.  Abdominal: Soft. Bowel sounds are normal. Exhibits no distension and no mass. There is no tenderness.  Musculoskeletal: Normal range of motion. Exhibits no edema.  Lymphadenopathy:    No cervical adenopathy.  Neurological: Alert and oriented to person, place, and time. Exhibits normal muscle tone. Gait normal. Coordination normal.  Skin: Skin is warm and dry. No rash noted. Not diaphoretic. No erythema. No pallor.  Psychiatric: Mood, memory and judgment normal.  Vitals reviewed.  LABORATORY DATA: Lab Results  Component Value Date   WBC 11.8 (H) 02/12/2020   HGB 12.0 02/12/2020   HCT 39.0 02/12/2020   MCV 73.9 (L) 02/12/2020   PLT 281 02/12/2020      Chemistry      Component Value Date/Time   NA 140 12/21/2019 1336  NA 140 01/30/2019 1330   K 3.4 (L) 12/21/2019 1336   CL 101 12/21/2019 1336   CO2 31 12/21/2019 1336   BUN 15 12/21/2019 1336   BUN 18 01/30/2019 1330   CREATININE 1.20 (H) 12/21/2019 1336   CREATININE 1.28 (H) 09/21/2016 1115      Component Value Date/Time   CALCIUM 9.4 12/21/2019 1336   ALKPHOS 98 12/21/2019 1336   AST 12 (L) 12/21/2019 1336   ALT 8 12/21/2019 1336   BILITOT 0.4 12/21/2019 1336       RADIOGRAPHIC STUDIES:  No results found.   ASSESSMENT/PLAN:  This is a very pleasant 69 year old African-American female referred to the clinic for evaluation ofiron deficiencyanemia.  She  has been taking Integra +1 capsule p.o. daily.  She also receives IV iron with Venofer weekly x4 as needed.Last dose on 01/22/20  The patient had a repeat CBC, ferritin, and iron studies performed. The patient's CBC notes improvement in her hbg to 12.0 today. Her ferritin is 119. Her iron studies are WNL as well. No need for IV supplements at this time.   We will see her back for a follow up visit in 3 months for evaluation and repeat CBC, ferritin, and iron studies.   She will continue to take the oral iron supplement p.o. daily for now. Discussed she may take a stool softener to help with constiaption.    She had a colonoscopy and endoscopy performed by Dr. Henrene Pastor on 10/26/19 which showed distal esophagitis as evidenced by several erosions and several diminutive AVMs in the duodenum   The patient was advised to call immediately if she has any concerning symptoms in the interval. The patient voices understanding of current disease status and treatment options and is in agreement with the current care plan. All questions were answered. The patient knows to call the clinic with any problems, questions or concerns. We can certainly see the patient much sooner if necessary   Orders Placed This Encounter  Procedures  . Ferritin    Standing Status:   Future    Standing Expiration Date:   02/11/2021  . Iron and TIBC    Standing Status:   Future    Standing Expiration Date:   02/11/2021  . CBC with Differential (Cancer Center Only)    Standing Status:   Future    Standing Expiration Date:   02/11/2021     Eason Housman L Magaret Justo, PA-C 02/12/20

## 2020-03-04 NOTE — Progress Notes (Signed)
Cardiology Office Note:    Date:  03/06/2020   ID:  [name] Cooper, DOB 02/11/1951, MRN [id number]  PCP:  [name], MD  [facility] Cardiologist:  Candee Furbish, MD  [facility] Electrophysiologist:  None   Referring MD: [name], MD     History of Present Illness:    [name] Cooper is a 69 y.o. female here for the follow-up of coronary artery disease post catheterization with 80% LAD lesion that was treated medically since she was not having any significant anginal symptoms.  Atherosclerosis/coronary calcification seen on CT scan and LAD and circumflex distribution as well as mild mid to distal RCA.  Has had shortness of breath related to tobacco use and COPD.  Followed by pulmonary medicine as well.  No early family history of coronary artery disease.  Since her last visit, has quit smoking with nicotine patch.  Excellent.  Not feeling any chest pain or shortness of breath.  No syncope no bleeding.  No side effects with medications.  On high intensity Crestor LDL is at goal at 69.  Excellent.  Past Medical History:  Diagnosis Date  . Anemia   . Arthritis   . Cancer ([place])    breast and ovarian  . Chicken pox   . Chronic lower back pain   . COPD (chronic obstructive pulmonary disease) ([place])   . Diabetes ([place])   . GERD (gastroesophageal reflux disease)   . Hepatitis 1970's  . History of recurrent UTIs   . Hyperlipemia   . Hypertension   . Slow heart rate dx'd 09/24/2014  . Urine incontinence     Past Surgical History:  Procedure Laterality Date  . ABDOMINAL HYSTERECTOMY  1984  . APPENDECTOMY    . BREAST BIOPSY Right 1996  . BREAST LUMPECTOMY Right 1996  . LEFT HEART CATH AND CORONARY ANGIOGRAPHY N/A 01/13/2019   Procedure: LEFT HEART CATH AND CORONARY ANGIOGRAPHY;  Surgeon: Nelva Bush, MD;  Location: [facility];  Service: Cardiovascular;  Laterality: N/A;  . MASTECTOMY Right 1996  . TONSILLECTOMY      Current Medications: Current  Meds  Medication Sig  . acetaminophen (TYLENOL) 650 MG CR tablet Take 1,300 mg by mouth every 8 (eight) hours as needed for pain. Reported on 09/23/2015  . albuterol (PROVENTIL HFA;VENTOLIN HFA) 108 (90 Base) MCG/ACT inhaler Inhale 2 puffs into the lungs every 6 (six) hours as needed for wheezing or shortness of breath.  . Ascorbic Acid (VITAMIN C) 500 MG CAPS Take 500 mg by mouth 2 (two) times daily.  Marland Kitchen aspirin EC 81 MG tablet Take 1 tablet (81 mg total) by mouth daily.  Marland Kitchen CRANBERRY EXTRACT PO Take 400 mg by mouth 2 (two) times daily. 2 tablets twice a day  . FeFum-FePoly-FA-B Cmp-C-Biot (INTEGRA PLUS) CAPS Take 1 capsule by mouth every morning.  . hydrALAZINE (APRESOLINE) 25 MG tablet Take 1 tablet (25 mg total) by mouth 3 (three) times daily.  . isosorbide mononitrate (IMDUR) 30 MG 24 hr tablet Take 1 tablet (30 mg total) by mouth daily.  . Lactobacillus (ACIDOPHILUS PO) Take 5 mg by mouth daily.  Marland Kitchen linagliptin (TRADJENTA) 5 MG TABS tablet Take 1 tablet (5 mg total) by mouth daily.  . Multiple Vitamin (MULTIVITAMIN WITH MINERALS) TABS tablet Take 1 tablet by mouth daily.  . nitrofurantoin, macrocrystal-monohydrate, (MACROBID) 100 MG capsule Take 100 mg by mouth 2 (two) times daily.  . nitroGLYCERIN (NITROSTAT) 0.4 MG SL tablet Place 1 tablet (0.4 mg total) under the tongue  every 5 (five) minutes as needed for chest pain.  . rosuvastatin (CRESTOR) 20 MG tablet Take 1 tablet (20 mg total) by mouth daily.  Marland Kitchen triamterene-hydrochlorothiazide (MAXZIDE-25) 37.5-25 MG tablet Take 1 tablet by mouth daily.  . Vitamin D, Ergocalciferol, (DRISDOL) 1.25 MG (50000 UNIT) CAPS capsule One capsule by mouth once a week for 12 weeks. Then take 2000IU/day     Allergies:   Lisinopril and Codeine   Social History   Socioeconomic History  . Marital status: Widowed    Spouse name: Not on file  . Number of children: 1  . Years of education: Not on file  . Highest education level: Not on file  Occupational  History  . Occupation: Retired     Fish farm manager: [employer]  Tobacco Use  . Smoking status: Former Smoker    Packs/day: 0.75    Years: 47.00    Pack years: 35.25    Types: Cigarettes  . Smokeless tobacco: Never Used  . Tobacco comment: quit date: 01/19/2019  Vaping Use  . Vaping Use: Never used  Substance and Sexual Activity  . Alcohol use: No    Alcohol/week: 0.0 standard drinks  . Drug use: No    Types: "Crack" cocaine    Comment: 09/24/2014 "last crack was in 2013"  . Sexual activity: Not on file  Other Topics Concern  . Not on file  Social History Narrative   3 grand children    9 great grandchildren    Social Determinants of Health   Financial Resource Strain:   . Difficulty of Paying Living Expenses: Not on file  Food Insecurity:   . Worried About Charity fundraiser in the Last Year: Not on file  . Ran Out of Food in the Last Year: Not on file  Transportation Needs:   . Lack of Transportation (Medical): Not on file  . Lack of Transportation (Non-Medical): Not on file  Physical Activity:   . Days of Exercise per Week: Not on file  . Minutes of Exercise per Session: Not on file  Stress:   . Feeling of Stress : Not on file  Social Connections:   . Frequency of Communication with Friends and Family: Not on file  . Frequency of Social Gatherings with Friends and Family: Not on file  . Attends Religious Services: Not on file  . Active Member of Clubs or Organizations: Not on file  . Attends Archivist Meetings: Not on file  . Marital Status: Not on file     Family History: The patient's family history includes Arthritis in her brother; Cancer in her mother; Early death in her mother. There is no history of Colon cancer, Esophageal cancer, Rectal cancer, or Stomach cancer.  ROS:   Please see the history of present illness.     All other systems reviewed and are negative.  EKGs/Labs/Other Studies Reviewed:    The following studies were reviewed  today:  Cath 01/13/19: 1. Significant single vessel coronary artery disease with moderately to severely calcified 80% mid LAD stenosis. 2. Mild, non-obstructive coronary artery disease involving the LCx and RCA. 3. Normal left ventricular systolic function and filling pressure.  Recommendations: 1. Given lack of chest pain (though dyspnea on exertion may be anginal equivalent) and no ongoing antianginal therapy, I recommend trial of isosorbide mononitrate. If Ms. Molzahn has persistent symptoms despite optimal medical therapy at follow-up, PCI to mid LAD with orbital atherectomy will need to be considered. 2. Aggressive secondary prevention.  Diagnostic  Dominance: Right    Recent Labs: 12/21/2019: ALT 8; BUN 15; Creatinine 1.20; Potassium 3.4; Sodium 140 02/12/2020: Hemoglobin 12.0; Platelet Count 281  Recent Lipid Panel    Component Value Date/Time   CHOL 153 05/15/2019 1200   TRIG 95 05/15/2019 1200   HDL 67 05/15/2019 1200   CHOLHDL 2.3 05/15/2019 1200   CHOLHDL 5 06/07/2018 1446   VLDL 62.2 (H) 06/07/2018 1446   LDLCALC 69 05/15/2019 1200   LDLDIRECT 159.0 06/07/2018 1446     Risk Assessment/Calculations:       Physical Exam:    VS:  BP 110/66   Pulse 68   Ht 5\' 6"  (1.676 m)   Wt 294 lb (133.4 kg)   LMP  (LMP Unknown)   SpO2 96%   BMI 47.45 kg/m     Wt Readings from Last 3 Encounters:  03/06/20 294 lb (133.4 kg)  02/12/20 293 lb 12.8 oz (133.3 kg)  12/21/19 288 lb 6.4 oz (130.8 kg)     GEN:  Well nourished, well developed in no acute distress HEENT: Normal NECK: No JVD; No carotid bruits LYMPHATICS: No lymphadenopathy CARDIAC: RRR, no murmurs, rubs, gallops RESPIRATORY:  Clear to auscultation without rales, wheezing or rhonchi  ABDOMEN: Soft, non-tender, non-distended MUSCULOSKELETAL:  No edema; No deformity  SKIN: Warm and dry NEUROLOGIC:  Alert and oriented x 3 PSYCHIATRIC:  Normal affect   ASSESSMENT:    1. Atherosclerosis of native coronary  artery of native heart without angina pectoris   2. Mixed hyperlipidemia   3. Morbid obesity ([place])    PLAN:    In order of problems listed above:  CAD -Densely calcified LAD lesion 80%.  At the time of cardiac catheterization on 01/13/2019 she was not having any significant anginal symptoms.  Isosorbide was initiated.  Continue with medical management.  Obviously if anginal symptoms were to develop-consider percutaneous intervention. -Aspirin 81 -Crestor 20mg . Last LDL from outside labs 69 on 05/15/2019.  ALT 8.  Excellent.  No myalgias. -Hypertension control  Hyperlipidemia -Crestor 20 mg.  No myalgias.  Doing well.  Continue current medication management  Morbid obesity -Continue to encourage weight loss with decrease carbohydrates.  Tobacco use -Excellent job quitting smoking.  She use the nicotine patch.  Continued cessation.  She stated that her COPD was making it hard to breathe with her smoking.  COPD -Dr. Melvyn Novas prior office note reviewed.  Continue with inhaler.  Shared Decision Making/Informed Consent        Medication Adjustments/Labs and Tests Ordered: Current medicines are reviewed at length with the patient today.  Concerns regarding medicines are outlined above.  No orders of the defined types were placed in this encounter.  No orders of the defined types were placed in this encounter.   Patient Instructions  Medication Instructions:   Your physician recommends that you continue on your current medications as directed. Please refer to the Current Medication list given to you today.  *If you need a refill on your cardiac medications before your next appointment, please call your pharmacy*   Follow-Up: At [facility], you and your health needs are our priority.  As part of our continuing mission to provide you with exceptional heart care, we have created designated Provider Care Teams.  These Care Teams include your primary Cardiologist (physician) and  Advanced Practice Providers (APPs -  Physician Assistants and Nurse Practitioners) who all work together to provide you with the care you need, when you need it.  We recommend signing  up for the patient portal called "MyChart".  Sign up information is provided on this After Visit Summary.  MyChart is used to connect with patients for Virtual Visits (Telemedicine).  Patients are able to view lab/test results, encounter notes, upcoming appointments, etc.  Non-urgent messages can be sent to your provider as well.   To learn more about what you can do with MyChart, go to [email or web address].    Your next appointment:   1 year(s)  The format for your next appointment:   In Person  Provider:   Candee Furbish, MD        Signed, Candee Furbish, MD  03/06/2020 9:48 AM    [place]

## 2020-03-06 ENCOUNTER — Ambulatory Visit (INDEPENDENT_AMBULATORY_CARE_PROVIDER_SITE_OTHER): Payer: Medicare Other | Admitting: Cardiology

## 2020-03-06 ENCOUNTER — Other Ambulatory Visit: Payer: Self-pay

## 2020-03-06 ENCOUNTER — Encounter: Payer: Self-pay | Admitting: Cardiology

## 2020-03-06 VITALS — BP 110/66 | HR 68 | Ht 66.0 in | Wt 294.0 lb

## 2020-03-06 DIAGNOSIS — I2510 Atherosclerotic heart disease of native coronary artery without angina pectoris: Secondary | ICD-10-CM

## 2020-03-06 DIAGNOSIS — E782 Mixed hyperlipidemia: Secondary | ICD-10-CM | POA: Diagnosis not present

## 2020-03-06 NOTE — Patient Instructions (Signed)
Medication Instructions:   Your physician recommends that you continue on your current medications as directed. Please refer to the Current Medication list given to you today.  *If you need a refill on your cardiac medications before your next appointment, please call your pharmacy*   Follow-Up: At [facility], you and your health needs are our priority.  As part of our continuing mission to provide you with exceptional heart care, we have created designated Provider Care Teams.  These Care Teams include your primary Cardiologist (physician) and Advanced Practice Providers (APPs -  Physician Assistants and Nurse Practitioners) who all work together to provide you with the care you need, when you need it.  We recommend signing up for the patient portal called "MyChart".  Sign up information is provided on this After Visit Summary.  MyChart is used to connect with patients for Virtual Visits (Telemedicine).  Patients are able to view lab/test results, encounter notes, upcoming appointments, etc.  Non-urgent messages can be sent to your provider as well.   To learn more about what you can do with MyChart, go to [email or web address].    Your next appointment:   1 year(s)  The format for your next appointment:   In Person  Provider:   Candee Furbish, MD

## 2020-03-29 DIAGNOSIS — E119 Type 2 diabetes mellitus without complications: Secondary | ICD-10-CM | POA: Diagnosis not present

## 2020-04-01 ENCOUNTER — Encounter: Payer: Self-pay | Admitting: Podiatry

## 2020-04-01 ENCOUNTER — Ambulatory Visit (INDEPENDENT_AMBULATORY_CARE_PROVIDER_SITE_OTHER): Payer: Medicare Other | Admitting: Podiatry

## 2020-04-01 ENCOUNTER — Other Ambulatory Visit: Payer: Self-pay

## 2020-04-01 DIAGNOSIS — E119 Type 2 diabetes mellitus without complications: Secondary | ICD-10-CM

## 2020-04-01 DIAGNOSIS — M79675 Pain in left toe(s): Secondary | ICD-10-CM

## 2020-04-01 DIAGNOSIS — B351 Tinea unguium: Secondary | ICD-10-CM | POA: Diagnosis not present

## 2020-04-01 DIAGNOSIS — M79674 Pain in right toe(s): Secondary | ICD-10-CM | POA: Diagnosis not present

## 2020-04-04 DIAGNOSIS — R8271 Bacteriuria: Secondary | ICD-10-CM | POA: Diagnosis not present

## 2020-04-04 DIAGNOSIS — N3000 Acute cystitis without hematuria: Secondary | ICD-10-CM | POA: Diagnosis not present

## 2020-04-07 NOTE — Progress Notes (Signed)
Subjective: [name] Cooper is a pleasant 69 y.o. female patient seen today preventative diabetic foot care and painful mycotic nails b/l that are difficult to trim. Pain interferes with ambulation. Aggravating factors include wearing enclosed shoe gear. Pain is relieved with periodic professional debridement.   She voices no new pedal concerns on today's visit.  Patient Active Problem List   Diagnosis Date Noted  . CKD (chronic kidney disease) stage 3, GFR 30-59 ml/min (HCC) 10/11/2019  . Heme positive stool 09/14/2019  . Iron deficiency anemia 07/31/2019  . Diabetes mellitus without complication ([place]) 63/78/5885  . Dyspnea on exertion 01/13/2019  . Coronary artery calcification 01/13/2019  . Morbid (severe) obesity due to excess calories ([place]) 10/12/2018  . History of tobacco abuse 10/12/2018  . Cough 10/11/2018  . Microcytic anemia 06/15/2018  . History of right mastectomy 06/15/2018  . Vitamin D deficiency 06/15/2018  . Emphysema of lung ([place]) 06/15/2018  . Urine, incontinence, stress female 06/15/2018  . Estrogen deficiency 03/30/2016  . Rhinitis, allergic   . HTN (hypertension) 09/24/2014  . Hyperlipidemia 09/24/2014  . OA (osteoarthritis) 09/24/2014  . History of solitary pulmonary nodule 03/21/2008  . History of right breast cancer 03/21/2008    Current Outpatient Medications on File Prior to Visit  Medication Sig Dispense Refill  . acetaminophen (TYLENOL) 650 MG CR tablet Take 1,300 mg by mouth every 8 (eight) hours as needed for pain. Reported on 09/23/2015    . albuterol (PROVENTIL HFA;VENTOLIN HFA) 108 (90 Base) MCG/ACT inhaler Inhale 2 puffs into the lungs every 6 (six) hours as needed for wheezing or shortness of breath. 1 Inhaler 3  . Ascorbic Acid (VITAMIN C) 500 MG CAPS Take 500 mg by mouth 2 (two) times daily.    Marland Kitchen aspirin EC 81 MG tablet Take 1 tablet (81 mg total) by mouth daily. 90 tablet 3  . CRANBERRY EXTRACT PO Take 400 mg by mouth 2 (two) times daily. 2  tablets twice a day    . FeFum-FePoly-FA-B Cmp-C-Biot (INTEGRA PLUS) CAPS Take 1 capsule by mouth every morning. 30 capsule 2  . hydrALAZINE (APRESOLINE) 25 MG tablet Take 1 tablet (25 mg total) by mouth 3 (three) times daily. 270 tablet 2  . isosorbide mononitrate (IMDUR) 30 MG 24 hr tablet Take 1 tablet (30 mg total) by mouth daily. 90 tablet 2  . Lactobacillus (ACIDOPHILUS PO) Take 5 mg by mouth daily.    Marland Kitchen linagliptin (TRADJENTA) 5 MG TABS tablet Take 1 tablet (5 mg total) by mouth daily. 90 tablet 3  . Multiple Vitamin (MULTIVITAMIN WITH MINERALS) TABS tablet Take 1 tablet by mouth daily.    . nitrofurantoin, macrocrystal-monohydrate, (MACROBID) 100 MG capsule Take 100 mg by mouth 2 (two) times daily.    . nitroGLYCERIN (NITROSTAT) 0.4 MG SL tablet Place 1 tablet (0.4 mg total) under the tongue every 5 (five) minutes as needed for chest pain. 25 tablet prn  . rosuvastatin (CRESTOR) 20 MG tablet Take 1 tablet (20 mg total) by mouth daily. 90 tablet 3  . triamterene-hydrochlorothiazide (MAXZIDE-25) 37.5-25 MG tablet Take 1 tablet by mouth daily. 90 tablet 1  . Vitamin D, Ergocalciferol, (DRISDOL) 1.25 MG (50000 UNIT) CAPS capsule One capsule by mouth once a week for 12 weeks. Then take 2000IU/day 12 capsule 0   No current facility-administered medications on file prior to visit.    Allergies  Allergen Reactions  . Lisinopril Anaphylaxis  . Codeine     REACTION: MAKES HER SLEEPY    Objective: Physical Exam  General: [name] Cooper is a pleasant 69 y.o.  African American female, morbidly obese, in NAD. AAO x 3.   Vascular:  Neurovascular status unchanged b/l lower extremities. Capillary refill time to digits immediate b/l. Palpable DP pulses b/l. Palpable PT pulses b/l. Pedal hair sparse b/l. Skin temperature gradient within normal limits b/l.  Dermatological:  Pedal skin with normal turgor, texture and tone bilaterally. No open wounds bilaterally. No interdigital macerations  bilaterally. Toenails 1-5 right, L 2nd toe, L 3rd toe, L 4th toe and L 5th toe elongated, discolored, dystrophic, thickened, and crumbly with subungual debris and tenderness to dorsal palpation. Anonychia noted L hallux. Nailbed(s) epithelialized.   Musculoskeletal:  Normal muscle strength 5/5 to all lower extremity muscle groups bilaterally. No pain crepitus or joint limitation noted with ROM b/l. No gross bony deformities bilaterally.  Neurological:  Protective sensation intact 5/5 intact bilaterally with 10g monofilament b/l. Vibratory sensation intact b/l.  Assessment and Plan:  1. Pain due to onychomycosis of toenails of both feet   2. Diabetes mellitus without complication ([place])     -Examined patient. -No new findings. No new orders. -Toenails 1-5 right, L 2nd toe, L 3rd toe, L 4th toe and L 5th toe debrided in length and girth without iatrogenic bleeding with sterile nail nipper and dremel.  -Patient to report any pedal injuries to medical professional immediately. -Patient to continue soft, supportive shoe gear daily. -Patient/POA to call should there be question/concern in the interim.  Return in about 3 months (around 06/30/2020) for diabetic foot care.  Marzetta Board, DPM

## 2020-04-08 ENCOUNTER — Telehealth: Payer: Self-pay

## 2020-04-08 NOTE — Telephone Encounter (Signed)
Patient is scheduled for 07/08/20

## 2020-04-08 NOTE — Telephone Encounter (Signed)
..   LAST APPOINTMENT DATE: 12/13/2019   NEXT APPOINTMENT DATE:@Visit  date not found  MEDICATION:linagliptin (TRADJENTA) 5 MG TABS tablet  [facility] ([place]), [place] - [address]  CLINICAL FILLS OUT ALL BELOW:   LAST REFILL:  QTY:  REFILL DATE:    OTHER COMMENTS:    Okay for refill?  Please advise

## 2020-04-08 NOTE — Telephone Encounter (Signed)
Please make 6 month f/u for Diabetes with pt.   Thank You

## 2020-04-10 NOTE — Telephone Encounter (Signed)
Pt is following up on this medication. I am slightly confused if we need to schedule her sooner than 06/2020 to get medication or 6 months from now, which would be 09/2020

## 2020-04-11 MED ORDER — LINAGLIPTIN 5 MG PO TABS
5.0000 mg | ORAL_TABLET | Freq: Every day | ORAL | 3 refills | Status: DC
Start: 2020-04-11 — End: 2021-08-08

## 2020-04-11 NOTE — Telephone Encounter (Signed)
Sent in medication for her, but she is due for routine 6 month f/u! Thanks,  Dr. Rogers Blocker

## 2020-04-11 NOTE — Addendum Note (Signed)
Addended by: [name] on: 04/11/2020 01:51 PM   Modules accepted: Orders

## 2020-05-09 ENCOUNTER — Ambulatory Visit (INDEPENDENT_AMBULATORY_CARE_PROVIDER_SITE_OTHER): Payer: 59

## 2020-05-09 DIAGNOSIS — Z0000 Encounter for general adult medical examination without abnormal findings: Secondary | ICD-10-CM

## 2020-05-09 NOTE — Patient Instructions (Addendum)
[name] Cooper , Thank you for taking time to come for your Medicare Wellness Visit. I appreciate your ongoing commitment to your health goals. Please review the following plan we discussed and let me know if I can assist you in the future.   Screening recommendations/referrals: Colonoscopy: Done 10/26/19 Mammogram: Done 11/13/19 Bone Density: Done 06/20/18 Recommended yearly ophthalmology/optometry visit for glaucoma screening and checkup Recommended yearly dental visit for hygiene and checkup  Vaccinations: Influenza vaccine: Done 03/18/20 Up to date Pneumococcal vaccine: Up to date Tdap vaccine: Due and discussed  Shingles vaccine: Shingrix discussed. Please contact your pharmacy for coverage information.    Covid-19:Completed 3/18, 4/13, & 03/18/20  Advanced directives: Copies in chart  Conditions/risks identified: Lose weight   Next appointment: Follow up in one year for your annual wellness visit    Preventive Care 71 Years and Older, Female Preventive care refers to lifestyle choices and visits with your health care provider that can promote health and wellness. What does preventive care include?  A yearly physical exam. This is also called an annual well check.  Dental exams once or twice a year.  Routine eye exams. Ask your health care provider how often you should have your eyes checked.  Personal lifestyle choices, including:  Daily care of your teeth and gums.  Regular physical activity.  Eating a healthy diet.  Avoiding tobacco and drug use.  Limiting alcohol use.  Practicing safe sex.  Taking low-dose aspirin every day.  Taking vitamin and mineral supplements as recommended by your health care provider. What happens during an annual well check? The services and screenings done by your health care provider during your annual well check will depend on your age, overall health, lifestyle risk factors, and family history of disease. Counseling  Your health care  provider may ask you questions about your:  Alcohol use.  Tobacco use.  Drug use.  Emotional well-being.  Home and relationship well-being.  Sexual activity.  Eating habits.  History of falls.  Memory and ability to understand (cognition).  Work and work Statistician.  Reproductive health. Screening  You may have the following tests or measurements:  Height, weight, and BMI.  Blood pressure.  Lipid and cholesterol levels. These may be checked every 5 years, or more frequently if you are over 58 years old.  Skin check.  Lung cancer screening. You may have this screening every year starting at age 81 if you have a 30-pack-year history of smoking and currently smoke or have quit within the past 15 years.  Fecal occult blood test (FOBT) of the stool. You may have this test every year starting at age 55.  Flexible sigmoidoscopy or colonoscopy. You may have a sigmoidoscopy every 5 years or a colonoscopy every 10 years starting at age 14.  Hepatitis C blood test.  Hepatitis B blood test.  Sexually transmitted disease (STD) testing.  Diabetes screening. This is done by checking your blood sugar (glucose) after you have not eaten for a while (fasting). You may have this done every 1-3 years.  Bone density scan. This is done to screen for osteoporosis. You may have this done starting at age 74.  Mammogram. This may be done every 1-2 years. Talk to your health care provider about how often you should have regular mammograms. Talk with your health care provider about your test results, treatment options, and if necessary, the need for more tests. Vaccines  Your health care provider may recommend certain vaccines, such as:  Influenza vaccine.  This is recommended every year.  Tetanus, diphtheria, and acellular pertussis (Tdap, Td) vaccine. You may need a Td booster every 10 years.  Zoster vaccine. You may need this after age 84.  Pneumococcal 13-valent conjugate (PCV13)  vaccine. One dose is recommended after age 64.  Pneumococcal polysaccharide (PPSV23) vaccine. One dose is recommended after age 26. Talk to your health care provider about which screenings and vaccines you need and how often you need them. This information is not intended to replace advice given to you by your health care provider. Make sure you discuss any questions you have with your health care provider. Document Released: 05/03/2015 Document Revised: 12/25/2015 Document Reviewed: 02/05/2015 Elsevier Interactive Patient Education  2017 [place] Prevention in the Home Falls can cause injuries. They can happen to people of all ages. There are many things you can do to make your home safe and to help prevent falls. What can I do on the outside of my home?  Regularly fix the edges of walkways and driveways and fix any cracks.  Remove anything that might make you trip as you walk through a door, such as a raised step or threshold.  Trim any bushes or trees on the path to your home.  Use bright outdoor lighting.  Clear any walking paths of anything that might make someone trip, such as rocks or tools.  Regularly check to see if handrails are loose or broken. Make sure that both sides of any steps have handrails.  Any raised decks and porches should have guardrails on the edges.  Have any leaves, snow, or ice cleared regularly.  Use sand or salt on walking paths during winter.  Clean up any spills in your garage right away. This includes oil or grease spills. What can I do in the bathroom?  Use night lights.  Install grab bars by the toilet and in the tub and shower. Do not use towel bars as grab bars.  Use non-skid mats or decals in the tub or shower.  If you need to sit down in the shower, use a plastic, non-slip stool.  Keep the floor dry. Clean up any water that spills on the floor as soon as it happens.  Remove soap buildup in the tub or shower  regularly.  Attach bath mats securely with double-sided non-slip rug tape.  Do not have throw rugs and other things on the floor that can make you trip. What can I do in the bedroom?  Use night lights.  Make sure that you have a light by your bed that is easy to reach.  Do not use any sheets or blankets that are too big for your bed. They should not hang down onto the floor.  Have a firm chair that has side arms. You can use this for support while you get dressed.  Do not have throw rugs and other things on the floor that can make you trip. What can I do in the kitchen?  Clean up any spills right away.  Avoid walking on wet floors.  Keep items that you use a lot in easy-to-reach places.  If you need to reach something above you, use a strong step stool that has a grab bar.  Keep electrical cords out of the way.  Do not use floor polish or wax that makes floors slippery. If you must use wax, use non-skid floor wax.  Do not have throw rugs and other things on the floor that can make  you trip. What can I do with my stairs?  Do not leave any items on the stairs.  Make sure that there are handrails on both sides of the stairs and use them. Fix handrails that are broken or loose. Make sure that handrails are as long as the stairways.  Check any carpeting to make sure that it is firmly attached to the stairs. Fix any carpet that is loose or worn.  Avoid having throw rugs at the top or bottom of the stairs. If you do have throw rugs, attach them to the floor with carpet tape.  Make sure that you have a light switch at the top of the stairs and the bottom of the stairs. If you do not have them, ask someone to add them for you. What else can I do to help prevent falls?  Wear shoes that:  Do not have high heels.  Have rubber bottoms.  Are comfortable and fit you well.  Are closed at the toe. Do not wear sandals.  If you use a stepladder:  Make sure that it is fully  opened. Do not climb a closed stepladder.  Make sure that both sides of the stepladder are locked into place.  Ask someone to hold it for you, if possible.  Clearly mark and make sure that you can see:  Any grab bars or handrails.  First and last steps.  Where the edge of each step is.  Use tools that help you move around (mobility aids) if they are needed. These include:  Canes.  Walkers.  Scooters.  Crutches.  Turn on the lights when you go into a dark area. Replace any light bulbs as soon as they burn out.  Set up your furniture so you have a clear path. Avoid moving your furniture around.  If any of your floors are uneven, fix them.  If there are any pets around you, be aware of where they are.  Review your medicines with your doctor. Some medicines can make you feel dizzy. This can increase your chance of falling. Ask your doctor what other things that you can do to help prevent falls. This information is not intended to replace advice given to you by your health care provider. Make sure you discuss any questions you have with your health care provider. Document Released: 01/31/2009 Document Revised: 09/12/2015 Document Reviewed: 05/11/2014 Elsevier Interactive Patient Education  2017 [employer].

## 2020-05-09 NOTE — Progress Notes (Signed)
Virtual Visit via Telephone Note  I connected with  [name] Cooper on 05/09/20 at  1:45 PM EST by telephone and verified that I am speaking with the correct person using two identifiers.  Medicare Annual Wellness visit completed telephonically due to Covid-19 pandemic.   Persons participating in this call: This Health Coach and this patient.   Location: Patient: Home Provider: Office   I discussed the limitations, risks, security and privacy concerns of performing an evaluation and management service by telephone and the availability of in person appointments. The patient expressed understanding and agreed to proceed.  Unable to perform video visit due to video visit attempted and failed and/or patient does not have video capability.   Some vital signs may be absent or patient reported.   Willette Brace, LPN    Subjective:   Analyah Cooper is a 70 y.o. female who presents for Medicare Annual (Subsequent) preventive examination.  Review of Systems     Cardiac Risk Factors include: advanced age (>6men, >42 women);diabetes mellitus;obesity (BMI >30kg/m2);hypertension;dyslipidemia     Objective:    There were no vitals filed for this visit. There is no height or weight on file to calculate BMI.  Advanced Directives 05/09/2020 01/22/2020 01/08/2020 01/01/2020 12/21/2019 10/17/2019 09/14/2019  Does Patient Have a Medical Advance Directive? Yes Yes Yes No No Yes No  Type of Paramedic of [place];Living will - - - - Press photographer;Living will -  Copy of [place] in Chart? Yes - validated most recent copy scanned in chart (See row information) No - copy requested - - - Yes - validated most recent copy scanned in chart (See row information) -  Would patient like information on creating a medical advance directive? - - - No - Patient declined No - Patient declined - No - Patient declined    Current Medications  (verified) Outpatient Encounter Medications as of 05/09/2020  Medication Sig  . acetaminophen (TYLENOL) 650 MG CR tablet Take 1,300 mg by mouth every 8 (eight) hours as needed for pain. Reported on 09/23/2015  . albuterol (PROVENTIL HFA;VENTOLIN HFA) 108 (90 Base) MCG/ACT inhaler Inhale 2 puffs into the lungs every 6 (six) hours as needed for wheezing or shortness of breath.  . Ascorbic Acid (VITAMIN C) 500 MG CAPS Take 500 mg by mouth 2 (two) times daily.  Marland Kitchen aspirin EC 81 MG tablet Take 1 tablet (81 mg total) by mouth daily.  Marland Kitchen CRANBERRY EXTRACT PO Take 400 mg by mouth 2 (two) times daily. 2 tablets twice a day  . FeFum-FePoly-FA-B Cmp-C-Biot (INTEGRA PLUS) CAPS Take 1 capsule by mouth every morning.  . hydrALAZINE (APRESOLINE) 25 MG tablet Take 1 tablet (25 mg total) by mouth 3 (three) times daily.  . isosorbide mononitrate (IMDUR) 30 MG 24 hr tablet Take 1 tablet (30 mg total) by mouth daily.  Marland Kitchen linagliptin (TRADJENTA) 5 MG TABS tablet Take 1 tablet (5 mg total) by mouth daily.  . Multiple Vitamin (MULTIVITAMIN WITH MINERALS) TABS tablet Take 1 tablet by mouth daily.  Marland Kitchen triamterene-hydrochlorothiazide (MAXZIDE-25) 37.5-25 MG tablet Take 1 tablet by mouth daily.  . Vitamin D, Ergocalciferol, (DRISDOL) 1.25 MG (50000 UNIT) CAPS capsule One capsule by mouth once a week for 12 weeks. Then take 2000IU/day  . Lactobacillus (ACIDOPHILUS PO) Take 5 mg by mouth daily. (Patient not taking: Reported on 05/09/2020)  . nitroGLYCERIN (NITROSTAT) 0.4 MG SL tablet Place 1 tablet (0.4 mg total) under the tongue every 5 (five) minutes  as needed for chest pain.  . rosuvastatin (CRESTOR) 20 MG tablet Take 1 tablet (20 mg total) by mouth daily.  . [DISCONTINUED] nitrofurantoin, macrocrystal-monohydrate, (MACROBID) 100 MG capsule Take 100 mg by mouth 2 (two) times daily. (Patient not taking: Reported on 05/09/2020)   No facility-administered encounter medications on file as of 05/09/2020.    Allergies  (verified) Lisinopril and Codeine   History: Past Medical History:  Diagnosis Date  . Anemia   . Arthritis   . Cancer ([place])    breast and ovarian  . Chicken pox   . Chronic lower back pain   . COPD (chronic obstructive pulmonary disease) ([place])   . Diabetes ([place])   . GERD (gastroesophageal reflux disease)   . Hepatitis 1970's  . History of recurrent UTIs   . Hyperlipemia   . Hypertension   . Slow heart rate dx'd 09/24/2014  . Urine incontinence    Past Surgical History:  Procedure Laterality Date  . ABDOMINAL HYSTERECTOMY  1984  . APPENDECTOMY    . BREAST BIOPSY Right 1996  . BREAST LUMPECTOMY Right 1996  . LEFT HEART CATH AND CORONARY ANGIOGRAPHY N/A 01/13/2019   Procedure: LEFT HEART CATH AND CORONARY ANGIOGRAPHY;  Surgeon: Nelva Bush, MD;  Location: [facility];  Service: Cardiovascular;  Laterality: N/A;  . MASTECTOMY Right 1996  . TONSILLECTOMY     Family History  Problem Relation Age of Onset  . Cancer Mother   . Early death Mother   . Arthritis Brother   . Colon cancer Neg Hx   . Esophageal cancer Neg Hx   . Rectal cancer Neg Hx   . Stomach cancer Neg Hx    Social History   Socioeconomic History  . Marital status: Widowed    Spouse name: Not on file  . Number of children: 1  . Years of education: Not on file  . Highest education level: Not on file  Occupational History  . Occupation: Retired     Fish farm manager: [employer]  Tobacco Use  . Smoking status: Former Smoker    Packs/day: 0.75    Years: 47.00    Pack years: 35.25    Types: Cigarettes  . Smokeless tobacco: Never Used  . Tobacco comment: quit date: 01/19/2019  Vaping Use  . Vaping Use: Never used  Substance and Sexual Activity  . Alcohol use: No    Alcohol/week: 0.0 standard drinks  . Drug use: No    Types: "Crack" cocaine    Comment: 09/24/2014 "last crack was in 2013"  . Sexual activity: Not on file  Other Topics Concern  . Not on file  Social History Narrative   3  grand children    9 great grandchildren    Social Determinants of Health   Financial Resource Strain: Low Risk   . Difficulty of Paying Living Expenses: Not hard at all  Food Insecurity: No Food Insecurity  . Worried About Charity fundraiser in the Last Year: Never true  . Ran Out of Food in the Last Year: Never true  Transportation Needs: No Transportation Needs  . Lack of Transportation (Medical): No  . Lack of Transportation (Non-Medical): No  Physical Activity: Inactive  . Days of Exercise per Week: 0 days  . Minutes of Exercise per Session: 0 min  Stress: No Stress Concern Present  . Feeling of Stress : Not at all  Social Connections: Socially Isolated  . Frequency of Communication with Friends and Family: More than three times  a week  . Frequency of Social Gatherings with Friends and Family: Once a week  . Attends Religious Services: Never  . Active Member of Clubs or Organizations: No  . Attends Archivist Meetings: Never  . Marital Status: Widowed    Tobacco Counseling Counseling given: Not Answered Comment: quit date: 01/19/2019   Clinical Intake:  Pre-visit preparation completed: Yes  Pain : No/denies pain     BMI - recorded: 46.5 Nutritional Status: BMI > 30  Obese Nutritional Risks: None Diabetes: Yes CBG done?: No Did pt. bring in CBG monitor from home?: No  How often do you need to have someone help you when you read instructions, pamphlets, or other written materials from your doctor or pharmacy?: 1 - Never  Diabetic?Nutrition Risk Assessment:  Has the patient had any N/V/D within the last 2 months?  No  Does the patient have any non-healing wounds?  No  Has the patient had any unintentional weight loss or weight gain?  No   Diabetes:  Is the patient diabetic?  Yes  If diabetic, was a CBG obtained today?  No  Did the patient bring in their glucometer from home?  No  How often do you monitor your CBG's? n/a.   Financial Strains and  Diabetes Management:  Are you having any financial strains with the device, your supplies or your medication? No .  Does the patient want to be seen by Chronic Care Management for management of their diabetes?  No  Would the patient like to be referred to a Nutritionist or for Diabetic Management?  No   Diabetic Exams:  Diabetic Eye Exam: Overdue for diabetic eye exam. Pt has been advised about the importance in completing this exam. Patient advised to call and schedule an eye exam. Diabetic Foot Exam: Completed 06/20/19  Interpreter Needed?: No  Information entered by :: Charlott Rakes, LPN   Activities of Daily Living In your present state of health, do you have any difficulty performing the following activities: 05/09/2020  Hearing? N  Vision? N  Difficulty concentrating or making decisions? N  Walking or climbing stairs? N  Dressing or bathing? N  Doing errands, shopping? N  Preparing Food and eating ? N  Using the Toilet? N  In the past six months, have you accidently leaked urine? Y  Comment when sneeze or cough will have leakage  Do you have problems with loss of bowel control? N  Managing your Medications? N  Managing your Finances? N  Housekeeping or managing your Housekeeping? N  Some recent data might be hidden    Patient Care Team: [name], MD as PCP - General (Family Medicine) Jerline Pain, MD as PCP - Cardiology (Cardiology) Marzetta Board, DPM as Consulting Physician (Podiatry)  Indicate any recent Medical Services you may have received from other than [facility] providers in the past year (date may be approximate).     Assessment:   This is a routine wellness examination for [name] Cooper.  Hearing/Vision screen  Hearing Screening   125Hz  250Hz  500Hz  1000Hz  2000Hz  3000Hz  4000Hz  6000Hz  8000Hz   Right ear:           Left ear:           Comments: Denies hearing issues  Vision Screening Comments: Pt follows up with [facility] for annual  exams  Dietary issues and exercise activities discussed: Current Exercise Habits: The patient does not participate in regular exercise at present  Goals    .  Patient Stated     Lose weight      Depression Screen PHQ 2/9 Scores 05/09/2020 10/09/2019 04/25/2019 04/05/2019 09/19/2018 06/08/2018 06/07/2018  PHQ - 2 Score 0 0 0 0 0 0 0  PHQ- 9 Score - - - - 0 3 3    Fall Risk Fall Risk  05/09/2020 04/25/2019 09/19/2018 12/22/2017 06/21/2017  Falls in the past year? 0 0 0 No No  Number falls in past yr: 0 - 0 - -  Injury with Fall? 0 0 0 - -  Risk for fall due to : Impaired vision - - - -  Follow up Falls prevention discussed Falls evaluation completed;Education provided;Falls prevention discussed - - -    FALL RISK PREVENTION PERTAINING TO THE HOME:  Any stairs in or around the home? No  If so, are there any without handrails? No  Home free of loose throw rugs in walkways, pet beds, electrical cords, etc? Yes  Adequate lighting in your home to reduce risk of falls? Yes   ASSISTIVE DEVICES UTILIZED TO PREVENT FALLS:  Life alert? No  Use of a cane, walker or w/c? No  Grab bars in the bathroom? No  Shower chair or bench in shower? No  Elevated toilet seat or a handicapped toilet? No   TIMED UP AND GO:  Was the test performed? No .      Cognitive Function:     6CIT Screen 05/09/2020 04/25/2019  What Year? 0 points 0 points  What month? 0 points 0 points  What time? - 0 points  Count back from 20 0 points 0 points  Months in reverse 0 points 0 points  Repeat phrase 0 points 0 points  Total Score - 0    Immunizations Immunization History  Administered Date(s) Administered  . Fluad Quad(high Dose 65+) 02/06/2019  . Influenza,inj,Quad PF,6+ Mos 03/17/2016, 12/22/2016, 12/22/2017  . Influenza-Unspecified 03/18/2020  . PFIZER(Purple Top)SARS-COV-2 Vaccination 07/06/2019, 08/01/2019, 03/18/2020  . Pneumococcal Conjugate-13 03/17/2016  . Pneumococcal Polysaccharide-23 09/25/2014     TDAP status: Due, Education has been provided regarding the importance of this vaccine. Advised may receive this vaccine at local pharmacy or Health Dept. Aware to provide a copy of the vaccination record if obtained from local pharmacy or Health Dept. Verbalized acceptance and understanding.  Flu Vaccine status: Up to date done 03/18/20  Pneumococcal vaccine status: Due, Education has been provided regarding the importance of this vaccine. Advised may receive this vaccine at local pharmacy or Health Dept. Aware to provide a copy of the vaccination record if obtained from local pharmacy or Health Dept. Verbalized acceptance and understanding.  Covid-19 vaccine status: Completed vaccines  Qualifies for Shingles Vaccine? Yes   Zostavax completed No   Shingrix Completed?: No.    Education has been provided regarding the importance of this vaccine. Patient has been advised to call insurance company to determine out of pocket expense if they have not yet received this vaccine. Advised may also receive vaccine at local pharmacy or Health Dept. Verbalized acceptance and understanding.  Screening Tests Health Maintenance  Topic Date Due  . PNA vac Low Risk Adult (2 of 2 - PPSV23) 09/25/2019  . OPHTHALMOLOGY EXAM  02/10/2020  . HEMOGLOBIN A1C  04/09/2020  . TETANUS/TDAP  09/22/2026 (Originally 12/05/1969)  . FOOT EXAM  06/19/2020  . MAMMOGRAM  11/12/2021  . COLONOSCOPY (Pts 45-61yrs Insurance coverage will need to be confirmed)  10/25/2029  . INFLUENZA VACCINE  Completed  . DEXA SCAN  Completed  .  COVID-19 Vaccine  Completed  . Hepatitis C Screening  Completed    Health Maintenance  Health Maintenance Due  Topic Date Due  . PNA vac Low Risk Adult (2 of 2 - PPSV23) 09/25/2019  . OPHTHALMOLOGY EXAM  02/10/2020  . HEMOGLOBIN A1C  04/09/2020    Colorectal cancer screening: Type of screening: Colonoscopy. Completed 10/26/19. Repeat every 10 years  Mammogram status: Completed 11/13/19.  Repeat every year  Bone Density status: Completed 06/20/18. Results reflect: Bone density results: NORMAL. Repeat every 2 years.   Additional Screening:  Hepatitis C Screening:  Completed 09/13/15  Vision Screening: Recommended annual ophthalmology exams for early detection of glaucoma and other disorders of the eye. Is the patient up to date with their annual eye exam?  Yes  Who is the provider or what is the name of the office in which the patient attends annual eye exams? [facility]   Dental Screening: Recommended annual dental exams for proper oral hygiene  Community Resource Referral / Chronic Care Management: CRR required this visit?  No   CCM required this visit?  No      Plan:     I have personally reviewed and noted the following in the patient's chart:   . Medical and social history . Use of alcohol, tobacco or illicit drugs  . Current medications and supplements . Functional ability and status . Nutritional status . Physical activity . Advanced directives . List of other physicians . Hospitalizations, surgeries, and ER visits in previous 12 months . Vitals . Screenings to include cognitive, depression, and falls . Referrals and appointments  In addition, I have reviewed and discussed with patient certain preventive protocols, quality metrics, and best practice recommendations. A written personalized care plan for preventive services as well as general preventive health recommendations were provided to patient.     Willette Brace, LPN   [id number]   Nurse Notes: None

## 2020-05-14 ENCOUNTER — Other Ambulatory Visit: Payer: Self-pay

## 2020-05-14 ENCOUNTER — Other Ambulatory Visit: Payer: Self-pay | Admitting: Internal Medicine

## 2020-05-14 ENCOUNTER — Inpatient Hospital Stay (HOSPITAL_BASED_OUTPATIENT_CLINIC_OR_DEPARTMENT_OTHER): Payer: 59 | Admitting: Internal Medicine

## 2020-05-14 ENCOUNTER — Inpatient Hospital Stay: Payer: 59 | Attending: Physician Assistant

## 2020-05-14 VITALS — BP 137/72 | HR 68 | Temp 98.2°F | Resp 18 | Ht 66.0 in | Wt 293.3 lb

## 2020-05-14 DIAGNOSIS — D509 Iron deficiency anemia, unspecified: Secondary | ICD-10-CM | POA: Diagnosis present

## 2020-05-14 DIAGNOSIS — D508 Other iron deficiency anemias: Secondary | ICD-10-CM

## 2020-05-14 LAB — CBC WITH DIFFERENTIAL (CANCER CENTER ONLY)
Abs Immature Granulocytes: 0.03 10*3/uL (ref 0.00–0.07)
Basophils Absolute: 0.1 10*3/uL (ref 0.0–0.1)
Basophils Relative: 1 %
Eosinophils Absolute: 0.6 10*3/uL — ABNORMAL HIGH (ref 0.0–0.5)
Eosinophils Relative: 7 %
HCT: 35.2 % — ABNORMAL LOW (ref 36.0–46.0)
Hemoglobin: 10.6 g/dL — ABNORMAL LOW (ref 12.0–15.0)
Immature Granulocytes: 0 %
Lymphocytes Relative: 38 %
Lymphs Abs: 3.5 10*3/uL (ref 0.7–4.0)
MCH: 21.8 pg — ABNORMAL LOW (ref 26.0–34.0)
MCHC: 30.1 g/dL (ref 30.0–36.0)
MCV: 72.4 fL — ABNORMAL LOW (ref 80.0–100.0)
Monocytes Absolute: 0.8 10*3/uL (ref 0.1–1.0)
Monocytes Relative: 9 %
Neutro Abs: 4.3 10*3/uL (ref 1.7–7.7)
Neutrophils Relative %: 45 %
Platelet Count: 256 10*3/uL (ref 150–400)
RBC: 4.86 MIL/uL (ref 3.87–5.11)
RDW: 16.3 % — ABNORMAL HIGH (ref 11.5–15.5)
WBC Count: 9.3 10*3/uL (ref 4.0–10.5)
nRBC: 0 % (ref 0.0–0.2)

## 2020-05-14 LAB — IRON AND TIBC
Iron: 29 ug/dL — ABNORMAL LOW (ref 41–142)
Saturation Ratios: 10 % — ABNORMAL LOW (ref 21–57)
TIBC: 305 ug/dL (ref 236–444)
UIBC: 276 ug/dL (ref 120–384)

## 2020-05-14 LAB — FERRITIN: Ferritin: 27 ng/mL (ref 11–307)

## 2020-05-14 NOTE — Progress Notes (Signed)
[place] [phone]   [phone]  OFFICE PROGRESS NOTE  [name] Flaming, MD [place] [id number]  DIAGNOSIS: Iron deficiency anemia  PRIOR THERAPY: None  CURRENT THERAPY:  1) IV iron PRN with venofer. Last given on 01/22/20 2) Oral iron supplement with integra plus p.o. daily.   INTERVAL HISTORY: [name] Cooper 70 y.o. female returns to the clinic today for follow-up visit accompanied by her brother.  The patient is feeling fine today with no concerning complaints.  She denied having any current fatigue or weakness.  She has no chest pain, shortness of breath, cough or hemoptysis.  She denied having any fever or chills.  She has no nausea, vomiting, diarrhea or constipation.  She denied having any headache or visual changes.  The patient is here today for evaluation and repeat CBC, iron study and ferritin.  She continues to tolerate her oral iron tablets fairly well except for constipation improved with MiraLAX.  MEDICAL HISTORY: Past Medical History:  Diagnosis Date  . Anemia   . Arthritis   . Cancer ([place])    breast and ovarian  . Chicken pox   . Chronic lower back pain   . COPD (chronic obstructive pulmonary disease) ([place])   . Diabetes ([place])   . GERD (gastroesophageal reflux disease)   . Hepatitis 1970's  . History of recurrent UTIs   . Hyperlipemia   . Hypertension   . Slow heart rate dx'd 09/24/2014  . Urine incontinence     ALLERGIES:  is allergic to lisinopril and codeine.  MEDICATIONS:  Current Outpatient Medications  Medication Sig Dispense Refill  . acetaminophen (TYLENOL) 650 MG CR tablet Take 1,300 mg by mouth every 8 (eight) hours as needed for pain. Reported on 09/23/2015    . albuterol (PROVENTIL HFA;VENTOLIN HFA) 108 (90 Base) MCG/ACT inhaler Inhale 2 puffs into the lungs every 6 (six) hours as needed for wheezing or shortness of breath. 1 Inhaler 3  . Ascorbic Acid (VITAMIN C) 500 MG CAPS  Take 500 mg by mouth 2 (two) times daily.    Marland Kitchen aspirin EC 81 MG tablet Take 1 tablet (81 mg total) by mouth daily. 90 tablet 3  . CRANBERRY EXTRACT PO Take 400 mg by mouth 2 (two) times daily. 2 tablets twice a day    . FeFum-FePoly-FA-B Cmp-C-Biot (INTEGRA PLUS) CAPS Take 1 capsule by mouth every morning. 30 capsule 2  . hydrALAZINE (APRESOLINE) 25 MG tablet Take 1 tablet (25 mg total) by mouth 3 (three) times daily. 270 tablet 2  . isosorbide mononitrate (IMDUR) 30 MG 24 hr tablet Take 1 tablet (30 mg total) by mouth daily. 90 tablet 2  . Lactobacillus (ACIDOPHILUS PO) Take 5 mg by mouth daily. (Patient not taking: Reported on 05/09/2020)    . linagliptin (TRADJENTA) 5 MG TABS tablet Take 1 tablet (5 mg total) by mouth daily. 90 tablet 3  . Multiple Vitamin (MULTIVITAMIN WITH MINERALS) TABS tablet Take 1 tablet by mouth daily.    . nitroGLYCERIN (NITROSTAT) 0.4 MG SL tablet Place 1 tablet (0.4 mg total) under the tongue every 5 (five) minutes as needed for chest pain. 25 tablet prn  . rosuvastatin (CRESTOR) 20 MG tablet Take 1 tablet (20 mg total) by mouth daily. 90 tablet 3  . triamterene-hydrochlorothiazide (MAXZIDE-25) 37.5-25 MG tablet Take 1 tablet by mouth daily. 90 tablet 1  . Vitamin D, Ergocalciferol, (DRISDOL) 1.25 MG (50000 UNIT) CAPS capsule One capsule by  mouth once a week for 12 weeks. Then take 2000IU/day 12 capsule 0   No current facility-administered medications for this visit.    SURGICAL HISTORY:  Past Surgical History:  Procedure Laterality Date  . ABDOMINAL HYSTERECTOMY  1984  . APPENDECTOMY    . BREAST BIOPSY Right 1996  . BREAST LUMPECTOMY Right 1996  . LEFT HEART CATH AND CORONARY ANGIOGRAPHY N/A 01/13/2019   Procedure: LEFT HEART CATH AND CORONARY ANGIOGRAPHY;  Surgeon: Nelva Bush, MD;  Location: [facility];  Service: Cardiovascular;  Laterality: N/A;  . MASTECTOMY Right 1996  . TONSILLECTOMY      REVIEW OF SYSTEMS:  A comprehensive review of  systems was negative except for: Gastrointestinal: positive for constipation   PHYSICAL EXAMINATION: General appearance: alert, cooperative and no distress Head: Normocephalic, without obvious abnormality, atraumatic Neck: no adenopathy, no JVD, supple, symmetrical, trachea midline and thyroid not enlarged, symmetric, no tenderness/mass/nodules Lymph nodes: Cervical, supraclavicular, and axillary nodes normal. Resp: clear to auscultation bilaterally Back: symmetric, no curvature. ROM normal. No CVA tenderness. Cardio: regular rate and rhythm, S1, S2 normal, no murmur, click, rub or gallop GI: soft, non-tender; bowel sounds normal; no masses,  no organomegaly Extremities: extremities normal, atraumatic, no cyanosis or edema  ECOG PERFORMANCE STATUS: 1 - Symptomatic but completely ambulatory  Blood pressure 137/72, pulse 68, temperature 98.2 F (36.8 C), temperature source Tympanic, resp. rate 18, height 5\' 6"  (1.676 m), weight 293 lb 4.8 oz (133 kg), SpO2 98 %.  LABORATORY DATA: Lab Results  Component Value Date   WBC 9.3 05/14/2020   HGB 10.6 (L) 05/14/2020   HCT 35.2 (L) 05/14/2020   MCV 72.4 (L) 05/14/2020   PLT 256 05/14/2020      Chemistry      Component Value Date/Time   NA 140 12/21/2019 1336   NA 140 01/30/2019 1330   K 3.4 (L) 12/21/2019 1336   CL 101 12/21/2019 1336   CO2 31 12/21/2019 1336   BUN 15 12/21/2019 1336   BUN 18 01/30/2019 1330   CREATININE 1.20 (H) 12/21/2019 1336   CREATININE 1.28 (H) 09/21/2016 1115      Component Value Date/Time   CALCIUM 9.4 12/21/2019 1336   ALKPHOS 98 12/21/2019 1336   AST 12 (L) 12/21/2019 1336   ALT 8 12/21/2019 1336   BILITOT 0.4 12/21/2019 1336       RADIOGRAPHIC STUDIES: No results found.  ASSESSMENT AND PLAN: This is a very pleasant 70 years old African-American female with iron deficiency anemia status post Venofer infusion in October 2021.  The patient is also currently on treatment with oral Integra +1 capsule  p.o. daily and has been tolerating this treatment well. Repeat CBC today showed mild anemia with hemoglobin of 10.6.  The iron study and ferritin are still pending. I recommended for the patient to continue with the oral iron tablets for now and if the pending iron study showed severe iron deficiency, will consider the patient for iron infusion again with Venofer. I will see her back for follow-up visit in 3 months for evaluation and repeat blood work. She was advised to call immediately if she has any other concerning symptoms in the interval. The patient voices understanding of current disease status and treatment options and is in agreement with the current care plan.  All questions were answered. The patient knows to call the clinic with any problems, questions or concerns. We can certainly see the patient much sooner if necessary.  Disclaimer: This note was  dictated with voice recognition software. Similar sounding words can inadvertently be transcribed and may not be corrected upon review.       

## 2020-05-15 ENCOUNTER — Telehealth: Payer: Self-pay | Admitting: Internal Medicine

## 2020-05-15 NOTE — Telephone Encounter (Signed)
Scheduled appt per 1/25 sch msg - pt is aware of appt date and time   

## 2020-05-15 NOTE — Telephone Encounter (Signed)
Scheduled appointments per 1/25 los. Called patient, no answer. Left message with appointments date and times.

## 2020-05-17 ENCOUNTER — Inpatient Hospital Stay: Payer: 59

## 2020-05-17 ENCOUNTER — Other Ambulatory Visit: Payer: Self-pay

## 2020-05-17 VITALS — BP 125/66 | HR 65 | Temp 98.7°F | Resp 18

## 2020-05-17 DIAGNOSIS — D509 Iron deficiency anemia, unspecified: Secondary | ICD-10-CM | POA: Diagnosis not present

## 2020-05-17 MED ORDER — DIPHENHYDRAMINE HCL 25 MG PO CAPS
50.0000 mg | ORAL_CAPSULE | Freq: Once | ORAL | Status: AC
  Administered 2020-05-17: 50 mg via ORAL

## 2020-05-17 MED ORDER — ACETAMINOPHEN 325 MG PO TABS
650.0000 mg | ORAL_TABLET | Freq: Once | ORAL | Status: AC
  Administered 2020-05-17: 650 mg via ORAL

## 2020-05-17 MED ORDER — SODIUM CHLORIDE 0.9 % IV SOLN
200.0000 mg | Freq: Once | INTRAVENOUS | Status: AC
  Administered 2020-05-17: 200 mg via INTRAVENOUS
  Filled 2020-05-17: qty 200

## 2020-05-17 MED ORDER — DIPHENHYDRAMINE HCL 25 MG PO CAPS
ORAL_CAPSULE | ORAL | Status: AC
  Filled 2020-05-17: qty 2

## 2020-05-17 MED ORDER — ACETAMINOPHEN 325 MG PO TABS
ORAL_TABLET | ORAL | Status: AC
  Filled 2020-05-17: qty 2

## 2020-05-17 MED ORDER — SODIUM CHLORIDE 0.9 % IV SOLN
Freq: Once | INTRAVENOUS | Status: AC
  Filled 2020-05-17: qty 250

## 2020-05-17 NOTE — Patient Instructions (Signed)
Iron Dextran injection °What is this medicine? °IRON DEXTRAN (AHY ern DEX tran) is an iron complex. Iron is used to make healthy red blood cells, which carry oxygen and nutrients through the body. This medicine is used to treat people who cannot take iron by mouth and have low levels of iron in the blood. °This medicine may be used for other purposes; ask your health care provider or pharmacist if you have questions. °COMMON BRAND NAME(S): Dexferrum, INFeD °What should I tell my health care provider before I take this medicine? °They need to know if you have any of these conditions: °· anemia not caused by low iron levels °· heart disease °· high levels of iron in the blood °· kidney disease °· liver disease °· an unusual or allergic reaction to iron, other medicines, foods, dyes, or preservatives °· pregnant or trying to get pregnant °· breast-feeding °How should I use this medicine? °This medicine is for injection into a vein or a muscle. It is given by a health care professional in a hospital or clinic setting. °Talk to your pediatrician regarding the use of this medicine in children. While this drug may be prescribed for children as young as 4 months old for selected conditions, precautions do apply. °Overdosage: If you think you have taken too much of this medicine contact a poison control center or emergency room at once. °NOTE: This medicine is only for you. Do not share this medicine with others. °What if I miss a dose? °It is important not to miss your dose. Call your doctor or health care professional if you are unable to keep an appointment. °What may interact with this medicine? °Do not take this medicine with any of the following medications: °· deferoxamine °· dimercaprol °· other iron products °This medicine may also interact with the following medications: °· chloramphenicol °· deferasirox °This list may not describe all possible interactions. Give your health care provider a list of all the  medicines, herbs, non-prescription drugs, or dietary supplements you use. Also tell them if you smoke, drink alcohol, or use illegal drugs. Some items may interact with your medicine. °What should I watch for while using this medicine? °Visit your doctor or health care professional regularly. Tell your doctor if your symptoms do not start to get better or if they get worse. You may need blood work done while you are taking this medicine. °You may need to follow a special diet. Talk to your doctor. Foods that contain iron include: whole grains/cereals, dried fruits, beans, or peas, leafy green vegetables, and organ meats (liver, kidney). °Long-term use of this medicine may increase your risk of some cancers. Talk to your doctor about how to limit your risk. °What side effects may I notice from receiving this medicine? °Side effects that you should report to your doctor or health care professional as soon as possible: °· allergic reactions like skin rash, itching or hives, swelling of the face, lips, or tongue °· blue lips, nails, or skin °· breathing problems °· changes in blood pressure °· chest pain °· confusion °· fast, irregular heartbeat °· feeling faint or lightheaded, falls °· fever or chills °· flushing, sweating, or hot feelings °· joint or muscle aches or pains °· pain, tingling, numbness in the hands or feet °· seizures °· unusually weak or tired °Side effects that usually do not require medical attention (report to your doctor or health care professional if they continue or are bothersome): °· change in taste (metallic taste) °·   diarrhea °· headache °· irritation at site where injected °· nausea, vomiting °· stomach upset °This list may not describe all possible side effects. Call your doctor for medical advice about side effects. You may report side effects to FDA at 1-800-FDA-1088. °Where should I keep my medicine? °This drug is given in a hospital or clinic and will not be stored at home. °NOTE: This  sheet is a summary. It may not cover all possible information. If you have questions about this medicine, talk to your doctor, pharmacist, or health care provider. °© 2021 Elsevier/Gold Standard (2007-08-23 16:59:50) ° °

## 2020-05-24 ENCOUNTER — Inpatient Hospital Stay: Payer: 59 | Attending: Physician Assistant

## 2020-05-24 ENCOUNTER — Other Ambulatory Visit: Payer: Self-pay

## 2020-05-24 VITALS — BP 109/64 | HR 75 | Temp 98.4°F | Resp 24

## 2020-05-24 DIAGNOSIS — D509 Iron deficiency anemia, unspecified: Secondary | ICD-10-CM | POA: Insufficient documentation

## 2020-05-24 MED ORDER — DIPHENHYDRAMINE HCL 25 MG PO CAPS
ORAL_CAPSULE | ORAL | Status: AC
  Filled 2020-05-24: qty 2

## 2020-05-24 MED ORDER — ACETAMINOPHEN 325 MG PO TABS
650.0000 mg | ORAL_TABLET | Freq: Once | ORAL | Status: AC
  Administered 2020-05-24: 650 mg via ORAL

## 2020-05-24 MED ORDER — DIPHENHYDRAMINE HCL 25 MG PO CAPS
50.0000 mg | ORAL_CAPSULE | Freq: Once | ORAL | Status: AC
  Administered 2020-05-24: 50 mg via ORAL

## 2020-05-24 MED ORDER — SODIUM CHLORIDE 0.9 % IV SOLN
Freq: Once | INTRAVENOUS | Status: AC
  Filled 2020-05-24: qty 250

## 2020-05-24 MED ORDER — SODIUM CHLORIDE 0.9 % IV SOLN
200.0000 mg | Freq: Once | INTRAVENOUS | Status: AC
  Administered 2020-05-24: 200 mg via INTRAVENOUS
  Filled 2020-05-24: qty 200

## 2020-05-24 MED ORDER — ACETAMINOPHEN 325 MG PO TABS
ORAL_TABLET | ORAL | Status: AC
  Filled 2020-05-24: qty 2

## 2020-05-24 NOTE — Progress Notes (Signed)
Pt. declined to stay for 30 minute post observation. States she has tolerated treatment without any issues. Pt. stable for discharge. Left via ambulation, no respiratory distress noted.

## 2020-05-24 NOTE — Patient Instructions (Signed)

## 2020-05-31 ENCOUNTER — Inpatient Hospital Stay: Payer: 59

## 2020-05-31 ENCOUNTER — Other Ambulatory Visit: Payer: Self-pay

## 2020-05-31 VITALS — BP 118/63 | HR 65 | Temp 98.0°F | Resp 24

## 2020-05-31 DIAGNOSIS — D509 Iron deficiency anemia, unspecified: Secondary | ICD-10-CM

## 2020-05-31 MED ORDER — DIPHENHYDRAMINE HCL 25 MG PO CAPS
ORAL_CAPSULE | ORAL | Status: AC
  Filled 2020-05-31: qty 2

## 2020-05-31 MED ORDER — SODIUM CHLORIDE 0.9 % IV SOLN
200.0000 mg | Freq: Once | INTRAVENOUS | Status: AC
  Administered 2020-05-31: 200 mg via INTRAVENOUS
  Filled 2020-05-31: qty 200

## 2020-05-31 MED ORDER — SODIUM CHLORIDE 0.9 % IV SOLN
Freq: Once | INTRAVENOUS | Status: AC
  Filled 2020-05-31: qty 250

## 2020-05-31 MED ORDER — DIPHENHYDRAMINE HCL 25 MG PO CAPS
50.0000 mg | ORAL_CAPSULE | Freq: Once | ORAL | Status: AC
  Administered 2020-05-31: 50 mg via ORAL

## 2020-05-31 MED ORDER — ACETAMINOPHEN 325 MG PO TABS
650.0000 mg | ORAL_TABLET | Freq: Once | ORAL | Status: AC
  Administered 2020-05-31: 650 mg via ORAL

## 2020-05-31 MED ORDER — ACETAMINOPHEN 325 MG PO TABS
ORAL_TABLET | ORAL | Status: AC
  Filled 2020-05-31: qty 2

## 2020-05-31 NOTE — Patient Instructions (Signed)

## 2020-05-31 NOTE — Progress Notes (Signed)
Pt. declines to stay for 30 minute post observation. States she has tolerated treatment without any issues. ?

## 2020-06-07 ENCOUNTER — Other Ambulatory Visit: Payer: Self-pay

## 2020-06-07 ENCOUNTER — Inpatient Hospital Stay: Payer: 59

## 2020-06-07 VITALS — BP 122/75 | HR 68 | Temp 98.5°F | Resp 24

## 2020-06-07 DIAGNOSIS — D509 Iron deficiency anemia, unspecified: Secondary | ICD-10-CM

## 2020-06-07 MED ORDER — ACETAMINOPHEN 325 MG PO TABS
650.0000 mg | ORAL_TABLET | Freq: Once | ORAL | Status: AC
  Administered 2020-06-07: 650 mg via ORAL

## 2020-06-07 MED ORDER — DIPHENHYDRAMINE HCL 25 MG PO CAPS
50.0000 mg | ORAL_CAPSULE | Freq: Once | ORAL | Status: AC
  Administered 2020-06-07: 50 mg via ORAL

## 2020-06-07 MED ORDER — ACETAMINOPHEN 325 MG PO TABS
ORAL_TABLET | ORAL | Status: AC
  Filled 2020-06-07: qty 2

## 2020-06-07 MED ORDER — DIPHENHYDRAMINE HCL 25 MG PO CAPS
ORAL_CAPSULE | ORAL | Status: AC
  Filled 2020-06-07: qty 2

## 2020-06-07 MED ORDER — SODIUM CHLORIDE 0.9 % IV SOLN
Freq: Once | INTRAVENOUS | Status: AC
  Filled 2020-06-07: qty 250

## 2020-06-07 MED ORDER — SODIUM CHLORIDE 0.9 % IV SOLN
200.0000 mg | Freq: Once | INTRAVENOUS | Status: AC
  Administered 2020-06-07: 200 mg via INTRAVENOUS
  Filled 2020-06-07: qty 200

## 2020-06-07 NOTE — Progress Notes (Signed)
Pt. declined to stay for 30 minute post observation. States she has been tolerating treatment without any issues.

## 2020-06-07 NOTE — Patient Instructions (Signed)

## 2020-06-18 ENCOUNTER — Telehealth: Payer: Self-pay

## 2020-06-18 MED ORDER — TRIAMTERENE-HCTZ 37.5-25 MG PO TABS: 1.0000 | ORAL_TABLET | Freq: Every day | ORAL | 1 refills | Status: DC

## 2020-06-18 NOTE — Addendum Note (Signed)
Addended by: Clyde Lundborg A on: 06/18/2020 11:18 AM   Modules accepted: Orders

## 2020-06-18 NOTE — Telephone Encounter (Signed)
.   LAST APPOINTMENT DATE: 05/09/2020   NEXT APPOINTMENT DATE:@3 /21/2022  MEDICATION:triamterene-hydrochlorothiazide (VACQPEA-83) 37.5-25 MG tablet   [place] ([place]), [place] - [address]    CLINICAL FILLS OUT ALL BELOW:   LAST REFILL:  QTY:  REFILL DATE:    OTHER COMMENTS:    Okay for refill?  Please advise

## 2020-06-18 NOTE — Telephone Encounter (Signed)
Refill sent.

## 2020-07-03 ENCOUNTER — Other Ambulatory Visit: Payer: Self-pay

## 2020-07-03 ENCOUNTER — Ambulatory Visit (INDEPENDENT_AMBULATORY_CARE_PROVIDER_SITE_OTHER): Payer: 59 | Admitting: Podiatry

## 2020-07-03 DIAGNOSIS — M79675 Pain in left toe(s): Secondary | ICD-10-CM | POA: Diagnosis not present

## 2020-07-03 DIAGNOSIS — E119 Type 2 diabetes mellitus without complications: Secondary | ICD-10-CM | POA: Diagnosis not present

## 2020-07-03 DIAGNOSIS — B351 Tinea unguium: Secondary | ICD-10-CM

## 2020-07-03 DIAGNOSIS — M79674 Pain in right toe(s): Secondary | ICD-10-CM

## 2020-07-08 ENCOUNTER — Encounter: Payer: Self-pay | Admitting: Family Medicine

## 2020-07-08 ENCOUNTER — Ambulatory Visit (INDEPENDENT_AMBULATORY_CARE_PROVIDER_SITE_OTHER): Payer: 59 | Admitting: Family Medicine

## 2020-07-08 ENCOUNTER — Other Ambulatory Visit: Payer: Self-pay

## 2020-07-08 VITALS — BP 121/79 | HR 66 | Temp 97.6°F | Ht 66.0 in | Wt 299.4 lb

## 2020-07-08 DIAGNOSIS — E782 Mixed hyperlipidemia: Secondary | ICD-10-CM

## 2020-07-08 DIAGNOSIS — E559 Vitamin D deficiency, unspecified: Secondary | ICD-10-CM

## 2020-07-08 DIAGNOSIS — M545 Low back pain, unspecified: Secondary | ICD-10-CM

## 2020-07-08 DIAGNOSIS — E119 Type 2 diabetes mellitus without complications: Secondary | ICD-10-CM

## 2020-07-08 LAB — LIPID PANEL
Cholesterol: 163 mg/dL (ref 0–200)
HDL: 62.5 mg/dL (ref >39.00)
LDL Cholesterol: 80 mg/dL (ref 0–99)
NonHDL: 100.42
Total CHOL/HDL Ratio: 3
Triglycerides: 101 mg/dL (ref 0.0–149.0)
VLDL: 20.2 mg/dL (ref 0.0–40.0)

## 2020-07-08 LAB — MICROALBUMIN / CREATININE URINE RATIO
Creatinine,U: 98.5 mg/dL
Microalb Creat Ratio: 1.2 mg/g (ref 0.0–30.0)
Microalb, Ur: 1.2 mg/dL (ref 0.0–1.9)

## 2020-07-08 LAB — COMPREHENSIVE METABOLIC PANEL
ALT: 9 U/L (ref 0–35)
AST: 13 U/L (ref 0–37)
Albumin: 4.2 g/dL (ref 3.5–5.2)
Alkaline Phosphatase: 88 U/L (ref 39–117)
BUN: 18 mg/dL (ref 6–23)
CO2: 31 mEq/L (ref 19–32)
Calcium: 9.5 mg/dL (ref 8.4–10.5)
Chloride: 101 mEq/L (ref 96–112)
Creatinine, Ser: 1.2 mg/dL (ref 0.40–1.20)
GFR: 45.84 mL/min — ABNORMAL LOW (ref >60.00)
Glucose, Bld: 83 mg/dL (ref 70–99)
Potassium: 3.7 mEq/L (ref 3.5–5.1)
Sodium: 141 mEq/L (ref 135–145)
Total Bilirubin: 0.4 mg/dL (ref 0.2–1.2)
Total Protein: 7.4 g/dL (ref 6.0–8.3)

## 2020-07-08 LAB — HEMOGLOBIN A1C: Hgb A1c MFr Bld: 6.5 % (ref 4.6–6.5)

## 2020-07-08 LAB — TSH: TSH: 2.44 u[IU]/mL (ref 0.35–4.50)

## 2020-07-08 LAB — VITAMIN D 25 HYDROXY (VIT D DEFICIENCY, FRACTURES): VITD: 29.19 ng/mL — ABNORMAL LOW (ref 30.00–100.00)

## 2020-07-08 MED ORDER — ALBUTEROL SULFATE HFA 108 (90 BASE) MCG/ACT IN AERS: 2.0000 | INHALATION_SPRAY | Freq: Four times a day (QID) | RESPIRATORY_TRACT | 3 refills | Status: AC | PRN

## 2020-07-08 MED ORDER — NITROGLYCERIN 0.4 MG SL SUBL: 0.4000 mg | SUBLINGUAL_TABLET | SUBLINGUAL | 99 refills | Status: DC | PRN

## 2020-07-08 MED ORDER — TRAMADOL HCL 50 MG PO TABS: 50.0000 mg | ORAL_TABLET | Freq: Two times a day (BID) | ORAL | 0 refills | Status: AC | PRN

## 2020-07-08 NOTE — Patient Instructions (Signed)
1) routine labs today  For your back 1) getting xray 2) sending to PT 3) pain pill sent in called tramadol. Use for severe pain. Tylenol can continue would try heating pad as well.  4) if pain getting worse or starting to cause leg weakness, tingling, numbness, I need to know asap so we can order a MRI of your back.

## 2020-07-08 NOTE — Progress Notes (Signed)
Patient: [name] Cooper MRN: [id number] DOB: May 11, 1950 PCP: [name], MD     Subjective:  Chief Complaint  Patient presents with  . Diabetes  . Back Pain    Pt recently had a fall.     HPI: The patient is a 70 y.o. female who presents today for DM.  Diabetes:  diagnosed from prediabetes to diabetes last December (03/2019). Stopped her metformin due to increased creatinine and started her on tradjenta. Her a1c was 7.1. she has had no issues with the new medication. She has gained 9 pounds since her last visit. She is trying to follow a diabetic diet. She had a medicare visit at home and her a1c was 6.3. she has not been seen since 09/2019.   Back pain s/p fall She states she fell about 3 weeks ago and she was at her daughters. She put a pan on the stove and it had plastic on the bottom and it smoked up everything and she got dizzy and fell onto her bottom. She was fine for 2 weeks then about 4 days ago her back started to hurt her. She hasn't done anything unusual. She did not lift anything heavy. Pain is all the way across her lower back. Pain rated as a 10/10. Pain is sharp in nature. If she is sitting down she is okay. If she lays on bed, gets up or tries to turn over she has bad pain. It hurts just a little bit to walk. She has tried tylenol, but it has not done anything. She has no numbness, burning or tingling down her legs. She has no weakness in her legs. She has no urinary incontinence.   CAD Followed by cardiology. On statin, imdur, ASA. Denies any chest pain. Needs a refill of her nitrostat. It has expired. She has not needed or used this.   Review of Systems  Constitutional: Negative for chills, fatigue and fever.  HENT: Negative for dental problem, ear pain, hearing loss and trouble swallowing.   Eyes: Negative for visual disturbance.  Respiratory: Negative for cough, chest tightness and shortness of breath.   Cardiovascular: Negative for chest pain, palpitations and  leg swelling.  Gastrointestinal: Negative for abdominal pain, blood in stool, diarrhea and nausea.  Endocrine: Negative for cold intolerance, polydipsia, polyphagia and polyuria.  Genitourinary: Negative for dysuria and hematuria.  Musculoskeletal: Positive for back pain. Negative for arthralgias, gait problem, neck pain and neck stiffness.  Skin: Negative for rash.  Neurological: Negative for dizziness and headaches.  Psychiatric/Behavioral: Negative for dysphoric mood and sleep disturbance. The patient is not nervous/anxious.     Allergies Patient is allergic to lisinopril and codeine.  Past Medical History Patient  has a past medical history of Anemia, Arthritis, Cancer ([place]), Chicken pox, Chronic lower back pain, COPD (chronic obstructive pulmonary disease) ([place]), Diabetes ([place]), GERD (gastroesophageal reflux disease), Hepatitis (1970's), History of recurrent UTIs, Hyperlipemia, Hypertension, Slow heart rate (dx'd 09/24/2014), and Urine incontinence.  Surgical History Patient  has a past surgical history that includes Tonsillectomy; Appendectomy; Abdominal hysterectomy (1984); Mastectomy (Right, 1996); Breast biopsy (Right, 1996); Breast lumpectomy (Right, 1996); and LEFT HEART CATH AND CORONARY ANGIOGRAPHY (N/A, 01/13/2019).  Family History Pateint's family history includes Arthritis in her brother; Cancer in her mother; Early death in her mother.  Social History Patient  reports that she has quit smoking. Her smoking use included cigarettes. She has a 35.25 pack-year smoking history. She has never used smokeless tobacco. She reports that she does not drink alcohol and  does not use drugs.    Objective: Vitals:   07/08/20 1317  BP: 121/79  Pulse: 66  Temp: 97.6 F (36.4 C)  TempSrc: Temporal  SpO2: 96%  Weight: 299 lb 6.4 oz (135.8 kg)  Height: 5\' 6"  (1.676 m)    Body mass index is 48.32 kg/m.  Physical Exam Vitals reviewed.  Constitutional:      Appearance: Normal  appearance. She is well-developed. She is obese.     Comments: Tobacco odor  HENT:     Head: Normocephalic and atraumatic.     Right Ear: Tympanic membrane, ear canal and external ear normal.     Left Ear: Tympanic membrane, ear canal and external ear normal.  Eyes:     Conjunctiva/sclera: Conjunctivae normal.     Pupils: Pupils are equal, round, and reactive to light.  Neck:     Thyroid: No thyromegaly.     Vascular: No carotid bruit.  Cardiovascular:     Rate and Rhythm: Normal rate and regular rhythm.     Heart sounds: Normal heart sounds. No murmur heard.   Pulmonary:     Effort: Pulmonary effort is normal.     Breath sounds: Normal breath sounds.  Abdominal:     General: Bowel sounds are normal. There is no distension.     Palpations: Abdomen is soft.     Tenderness: There is no abdominal tenderness.  Musculoskeletal:        General: Tenderness (TTP all along lower back) present.     Cervical back: Normal range of motion and neck supple.     Comments: Negative straight leg test on the right and positive on the left.   Lymphadenopathy:     Cervical: No cervical adenopathy.  Skin:    General: Skin is warm and dry.     Findings: No rash.  Neurological:     General: No focal deficit present.     Mental Status: She is alert and oriented to person, place, and time.     Cranial Nerves: No cranial nerve deficit.     Motor: No weakness.     Coordination: Coordination normal.     Deep Tendon Reflexes: Reflexes normal.  Psychiatric:        Behavior: Behavior normal.    Xray: will come back for lumbar xray.      Flowsheet Row Clinical Support from 05/09/2020 in [facility]  PHQ-2 Total Score 0      Assessment/plan: 1. Diabetes mellitus without complication ([place]) Has been near to goal on tradjenta. Discussed she needs to be seen q 6 months. Will check routine labs today.  -on statin -encouraged more weight loss and diabetic diet.  -eye  exam -f/u in 6 months.  - Comprehensive metabolic panel - Hemoglobin A1c - TSH - Microalbumin / creatinine urine ratio  2. Mixed hyperlipidemia  - Lipid panel  3. Vitamin D deficiency  - VITAMIN D 25 Hydroxy (Vit-D Deficiency, Fractures)  4. Acute bilateral low back pain without sciatica Need xray and she will come back for this tomorrow when we have available. Discussed she did have a + test for possible herniated disc. Sending to PT to help with low back pain, checking xray and sending in some tramadol for severe pain since her pain is so bad. ER precautions given for weakness, foot drop, urinary incontinence. Will ger her xray tomorrow, start PT. IF any worsening symptoms have low threshold to MRI her.  - DG Lumbar Spine Complete; Future -  Ambulatory referral to Physical Therapy     This visit occurred during the SARS-CoV-2 public health emergency.  Safety protocols were in place, including screening questions prior to the visit, additional usage of staff PPE, and extensive cleaning of exam room while observing appropriate contact time as indicated for disinfecting solutions.     Return in about 6 months (around 01/08/2021) for diabetes/htn .   [name], MD [place]   07/08/2020

## 2020-07-09 ENCOUNTER — Other Ambulatory Visit: Payer: 59

## 2020-07-10 ENCOUNTER — Ambulatory Visit (INDEPENDENT_AMBULATORY_CARE_PROVIDER_SITE_OTHER): Payer: 59

## 2020-07-10 ENCOUNTER — Other Ambulatory Visit: Payer: Self-pay

## 2020-07-10 ENCOUNTER — Other Ambulatory Visit: Payer: 59

## 2020-07-10 DIAGNOSIS — M545 Low back pain, unspecified: Secondary | ICD-10-CM | POA: Diagnosis not present

## 2020-07-11 ENCOUNTER — Encounter: Payer: Self-pay | Admitting: Podiatry

## 2020-07-11 NOTE — Progress Notes (Signed)
Subjective: [name] Cooper is a pleasant 70 y.o. female patient seen today preventative diabetic foot care and painful mycotic nails b/l that are difficult to trim. Pain interferes with ambulation. Aggravating factors include wearing enclosed shoe gear. Pain is relieved with periodic professional debridement.   Patient does not check her blood glucose daily. Her last A1c was 6.3%.   PCP is Dr. [name] and last visit was six months ago.  She voices no new pedal concerns on today's visit.  Allergies  Allergen Reactions  . Lisinopril Anaphylaxis  . Codeine     REACTION: MAKES HER SLEEPY    Objective: Physical Exam  General: [name] Cooper is a pleasant 70 y.o.  African American female, morbidly obese, in NAD. AAO x 3.   Vascular:  Neurovascular status unchanged b/l lower extremities. Capillary refill time to digits immediate b/l. Palpable DP pulses b/l. Palpable PT pulses b/l. Pedal hair sparse b/l. Skin temperature gradient within normal limits b/l.  Dermatological:  Pedal skin with normal turgor, texture and tone bilaterally. No open wounds bilaterally. No interdigital macerations bilaterally. Toenails 1-5 right, L 2nd toe, L 3rd toe, L 4th toe and L 5th toe elongated, discolored, dystrophic, thickened, and crumbly with subungual debris and tenderness to dorsal palpation. Anonychia noted L hallux. Nailbed(s) epithelialized.   Musculoskeletal:  Normal muscle strength 5/5 to all lower extremity muscle groups bilaterally. No pain crepitus or joint limitation noted with ROM b/l. No gross bony deformities bilaterally.  Neurological:  Protective sensation intact 5/5 intact bilaterally with 10g monofilament b/l. Vibratory sensation intact b/l.  Assessment and Plan:  1. Pain due to onychomycosis of toenails of both feet   2. Diabetes mellitus without complication ([place])     -Examined patient. -No new findings. No new orders. -Toenails 1-5 right, L 2nd toe, L 3rd toe, L 4th toe  and L 5th toe debrided in length and girth without iatrogenic bleeding with sterile nail nipper and dremel.  -Patient to report any pedal injuries to medical professional immediately. -Patient to continue soft, supportive shoe gear daily. -Patient/POA to call should there be question/concern in the interim.  Return in about 3 months (around 10/03/2020).  Marzetta Board, DPM

## 2020-08-13 ENCOUNTER — Other Ambulatory Visit: Payer: Self-pay

## 2020-08-13 ENCOUNTER — Inpatient Hospital Stay (HOSPITAL_BASED_OUTPATIENT_CLINIC_OR_DEPARTMENT_OTHER): Payer: 59 | Admitting: Internal Medicine

## 2020-08-13 ENCOUNTER — Inpatient Hospital Stay: Payer: 59 | Attending: Physician Assistant

## 2020-08-13 VITALS — BP 135/81 | HR 76 | Temp 97.5°F | Resp 20 | Ht 66.0 in | Wt 295.0 lb

## 2020-08-13 DIAGNOSIS — D509 Iron deficiency anemia, unspecified: Secondary | ICD-10-CM | POA: Insufficient documentation

## 2020-08-13 DIAGNOSIS — D500 Iron deficiency anemia secondary to blood loss (chronic): Secondary | ICD-10-CM | POA: Diagnosis not present

## 2020-08-13 LAB — CBC WITH DIFFERENTIAL (CANCER CENTER ONLY)
Abs Immature Granulocytes: 0.03 10*3/uL (ref 0.00–0.07)
Basophils Absolute: 0.1 10*3/uL (ref 0.0–0.1)
Basophils Relative: 1 %
Eosinophils Absolute: 0.7 10*3/uL — ABNORMAL HIGH (ref 0.0–0.5)
Eosinophils Relative: 6 %
HCT: 38.4 % (ref 36.0–46.0)
Hemoglobin: 11.8 g/dL — ABNORMAL LOW (ref 12.0–15.0)
Immature Granulocytes: 0 %
Lymphocytes Relative: 39 %
Lymphs Abs: 4.1 10*3/uL — ABNORMAL HIGH (ref 0.7–4.0)
MCH: 21.9 pg — ABNORMAL LOW (ref 26.0–34.0)
MCHC: 30.7 g/dL (ref 30.0–36.0)
MCV: 71.4 fL — ABNORMAL LOW (ref 80.0–100.0)
Monocytes Absolute: 0.8 10*3/uL (ref 0.1–1.0)
Monocytes Relative: 8 %
Neutro Abs: 4.7 10*3/uL (ref 1.7–7.7)
Neutrophils Relative %: 46 %
Platelet Count: 276 10*3/uL (ref 150–400)
RBC: 5.38 MIL/uL — ABNORMAL HIGH (ref 3.87–5.11)
RDW: 18.3 % — ABNORMAL HIGH (ref 11.5–15.5)
WBC Count: 10.4 10*3/uL (ref 4.0–10.5)
nRBC: 0 % (ref 0.0–0.2)

## 2020-08-13 LAB — IRON AND TIBC
Iron: 49 ug/dL (ref 41–142)
Saturation Ratios: 16 % — ABNORMAL LOW (ref 21–57)
TIBC: 296 ug/dL (ref 236–444)
UIBC: 248 ug/dL (ref 120–384)

## 2020-08-13 LAB — FERRITIN: Ferritin: 29 ng/mL (ref 11–307)

## 2020-08-13 NOTE — Progress Notes (Signed)
[facility] [phone]   Fax:([phone]) [phone]  OFFICE PROGRESS NOTE  [name], MD [place] [address]  DIAGNOSIS: Iron deficiency anemia  PRIOR THERAPY: None  CURRENT THERAPY:  1) IV iron PRN with venofer. Last given on 01/22/20 2) Oral iron supplement with integra plus p.o. daily.   INTERVAL HISTORY: [name] Cooper 70 y.o. female returns to the clinic today for follow-up visit.  The patient is feeling fine today with no concerning complaints except for mild fatigue.  She felt much better after receiving iron infusion 6 months ago.  She is currently on over-the-counter oral iron tablets and tolerating it fairly well.  She takes it with vitamin C.  She denied having any nausea, vomiting, diarrhea or constipation.  She denied having any fever or chills.  She has no chest pain, shortness of breath, cough or hemoptysis.  She is here today for evaluation with repeat CBC, iron study and ferritin.  MEDICAL HISTORY: Past Medical History:  Diagnosis Date  . Anemia   . Arthritis   . Cancer ([place])    breast and ovarian  . Chicken pox   . Chronic lower back pain   . COPD (chronic obstructive pulmonary disease) ([place])   . Diabetes ([place])   . GERD (gastroesophageal reflux disease)   . Hepatitis 1970's  . History of recurrent UTIs   . Hyperlipemia   . Hypertension   . Slow heart rate dx'd 09/24/2014  . Urine incontinence     ALLERGIES:  is allergic to lisinopril and codeine.  MEDICATIONS:  Current Outpatient Medications  Medication Sig Dispense Refill  . acetaminophen (TYLENOL) 650 MG CR tablet Take 1,300 mg by mouth every 8 (eight) hours as needed for pain. Reported on 09/23/2015    . albuterol (VENTOLIN HFA) 108 (90 Base) MCG/ACT inhaler Inhale 2 puffs into the lungs every 6 (six) hours as needed for wheezing or shortness of breath. 1 each 3  . Ascorbic Acid (VITAMIN C) 500 MG CAPS Take 500 mg by mouth 2 (two) times daily.     Marland Kitchen aspirin EC 81 MG tablet Take 1 tablet (81 mg total) by mouth daily. 90 tablet 3  . CRANBERRY EXTRACT PO Take 400 mg by mouth 2 (two) times daily. 2 tablets twice a day    . FeFum-FePoly-FA-B Cmp-C-Biot (INTEGRA PLUS) CAPS Take 1 capsule by mouth every morning. 30 capsule 2  . hydrALAZINE (APRESOLINE) 25 MG tablet Take 1 tablet (25 mg total) by mouth 3 (three) times daily. 270 tablet 2  . isosorbide mononitrate (IMDUR) 30 MG 24 hr tablet Take 1 tablet (30 mg total) by mouth daily. 90 tablet 2  . Lactobacillus (ACIDOPHILUS PO) Take 5 mg by mouth daily.    Marland Kitchen linagliptin (TRADJENTA) 5 MG TABS tablet Take 1 tablet (5 mg total) by mouth daily. 90 tablet 3  . Multiple Vitamin (MULTIVITAMIN WITH MINERALS) TABS tablet Take 1 tablet by mouth daily.    . nitroGLYCERIN (NITROSTAT) 0.4 MG SL tablet Place 1 tablet (0.4 mg total) under the tongue every 5 (five) minutes as needed for chest pain. 25 tablet prn  . triamterene-hydrochlorothiazide (MAXZIDE-25) 37.5-25 MG tablet Take 1 tablet by mouth daily. 90 tablet 1  . rosuvastatin (CRESTOR) 20 MG tablet Take 1 tablet (20 mg total) by mouth daily. 90 tablet 3  . Vitamin D, Ergocalciferol, (DRISDOL) 1.25 MG (50000 UNIT) CAPS capsule One capsule by mouth once a week for 12 weeks. Then take  2000IU/day (Patient not taking: Reported on 08/13/2020) 12 capsule 0   No current facility-administered medications for this visit.    SURGICAL HISTORY:  Past Surgical History:  Procedure Laterality Date  . ABDOMINAL HYSTERECTOMY  1984  . APPENDECTOMY    . BREAST BIOPSY Right 1996  . BREAST LUMPECTOMY Right 1996  . LEFT HEART CATH AND CORONARY ANGIOGRAPHY N/A 01/13/2019   Procedure: LEFT HEART CATH AND CORONARY ANGIOGRAPHY;  Surgeon: Nelva Bush, MD;  Location: [facility];  Service: Cardiovascular;  Laterality: N/A;  . MASTECTOMY Right 1996  . TONSILLECTOMY      REVIEW OF SYSTEMS:  A comprehensive review of systems was negative except for: Constitutional:  positive for fatigue   PHYSICAL EXAMINATION: General appearance: alert, cooperative, fatigued and no distress Head: Normocephalic, without obvious abnormality, atraumatic Neck: no adenopathy, no JVD, supple, symmetrical, trachea midline and thyroid not enlarged, symmetric, no tenderness/mass/nodules Lymph nodes: Cervical, supraclavicular, and axillary nodes normal. Resp: clear to auscultation bilaterally Back: symmetric, no curvature. ROM normal. No CVA tenderness. Cardio: regular rate and rhythm, S1, S2 normal, no murmur, click, rub or gallop GI: soft, non-tender; bowel sounds normal; no masses,  no organomegaly Extremities: extremities normal, atraumatic, no cyanosis or edema  ECOG PERFORMANCE STATUS: 1 - Symptomatic but completely ambulatory  Blood pressure 135/81, pulse 76, temperature (!) 97.5 F (36.4 C), temperature source Tympanic, resp. rate 20, height 5\' 6"  (1.676 m), weight 295 lb (133.8 kg), SpO2 96 %.  LABORATORY DATA: Lab Results  Component Value Date   WBC 10.4 08/13/2020   HGB 11.8 (L) 08/13/2020   HCT 38.4 08/13/2020   MCV 71.4 (L) 08/13/2020   PLT 276 08/13/2020      Chemistry      Component Value Date/Time   NA 141 07/08/2020 1422   NA 140 01/30/2019 1330   K 3.7 07/08/2020 1422   CL 101 07/08/2020 1422   CO2 31 07/08/2020 1422   BUN 18 07/08/2020 1422   BUN 18 01/30/2019 1330   CREATININE 1.20 07/08/2020 1422   CREATININE 1.20 (H) 12/21/2019 1336   CREATININE 1.28 (H) 09/21/2016 1115      Component Value Date/Time   CALCIUM 9.5 07/08/2020 1422   ALKPHOS 88 07/08/2020 1422   AST 13 07/08/2020 1422   AST 12 (L) 12/21/2019 1336   ALT 9 07/08/2020 1422   ALT 8 12/21/2019 1336   BILITOT 0.4 07/08/2020 1422   BILITOT 0.4 12/21/2019 1336       RADIOGRAPHIC STUDIES: No results found.  ASSESSMENT AND PLAN: This is a very pleasant 70 years old African-American female with iron deficiency anemia status post Venofer infusion in October 2021.   She is  currently on oral iron tablet and tolerating it fairly well. Repeat CBC today showed mild anemia with hemoglobin of 11.8.  The iron study and ferritin are still pending. I recommended for the patient to continue with the oral iron tablet for now and I will see her back for follow-up visit in 3 months for evaluation and repeat CBC, iron study and ferritin. If the patient has significant iron deficiency on the pending blood work, I will call her for iron infusion in the interval. She was advised to call immediately if she has any other concerning symptoms in the interval. The patient voices understanding of current disease status and treatment options and is in agreement with the current care plan.  All questions were answered. The patient knows to call the clinic with any problems, questions  or concerns. We can certainly see the patient much sooner if necessary.  Disclaimer: This note was dictated with voice recognition software. Similar sounding words can inadvertently be transcribed and may not be corrected upon review.

## 2020-08-15 ENCOUNTER — Telehealth: Payer: Self-pay | Admitting: Internal Medicine

## 2020-08-15 NOTE — Telephone Encounter (Signed)
Scheduled per los. Called and spoke with patient. Confirmed appt 

## 2020-10-03 ENCOUNTER — Other Ambulatory Visit: Payer: Self-pay | Admitting: Family Medicine

## 2020-10-03 DIAGNOSIS — Z1231 Encounter for screening mammogram for malignant neoplasm of breast: Secondary | ICD-10-CM

## 2020-10-11 ENCOUNTER — Other Ambulatory Visit: Payer: Self-pay | Admitting: Family Medicine

## 2020-10-11 ENCOUNTER — Other Ambulatory Visit: Payer: Self-pay | Admitting: Cardiology

## 2020-10-14 ENCOUNTER — Other Ambulatory Visit: Payer: Self-pay

## 2020-10-14 ENCOUNTER — Other Ambulatory Visit: Payer: Self-pay | Admitting: *Deleted

## 2020-10-14 MED ORDER — ISOSORBIDE MONONITRATE ER 30 MG PO TB24: 30.0000 mg | ORAL_TABLET | Freq: Every day | ORAL | 1 refills | Status: DC

## 2020-10-16 ENCOUNTER — Ambulatory Visit (INDEPENDENT_AMBULATORY_CARE_PROVIDER_SITE_OTHER): Payer: 59 | Admitting: Podiatry

## 2020-10-16 ENCOUNTER — Other Ambulatory Visit: Payer: Self-pay

## 2020-10-16 ENCOUNTER — Encounter: Payer: Self-pay | Admitting: Podiatry

## 2020-10-16 DIAGNOSIS — B351 Tinea unguium: Secondary | ICD-10-CM | POA: Diagnosis not present

## 2020-10-16 DIAGNOSIS — E119 Type 2 diabetes mellitus without complications: Secondary | ICD-10-CM

## 2020-10-16 DIAGNOSIS — M79674 Pain in right toe(s): Secondary | ICD-10-CM

## 2020-10-16 DIAGNOSIS — M79675 Pain in left toe(s): Secondary | ICD-10-CM | POA: Diagnosis not present

## 2020-10-17 NOTE — Progress Notes (Signed)
Subjective: [name] Cooper is a pleasant 70 y.o. female patient seen today for painful thick toenails that are difficult to trim. Pain interferes with ambulation. Aggravating factors include wearing enclosed shoe gear. Pain is relieved with periodic professional debridement.  PCP is [name], MD. Last visit was: 07/08/2020.  Allergies  Allergen Reactions   Lisinopril Anaphylaxis   Codeine     REACTION: MAKES HER SLEEPY    Objective: Physical Exam  General: [name] Cooper is a pleasant 70 y.o. African American female, morbidly obese in NAD. AAO x 3.   Vascular:  Capillary refill time to digits immediate b/l. Palpable pedal pulses b/l LE. Pedal hair sparse. Lower extremity skin temperature gradient within normal limits. No pain with calf compression b/l.  Dermatological:  Pedal skin with normal turgor, texture and tone b/l lower extremities No open wounds b/l lower extremities No interdigital macerations b/l lower extremities Toenails 1-5 right, L 2nd toe, L 3rd toe, L 4th toe, and L 5th toe elongated, discolored, dystrophic, thickened, and crumbly with subungual debris and tenderness to dorsal palpation. Anonychia noted L hallux. Nailbed(s) epithelialized.   Musculoskeletal:  Normal muscle strength 5/5 to all lower extremity muscle groups bilaterally. No pain crepitus or joint limitation noted with ROM b/l. No gross bony deformities bilaterally.  Neurological:  Protective sensation intact 5/5 intact bilaterally with 10g monofilament b/l. Vibratory sensation intact b/l.  Assessment and Plan:  1. Pain due to onychomycosis of toenails of both feet   2. Diabetes mellitus without complication ([place])     -Examined patient. -Patient to continue soft, supportive shoe gear daily. -Toenails 2-5 bilaterally and R hallux debrided in length and girth without iatrogenic bleeding with sterile nail nipper and dremel.  -Patient to report any pedal injuries to medical professional  immediately. -Patient/POA to call should there be question/concern in the interim.  Return in about 3 months (around 01/16/2021).  Marzetta Board, DPM

## 2020-11-08 ENCOUNTER — Other Ambulatory Visit: Payer: Self-pay

## 2020-11-08 DIAGNOSIS — D500 Iron deficiency anemia secondary to blood loss (chronic): Secondary | ICD-10-CM

## 2020-11-11 ENCOUNTER — Inpatient Hospital Stay: Payer: 59 | Attending: Internal Medicine | Admitting: Internal Medicine

## 2020-11-11 ENCOUNTER — Other Ambulatory Visit: Payer: Self-pay

## 2020-11-11 ENCOUNTER — Inpatient Hospital Stay: Payer: 59

## 2020-11-11 VITALS — BP 138/80 | HR 78 | Temp 97.3°F | Resp 20 | Ht 66.0 in | Wt 292.9 lb

## 2020-11-11 DIAGNOSIS — D500 Iron deficiency anemia secondary to blood loss (chronic): Secondary | ICD-10-CM | POA: Diagnosis not present

## 2020-11-11 DIAGNOSIS — D509 Iron deficiency anemia, unspecified: Secondary | ICD-10-CM | POA: Insufficient documentation

## 2020-11-11 LAB — CBC WITH DIFFERENTIAL (CANCER CENTER ONLY)
Abs Immature Granulocytes: 0.03 10*3/uL (ref 0.00–0.07)
Basophils Absolute: 0.1 10*3/uL (ref 0.0–0.1)
Basophils Relative: 1 %
Eosinophils Absolute: 0.4 10*3/uL (ref 0.0–0.5)
Eosinophils Relative: 4 %
HCT: 31.1 % — ABNORMAL LOW (ref 36.0–46.0)
Hemoglobin: 9.5 g/dL — ABNORMAL LOW (ref 12.0–15.0)
Immature Granulocytes: 0 %
Lymphocytes Relative: 36 %
Lymphs Abs: 3.5 10*3/uL (ref 0.7–4.0)
MCH: 20.3 pg — ABNORMAL LOW (ref 26.0–34.0)
MCHC: 30.5 g/dL (ref 30.0–36.0)
MCV: 66.6 fL — ABNORMAL LOW (ref 80.0–100.0)
Monocytes Absolute: 1 10*3/uL (ref 0.1–1.0)
Monocytes Relative: 10 %
Neutro Abs: 4.8 10*3/uL (ref 1.7–7.7)
Neutrophils Relative %: 49 %
Platelet Count: 267 10*3/uL (ref 150–400)
RBC: 4.67 MIL/uL (ref 3.87–5.11)
RDW: 17.3 % — ABNORMAL HIGH (ref 11.5–15.5)
WBC Count: 9.8 10*3/uL (ref 4.0–10.5)
nRBC: 0 % (ref 0.0–0.2)

## 2020-11-11 LAB — IRON AND TIBC
Iron: 22 ug/dL — ABNORMAL LOW (ref 41–142)
Saturation Ratios: 6 % — ABNORMAL LOW (ref 21–57)
TIBC: 355 ug/dL (ref 236–444)
UIBC: 333 ug/dL (ref 120–384)

## 2020-11-11 LAB — FERRITIN: Ferritin: 9 ng/mL — ABNORMAL LOW (ref 11–307)

## 2020-11-11 NOTE — Progress Notes (Signed)
[facility] [phone]   [phone]  OFFICE PROGRESS NOTE  [name], MD [place] [address]  DIAGNOSIS: Iron deficiency anemia   PRIOR THERAPY: None   CURRENT THERAPY:  1) IV iron PRN with venofer. Last given on 01/22/20 2) Oral iron supplement with integra plus p.o. daily.    INTERVAL HISTORY: [name] Cooper 70 y.o. female returns to the clinic today for 26-monthfollow-up visit.  The patient is feeling fine today with no concerning complaints except for mild fatigue.  She denied having any shortness of breath or dizzy spells.  She denied having any bleeding, bruises or ecchymosis.  She has no chest pain, cough or hemoptysis.  She denied having any nausea, vomiting, diarrhea or constipation.  She continues to tolerate her treatment with Integra plus fairly well.  The patient is here today for evaluation and repeat blood work.  MEDICAL HISTORY: Past Medical History:  Diagnosis Date   Anemia    Arthritis    Cancer ([place]    breast and ovarian   Chicken pox    Chronic lower back pain    COPD (chronic obstructive pulmonary disease) (HCC)    Diabetes (HCC)    GERD (gastroesophageal reflux disease)    Hepatitis 1970's   History of recurrent UTIs    Hyperlipemia    Hypertension    Slow heart rate dx'd 09/24/2014   Urine incontinence     ALLERGIES:  is allergic to lisinopril and codeine.  MEDICATIONS:  Current Outpatient Medications  Medication Sig Dispense Refill   hydrALAZINE (APRESOLINE) 25 MG tablet TAKE 1 TABLET BY MOUTH THREE TIMES DAILY 228 tablet 0   acetaminophen (TYLENOL) 650 MG CR tablet Take 1,300 mg by mouth every 8 (eight) hours as needed for pain. Reported on 09/23/2015     albuterol (VENTOLIN HFA) 108 (90 Base) MCG/ACT inhaler Inhale 2 puffs into the lungs every 6 (six) hours as needed for wheezing or shortness of breath. 1 each 3   Ascorbic Acid (VITAMIN C) 500 MG CAPS Take 500 mg by mouth 2  (two) times daily.     aspirin EC 81 MG tablet Take 1 tablet (81 mg total) by mouth daily. 90 tablet 3   CRANBERRY EXTRACT PO Take 400 mg by mouth 2 (two) times daily. 2 tablets twice a day     FeFum-FePoly-FA-B Cmp-C-Biot (INTEGRA PLUS) CAPS Take 1 capsule by mouth every morning. 30 capsule 2   isosorbide mononitrate (IMDUR) 30 MG 24 hr tablet Take 1 tablet (30 mg total) by mouth daily. 90 tablet 1   Lactobacillus (ACIDOPHILUS PO) Take 5 mg by mouth daily.     linagliptin (TRADJENTA) 5 MG TABS tablet Take 1 tablet (5 mg total) by mouth daily. 90 tablet 3   Multiple Vitamin (MULTIVITAMIN WITH MINERALS) TABS tablet Take 1 tablet by mouth daily.     nitroGLYCERIN (NITROSTAT) 0.4 MG SL tablet Place 1 tablet (0.4 mg total) under the tongue every 5 (five) minutes as needed for chest pain. 25 tablet prn   rosuvastatin (CRESTOR) 20 MG tablet Take 1 tablet (20 mg total) by mouth daily. 90 tablet 3   triamterene-hydrochlorothiazide (MAXZIDE-25) 37.5-25 MG tablet Take 1 tablet by mouth daily. 90 tablet 1   Vitamin D, Ergocalciferol, (DRISDOL) 1.25 MG (50000 UNIT) CAPS capsule One capsule by mouth once a week for 12 weeks. Then take 2000IU/day (Patient not taking: Reported on 08/13/2020) 12 capsule 0   No current  facility-administered medications for this visit.    SURGICAL HISTORY:  Past Surgical History:  Procedure Laterality Date   ABDOMINAL HYSTERECTOMY  1984   APPENDECTOMY     BREAST BIOPSY Right 1996   BREAST LUMPECTOMY Right 1996   LEFT HEART CATH AND CORONARY ANGIOGRAPHY N/A 01/13/2019   Procedure: LEFT HEART CATH AND CORONARY ANGIOGRAPHY;  Surgeon: Nelva Bush, MD;  Location: [facility];  Service: Cardiovascular;  Laterality: N/A;   MASTECTOMY Right 1996   TONSILLECTOMY      REVIEW OF SYSTEMS:  A comprehensive review of systems was negative except for: Constitutional: positive for fatigue   PHYSICAL EXAMINATION: General appearance: alert, cooperative, and no distress Head:  Normocephalic, without obvious abnormality, atraumatic Neck: no adenopathy, no JVD, supple, symmetrical, trachea midline, and thyroid not enlarged, symmetric, no tenderness/mass/nodules Lymph nodes: Cervical, supraclavicular, and axillary nodes normal. Resp: clear to auscultation bilaterally Back: symmetric, no curvature. ROM normal. No CVA tenderness. Cardio: regular rate and rhythm, S1, S2 normal, no murmur, click, rub or gallop GI: soft, non-tender; bowel sounds normal; no masses,  no organomegaly Extremities: extremities normal, atraumatic, no cyanosis or edema  ECOG PERFORMANCE STATUS: 1 - Symptomatic but completely ambulatory  Blood pressure 138/80, pulse 78, temperature (!) 97.3 F (36.3 C), temperature source Tympanic, resp. rate 20, height '5\' 6"'$  (1.676 m), weight 292 lb 14.4 oz (132.9 kg), SpO2 98 %.  LABORATORY DATA: Lab Results  Component Value Date   WBC 9.8 11/11/2020   HGB 9.5 (L) 11/11/2020   HCT 31.1 (L) 11/11/2020   MCV 66.6 (L) 11/11/2020   PLT 267 11/11/2020      Chemistry      Component Value Date/Time   NA 141 07/08/2020 1422   NA 140 01/30/2019 1330   K 3.7 07/08/2020 1422   CL 101 07/08/2020 1422   CO2 31 07/08/2020 1422   BUN 18 07/08/2020 1422   BUN 18 01/30/2019 1330   CREATININE 1.20 07/08/2020 1422   CREATININE 1.20 (H) 12/21/2019 1336   CREATININE 1.28 (H) 09/21/2016 1115      Component Value Date/Time   CALCIUM 9.5 07/08/2020 1422   ALKPHOS 88 07/08/2020 1422   AST 13 07/08/2020 1422   AST 12 (L) 12/21/2019 1336   ALT 9 07/08/2020 1422   ALT 8 12/21/2019 1336   BILITOT 0.4 07/08/2020 1422   BILITOT 0.4 12/21/2019 1336       RADIOGRAPHIC STUDIES: No results found.  ASSESSMENT AND PLAN: This is a very pleasant 70 years old African-American female with iron deficiency anemia status post Venofer infusion in October 2021.   The patient is status post iron infusion with Venofer completed last year and tolerated it fairly well.  She has  been on oral iron tablet with Integra +1 capsule p.o. daily. Repeat CBC and iron studies today showed persistent iron deficiency anemia was worsening of her iron study. I recommended for the patient to proceed with iron infusion with Venofer 300 Mg IV weekly for 3 weeks. She will start the first dose of this treatment on November 15, 2020. She will continue her oral iron tablet with Integra +1 capsule p.o. daily. I will see the patient back for follow-up visit in 3 months for evaluation with repeat CBC, iron study and ferritin. She was advised to call immediately if she has any other concerning symptoms in the interval. The patient voices understanding of current disease status and treatment options and is in agreement with the current care plan.  All  questions were answered. The patient knows to call the clinic with any problems, questions or concerns. We can certainly see the patient much sooner if necessary.  Disclaimer: This note was dictated with voice recognition software. Similar sounding words can inadvertently be transcribed and may not be corrected upon review.

## 2020-11-27 ENCOUNTER — Ambulatory Visit: Admission: RE | Admit: 2020-11-27 | Payer: 59 | Source: Ambulatory Visit

## 2020-11-27 ENCOUNTER — Ambulatory Visit
Admission: RE | Admit: 2020-11-27 | Discharge: 2020-11-27 | Disposition: A | Discharge status: Home / Self Care | Payer: 59 | Source: Ambulatory Visit | Attending: Family Medicine | Admitting: Family Medicine

## 2020-11-27 ENCOUNTER — Telehealth: Payer: Self-pay | Admitting: Medical Oncology

## 2020-11-27 ENCOUNTER — Other Ambulatory Visit: Payer: Self-pay | Admitting: Acute Care

## 2020-11-27 ENCOUNTER — Other Ambulatory Visit: Payer: Self-pay

## 2020-11-27 DIAGNOSIS — Z1231 Encounter for screening mammogram for malignant neoplasm of breast: Secondary | ICD-10-CM

## 2020-11-27 DIAGNOSIS — Z87891 Personal history of nicotine dependence: Secondary | ICD-10-CM

## 2020-11-27 NOTE — Telephone Encounter (Signed)
Returned pt call. She confirmed appt for iron on 11/29/20.

## 2020-11-28 IMAGING — MG DIGITAL SCREENING UNILAT LEFT W/ TOMO W/ CAD
4 series · 4 of 12 positions shown · non-contrast
Comparison: Previous exam(s).

CLINICAL DATA: Screening. Prior malignant RIGHT mastectomy in 8660.

EXAM:
DIGITAL SCREENING UNILATERAL LEFT MAMMOGRAM WITH CAD AND TOMO

[L MLO synth-2D]
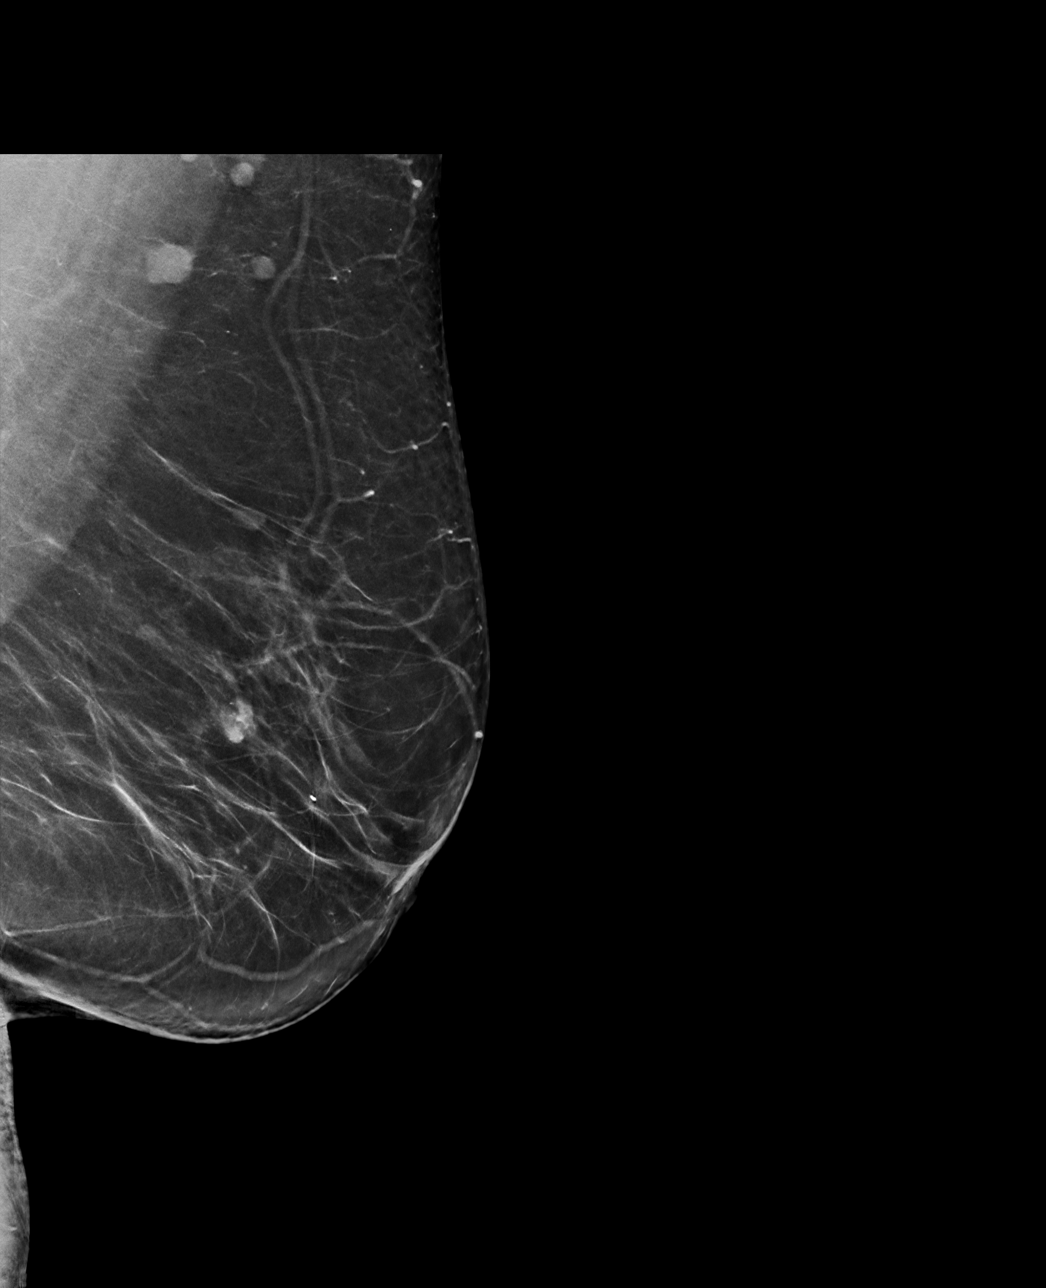

[L CC synth-2D]
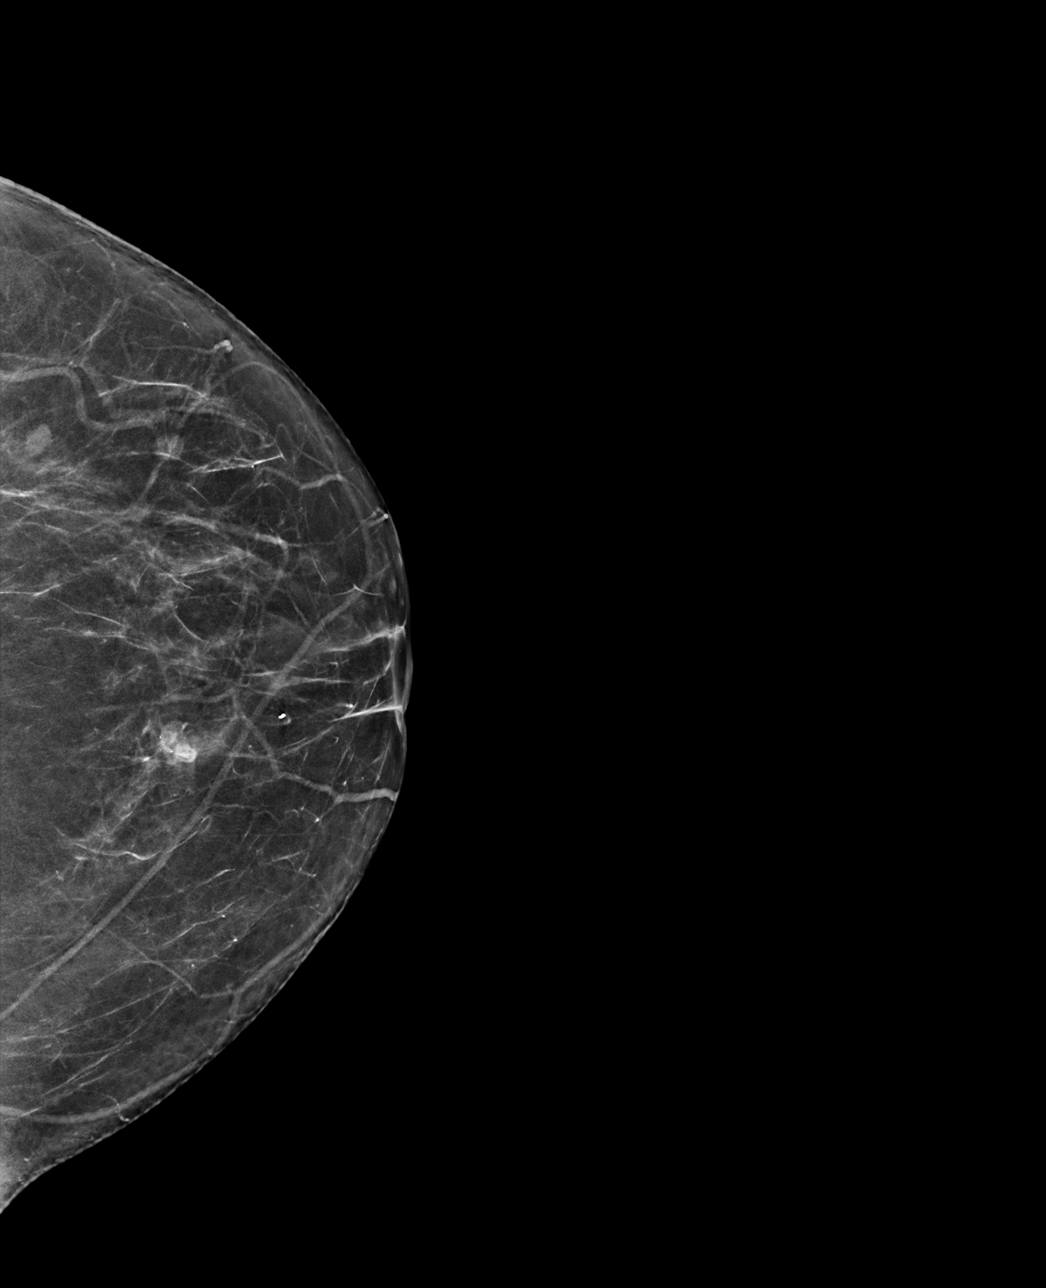

[L CC tomo · tomo slice 32/63.0]
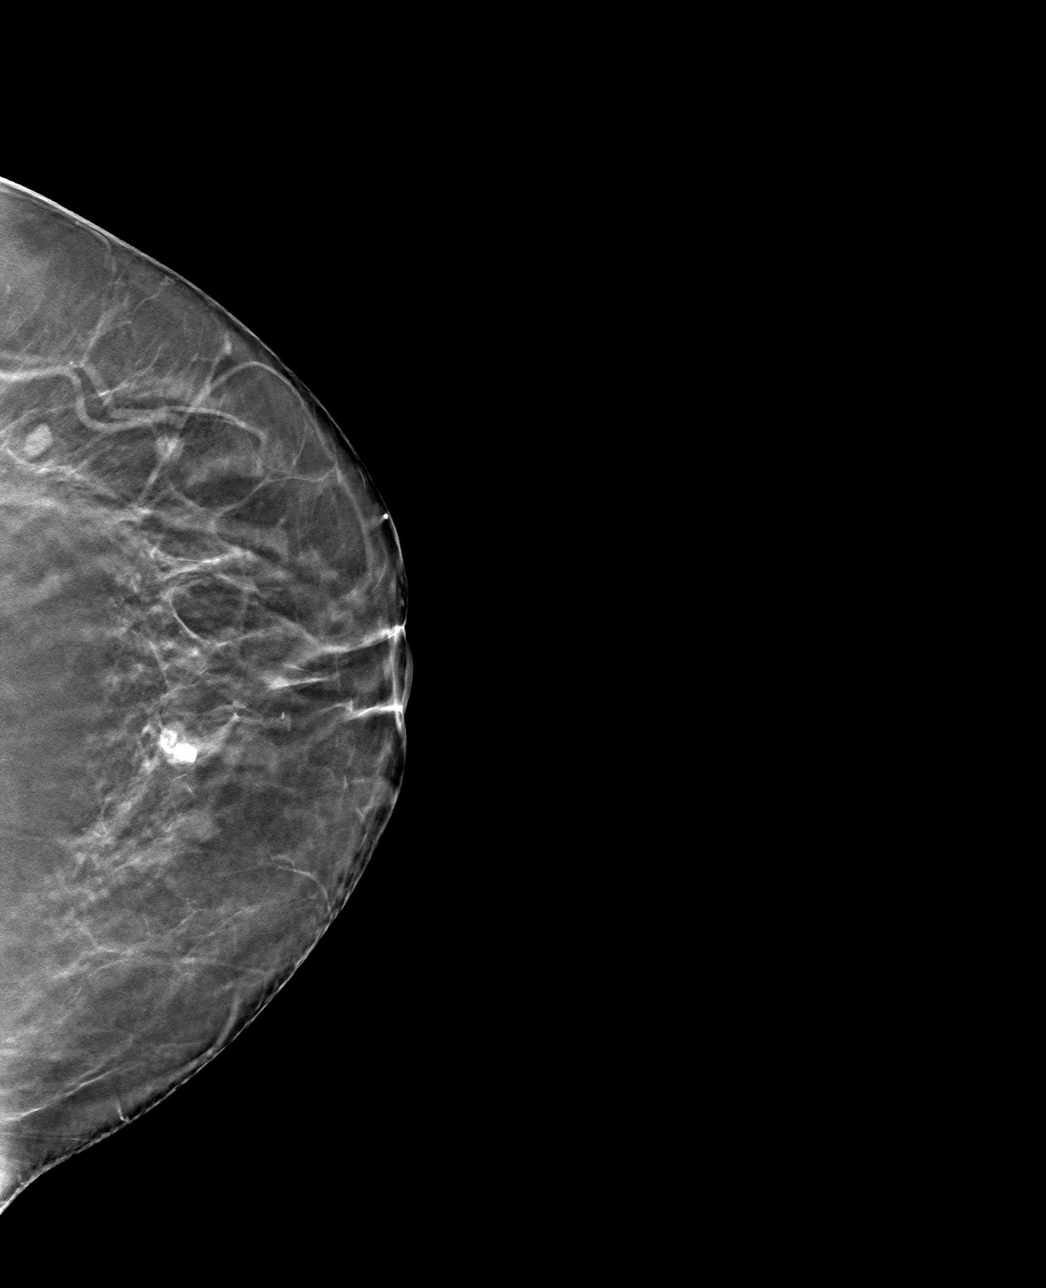

[L MLO tomo · tomo slice 41/80.0]
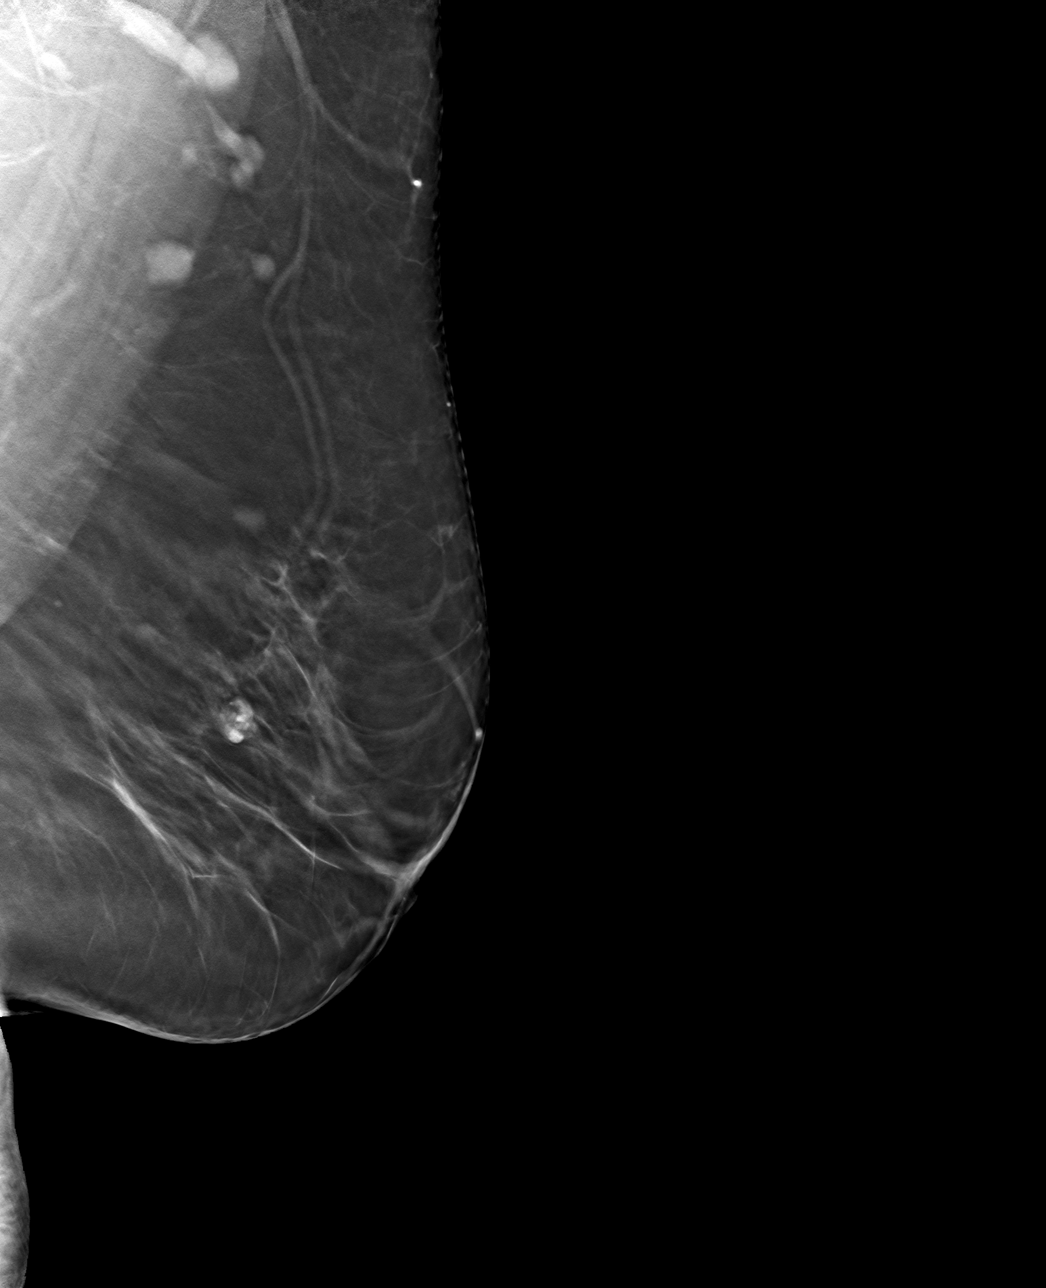

[4 of 12 positions shown; findings below may reference images not displayed]

ACR Breast Density Category b: There are scattered areas of
fibroglandular density.
FINDINGS: The patient has had a right mastectomy. There are no findings
suspicious for malignancy in the LEFT breast. Images were processed
with CAD.
IMPRESSION: No mammographic evidence of malignancy. A result letter of this
screening mammogram will be mailed directly to the patient.

RECOMMENDATION:
Screening mammogram in one year.  (Code:[id number])

BI-RADS CATEGORY  1: Negative.

## 2020-11-29 ENCOUNTER — Inpatient Hospital Stay: Payer: 59 | Attending: Internal Medicine

## 2020-11-29 ENCOUNTER — Other Ambulatory Visit: Payer: Self-pay

## 2020-11-29 ENCOUNTER — Other Ambulatory Visit: Payer: Self-pay | Admitting: Family Medicine

## 2020-11-29 VITALS — BP 116/64 | HR 57 | Temp 98.4°F | Resp 18

## 2020-11-29 DIAGNOSIS — D509 Iron deficiency anemia, unspecified: Secondary | ICD-10-CM | POA: Diagnosis not present

## 2020-11-29 MED ORDER — DIPHENHYDRAMINE HCL 25 MG PO CAPS
50.0000 mg | ORAL_CAPSULE | Freq: Once | ORAL | Status: AC
  Administered 2020-11-29: 50 mg via ORAL
  Filled 2020-11-29: qty 2

## 2020-11-29 MED ORDER — SODIUM CHLORIDE 0.9 % IV SOLN
300.0000 mg | Freq: Once | INTRAVENOUS | Status: AC
  Administered 2020-11-29: 300 mg via INTRAVENOUS
  Filled 2020-11-29: qty 300

## 2020-11-29 MED ORDER — SODIUM CHLORIDE 0.9 % IV SOLN
Freq: Once | INTRAVENOUS | Status: AC
  Filled 2020-11-29: qty 250

## 2020-11-29 NOTE — Patient Instructions (Signed)
[facility]  Discharge Instructions: Thank you for choosing [facility] to provide your oncology and hematology care.   If you have a lab appointment with the [facility], please go directly to the [facility] and check in at the registration area.   Wear comfortable clothing and clothing appropriate for easy access to any Portacath or PICC line.   We strive to give you quality time with your provider. You may need to reschedule your appointment if you arrive late (15 or more minutes).  Arriving late affects you and other patients whose appointments are after yours.  Also, if you miss three or more appointments without notifying the office, you may be dismissed from the clinic at the provider's discretion.      For prescription refill requests, have your pharmacy contact our office and allow 72 hours for refills to be completed.    Today you received the following chemotherapy and/or immunotherapy agents Venofer      To help prevent nausea and vomiting after your treatment, we encourage you to take your nausea medication as directed.  BELOW ARE SYMPTOMS THAT SHOULD BE REPORTED IMMEDIATELY: *FEVER GREATER THAN 100.4 F (38 C) OR HIGHER *CHILLS OR SWEATING *NAUSEA AND VOMITING THAT IS NOT CONTROLLED WITH YOUR NAUSEA MEDICATION *UNUSUAL SHORTNESS OF BREATH *UNUSUAL BRUISING OR BLEEDING *URINARY PROBLEMS (pain or burning when urinating, or frequent urination) *BOWEL PROBLEMS (unusual diarrhea, constipation, pain near the anus) TENDERNESS IN MOUTH AND THROAT WITH OR WITHOUT PRESENCE OF ULCERS (sore throat, sores in mouth, or a toothache) UNUSUAL RASH, SWELLING OR PAIN  UNUSUAL VAGINAL DISCHARGE OR ITCHING   Items with * indicate a potential emergency and should be followed up as soon as possible or go to the Emergency Department if any problems should occur.  Please show the CHEMOTHERAPY ALERT CARD or IMMUNOTHERAPY ALERT CARD at check-in to the  Emergency Department and triage nurse.  Should you have questions after your visit or need to cancel or reschedule your appointment, please contact [facility]  Dept: [phone]  and follow the prompts.  Office hours are 8:00 a.m. to 4:30 p.m. Monday - Friday. Please note that voicemails left after 4:00 p.m. may not be returned until the following business day.  We are closed weekends and major holidays. You have access to a nurse at all times for urgent questions. Please call the main number to the clinic Dept: [phone] and follow the prompts.   For any non-urgent questions, you may also contact your provider using MyChart. We now offer e-Visits for anyone 30 and older to request care online for non-urgent symptoms. For details visit [email or web address].   Also download the MyChart app! Go to the app store, search "MyChart", open the app, select [facility], and log in with your MyChart username and password.  Due to Covid, a mask is required upon entering the hospital/clinic. If you do not have a mask, one will be given to you upon arrival. For doctor visits, patients may have 1 support person aged 32 or older with them. For treatment visits, patients cannot have anyone with them due to current Covid guidelines and our immunocompromised population.

## 2020-12-06 ENCOUNTER — Inpatient Hospital Stay: Payer: 59

## 2020-12-06 ENCOUNTER — Other Ambulatory Visit: Payer: Self-pay

## 2020-12-06 VITALS — BP 109/63 | HR 58 | Temp 97.9°F | Resp 17

## 2020-12-06 DIAGNOSIS — D509 Iron deficiency anemia, unspecified: Secondary | ICD-10-CM | POA: Diagnosis not present

## 2020-12-06 MED ORDER — SODIUM CHLORIDE 0.9 % IV SOLN
300.0000 mg | Freq: Once | INTRAVENOUS | Status: AC
  Administered 2020-12-06: 300 mg via INTRAVENOUS
  Filled 2020-12-06: qty 300

## 2020-12-06 MED ORDER — SODIUM CHLORIDE 0.9 % IV SOLN: Freq: Once | INTRAVENOUS | Status: AC

## 2020-12-06 MED ORDER — DIPHENHYDRAMINE HCL 25 MG PO CAPS
50.0000 mg | ORAL_CAPSULE | Freq: Once | ORAL | Status: AC
  Administered 2020-12-06: 50 mg via ORAL
  Filled 2020-12-06: qty 2

## 2020-12-06 NOTE — Patient Instructions (Signed)

## 2020-12-06 NOTE — Progress Notes (Signed)
Pt declined to stay for 30 minute post Venofer observation.  VSS at discharge.  Pt ambulatory to lobby.

## 2020-12-08 ENCOUNTER — Other Ambulatory Visit: Payer: Self-pay | Admitting: Family Medicine

## 2020-12-09 ENCOUNTER — Other Ambulatory Visit: Payer: Self-pay

## 2020-12-11 ENCOUNTER — Encounter: Payer: Self-pay | Admitting: Physician Assistant

## 2020-12-11 ENCOUNTER — Telehealth: Payer: Self-pay | Admitting: Medical Oncology

## 2020-12-11 ENCOUNTER — Inpatient Hospital Stay (HOSPITAL_BASED_OUTPATIENT_CLINIC_OR_DEPARTMENT_OTHER): Payer: 59 | Admitting: Physician Assistant

## 2020-12-11 ENCOUNTER — Other Ambulatory Visit: Payer: Self-pay

## 2020-12-11 DIAGNOSIS — R2230 Localized swelling, mass and lump, unspecified upper limb: Secondary | ICD-10-CM | POA: Diagnosis not present

## 2020-12-11 DIAGNOSIS — D509 Iron deficiency anemia, unspecified: Secondary | ICD-10-CM | POA: Diagnosis not present

## 2020-12-11 NOTE — Progress Notes (Signed)
[facility] Symptom management NOTE  [name], MD [place] [address]  DIAGNOSIS: Iron deficiency anemia  PRIOR THERAPY: None  CURRENT THERAPY: 1) IV iron PRN with venofer. Last given on 12/06/20 2) Oral iron supplement with integra plus p.o. daily  INTERVAL HISTORY: [name] Cooper 70 y.o. female returns to the clinic today for an acute visit.  The patient is followed by our clinic for iron deficiency anemia.  She had a IV iron infusion last week on 12/06/2020.  She had the IV in her right forearm.  On Monday 12/09/2020 the patient started noticing localized swelling and pain at the IV site.  She describes the pain as throbbing.  There is mild erythema.  There is no drainage or warmth.  She has not tried anything to help alleviate her symptoms such as tylenol.  This did not happen with her previous iron infusions although she does have some discoloration along the vein in her right forearm from her prior infusions.  She does not have any other swelling in the rest of her right arm including the upper extremity or in the hands distal to the IV site.  She does have a history of right-sided breast cancer status post mastectomy, but once again does not have any lymphedema.  She denies any fevers.  She sometimes gets hot at night at baseline which requires her to turn on a fan.  Denies history of blood clots.  Denies history of heart failure.  She takes a 81 mg aspirin.  She is here today for evaluation and recommendations regarding this finding.   MEDICAL HISTORY: Past Medical History:  Diagnosis Date   Anemia    Arthritis    Cancer ([place])    breast and ovarian   Chicken pox    Chronic lower back pain    COPD (chronic obstructive pulmonary disease) (HCC)    Diabetes (HCC)    GERD (gastroesophageal reflux disease)    Hepatitis 1970's   History of recurrent UTIs    Hyperlipemia    Hypertension    Slow heart rate dx'd 09/24/2014   Urine incontinence      ALLERGIES:  is allergic to lisinopril and codeine.  MEDICATIONS:  Current Outpatient Medications  Medication Sig Dispense Refill   acetaminophen (TYLENOL) 650 MG CR tablet Take 1,300 mg by mouth every 8 (eight) hours as needed for pain. Reported on 09/23/2015     albuterol (VENTOLIN HFA) 108 (90 Base) MCG/ACT inhaler Inhale 2 puffs into the lungs every 6 (six) hours as needed for wheezing or shortness of breath. 1 each 3   Ascorbic Acid (VITAMIN C) 500 MG CAPS Take 500 mg by mouth 2 (two) times daily.     aspirin EC 81 MG tablet Take 1 tablet (81 mg total) by mouth daily. 90 tablet 3   CRANBERRY EXTRACT PO Take 400 mg by mouth 2 (two) times daily. 2 tablets twice a day     FeFum-FePoly-FA-B Cmp-C-Biot (INTEGRA PLUS) CAPS Take 1 capsule by mouth every morning. 30 capsule 2   hydrALAZINE (APRESOLINE) 25 MG tablet TAKE 1 TABLET BY MOUTH THREE TIMES DAILY 228 tablet 0   isosorbide mononitrate (IMDUR) 30 MG 24 hr tablet Take 1 tablet (30 mg total) by mouth daily. 90 tablet 1   Lactobacillus (ACIDOPHILUS PO) Take 5 mg by mouth daily.     linagliptin (TRADJENTA) 5 MG TABS tablet Take 1 tablet (5 mg total) by mouth daily. 90 tablet 3  Multiple Vitamin (MULTIVITAMIN WITH MINERALS) TABS tablet Take 1 tablet by mouth daily.     nitroGLYCERIN (NITROSTAT) 0.4 MG SL tablet Place 1 tablet (0.4 mg total) under the tongue every 5 (five) minutes as needed for chest pain. 25 tablet prn   rosuvastatin (CRESTOR) 20 MG tablet Take 1 tablet by mouth once daily 21 tablet 1   triamterene-hydrochlorothiazide (MAXZIDE-25) 37.5-25 MG tablet Take 1 tablet by mouth once daily 12 tablet 0   Vitamin D, Ergocalciferol, (DRISDOL) 1.25 MG (50000 UNIT) CAPS capsule One capsule by mouth once a week for 12 weeks. Then take 2000IU/day 12 capsule 0   No current facility-administered medications for this visit.    SURGICAL HISTORY:  Past Surgical History:  Procedure Laterality Date   ABDOMINAL HYSTERECTOMY  1984    APPENDECTOMY     BREAST BIOPSY Right 1996   BREAST LUMPECTOMY Right 1996   LEFT HEART CATH AND CORONARY ANGIOGRAPHY N/A 01/13/2019   Procedure: LEFT HEART CATH AND CORONARY ANGIOGRAPHY;  Surgeon: Nelva Bush, MD;  Location: [facility];  Service: Cardiovascular;  Laterality: N/A;   MASTECTOMY Right 1996   TONSILLECTOMY      REVIEW OF SYSTEMS:   Review of Systems  Constitutional: Negative for appetite change, chills, fatigue, fever and unexpected weight change.  HENT: Negative for mouth sores, nosebleeds, sore throat and trouble swallowing.   Eyes: Negative for eye problems and icterus.  Respiratory: Negative for cough, hemoptysis, shortness of breath and wheezing.   Cardiovascular: Negative for chest pain and leg swelling.  Gastrointestinal: Negative for abdominal pain, constipation, diarrhea, nausea and vomiting.  Genitourinary: Negative for bladder incontinence, difficulty urinating, dysuria, frequency and hematuria.   Musculoskeletal: Negative for back pain, gait problem, neck pain and neck stiffness.  Skin: Positive for localized swelling at the distal right forearm. Tenderness to palpitation. No drainage or fluctuance. No warmth. Mild erythema. No discoloration except for more proximal there is some skin darkening along the vein in the right arm. Negative for itching and rash.  Neurological: Negative for dizziness, extremity weakness, gait problem, headaches, light-headedness and seizures.  Hematological: Negative for adenopathy. Does not bruise/bleed easily.  Psychiatric/Behavioral: Negative for confusion, depression and sleep disturbance. The patient is not nervous/anxious.     PHYSICAL EXAMINATION:  Blood pressure 122/73, pulse 81, temperature 98.4 F (36.9 C), temperature source Oral, resp. rate 17, weight 292 lb 6 oz (132.6 kg), SpO2 97 %.  ECOG PERFORMANCE STATUS: 1  Physical Exam  Constitutional: Oriented to person, place, and time and well-developed,  well-nourished, and in no distress. No distress.  HENT:  Head: Normocephalic and atraumatic.  Mouth/Throat: Oropharynx is clear and moist. No oropharyngeal exudate.  Eyes: Conjunctivae are normal. Right eye exhibits no discharge. Left eye exhibits no discharge. No scleral icterus.  Neck: Normal range of motion. Neck supple.  Cardiovascular: Normal rate, regular rhythm, normal heart sounds and intact distal pulses.   Pulmonary/Chest: Effort normal and breath sounds normal. No respiratory distress. No wheezes. No rales.  Abdominal: Soft. Bowel sounds are normal. Exhibits no distension and no mass. There is no tenderness.  Musculoskeletal: Normal range of motion. Exhibits no edema.  Lymphadenopathy:    No cervical adenopathy.  Neurological: Alert and oriented to person, place, and time. Exhibits normal muscle tone. Gait normal. Coordination normal.  Skin: Positive for localized swelling at the distal right forearm. Tenderness to palpitation. No drainage or fluctuance. No warmth. Mild erythema. No discoloration except for more proximal there is some skin darkening along the  vein in the right arm. Negative for itching and rash.    Psychiatric: Mood, memory and judgment normal.  Vitals reviewed.  LABORATORY DATA: Lab Results  Component Value Date   WBC 9.8 11/11/2020   HGB 9.5 (L) 11/11/2020   HCT 31.1 (L) 11/11/2020   MCV 66.6 (L) 11/11/2020   PLT 267 11/11/2020      Chemistry      Component Value Date/Time   NA 141 07/08/2020 1422   NA 140 01/30/2019 1330   K 3.7 07/08/2020 1422   CL 101 07/08/2020 1422   CO2 31 07/08/2020 1422   BUN 18 07/08/2020 1422   BUN 18 01/30/2019 1330   CREATININE 1.20 07/08/2020 1422   CREATININE 1.20 (H) 12/21/2019 1336   CREATININE 1.28 (H) 09/21/2016 1115      Component Value Date/Time   CALCIUM 9.5 07/08/2020 1422   ALKPHOS 88 07/08/2020 1422   AST 13 07/08/2020 1422   AST 12 (L) 12/21/2019 1336   ALT 9 07/08/2020 1422   ALT 8 12/21/2019  1336   BILITOT 0.4 07/08/2020 1422   BILITOT 0.4 12/21/2019 1336       RADIOGRAPHIC STUDIES:  MM 3D SCREEN BREAST UNI LEFT  Result Date: 11/29/2020 CLINICAL DATA:  Screening. EXAM: DIGITAL SCREENING UNILATERAL LEFT MAMMOGRAM WITH CAD AND TOMOSYNTHESIS TECHNIQUE: Left screening digital craniocaudal and mediolateral oblique mammograms were obtained. Left screening digital breast tomosynthesis was performed. The images were evaluated with computer-aided detection. COMPARISON:  Previous exam(s). ACR Breast Density Category b: There are scattered areas of fibroglandular density. FINDINGS: The patient has had a right mastectomy. There are no findings suspicious for malignancy. IMPRESSION: No mammographic evidence of malignancy. A result letter of this screening mammogram will be mailed directly to the patient. RECOMMENDATION: Screening mammogram in one year.  (Code:[id number]) BI-RADS CATEGORY  1: Negative. Electronically Signed   By: Lillia Mountain M.D.   On: 11/29/2020 12:18     ASSESSMENT/PLAN:  This is a very pleasant 70 year old African-American female referred to the clinic for evaluation of iron deficiency anemia.   She has been taking Integra +1 capsule p.o. daily.  She also receives IV iron with Venofer weekly x4 as needed. Last dose on 12/06/20  The swelling seemed to be localized to the IV site. No fluctuance or warmth to suggest abscess or infection. No hand swelling or generalized extremity swelling to suggest lymphedema from her prior mastectomy. Localized and superficial swelling so low suspicion for DVT. This is likely infiltrate from her recent venofer infusion. Would recommend that she ice and elevate the area. Discussed she may have some skin discoloration. Also advised to use tylenol for the throbbing pain. Discussed she can continue taking her 81 mg aspirin daily as well. Discussed if she has new or worsening symptoms, to please reach out to [place] and I would consider ultrasound of the  right upper extremity if no improvement in her symptoms with these interventions. If she developed systemic symptoms of symptoms of infection (I.e warmth, worsening swelling, fevers, etc) then we would also need to re-assess for infection.   The patient was advised to call immediately if she has any concerning symptoms in the interval. The patient voices understanding of current disease status and treatment options and is in agreement with the current care plan. All questions were answered. The patient knows to call the clinic with any problems, questions or concerns. We can certainly see the patient much sooner if necessary   No orders of the defined types  were placed in this encounter.    The total time spent in the appointment was 20-29 minutes.   Kahlen Boyde L Doyel Mulkern, PA-C 12/11/20

## 2020-12-11 NOTE — Telephone Encounter (Signed)
Right arm swelling and painful "where they gave me the iron: Schedule message sent and pt aware of appt for today.

## 2020-12-12 ENCOUNTER — Ambulatory Visit
Admission: RE | Admit: 2020-12-12 | Discharge: 2020-12-12 | Disposition: A | Discharge status: Home / Self Care | Payer: 59 | Source: Ambulatory Visit | Attending: Acute Care | Admitting: Acute Care

## 2020-12-12 DIAGNOSIS — Z87891 Personal history of nicotine dependence: Secondary | ICD-10-CM

## 2020-12-13 ENCOUNTER — Telehealth: Payer: Self-pay | Admitting: Acute Care

## 2020-12-13 DIAGNOSIS — Z87891 Personal history of nicotine dependence: Secondary | ICD-10-CM

## 2020-12-13 NOTE — Telephone Encounter (Signed)
Tin from radiology call report:  IMPRESSION: 1. Lung-RADS 2S, benign appearance or behavior. Continue annual screening with low-dose chest CT without contrast in 12 months. 2. Gas within the left renal collecting system, increased. This is of indeterminate clinical significance. Unless there is an explanation for gas in the collecting system (i.e. Foley catheter are or ureteric stent) dedicated pre and post contrast abdominal CT using a hematuria protocol should be considered. 3. Similarly, an upper pole right renal lesion appears to measure greater than fluid density, but is in the region of artifact. Appeared simple on the prior exam. Favored to represent a cyst or complex cyst. Recommend attention on follow-up. 4. Aortic Atherosclerosis (ICD10-I70.0) and Emphysema (ICD10-J43.9). Coronary artery atherosclerosis.   These results will be called to the ordering clinician or representative by the Radiologist Assistant, and communication documented in the PACS or [employer]

## 2020-12-16 ENCOUNTER — Telehealth: Payer: Self-pay | Admitting: Acute Care

## 2020-12-16 NOTE — Telephone Encounter (Signed)
CT results faxed to [name], MD. Order placed for 1 yr f/u low dose ct.

## 2020-12-16 NOTE — Telephone Encounter (Signed)
See telephone note 12/13/20

## 2020-12-16 NOTE — Telephone Encounter (Signed)
I have attempted to call the patient with the results of her low dose Screening CT. There was no answer. I have left a HIPPA compliant message on the VM requesting the patient call the office for the results of her scan.  [name] Cooper, Once we have talked with the patient we can fax results to PCP and schedule follow up 11/2021. She may need some follow up an issue with her kidney, but from a lung cancer screening perspective Lung RADS 2: nodules that are benign in appearance and behavior with a very low likelihood of becoming a clinically active cancer due to size or lack of growth. Recommendation per radiology is for a repeat LDCT in 12 months.  If she calls, Lung RADS 2: nodules that are benign in appearance and behavior with a very low likelihood of becoming a clinically active cancer due to size or lack of growth. Recommendation per radiology is for a repeat LDCT in 12 months.  But I need to ask her a question or two about her kidneys. Thanks

## 2020-12-17 ENCOUNTER — Telehealth: Payer: Self-pay

## 2020-12-17 NOTE — Telephone Encounter (Signed)
Noted.   Nothing further needed from triage standpoint. Will close encounter.

## 2020-12-17 NOTE — Telephone Encounter (Signed)
Error

## 2020-12-23 ENCOUNTER — Other Ambulatory Visit: Payer: Self-pay | Admitting: Family Medicine

## 2020-12-25 NOTE — Progress Notes (Signed)
Please call patient and let them  know their  low dose Ct was read as a Lung RADS 2: nodules that are benign in appearance and behavior with a very low likelihood of becoming a clinically active cancer due to size or lack of growth. Recommendation per radiology is for a repeat LDCT in 12 months. .Please let them  know we will order and schedule their  annual screening scan for 11/2021. Please let them  know there was notation of CAD on their  scan.  Please remind the patient  that this is a non-gated exam therefore degree or severity of disease  cannot be determined. Please have them  follow up with their PCP regarding potential risk factor modification, dietary therapy or pharmacologic therapy if clinically indicated. Pt.  is  currently on statin therapy. Please place order for annual  screening scan for  11/2021 and fax results to PCP. Thanks so much.

## 2020-12-25 NOTE — Progress Notes (Signed)
I have called the patient with the results of her low-dose CT.  I explained that her scan was read as a lung RADS 2 and that she will have a follow-up screening scan in August 2023.  We did discuss the finding of gas in the left renal collecting system.  The patient has had follow-up at the [facility] and plans to have additional follow-up to determine best plan of care regarding that finding.  She denies any abdominal pain or discomfort and she denies any hematuria.  Patient made aware that she will get a call closer to the August 2023 date to schedule her annual screening scan.  Follow-up regarding the gas in the left renal collecting system will be discussed with her PCP at follow-up per the patient today.  Denise please schedule annual screening CT.  Patient states her new PCP is aware of the results of the CT scan.  Thanks so much

## 2021-01-03 ENCOUNTER — Other Ambulatory Visit: Payer: Self-pay | Admitting: Registered Nurse

## 2021-01-03 DIAGNOSIS — N281 Cyst of kidney, acquired: Secondary | ICD-10-CM

## 2021-01-21 ENCOUNTER — Ambulatory Visit (INDEPENDENT_AMBULATORY_CARE_PROVIDER_SITE_OTHER): Payer: 59 | Admitting: Podiatry

## 2021-01-21 ENCOUNTER — Other Ambulatory Visit: Payer: Self-pay

## 2021-01-21 ENCOUNTER — Encounter: Payer: Self-pay | Admitting: Podiatry

## 2021-01-21 DIAGNOSIS — M79674 Pain in right toe(s): Secondary | ICD-10-CM

## 2021-01-21 DIAGNOSIS — E119 Type 2 diabetes mellitus without complications: Secondary | ICD-10-CM

## 2021-01-21 DIAGNOSIS — B351 Tinea unguium: Secondary | ICD-10-CM | POA: Diagnosis not present

## 2021-01-21 DIAGNOSIS — M79675 Pain in left toe(s): Secondary | ICD-10-CM

## 2021-01-22 ENCOUNTER — Ambulatory Visit
Admission: RE | Admit: 2021-01-22 | Discharge: 2021-01-22 | Disposition: A | Discharge status: Home / Self Care | Payer: 59 | Source: Ambulatory Visit | Attending: Registered Nurse | Admitting: Registered Nurse

## 2021-01-22 DIAGNOSIS — N281 Cyst of kidney, acquired: Secondary | ICD-10-CM

## 2021-01-26 NOTE — Progress Notes (Signed)
Subjective: [name] Cooper is a pleasant 70 y.o. female patient seen today for preventative diabetic foot care and painful thick toenails that are difficult to trim. Pain interferes with ambulation. Aggravating factors include wearing enclosed shoe gear. Pain is relieved with periodic professional debridement.   Patient states she does not have to monitor her blood sugar daily.  PCP is [name], NP. Last visit was: two weeks ago.  Allergies  Allergen Reactions   Lisinopril Anaphylaxis   Codeine     REACTION: MAKES HER SLEEPY    Objective: Physical Exam  General: [name] Cooper is a pleasant 70 y.o. African American female,  in NAD. AAO x 3.   Vascular:  Capillary refill time to digits immediate b/l. Palpable DP pulse(s) b/l lower extremities Palpable PT pulse(s) b/l lower extremities Pedal hair sparse. Lower extremity skin temperature gradient within normal limits. No pain with calf compression b/l.   Dermatological:  Skin warm and supple b/l lower extremities. No open wounds b/l lower extremities. No interdigital macerations b/l lower extremities. Toenails 2-5 bilaterally and R hallux elongated, discolored, dystrophic, thickened, and crumbly with subungual debris and tenderness to dorsal palpation.Left hallux nailplate mycotic with adequate length.  Musculoskeletal:  Muscle strength 5/5 to all muscle groups b/l. No pain, crepitus, nor joint limitation noted with ROM b/l. No gross bony deformities b/l lower extremities.  Neurological:  Protective sensation intact with 10 gram monofilament b/l. Vibratory sensation intact b/l. Protective sensation intact 5/5 intact bilaterally with 10g monofilament b/l. Vibratory sensation intact b/l.  Assessment and Plan:  1. Pain due to onychomycosis of toenails of both feet   2. Diabetes mellitus without complication [facility])     Patient was evaluated and treated and all questions answered. Consent given for treatment as described below: -No  new findings. No new orders. -Continue diabetic foot care principles: inspect feet daily, monitor glucose as recommended by PCP and/or Endocrinologist, and follow prescribed diet per PCP, Endocrinologist and/or dietician. -Patient to continue soft, supportive shoe gear daily. -Toenails 2-5 bilaterally and R hallux debrided in length and girth without iatrogenic bleeding with sterile nail nipper and dremel.  -Patient to report any pedal injuries to medical professional immediately. -Patient/POA to call should there be question/concern in the interim.  Return in about 3 months (around 04/23/2021).  Marzetta Board, DPM

## 2021-02-11 ENCOUNTER — Other Ambulatory Visit: Payer: Self-pay

## 2021-02-11 ENCOUNTER — Inpatient Hospital Stay: Payer: 59

## 2021-02-11 ENCOUNTER — Inpatient Hospital Stay: Payer: 59 | Attending: Internal Medicine | Admitting: Internal Medicine

## 2021-02-11 VITALS — BP 126/75 | HR 75 | Temp 97.1°F | Resp 17 | Wt 288.2 lb

## 2021-02-11 DIAGNOSIS — D509 Iron deficiency anemia, unspecified: Secondary | ICD-10-CM | POA: Diagnosis present

## 2021-02-11 DIAGNOSIS — J449 Chronic obstructive pulmonary disease, unspecified: Secondary | ICD-10-CM | POA: Insufficient documentation

## 2021-02-11 LAB — CBC WITH DIFFERENTIAL (CANCER CENTER ONLY)
Abs Immature Granulocytes: 0.02 10*3/uL (ref 0.00–0.07)
Basophils Absolute: 0.1 10*3/uL (ref 0.0–0.1)
Basophils Relative: 1 %
Eosinophils Absolute: 0.5 10*3/uL (ref 0.0–0.5)
Eosinophils Relative: 5 %
HCT: 30 % — ABNORMAL LOW (ref 36.0–46.0)
Hemoglobin: 9 g/dL — ABNORMAL LOW (ref 12.0–15.0)
Immature Granulocytes: 0 %
Lymphocytes Relative: 32 %
Lymphs Abs: 3.2 10*3/uL (ref 0.7–4.0)
MCH: 19.3 pg — ABNORMAL LOW (ref 26.0–34.0)
MCHC: 30 g/dL (ref 30.0–36.0)
MCV: 64.2 fL — ABNORMAL LOW (ref 80.0–100.0)
Monocytes Absolute: 0.9 10*3/uL (ref 0.1–1.0)
Monocytes Relative: 9 %
Neutro Abs: 5.4 10*3/uL (ref 1.7–7.7)
Neutrophils Relative %: 53 %
Platelet Count: 286 10*3/uL (ref 150–400)
RBC: 4.67 MIL/uL (ref 3.87–5.11)
RDW: 21.9 % — ABNORMAL HIGH (ref 11.5–15.5)
WBC Count: 10 10*3/uL (ref 4.0–10.5)
nRBC: 0.2 % (ref 0.0–0.2)

## 2021-02-11 NOTE — Progress Notes (Signed)
[facility] [phone]   [phone]  OFFICE PROGRESS NOTE  [name], NP [address] [place] [address]  DIAGNOSIS: Iron deficiency anemia   PRIOR THERAPY: None   CURRENT THERAPY:  1) IV iron PRN with venofer. Last given on 01/22/20 2) Oral iron supplement with integra plus p.o. daily.    INTERVAL HISTORY: [name] Cooper 70 y.o. female returns to the clinic today for 60-month follow-up visit.  The patient is feeling fine today with no concerning complaints.  The patient is feeling fine today with no concerning complaints except for mild fatigue.  She denied having any chest pain but has shortness of breath at baseline increased with exertion secondary to COPD.  She denied having any chest pain, cough or hemoptysis.  She has no nausea, vomiting, diarrhea or constipation.  She continues to tolerate her treatment with Integra plus fairly well.  The patient is here today for evaluation with repeat CBC, iron study and ferritin.   MEDICAL HISTORY: Past Medical History:  Diagnosis Date   Anemia    Arthritis    Cancer ([address])    breast and ovarian   Chicken pox    Chronic lower back pain    COPD (chronic obstructive pulmonary disease) (HCC)    Diabetes (HCC)    GERD (gastroesophageal reflux disease)    Hepatitis 1970's   History of recurrent UTIs    Hyperlipemia    Hypertension    Slow heart rate dx'd 09/24/2014   Urine incontinence     ALLERGIES:  is allergic to lisinopril and codeine.  MEDICATIONS:  Current Outpatient Medications  Medication Sig Dispense Refill   acetaminophen (TYLENOL) 650 MG CR tablet Take 1,300 mg by mouth every 8 (eight) hours as needed for pain. Reported on 09/23/2015     albuterol (VENTOLIN HFA) 108 (90 Base) MCG/ACT inhaler Inhale 2 puffs into the lungs every 6 (six) hours as needed for wheezing or shortness of breath. 1 each 3   Ascorbic Acid (VITAMIN C) 500 MG CAPS Take 500 mg by mouth 2 (two) times daily.      aspirin EC 81 MG tablet Take 1 tablet (81 mg total) by mouth daily. 90 tablet 3   CRANBERRY EXTRACT PO Take 400 mg by mouth 2 (two) times daily. 2 tablets twice a day     FeFum-FePoly-FA-B Cmp-C-Biot (INTEGRA PLUS) CAPS Take 1 capsule by mouth every morning. 30 capsule 2   hydrALAZINE (APRESOLINE) 25 MG tablet TAKE 1 TABLET BY MOUTH THREE TIMES DAILY 228 tablet 0   isosorbide mononitrate (IMDUR) 30 MG 24 hr tablet Take 1 tablet (30 mg total) by mouth daily. 90 tablet 1   Lactobacillus (ACIDOPHILUS PO) Take 5 mg by mouth daily.     linagliptin (TRADJENTA) 5 MG TABS tablet Take 1 tablet (5 mg total) by mouth daily. 90 tablet 3   Multiple Vitamin (MULTIVITAMIN WITH MINERALS) TABS tablet Take 1 tablet by mouth daily.     nitroGLYCERIN (NITROSTAT) 0.4 MG SL tablet Place 1 tablet (0.4 mg total) under the tongue every 5 (five) minutes as needed for chest pain. 25 tablet prn   rosuvastatin (CRESTOR) 20 MG tablet Take 1 tablet by mouth once daily 21 tablet 1   triamterene-hydrochlorothiazide (MAXZIDE-25) 37.5-25 MG tablet Take 1 tablet by mouth once daily 12 tablet 0   Vitamin D, Ergocalciferol, (DRISDOL) 1.25 MG (50000 UNIT) CAPS capsule One capsule by mouth once a week for 12 weeks. Then take  2000IU/day 12 capsule 0   No current facility-administered medications for this visit.    SURGICAL HISTORY:  Past Surgical History:  Procedure Laterality Date   ABDOMINAL HYSTERECTOMY  1984   APPENDECTOMY     BREAST BIOPSY Right 1996   BREAST LUMPECTOMY Right 1996   LEFT HEART CATH AND CORONARY ANGIOGRAPHY N/A 01/13/2019   Procedure: LEFT HEART CATH AND CORONARY ANGIOGRAPHY;  Surgeon: Nelva Bush, MD;  Location: [facility];  Service: Cardiovascular;  Laterality: N/A;   MASTECTOMY Right 1996   TONSILLECTOMY      REVIEW OF SYSTEMS:  A comprehensive review of systems was negative except for: Constitutional: positive for fatigue Respiratory: positive for dyspnea on exertion   PHYSICAL  EXAMINATION: General appearance: alert, cooperative, fatigued, and no distress Head: Normocephalic, without obvious abnormality, atraumatic Neck: no adenopathy, no JVD, supple, symmetrical, trachea midline, and thyroid not enlarged, symmetric, no tenderness/mass/nodules Lymph nodes: Cervical, supraclavicular, and axillary nodes normal. Resp: clear to auscultation bilaterally Back: symmetric, no curvature. ROM normal. No CVA tenderness. Cardio: regular rate and rhythm, S1, S2 normal, no murmur, click, rub or gallop GI: soft, non-tender; bowel sounds normal; no masses,  no organomegaly Extremities: extremities normal, atraumatic, no cyanosis or edema  ECOG PERFORMANCE STATUS: 1 - Symptomatic but completely ambulatory  Blood pressure 126/75, pulse 75, temperature (!) 97.1 F (36.2 C), temperature source Tympanic, resp. rate 17, weight 288 lb 3.2 oz (130.7 kg), SpO2 99 %.  LABORATORY DATA: Lab Results  Component Value Date   WBC 10.0 02/11/2021   HGB 9.0 (L) 02/11/2021   HCT 30.0 (L) 02/11/2021   MCV 64.2 (L) 02/11/2021   PLT 286 02/11/2021      Chemistry      Component Value Date/Time   NA 141 07/08/2020 1422   NA 140 01/30/2019 1330   K 3.7 07/08/2020 1422   CL 101 07/08/2020 1422   CO2 31 07/08/2020 1422   BUN 18 07/08/2020 1422   BUN 18 01/30/2019 1330   CREATININE 1.20 07/08/2020 1422   CREATININE 1.20 (H) 12/21/2019 1336   CREATININE 1.28 (H) 09/21/2016 1115      Component Value Date/Time   CALCIUM 9.5 07/08/2020 1422   ALKPHOS 88 07/08/2020 1422   AST 13 07/08/2020 1422   AST 12 (L) 12/21/2019 1336   ALT 9 07/08/2020 1422   ALT 8 12/21/2019 1336   BILITOT 0.4 07/08/2020 1422   BILITOT 0.4 12/21/2019 1336       RADIOGRAPHIC STUDIES: CT ABDOMEN WO CONTRAST  Result Date: 01/24/2021 CLINICAL DATA:  RIGHT renal lesion EXAM: CT ABDOMEN WITHOUT CONTRAST TECHNIQUE: Multidetector CT imaging of the abdomen was performed following the standard protocol without IV  contrast. COMPARISON:  None. FINDINGS: Lower chest:  Lung bases are clear. Hepatobiliary: No focal hepatic lesion.  Gallbladder normal. Pancreas: Normal pancreatic parenchymal intensity. No ductal dilatation or inflammation. Spleen: Normal spleen. Adrenals/urinary tract: Adrenal glands normal. Exophytic cyst lateral from the RIGHT mid kidney measures 2.2 cm. The cyst is predominantly homogeneous low-attenuation around 20 Hounsfield units. Small amount gas in the collecting system of the LEFT renal hilum (image 22/5) decreased from recent CT (image 22/2) No nephrolithiasis or ureterolithiasis. Stomach/Bowel: Stomach and limited of the small bowel is unremarkable Vascular/Lymphatic: Abdominal aortic normal caliber. No retroperitoneal periportal lymphadenopathy. Musculoskeletal: No aggressive osseous lesion IMPRESSION: 1. Benign cyst of the RIGHT kidney. 2. Decrease in gas in the LEFT renal collecting system Electronically Signed   By: Suzy Bouchard M.D.   On: 01/24/2021  09:24    ASSESSMENT AND PLAN: This is a very pleasant 70 years old African-American female with iron deficiency anemia status post Venofer infusion in October 2021.   The patient is status post iron infusion with Venofer completed last year and tolerated it fairly well.  She has been on oral iron tablet with Integra +1 capsule p.o. daily. The patient continues to have mild fatigue and shortness of breath with exertion. Repeat CBC today showed decrease of her hemoglobin down to 9.0 and hematocrit 30.0%.  Iron study and ferritin are still pending. I recommended for the patient to continue on the Integra +1 capsule p.o. daily but I will also arrange for the patient to receive iron infusion with Venofer 300 mg IV weekly for 3 weeks. The patient will come back for follow-up visit in 2 months for evaluation and repeat blood work. She was advised to call immediately if she has any other concerning symptoms in the interval. The patient voices  understanding of current disease status and treatment options and is in agreement with the current care plan.  All questions were answered. The patient knows to call the clinic with any problems, questions or concerns. We can certainly see the patient much sooner if necessary.  Disclaimer: This note was dictated with voice recognition software. Similar sounding words can inadvertently be transcribed and may not be corrected upon review.

## 2021-02-12 ENCOUNTER — Telehealth: Payer: Self-pay | Admitting: Internal Medicine

## 2021-02-12 LAB — FERRITIN: Ferritin: 7 ng/mL — ABNORMAL LOW (ref 11–307)

## 2021-02-12 LAB — IRON AND TIBC
Iron: 18 ug/dL — ABNORMAL LOW (ref 41–142)
Saturation Ratios: 6 % — ABNORMAL LOW (ref 21–57)
TIBC: 329 ug/dL (ref 236–444)
UIBC: 310 ug/dL (ref 120–384)

## 2021-02-12 NOTE — Telephone Encounter (Signed)
Scheduled follow-up appointment per 10/25 los. Patient is aware.

## 2021-03-03 ENCOUNTER — Other Ambulatory Visit: Payer: Self-pay

## 2021-03-03 ENCOUNTER — Encounter: Payer: Self-pay | Admitting: Cardiology

## 2021-03-03 ENCOUNTER — Ambulatory Visit (INDEPENDENT_AMBULATORY_CARE_PROVIDER_SITE_OTHER): Payer: 59 | Admitting: Cardiology

## 2021-03-03 VITALS — BP 100/60 | HR 59 | Ht 66.0 in | Wt 287.0 lb

## 2021-03-03 DIAGNOSIS — Z87891 Personal history of nicotine dependence: Secondary | ICD-10-CM

## 2021-03-03 DIAGNOSIS — I25119 Atherosclerotic heart disease of native coronary artery with unspecified angina pectoris: Secondary | ICD-10-CM | POA: Diagnosis not present

## 2021-03-03 DIAGNOSIS — E782 Mixed hyperlipidemia: Secondary | ICD-10-CM

## 2021-03-03 DIAGNOSIS — Z79899 Other long term (current) drug therapy: Secondary | ICD-10-CM | POA: Diagnosis not present

## 2021-03-03 DIAGNOSIS — J438 Other emphysema: Secondary | ICD-10-CM

## 2021-03-03 DIAGNOSIS — E7800 Pure hypercholesterolemia, unspecified: Secondary | ICD-10-CM

## 2021-03-03 DIAGNOSIS — E119 Type 2 diabetes mellitus without complications: Secondary | ICD-10-CM

## 2021-03-03 DIAGNOSIS — I2510 Atherosclerotic heart disease of native coronary artery without angina pectoris: Secondary | ICD-10-CM | POA: Diagnosis not present

## 2021-03-03 MED ORDER — EZETIMIBE 10 MG PO TABS: 10.0000 mg | ORAL_TABLET | Freq: Every day | ORAL | 3 refills | Status: AC

## 2021-03-03 NOTE — Assessment & Plan Note (Signed)
Continue to encourage weight loss, decrease carbohydrates.

## 2021-03-03 NOTE — Assessment & Plan Note (Signed)
Excellent, stopped in October 2020.

## 2021-03-03 NOTE — Assessment & Plan Note (Signed)
On Crestor 20 mg.  Current LDL 80.  Goal less than 70.  Lets go ahead and add Zetia 10 mg.  Check lipid panel and ALT in 3 months.  No myalgias.

## 2021-03-03 NOTE — Progress Notes (Signed)
Cardiology Office Note:    Date:  03/03/2021   ID:  [name] Cooper, DOB 30-May-1950, MRN [id number]  PCP:  [name], NP  [facility] Cardiologist:  Candee Furbish, MD  [facility] Electrophysiologist:  None   Referring MD: [name], MD    History of Present Illness:    [name] Cooper is a 70 y.o. female here for the follow-up of coronary artery disease post catheterization with 80% LAD lesion that was treated medically since she was not having any significant anginal symptoms.  Atherosclerosis/coronary calcification seen on CT scan and LAD and circumflex distribution as well as mild mid to distal RCA.  Has had shortness of breath related to tobacco use and COPD.  Followed by pulmonary medicine as well.   No early family history of coronary artery disease.  She has quit smoking with nicotine patch.    Today, she has been doing good. She endorses shortness of breath but attributes this to her COPD. She has been doing well with quitting smoking. She is compliant and tolerating her prescriptions. Otherwise, she has no major concerns.  She denies any palpitations or chest pain. No lightheadedness, headaches, syncope, orthopnea, PND, lower extremity edema or exertional symptoms.   Past Medical History:  Diagnosis Date   Anemia    Arthritis    Cancer ([place])    breast and ovarian   Chicken pox    Chronic lower back pain    COPD (chronic obstructive pulmonary disease) (HCC)    Diabetes (HCC)    GERD (gastroesophageal reflux disease)    Hepatitis 1970's   History of recurrent UTIs    Hyperlipemia    Hypertension    Slow heart rate dx'd 09/24/2014   Urine incontinence     Past Surgical History:  Procedure Laterality Date   ABDOMINAL HYSTERECTOMY  1984   APPENDECTOMY     BREAST BIOPSY Right 1996   BREAST LUMPECTOMY Right 1996   LEFT HEART CATH AND CORONARY ANGIOGRAPHY N/A 01/13/2019   Procedure: LEFT HEART CATH AND CORONARY ANGIOGRAPHY;  Surgeon: Nelva Bush,  MD;  Location: [place] [facility];  Service: Cardiovascular;  Laterality: N/A;   MASTECTOMY Right 1996   TONSILLECTOMY      Current Medications: Current Meds  Medication Sig   acetaminophen (TYLENOL) 650 MG CR tablet Take 1,300 mg by mouth every 8 (eight) hours as needed for pain. Reported on 09/23/2015   albuterol (VENTOLIN HFA) 108 (90 Base) MCG/ACT inhaler Inhale 2 puffs into the lungs every 6 (six) hours as needed for wheezing or shortness of breath.   Ascorbic Acid (VITAMIN C) 500 MG CAPS Take 500 mg by mouth 2 (two) times daily.   aspirin EC 81 MG tablet Take 1 tablet (81 mg total) by mouth daily.   CRANBERRY EXTRACT PO Take 400 mg by mouth 2 (two) times daily. 2 tablets twice a day   ezetimibe (ZETIA) 10 MG tablet Take 1 tablet (10 mg total) by mouth daily.   FeFum-FePoly-FA-B Cmp-C-Biot (INTEGRA PLUS) CAPS Take 1 capsule by mouth every morning.   hydrALAZINE (APRESOLINE) 25 MG tablet TAKE 1 TABLET BY MOUTH THREE TIMES DAILY   isosorbide mononitrate (IMDUR) 30 MG 24 hr tablet Take 1 tablet (30 mg total) by mouth daily.   Lactobacillus (ACIDOPHILUS PO) Take 5 mg by mouth daily.   linagliptin (TRADJENTA) 5 MG TABS tablet Take 1 tablet (5 mg total) by mouth daily.   Multiple Vitamin (MULTIVITAMIN WITH MINERALS) TABS tablet Take 1 tablet by mouth daily.  nitroGLYCERIN (NITROSTAT) 0.4 MG SL tablet Place 1 tablet (0.4 mg total) under the tongue every 5 (five) minutes as needed for chest pain.   rosuvastatin (CRESTOR) 20 MG tablet Take 1 tablet by mouth once daily   triamterene-hydrochlorothiazide (MAXZIDE-25) 37.5-25 MG tablet Take 1 tablet by mouth once daily     Allergies:   Lisinopril and Codeine   Social History   Socioeconomic History   Marital status: Widowed    Spouse name: Not on file   Number of children: 1   Years of education: Not on file   Highest education level: Not on file  Occupational History   Occupation: Retired     Fish farm manager: [employer]  Tobacco Use    Smoking status: Every Day    Packs/day: 0.25    Years: 47.00    Pack years: 11.75    Types: Cigarettes   Smokeless tobacco: Never   Tobacco comments:    quit date: 01/19/2019  Vaping Use   Vaping Use: Never used  Substance and Sexual Activity   Alcohol use: No    Alcohol/week: 0.0 standard drinks   Drug use: No    Types: "Crack" cocaine    Comment: 09/24/2014 "last crack was in 2013"   Sexual activity: Not on file  Other Topics Concern   Not on file  Social History Narrative   3 grand children    9 great grandchildren    Social Determinants of Health   Financial Resource Strain: Low Risk    Difficulty of Paying Living Expenses: Not hard at all  Food Insecurity: No Food Insecurity   Worried About Charity fundraiser in the Last Year: Never true   [place] in the Last Year: Never true  Transportation Needs: No Transportation Needs   Lack of Transportation (Medical): No   Lack of Transportation (Non-Medical): No  Physical Activity: Inactive   Days of Exercise per Week: 0 days   Minutes of Exercise per Session: 0 min  Stress: No Stress Concern Present   Feeling of Stress : Not at all  Social Connections: Socially Isolated   Frequency of Communication with Friends and Family: More than three times a week   Frequency of Social Gatherings with Friends and Family: Once a week   Attends Religious Services: Never   Marine scientist or Organizations: No   Attends Archivist Meetings: Never   Marital Status: Widowed     Family History: The patient's family history includes Arthritis in her brother; Cancer in her mother; Early death in her mother. There is no history of Colon cancer, Esophageal cancer, Rectal cancer, or Stomach cancer.  ROS:   Please see the history of present illness.    (+) Shortness of breath  All other systems reviewed and are negative.  EKGs/Labs/Other Studies Reviewed:    The following studies were reviewed today:  Cath  01/13/19: Significant single vessel coronary artery disease with moderately to severely calcified 80% mid LAD stenosis. Mild, non-obstructive coronary artery disease involving the LCx and RCA. Normal left ventricular systolic function and filling pressure.  Recommendations: Given lack of chest pain (though dyspnea on exertion may be anginal equivalent) and no ongoing antianginal therapy, I recommend trial of isosorbide mononitrate.  If Ms. Dome has persistent symptoms despite optimal medical therapy at follow-up, PCI to mid LAD with orbital atherectomy will need to be considered. Aggressive secondary prevention.   Diagnostic  Dominance: Right  Monitor 10/06/16 PVC rare (23) PAC  rare (259) Occasional paroxysmal atrial tachycardia Rare slow successive ventricular beats (4 beats) at 92 BPM Occasional bradycardia when appropriate (6am) Palpitations are related to paroxysmal atrial tachycardia (benign)   EKG: EKG was personally reviewed 03/03/21: Sinus rhythm, rate 59 bpm; Non-specfic T-Wave changes  Recent Labs: 07/08/2020: ALT 9; BUN 18; Creatinine, Ser 1.20; Potassium 3.7; Sodium 141; TSH 2.44 02/11/2021: Hemoglobin 9.0; Platelet Count 286  Recent Lipid Panel    Component Value Date/Time   CHOL 163 07/08/2020 1422   CHOL 153 05/15/2019 1200   TRIG 101.0 07/08/2020 1422   HDL 62.50 07/08/2020 1422   HDL 67 05/15/2019 1200   CHOLHDL 3 07/08/2020 1422   VLDL 20.2 07/08/2020 1422   LDLCALC 80 07/08/2020 1422   LDLCALC 69 05/15/2019 1200   LDLDIRECT 159.0 06/07/2018 1446     Risk Assessment/Calculations:       Physical Exam:    VS:  BP 100/60 (BP Location: Left Arm, Patient Position: Sitting, Cuff Size: Large)   Pulse (!) 59   Ht 5\' 6"  (1.676 m)   Wt 287 lb (130.2 kg)   LMP  (LMP Unknown)   BMI 46.32 kg/m     Wt Readings from Last 3 Encounters:  03/03/21 287 lb (130.2 kg)  02/11/21 288 lb 3.2 oz (130.7 kg)  12/11/20 292 lb 6 oz (132.6 kg)     GEN:  Well  nourished, well developed in no acute distress HEENT: Normal NECK: No JVD; No carotid bruits LYMPHATICS: No lymphadenopathy CARDIAC: RRR, no murmurs, rubs, gallops RESPIRATORY:  Clear to auscultation without rales, wheezing or rhonchi  ABDOMEN: Soft, non-tender, non-distended MUSCULOSKELETAL:  No edema; No deformity  SKIN: Warm and dry NEUROLOGIC:  Alert and oriented x 3 PSYCHIATRIC:  Normal affect   ASSESSMENT:    1. Atherosclerosis of native coronary artery of native heart without angina pectoris   2. Mixed hyperlipidemia   3. Medication management   4. Coronary artery disease involving native coronary artery of native heart with angina pectoris ([place])   5. Pure hypercholesterolemia   6. Other emphysema ([place])   7. Diabetes mellitus without complication ([place])   8. Former smoker   16. Morbid obesity ([place])     PLAN:    Coronary artery disease involving native coronary artery of native heart with angina pectoris (HCC) Previously densely calcified LAD lesion 80%.  Isosorbide.  Doing well.  No anginal symptoms.  She was treated medically.  Ischemia trial.  I would like to see her LDL lower however.  I will add Zetia 10 mg to her Crestor 20.  Continue with aspirin 81 mg.  Pure hypercholesterolemia On Crestor 20 mg.  Current LDL 80.  Goal less than 70.  Lets go ahead and add Zetia 10 mg.  Check lipid panel and ALT in 3 months.  No myalgias.  Emphysema of lung ([place]) No active wheezing today.  She quit smoking a while back.  I asked her how she did this and she stated that she just stopped buying them.  Excellent.  Diabetes mellitus without complication (HCC) Hemoglobin A1c 6.5.  Linagliptin 5 mg a day.  Per primary provider.  Excellent.  Former smoker Excellent, stopped in October 2020.  Morbid obesity ([place]) Continue to encourage weight loss, decrease carbohydrates.  Shared Decision Making/Informed Consent        Medication Adjustments/Labs and Tests Ordered: Current  medicines are reviewed at length with the patient today.  Concerns regarding medicines are outlined above.  Orders Placed This Encounter  Procedures   ALT   Lipid panel   EKG 12-Lead    Meds ordered this encounter  Medications   ezetimibe (ZETIA) 10 MG tablet    Sig: Take 1 tablet (10 mg total) by mouth daily.    Dispense:  90 tablet    Refill:  3     Patient Instructions  Medication Instructions:  Please start Zetia 10 mg a day. Continue all other medications as listed.  *If you need a refill on your cardiac medications before your next appointment, please call your pharmacy*  Lab Work: Please have blood work in 3 months (Lipid/ALT)  If you have labs (blood work) drawn today and your tests are completely normal, you will receive your results only by:  (if you have MyChart) OR A paper copy in the mail If you have any lab test that is abnormal or we need to change your treatment, we will call you to review the results.  Follow-Up: At [facility], you and your health needs are our priority.  As part of our continuing mission to provide you with exceptional heart care, we have created designated Provider Care Teams.  These Care Teams include your primary Cardiologist (physician) and Advanced Practice Providers (APPs -  Physician Assistants and Nurse Practitioners) who all work together to provide you with the care you need, when you need it.  We recommend signing up for the patient portal called "MyChart".  Sign up information is provided on this After Visit Summary.  MyChart is used to connect with patients for Virtual Visits (Telemedicine).  Patients are able to view lab/test results, encounter notes, upcoming appointments, etc.  Non-urgent messages can be sent to your provider as well.   To learn more about what you can do with MyChart, go to [email or web address].    Your next appointment:   1 year(s)  The format for your next appointment:   In  Person  Provider:   Candee Furbish, MD    Thank you for choosing [facility]!!     Wilhemina Bonito as a scribe for Candee Furbish, MD.,have documented all relevant documentation on the behalf of Candee Furbish, MD,as directed by  Candee Furbish, MD while in the presence of Candee Furbish, MD.  I, Candee Furbish, MD, have reviewed all documentation for this visit. The documentation on 03/03/21 for the exam, diagnosis, procedures, and orders are all accurate and complete.   Signed, Candee Furbish, MD  03/03/2021 1:51 PM    [place]

## 2021-03-03 NOTE — Patient Instructions (Signed)
Medication Instructions:  Please start Zetia 10 mg a day. Continue all other medications as listed.  *If you need a refill on your cardiac medications before your next appointment, please call your pharmacy*  Lab Work: Please have blood work in 3 months (Lipid/ALT)  If you have labs (blood work) drawn today and your tests are completely normal, you will receive your results only by: [place] (if you have MyChart) OR A paper copy in the mail If you have any lab test that is abnormal or we need to change your treatment, we will call you to review the results.  Follow-Up: At [facility], you and your health needs are our priority.  As part of our continuing mission to provide you with exceptional heart care, we have created designated Provider Care Teams.  These Care Teams include your primary Cardiologist (physician) and Advanced Practice Providers (APPs -  Physician Assistants and Nurse Practitioners) who all work together to provide you with the care you need, when you need it.  We recommend signing up for the patient portal called "MyChart".  Sign up information is provided on this After Visit Summary.  MyChart is used to connect with patients for Virtual Visits (Telemedicine).  Patients are able to view lab/test results, encounter notes, upcoming appointments, etc.  Non-urgent messages can be sent to your provider as well.   To learn more about what you can do with MyChart, go to [email or web address].    Your next appointment:   1 year(s)  The format for your next appointment:   In Person  Provider:   Candee Furbish, MD    Thank you for choosing [facility]!!

## 2021-03-03 NOTE — Assessment & Plan Note (Signed)
Hemoglobin A1c 6.5.  Linagliptin 5 mg a day.  Per primary provider.  Excellent.

## 2021-03-03 NOTE — Assessment & Plan Note (Signed)
Previously densely calcified LAD lesion 80%.  Isosorbide.  Doing well.  No anginal symptoms.  She was treated medically.  Ischemia trial.  I would like to see her LDL lower however.  I will add Zetia 10 mg to her Crestor 20.  Continue with aspirin 81 mg.

## 2021-03-03 NOTE — Assessment & Plan Note (Signed)
No active wheezing today.  She quit smoking a while back.  I asked her how she did this and she stated that she just stopped buying them.  Excellent.

## 2021-03-05 ENCOUNTER — Telehealth: Payer: Self-pay | Admitting: Internal Medicine

## 2021-03-05 NOTE — Telephone Encounter (Signed)
R/s per provider ok , pt aware

## 2021-03-06 ENCOUNTER — Telehealth: Payer: Self-pay | Admitting: Pharmacy Technician

## 2021-03-06 ENCOUNTER — Other Ambulatory Visit: Payer: Self-pay | Admitting: Internal Medicine

## 2021-03-06 ENCOUNTER — Other Ambulatory Visit: Payer: Self-pay | Admitting: Pharmacy Technician

## 2021-03-06 NOTE — Telephone Encounter (Signed)
Dr. Julien Nordmann,  Juluis Rainier note:  Auth Submission: NO AUTH NEEDED Payer: UHC/MEDICARE Medication & CPT/J Code(s) submitted: Venofer (Iron Sucrose) J1756 Route of submission (phone, fax, portal): PHONE Auth type: Buy/Bill Units/visits requested: 3 Reference number: [id number] APPROVAL: 03/06/21 - 05/21/21   Patient will be scheduled as soon as possible.

## 2021-03-10 ENCOUNTER — Other Ambulatory Visit: Payer: Self-pay

## 2021-03-10 ENCOUNTER — Ambulatory Visit (INDEPENDENT_AMBULATORY_CARE_PROVIDER_SITE_OTHER): Payer: 59

## 2021-03-10 VITALS — BP 106/67 | HR 61 | Temp 97.8°F | Resp 16 | Ht 66.0 in | Wt 288.0 lb

## 2021-03-10 DIAGNOSIS — D500 Iron deficiency anemia secondary to blood loss (chronic): Secondary | ICD-10-CM

## 2021-03-10 MED ORDER — ACETAMINOPHEN 325 MG PO TABS
650.0000 mg | ORAL_TABLET | Freq: Once | ORAL | Status: AC
  Administered 2021-03-10: 650 mg via ORAL
  Filled 2021-03-10: qty 2

## 2021-03-10 MED ORDER — SODIUM CHLORIDE 0.9 % IV SOLN: Freq: Once | INTRAVENOUS | Status: DC | PRN

## 2021-03-10 MED ORDER — DIPHENHYDRAMINE HCL 25 MG PO CAPS
25.0000 mg | ORAL_CAPSULE | Freq: Once | ORAL | Status: AC
  Administered 2021-03-10: 25 mg via ORAL
  Filled 2021-03-10: qty 1

## 2021-03-10 MED ORDER — METHYLPREDNISOLONE SODIUM SUCC 125 MG IJ SOLR: 125.0000 mg | Freq: Once | INTRAMUSCULAR | Status: DC | PRN

## 2021-03-10 MED ORDER — ALBUTEROL SULFATE HFA 108 (90 BASE) MCG/ACT IN AERS: 2.0000 | INHALATION_SPRAY | Freq: Once | RESPIRATORY_TRACT | Status: DC | PRN

## 2021-03-10 MED ORDER — FAMOTIDINE IN NACL 20-0.9 MG/50ML-% IV SOLN: 20.0000 mg | Freq: Once | INTRAVENOUS | Status: DC | PRN

## 2021-03-10 MED ORDER — EPINEPHRINE 0.3 MG/0.3ML IJ SOAJ: 0.3000 mg | Freq: Once | INTRAMUSCULAR | Status: DC | PRN

## 2021-03-10 MED ORDER — DIPHENHYDRAMINE HCL 50 MG/ML IJ SOLN: 50.0000 mg | Freq: Once | INTRAMUSCULAR | Status: DC | PRN

## 2021-03-10 MED ORDER — SODIUM CHLORIDE 0.9 % IV SOLN
300.0000 mg | INTRAVENOUS | Status: DC
  Administered 2021-03-10: 300 mg via INTRAVENOUS
  Filled 2021-03-10: qty 15

## 2021-03-10 NOTE — Progress Notes (Signed)
Diagnosis: Iron Deficiency Anemia  Provider:  Marshell Garfinkel, MD  Procedure: Infusion  IV Type: Peripheral, IV Location: R Antecubital  Feraheme (Ferumoxytol), Dose: 300 mg  Infusion Start Time: 8257  Infusion Stop Time: 1150  Post Infusion IV Care: observation declined by pt. IV discontinued   Discharge: Condition: Good, Destination: Home . AVS provided to patient.   Performed by:  Zeric Baranowski, Sherlon Handing, LPN

## 2021-03-17 ENCOUNTER — Other Ambulatory Visit: Payer: Self-pay

## 2021-03-17 ENCOUNTER — Ambulatory Visit (INDEPENDENT_AMBULATORY_CARE_PROVIDER_SITE_OTHER): Payer: 59

## 2021-03-17 VITALS — BP 98/62 | HR 59 | Temp 97.8°F | Resp 16 | Ht 66.0 in | Wt 287.8 lb

## 2021-03-17 DIAGNOSIS — D500 Iron deficiency anemia secondary to blood loss (chronic): Secondary | ICD-10-CM

## 2021-03-17 MED ORDER — SODIUM CHLORIDE 0.9 % IV SOLN: Freq: Once | INTRAVENOUS | Status: DC | PRN

## 2021-03-17 MED ORDER — METHYLPREDNISOLONE SODIUM SUCC 125 MG IJ SOLR: 125.0000 mg | Freq: Once | INTRAMUSCULAR | Status: DC | PRN

## 2021-03-17 MED ORDER — ALBUTEROL SULFATE HFA 108 (90 BASE) MCG/ACT IN AERS: 2.0000 | INHALATION_SPRAY | Freq: Once | RESPIRATORY_TRACT | Status: DC | PRN

## 2021-03-17 MED ORDER — DIPHENHYDRAMINE HCL 50 MG/ML IJ SOLN: 50.0000 mg | Freq: Once | INTRAMUSCULAR | Status: DC | PRN

## 2021-03-17 MED ORDER — DIPHENHYDRAMINE HCL 25 MG PO CAPS
25.0000 mg | ORAL_CAPSULE | Freq: Once | ORAL | Status: AC
  Administered 2021-03-17: 09:00:00 25 mg via ORAL
  Filled 2021-03-17: qty 1

## 2021-03-17 MED ORDER — EPINEPHRINE 0.3 MG/0.3ML IJ SOAJ: 0.3000 mg | Freq: Once | INTRAMUSCULAR | Status: DC | PRN

## 2021-03-17 MED ORDER — ACETAMINOPHEN 325 MG PO TABS
650.0000 mg | ORAL_TABLET | Freq: Once | ORAL | Status: AC
  Administered 2021-03-17: 09:00:00 650 mg via ORAL
  Filled 2021-03-17: qty 2

## 2021-03-17 MED ORDER — SODIUM CHLORIDE 0.9 % IV SOLN
300.0000 mg | INTRAVENOUS | Status: DC
  Administered 2021-03-17: 09:00:00 300 mg via INTRAVENOUS
  Filled 2021-03-17: qty 15

## 2021-03-17 MED ORDER — FAMOTIDINE IN NACL 20-0.9 MG/50ML-% IV SOLN: 20.0000 mg | Freq: Once | INTRAVENOUS | Status: DC | PRN

## 2021-03-17 NOTE — Progress Notes (Addendum)
Diagnosis: Iron Deficiency Anemia  Provider:  Marshell Garfinkel, MD  Procedure: Infusion  IV Type: Peripheral, IV Location: R Antecubital  Venofer (Iron Sucrose), Dose: 300 mg  Infusion Start Time: 0910am  Infusion Stop Time: 1115am  Post Infusion IV Care: Peripheral IV Discontinued  Discharge: Condition: Good, Destination: Home . AVS provided to patient.   Performed by:  Koren Shiver, RN

## 2021-03-22 ENCOUNTER — Other Ambulatory Visit: Payer: Self-pay | Admitting: Cardiology

## 2021-03-22 ENCOUNTER — Other Ambulatory Visit: Payer: Self-pay | Admitting: Family Medicine

## 2021-03-24 ENCOUNTER — Ambulatory Visit (INDEPENDENT_AMBULATORY_CARE_PROVIDER_SITE_OTHER): Payer: 59

## 2021-03-24 ENCOUNTER — Other Ambulatory Visit: Payer: Self-pay

## 2021-03-24 VITALS — BP 115/73 | HR 53 | Temp 98.0°F | Resp 16 | Ht 66.0 in | Wt 287.0 lb

## 2021-03-24 DIAGNOSIS — D500 Iron deficiency anemia secondary to blood loss (chronic): Secondary | ICD-10-CM

## 2021-03-24 MED ORDER — ACETAMINOPHEN 325 MG PO TABS
650.0000 mg | ORAL_TABLET | Freq: Once | ORAL | Status: AC
  Administered 2021-03-24: 650 mg via ORAL
  Filled 2021-03-24: qty 2

## 2021-03-24 MED ORDER — DIPHENHYDRAMINE HCL 25 MG PO CAPS
25.0000 mg | ORAL_CAPSULE | Freq: Once | ORAL | Status: AC
  Administered 2021-03-24: 25 mg via ORAL
  Filled 2021-03-24: qty 1

## 2021-03-24 MED ORDER — SODIUM CHLORIDE 0.9 % IV SOLN
300.0000 mg | INTRAVENOUS | Status: DC
  Administered 2021-03-24: 300 mg via INTRAVENOUS
  Filled 2021-03-24: qty 15

## 2021-03-24 NOTE — Progress Notes (Signed)
Diagnosis: Iron Deficiency Anemia  Provider:  Marshell Garfinkel, MD  Procedure: Infusion  IV Type: Peripheral, IV Location: L Forearm  Venofer (Iron Sucrose), Dose: 300 mg  Infusion Start Time: 1014am  Infusion Stop Time: 1203pm  Post Infusion IV Care: Peripheral IV Discontinued  Discharge: Condition: Good, Destination: Home . AVS provided to patient.   Performed by:  Koren Shiver, RN

## 2021-04-08 ENCOUNTER — Other Ambulatory Visit: Payer: Self-pay | Admitting: *Deleted

## 2021-04-08 ENCOUNTER — Other Ambulatory Visit: Payer: Self-pay

## 2021-04-08 ENCOUNTER — Inpatient Hospital Stay: Payer: 59 | Attending: Internal Medicine

## 2021-04-08 ENCOUNTER — Inpatient Hospital Stay (HOSPITAL_BASED_OUTPATIENT_CLINIC_OR_DEPARTMENT_OTHER): Payer: 59 | Admitting: Internal Medicine

## 2021-04-08 VITALS — BP 132/69 | HR 71 | Temp 97.1°F | Resp 20 | Ht 66.0 in | Wt 284.7 lb

## 2021-04-08 DIAGNOSIS — E785 Hyperlipidemia, unspecified: Secondary | ICD-10-CM | POA: Diagnosis not present

## 2021-04-08 DIAGNOSIS — D509 Iron deficiency anemia, unspecified: Secondary | ICD-10-CM | POA: Diagnosis present

## 2021-04-08 DIAGNOSIS — E119 Type 2 diabetes mellitus without complications: Secondary | ICD-10-CM | POA: Insufficient documentation

## 2021-04-08 DIAGNOSIS — I10 Essential (primary) hypertension: Secondary | ICD-10-CM | POA: Insufficient documentation

## 2021-04-08 LAB — FERRITIN: Ferritin: 87 ng/mL (ref 11–307)

## 2021-04-08 LAB — CBC WITH DIFFERENTIAL (CANCER CENTER ONLY)
Abs Immature Granulocytes: 0.01 10*3/uL (ref 0.00–0.07)
Basophils Absolute: 0.1 10*3/uL (ref 0.0–0.1)
Basophils Relative: 1 %
Eosinophils Absolute: 0.5 10*3/uL (ref 0.0–0.5)
Eosinophils Relative: 5 %
HCT: 31.3 % — ABNORMAL LOW (ref 36.0–46.0)
Hemoglobin: 8.9 g/dL — ABNORMAL LOW (ref 12.0–15.0)
Immature Granulocytes: 0 %
Lymphocytes Relative: 37 %
Lymphs Abs: 3.3 10*3/uL (ref 0.7–4.0)
MCH: 19.7 pg — ABNORMAL LOW (ref 26.0–34.0)
MCHC: 28.4 g/dL — ABNORMAL LOW (ref 30.0–36.0)
MCV: 69.2 fL — ABNORMAL LOW (ref 80.0–100.0)
Monocytes Absolute: 0.6 10*3/uL (ref 0.1–1.0)
Monocytes Relative: 7 %
Neutro Abs: 4.4 10*3/uL (ref 1.7–7.7)
Neutrophils Relative %: 50 %
Platelet Count: 285 10*3/uL (ref 150–400)
RBC: 4.52 MIL/uL (ref 3.87–5.11)
RDW: 27.2 % — ABNORMAL HIGH (ref 11.5–15.5)
WBC Count: 8.9 10*3/uL (ref 4.0–10.5)
nRBC: 0 % (ref 0.0–0.2)

## 2021-04-08 LAB — IRON AND IRON BINDING CAPACITY (CC-WL,HP ONLY)
Iron: 173 ug/dL — ABNORMAL HIGH (ref 28–170)
Saturation Ratios: 53 % — ABNORMAL HIGH (ref 10.4–31.8)
TIBC: 325 ug/dL (ref 250–450)
UIBC: 152 ug/dL (ref 148–442)

## 2021-04-08 LAB — RETICULOCYTES
Immature Retic Fract: 43.7 % — ABNORMAL HIGH (ref 2.3–15.9)
RBC.: 4.49 MIL/uL (ref 3.87–5.11)
Retic Count, Absolute: 155.4 10*3/uL (ref 19.0–186.0)
Retic Ct Pct: 3.5 % — ABNORMAL HIGH (ref 0.4–3.1)

## 2021-04-08 LAB — VITAMIN B12: Vitamin B-12: 343 pg/mL (ref 180–914)

## 2021-04-08 LAB — LACTATE DEHYDROGENASE: LDH: 191 U/L (ref 98–192)

## 2021-04-08 NOTE — Progress Notes (Signed)
[place] [phone]   [phone]  OFFICE PROGRESS NOTE  [name], NP [place] [address]  DIAGNOSIS: Iron deficiency anemia   PRIOR THERAPY: None   CURRENT THERAPY:  1) IV iron PRN with venofer 300 mg weekly for 3 weeks at a time.  Last dose was [place] 2 weeks ago. 2) Oral iron supplement with integra plus p.o. daily.    INTERVAL HISTORY: [name] Cooper 70 y.o. female returns to the clinic today for follow-up visit.  The patient is feeling a little bit better after receiving iron infusion with Venofer.  She continues to complain of fatigue.  She denied having any current chest pain, shortness of breath, cough or hemoptysis.  She denied having any fever or chills.  She has no nausea, vomiting, diarrhea or constipation.  She has no headache or visual changes.  She is here today for evaluation and repeat blood work.  MEDICAL HISTORY: Past Medical History:  Diagnosis Date   Anemia    Arthritis    Cancer ([place])    breast and ovarian   Chicken pox    Chronic lower back pain    COPD (chronic obstructive pulmonary disease) (HCC)    Diabetes (HCC)    GERD (gastroesophageal reflux disease)    Hepatitis 1970's   History of recurrent UTIs    Hyperlipemia    Hypertension    Slow heart rate dx'd 09/24/2014   Urine incontinence     ALLERGIES:  is allergic to lisinopril and codeine.  MEDICATIONS:  Current Outpatient Medications  Medication Sig Dispense Refill   acetaminophen (TYLENOL) 650 MG CR tablet Take 1,300 mg by mouth every 8 (eight) hours as needed for pain. Reported on 09/23/2015     albuterol (VENTOLIN HFA) 108 (90 Base) MCG/ACT inhaler Inhale 2 puffs into the lungs every 6 (six) hours as needed for wheezing or shortness of breath. 1 each 3   Ascorbic Acid (VITAMIN C) 500 MG CAPS Take 500 mg by mouth 2 (two) times daily.     aspirin EC 81 MG tablet Take 1 tablet (81 mg total) by mouth daily. 90 tablet 3   CRANBERRY  EXTRACT PO Take 400 mg by mouth 2 (two) times daily. 2 tablets twice a day     ezetimibe (ZETIA) 10 MG tablet Take 1 tablet (10 mg total) by mouth daily. 90 tablet 3   FeFum-FePoly-FA-B Cmp-C-Biot (INTEGRA PLUS) CAPS Take 1 capsule by mouth every morning. 30 capsule 2   hydrALAZINE (APRESOLINE) 25 MG tablet TAKE 1 TABLET BY MOUTH THREE TIMES DAILY 228 tablet 0   isosorbide mononitrate (IMDUR) 30 MG 24 hr tablet Take 1 tablet by mouth once daily 90 tablet 3   Lactobacillus (ACIDOPHILUS PO) Take 5 mg by mouth daily.     linagliptin (TRADJENTA) 5 MG TABS tablet Take 1 tablet (5 mg total) by mouth daily. 90 tablet 3   Multiple Vitamin (MULTIVITAMIN WITH MINERALS) TABS tablet Take 1 tablet by mouth daily.     nitroGLYCERIN (NITROSTAT) 0.4 MG SL tablet Place 1 tablet (0.4 mg total) under the tongue every 5 (five) minutes as needed for chest pain. 25 tablet prn   rosuvastatin (CRESTOR) 20 MG tablet Take 1 tablet by mouth once daily 21 tablet 1   triamterene-hydrochlorothiazide (MAXZIDE-25) 37.5-25 MG tablet Take 1 tablet by mouth once daily 12 tablet 0   No current facility-administered medications for this visit.    SURGICAL HISTORY:  Past  Surgical History:  Procedure Laterality Date   ABDOMINAL HYSTERECTOMY  1984   APPENDECTOMY     BREAST BIOPSY Right 1996   BREAST LUMPECTOMY Right 1996   LEFT HEART CATH AND CORONARY ANGIOGRAPHY N/A 01/13/2019   Procedure: LEFT HEART CATH AND CORONARY ANGIOGRAPHY;  Surgeon: Nelva Bush, MD;  Location: [place] [facility];  Service: Cardiovascular;  Laterality: N/A;   MASTECTOMY Right 1996   TONSILLECTOMY      REVIEW OF SYSTEMS:  A comprehensive review of systems was negative except for: Constitutional: positive for fatigue   PHYSICAL EXAMINATION: General appearance: alert, cooperative, fatigued, and no distress Head: Normocephalic, without obvious abnormality, atraumatic Neck: no adenopathy, no JVD, supple, symmetrical, trachea midline, and thyroid not  enlarged, symmetric, no tenderness/mass/nodules Lymph nodes: Cervical, supraclavicular, and axillary nodes normal. Resp: clear to auscultation bilaterally Back: symmetric, no curvature. ROM normal. No CVA tenderness. Cardio: regular rate and rhythm, S1, S2 normal, no murmur, click, rub or gallop GI: soft, non-tender; bowel sounds normal; no masses,  no organomegaly Extremities: extremities normal, atraumatic, no cyanosis or edema  ECOG PERFORMANCE STATUS: 1 - Symptomatic but completely ambulatory  Blood pressure 132/69, pulse 71, temperature (!) 97.1 F (36.2 C), temperature source Tympanic, resp. rate 20, height 5\' 6"  (1.676 m), weight 284 lb 11.2 oz (129.1 kg), SpO2 98 %.  LABORATORY DATA: Lab Results  Component Value Date   WBC 8.9 04/08/2021   HGB 8.9 (L) 04/08/2021   HCT 31.3 (L) 04/08/2021   MCV 69.2 (L) 04/08/2021   PLT 285 04/08/2021      Chemistry      Component Value Date/Time   NA 141 07/08/2020 1422   NA 140 01/30/2019 1330   K 3.7 07/08/2020 1422   CL 101 07/08/2020 1422   CO2 31 07/08/2020 1422   BUN 18 07/08/2020 1422   BUN 18 01/30/2019 1330   CREATININE 1.20 07/08/2020 1422   CREATININE 1.20 (H) 12/21/2019 1336   CREATININE 1.28 (H) 09/21/2016 1115      Component Value Date/Time   CALCIUM 9.5 07/08/2020 1422   ALKPHOS 88 07/08/2020 1422   AST 13 07/08/2020 1422   AST 12 (L) 12/21/2019 1336   ALT 9 07/08/2020 1422   ALT 8 12/21/2019 1336   BILITOT 0.4 07/08/2020 1422   BILITOT 0.4 12/21/2019 1336       RADIOGRAPHIC STUDIES: No results found.  ASSESSMENT AND PLAN: This is a very pleasant 70 years old African-American female with iron deficiency anemia status post Venofer infusion in October 2021.   The patient is status post iron infusion with Venofer completed last year and tolerated it fairly well.  She has been on oral iron tablet with Integra +1 capsule p.o. daily. The patient was found to have severe iron deficiency and she received Venofer  300 mg IV weekly for 3 weeks.  Last dose was given 2 weeks ago. Repeat CBC today showed the persistent anemia with hemoglobin of 8.9 but her iron study has significantly improved.  It may be too early to see the effect of the iron infusion.  I will see her back for follow-up visit in 6 weeks for evaluation and repeat CBC, iron study and ferritin.  I also added copper and ceruloplasmin to her blood work today to rule out any underlying copper deficiency. She was advised to call immediately if she has any other concerning symptoms in the interval. The patient voices understanding of current disease status and treatment options and is in agreement with  the current care plan.  All questions were answered. The patient knows to call the clinic with any problems, questions or concerns. We can certainly see the patient much sooner if necessary.  Disclaimer: This note was dictated with voice recognition software. Similar sounding words can inadvertently be transcribed and may not be corrected upon review.

## 2021-04-09 LAB — CERULOPLASMIN: Ceruloplasmin: 31.7 mg/dL (ref 19.0–39.0)

## 2021-04-15 ENCOUNTER — Other Ambulatory Visit: Payer: 59

## 2021-04-15 ENCOUNTER — Ambulatory Visit: Payer: 59 | Admitting: Internal Medicine

## 2021-04-16 ENCOUNTER — Telehealth: Payer: Self-pay | Admitting: Internal Medicine

## 2021-04-16 NOTE — Telephone Encounter (Signed)
Sch per 12/20 los, pt aware °

## 2021-04-22 ENCOUNTER — Encounter: Payer: Self-pay | Admitting: Internal Medicine

## 2021-04-22 ENCOUNTER — Other Ambulatory Visit: Payer: Self-pay | Admitting: Pharmacy Technician

## 2021-04-22 ENCOUNTER — Other Ambulatory Visit: Payer: Self-pay | Admitting: Registered Nurse

## 2021-04-22 DIAGNOSIS — M549 Dorsalgia, unspecified: Secondary | ICD-10-CM

## 2021-04-22 DIAGNOSIS — N281 Cyst of kidney, acquired: Secondary | ICD-10-CM

## 2021-04-29 ENCOUNTER — Other Ambulatory Visit: Payer: Self-pay

## 2021-04-29 ENCOUNTER — Ambulatory Visit (INDEPENDENT_AMBULATORY_CARE_PROVIDER_SITE_OTHER): Payer: Commercial Managed Care - HMO | Admitting: Podiatry

## 2021-04-29 DIAGNOSIS — E119 Type 2 diabetes mellitus without complications: Secondary | ICD-10-CM | POA: Diagnosis not present

## 2021-04-29 DIAGNOSIS — B351 Tinea unguium: Secondary | ICD-10-CM

## 2021-04-29 DIAGNOSIS — M79675 Pain in left toe(s): Secondary | ICD-10-CM

## 2021-04-29 DIAGNOSIS — M79674 Pain in right toe(s): Secondary | ICD-10-CM

## 2021-05-04 ENCOUNTER — Encounter: Payer: Self-pay | Admitting: Podiatry

## 2021-05-04 NOTE — Progress Notes (Signed)
ANNUAL DIABETIC FOOT EXAM  Subjective: [name] Cooper presents today for for annual diabetic foot examination.  Patient relates 5-6 year h/o diabetes.  Patient denies any h/o foot wounds.  Patient has occasional nocturnal symptoms of foot numbness.  Patient does not monitor blood glucose daily.  Risk factors:  former smoker, diabetes, hyperlipidemia.  [name] Holms, NP is patient's PCP. Last visit was two months ago.  Past Medical History:  Diagnosis Date   Anemia    Arthritis    Cancer ([place])    breast and ovarian   Chicken pox    Chronic lower back pain    COPD (chronic obstructive pulmonary disease) (HCC)    Diabetes (HCC)    GERD (gastroesophageal reflux disease)    Hepatitis 1970's   History of recurrent UTIs    Hyperlipemia    Hypertension    Slow heart rate dx'd 09/24/2014   Urine incontinence    Patient Active Problem List   Diagnosis Date Noted   Coronary artery disease involving native coronary artery of native heart with angina pectoris ([place]) 03/03/2021   Localized swelling of forearm 12/11/2020   CKD (chronic kidney disease) stage 3, GFR 30-59 ml/min (HCC) 10/11/2019   Heme positive stool 09/14/2019   Iron deficiency anemia 07/31/2019   Diabetes mellitus without complication ([place]) 67/89/3810   Dyspnea on exertion 01/13/2019   Morbid obesity ([place]) 10/12/2018   Former smoker 10/12/2018   Cough 10/11/2018   Microcytic anemia 06/15/2018   History of right mastectomy 06/15/2018   Vitamin D deficiency 06/15/2018   Emphysema of lung ([place]) 06/15/2018   Urine, incontinence, stress female 06/15/2018   Estrogen deficiency 03/30/2016   Rhinitis, allergic    HTN (hypertension) 09/24/2014   Pure hypercholesterolemia 09/24/2014   OA (osteoarthritis) 09/24/2014   History of solitary pulmonary nodule 03/21/2008   History of right breast cancer 03/21/2008   Past Surgical History:  Procedure Laterality Date   ABDOMINAL HYSTERECTOMY  1984   APPENDECTOMY     BREAST  BIOPSY Right 1996   BREAST LUMPECTOMY Right 1996   LEFT HEART CATH AND CORONARY ANGIOGRAPHY N/A 01/13/2019   Procedure: LEFT HEART CATH AND CORONARY ANGIOGRAPHY;  Surgeon: Nelva Bush, MD;  Location: [facility];  Service: Cardiovascular;  Laterality: N/A;   MASTECTOMY Right 1996   TONSILLECTOMY     Current Outpatient Medications on File Prior to Visit  Medication Sig Dispense Refill   acetaminophen (TYLENOL) 650 MG CR tablet Take 1,300 mg by mouth every 8 (eight) hours as needed for pain. Reported on 09/23/2015     albuterol (VENTOLIN HFA) 108 (90 Base) MCG/ACT inhaler Inhale 2 puffs into the lungs every 6 (six) hours as needed for wheezing or shortness of breath. 1 each 3   Ascorbic Acid (VITAMIN C) 500 MG CAPS Take 500 mg by mouth 2 (two) times daily.     aspirin EC 81 MG tablet Take 1 tablet (81 mg total) by mouth daily. 90 tablet 3   CRANBERRY EXTRACT PO Take 400 mg by mouth 2 (two) times daily. 2 tablets twice a day     ezetimibe (ZETIA) 10 MG tablet Take 1 tablet (10 mg total) by mouth daily. 90 tablet 3   FeFum-FePoly-FA-B Cmp-C-Biot (INTEGRA PLUS) CAPS Take 1 capsule by mouth every morning. 30 capsule 2   hydrALAZINE (APRESOLINE) 25 MG tablet TAKE 1 TABLET BY MOUTH THREE TIMES DAILY 228 tablet 0   isosorbide mononitrate (IMDUR) 30 MG 24 hr tablet Take 1 tablet by mouth once  daily 90 tablet 3   Lactobacillus (ACIDOPHILUS PO) Take 5 mg by mouth daily.     linagliptin (TRADJENTA) 5 MG TABS tablet Take 1 tablet (5 mg total) by mouth daily. 90 tablet 3   Multiple Vitamin (MULTIVITAMIN WITH MINERALS) TABS tablet Take 1 tablet by mouth daily.     nitroGLYCERIN (NITROSTAT) 0.4 MG SL tablet Place 1 tablet (0.4 mg total) under the tongue every 5 (five) minutes as needed for chest pain. 25 tablet prn   predniSONE (STERAPRED UNI-PAK 21 TAB) 10 MG (21) TBPK tablet Take by mouth as directed.     rosuvastatin (CRESTOR) 20 MG tablet Take 1 tablet by mouth once daily 21 tablet 1   tiZANidine  (ZANAFLEX) 4 MG capsule Take 8 mg by mouth every 8 (eight) hours as needed.     triamterene-hydrochlorothiazide (MAXZIDE-25) 37.5-25 MG tablet Take 1 tablet by mouth once daily 12 tablet 0   No current facility-administered medications on file prior to visit.    Allergies  Allergen Reactions   Lisinopril Anaphylaxis   Codeine     REACTION: MAKES HER SLEEPY   Social History   Occupational History   Occupation: Retired     Fish farm manager: [employer]  Tobacco Use   Smoking status: Every Day    Packs/day: 0.25    Years: 47.00    Pack years: 11.75    Types: Cigarettes   Smokeless tobacco: Never   Tobacco comments:    quit date: 01/19/2019  Vaping Use   Vaping Use: Never used  Substance and Sexual Activity   Alcohol use: No    Alcohol/week: 0.0 standard drinks   Drug use: No    Types: "Crack" cocaine    Comment: 09/24/2014 "last crack was in 2013"   Sexual activity: Not on file   Family History  Problem Relation Age of Onset   Cancer Mother    Early death Mother    Arthritis Brother    Colon cancer Neg Hx    Esophageal cancer Neg Hx    Rectal cancer Neg Hx    Stomach cancer Neg Hx    Immunization History  Administered Date(s) Administered   Fluad Quad(high Dose 65+) 02/06/2019   Influenza,inj,Quad PF,6+ Mos 03/17/2016, 12/22/2016, 12/22/2017   Influenza-Unspecified 03/18/2020   PFIZER(Purple Top)SARS-COV-2 Vaccination 07/06/2019, 08/01/2019, 03/18/2020   Pneumococcal Conjugate-13 03/17/2016   Pneumococcal Polysaccharide-23 09/25/2014     Review of Systems: Negative except as noted in the HPI.   Objective: There were no vitals filed for this visit.  [name] Cooper is a pleasant 72 y.o. female in NAD. AAO X 3.  Vascular Examination: CFT immediate b/l LE. Palpable DP/PT pulses b/l LE. Digital hair sparse b/l. Skin temperature gradient WNL b/l. No pain with calf compression b/l. No edema noted b/l. No cyanosis or clubbing noted b/l LE.  Dermatological  Examination: Pedal integument with normal turgor, texture and tone b/l LE. No open wounds b/l. No interdigital macerations b/l. Toenails 1-5 b/l elongated, thickened, discolored with subungual debris. +Tenderness with dorsal palpation of nailplates. No hyperkeratotic or porokeratotic lesions present.  Musculoskeletal Examination: Normal muscle strength 5/5 to all lower extremity muscle groups bilaterally. No pain, crepitus or joint limitation noted with ROM b/l LE. No gross bony pedal deformities b/l. Patient ambulates independently without assistive aids.  Footwear Assessment: Does the patient wear appropriate shoes? Yes. Does the patient need inserts/orthotics? No.  Neurological Examination: Protective sensation intact 5/5 intact bilaterally with 10g monofilament b/l. Vibratory sensation intact b/l.  Hemoglobin A1C  Latest Ref Rng & Units 07/08/2020  HGBA1C 4.6 - 6.5 % 6.5  Some recent data might be hidden   Assessment: 1. Pain due to onychomycosis of toenails of both feet   2. Diabetes mellitus without complication ([place])   3. Encounter for diabetic foot exam ([place])     ADA Risk Categorization: Low Risk :  Patient has all of the following: Intact protective sensation No prior foot ulcer  No severe deformity Pedal pulses present  Plan: -Diabetic foot examination performed today. -Continue foot and shoe inspections daily. Monitor blood glucose per PCP/Endocrinologist's recommendations. -Mycotic toenails 1-5 bilaterally were debrided in length and girth with sterile nail nippers and dremel without incident. -Patient/POA to call should there be question/concern in the interim.  Return in about 3 months (around 07/28/2021).  Marzetta Board, DPM

## 2021-05-06 ENCOUNTER — Telehealth: Payer: Self-pay | Admitting: Internal Medicine

## 2021-05-06 NOTE — Telephone Encounter (Signed)
Rescheduled 01/31 appointment to 02/07 due to scheduling request, patient has been called and notified.

## 2021-05-15 ENCOUNTER — Ambulatory Visit
Admission: RE | Admit: 2021-05-15 | Discharge: 2021-05-15 | Disposition: A | Discharge status: Home / Self Care | Payer: Medicare Other | Source: Ambulatory Visit | Attending: Registered Nurse | Admitting: Registered Nurse

## 2021-05-15 ENCOUNTER — Ambulatory Visit: Payer: 59

## 2021-05-15 ENCOUNTER — Other Ambulatory Visit: Payer: Self-pay

## 2021-05-15 DIAGNOSIS — N281 Cyst of kidney, acquired: Secondary | ICD-10-CM

## 2021-05-15 DIAGNOSIS — M549 Dorsalgia, unspecified: Secondary | ICD-10-CM

## 2021-05-20 ENCOUNTER — Inpatient Hospital Stay: Payer: Medicare Other | Admitting: Internal Medicine

## 2021-05-20 ENCOUNTER — Inpatient Hospital Stay: Payer: Medicare Other

## 2021-05-27 ENCOUNTER — Other Ambulatory Visit: Payer: Self-pay

## 2021-05-27 ENCOUNTER — Inpatient Hospital Stay (HOSPITAL_BASED_OUTPATIENT_CLINIC_OR_DEPARTMENT_OTHER): Payer: Medicare Other | Admitting: Internal Medicine

## 2021-05-27 ENCOUNTER — Inpatient Hospital Stay: Payer: Medicare Other | Attending: Internal Medicine

## 2021-05-27 VITALS — BP 112/61 | HR 84 | Temp 97.7°F | Resp 17 | Wt 267.6 lb

## 2021-05-27 DIAGNOSIS — M5127 Other intervertebral disc displacement, lumbosacral region: Secondary | ICD-10-CM | POA: Diagnosis not present

## 2021-05-27 DIAGNOSIS — D509 Iron deficiency anemia, unspecified: Secondary | ICD-10-CM | POA: Insufficient documentation

## 2021-05-27 LAB — CBC WITH DIFFERENTIAL (CANCER CENTER ONLY)
Abs Immature Granulocytes: 0.03 10*3/uL (ref 0.00–0.07)
Basophils Absolute: 0.1 10*3/uL (ref 0.0–0.1)
Basophils Relative: 1 %
Eosinophils Absolute: 0.3 10*3/uL (ref 0.0–0.5)
Eosinophils Relative: 3 %
HCT: 25.7 % — ABNORMAL LOW (ref 36.0–46.0)
Hemoglobin: 7.4 g/dL — ABNORMAL LOW (ref 12.0–15.0)
Immature Granulocytes: 0 %
Lymphocytes Relative: 28 %
Lymphs Abs: 3 10*3/uL (ref 0.7–4.0)
MCH: 18.6 pg — ABNORMAL LOW (ref 26.0–34.0)
MCHC: 28.8 g/dL — ABNORMAL LOW (ref 30.0–36.0)
MCV: 64.6 fL — ABNORMAL LOW (ref 80.0–100.0)
Monocytes Absolute: 1 10*3/uL (ref 0.1–1.0)
Monocytes Relative: 10 %
Neutro Abs: 6 10*3/uL (ref 1.7–7.7)
Neutrophils Relative %: 58 %
Platelet Count: 295 10*3/uL (ref 150–400)
RBC: 3.98 MIL/uL (ref 3.87–5.11)
RDW: 22.9 % — ABNORMAL HIGH (ref 11.5–15.5)
WBC Count: 10.4 10*3/uL (ref 4.0–10.5)
nRBC: 0.3 % — ABNORMAL HIGH (ref 0.0–0.2)

## 2021-05-27 LAB — FERRITIN: Ferritin: 9 ng/mL — ABNORMAL LOW (ref 11–307)

## 2021-05-27 NOTE — Progress Notes (Signed)
[facility] [phone]   [phone]  OFFICE PROGRESS NOTE  [name], NP [place] [address]  DIAGNOSIS: Iron deficiency anemia   PRIOR THERAPY: None   CURRENT THERAPY:  1) IV iron PRN with venofer 300 mg weekly for 3 weeks at a time.  Last dose was [place] 2 weeks ago. 2) Oral iron supplement with integra plus p.o. daily.    INTERVAL HISTORY: [name] Cooper 71 y.o. female returns to the clinic today for follow-up visit.  The patient continues to complain of fatigue as well as severe back pain.  She is followed by nephrology for her chronic kidney disease and she was referred to [facility] kidney for further evaluation.  She denied having any current chest pain, shortness of breath except with exertion with no cough or hemoptysis.  She denied having any fever or chills.  She has no nausea, vomiting, diarrhea or constipation.  She has no headache or visual changes.  She is here today for evaluation and repeat blood work.  MEDICAL HISTORY: Past Medical History:  Diagnosis Date   Anemia    Arthritis    Cancer ([place])    breast and ovarian   Chicken pox    Chronic lower back pain    COPD (chronic obstructive pulmonary disease) (HCC)    Diabetes (HCC)    GERD (gastroesophageal reflux disease)    Hepatitis 1970's   History of recurrent UTIs    Hyperlipemia    Hypertension    Slow heart rate dx'd 09/24/2014   Urine incontinence     ALLERGIES:  is allergic to lisinopril and codeine.  MEDICATIONS:  Current Outpatient Medications  Medication Sig Dispense Refill   acetaminophen (TYLENOL) 650 MG CR tablet Take 1,300 mg by mouth every 8 (eight) hours as needed for pain. Reported on 09/23/2015     albuterol (VENTOLIN HFA) 108 (90 Base) MCG/ACT inhaler Inhale 2 puffs into the lungs every 6 (six) hours as needed for wheezing or shortness of breath. 1 each 3   Ascorbic Acid (VITAMIN C) 500 MG CAPS Take 500 mg by mouth 2 (two)  times daily.     aspirin EC 81 MG tablet Take 1 tablet (81 mg total) by mouth daily. 90 tablet 3   CRANBERRY EXTRACT PO Take 400 mg by mouth 2 (two) times daily. 2 tablets twice a day     ezetimibe (ZETIA) 10 MG tablet Take 1 tablet (10 mg total) by mouth daily. 90 tablet 3   FeFum-FePoly-FA-B Cmp-C-Biot (INTEGRA PLUS) CAPS Take 1 capsule by mouth every morning. 30 capsule 2   hydrALAZINE (APRESOLINE) 25 MG tablet TAKE 1 TABLET BY MOUTH THREE TIMES DAILY 228 tablet 0   isosorbide mononitrate (IMDUR) 30 MG 24 hr tablet Take 1 tablet by mouth once daily 90 tablet 3   Lactobacillus (ACIDOPHILUS PO) Take 5 mg by mouth daily.     linagliptin (TRADJENTA) 5 MG TABS tablet Take 1 tablet (5 mg total) by mouth daily. 90 tablet 3   Multiple Vitamin (MULTIVITAMIN WITH MINERALS) TABS tablet Take 1 tablet by mouth daily.     nitroGLYCERIN (NITROSTAT) 0.4 MG SL tablet Place 1 tablet (0.4 mg total) under the tongue every 5 (five) minutes as needed for chest pain. 25 tablet prn   predniSONE (STERAPRED UNI-PAK 21 TAB) 10 MG (21) TBPK tablet Take by mouth as directed.     rosuvastatin (CRESTOR) 20 MG tablet Take 1 tablet by mouth  once daily 21 tablet 1   tiZANidine (ZANAFLEX) 4 MG capsule Take 8 mg by mouth every 8 (eight) hours as needed.     triamterene-hydrochlorothiazide (MAXZIDE-25) 37.5-25 MG tablet Take 1 tablet by mouth once daily 12 tablet 0   No current facility-administered medications for this visit.    SURGICAL HISTORY:  Past Surgical History:  Procedure Laterality Date   ABDOMINAL HYSTERECTOMY  1984   APPENDECTOMY     BREAST BIOPSY Right 1996   BREAST LUMPECTOMY Right 1996   LEFT HEART CATH AND CORONARY ANGIOGRAPHY N/A 01/13/2019   Procedure: LEFT HEART CATH AND CORONARY ANGIOGRAPHY;  Surgeon: Nelva Bush, MD;  Location: [facility];  Service: Cardiovascular;  Laterality: N/A;   MASTECTOMY Right 1996   TONSILLECTOMY      REVIEW OF SYSTEMS:  A comprehensive review of systems was  negative except for: Constitutional: positive for fatigue Musculoskeletal: positive for back pain   PHYSICAL EXAMINATION: General appearance: alert, cooperative, fatigued, and no distress Head: Normocephalic, without obvious abnormality, atraumatic Neck: no adenopathy, no JVD, supple, symmetrical, trachea midline, and thyroid not enlarged, symmetric, no tenderness/mass/nodules Lymph nodes: Cervical, supraclavicular, and axillary nodes normal. Resp: clear to auscultation bilaterally Back: symmetric, no curvature. ROM normal. No CVA tenderness. Cardio: regular rate and rhythm, S1, S2 normal, no murmur, click, rub or gallop GI: soft, non-tender; bowel sounds normal; no masses,  no organomegaly Extremities: extremities normal, atraumatic, no cyanosis or edema  ECOG PERFORMANCE STATUS: 1 - Symptomatic but completely ambulatory  Blood pressure 112/61, pulse 84, temperature 97.7 F (36.5 C), resp. rate 17, weight 267 lb 9.6 oz (121.4 kg), SpO2 100 %.  LABORATORY DATA: Lab Results  Component Value Date   WBC 10.4 05/27/2021   HGB 7.4 (L) 05/27/2021   HCT 25.7 (L) 05/27/2021   MCV 64.6 (L) 05/27/2021   PLT 295 05/27/2021      Chemistry      Component Value Date/Time   NA 141 07/08/2020 1422   NA 140 01/30/2019 1330   K 3.7 07/08/2020 1422   CL 101 07/08/2020 1422   CO2 31 07/08/2020 1422   BUN 18 07/08/2020 1422   BUN 18 01/30/2019 1330   CREATININE 1.20 07/08/2020 1422   CREATININE 1.20 (H) 12/21/2019 1336   CREATININE 1.28 (H) 09/21/2016 1115      Component Value Date/Time   CALCIUM 9.5 07/08/2020 1422   ALKPHOS 88 07/08/2020 1422   AST 13 07/08/2020 1422   AST 12 (L) 12/21/2019 1336   ALT 9 07/08/2020 1422   ALT 8 12/21/2019 1336   BILITOT 0.4 07/08/2020 1422   BILITOT 0.4 12/21/2019 1336       RADIOGRAPHIC STUDIES: CT ABDOMEN WO CONTRAST  Result Date: 05/17/2021 CLINICAL DATA:  Follow-up right renal cyst EXAM: CT ABDOMEN WITHOUT CONTRAST TECHNIQUE: Multidetector  CT imaging of the abdomen was performed following the standard protocol without IV contrast. RADIATION DOSE REDUCTION: This exam was performed according to the departmental dose-optimization program which includes automated exposure control, adjustment of the mA and/or kV according to patient size and/or use of iterative reconstruction technique. COMPARISON:  01/22/2021 FINDINGS: Lower chest: Lung bases are free of acute infiltrate or sizable effusion. Hepatobiliary: No focal liver abnormality is seen. No gallstones, gallbladder wall thickening, or biliary dilatation. Pancreas: Unremarkable. No pancreatic ductal dilatation or surrounding inflammatory changes. Spleen: Normal in size without focal abnormality. Adrenals/Urinary Tract: The adrenal glands are within normal limits. Renal cysts are noted within the right kidney stable in appearance from  the prior exam. No renal calculi are seen. Small foci of air are noted within the left renal collecting system relatively stable from the prior study. No obstructive changes are seen. Stomach/Bowel: Visualized colon is within normal limits. The stomach and small bowel as visualized are within normal limits. Vascular/Lymphatic: Aortic atherosclerosis. No enlarged abdominal lymph nodes. Other: No abdominal wall hernia or abnormality. Musculoskeletal: Degenerative changes of lumbar spine are noted. IMPRESSION: Stable renal cysts on the right Stable gas in the collecting system of the left kidney. No acute abnormality seen. Electronically Signed   By: Inez Catalina M.D.   On: 05/17/2021 02:50   CT LUMBAR SPINE WO CONTRAST  Result Date: 05/17/2021 CLINICAL DATA:  Bilateral lower extremity radiculopathy EXAM: CT LUMBAR SPINE WITHOUT CONTRAST TECHNIQUE: Multidetector CT imaging of the lumbar spine was performed without intravenous contrast administration. Multiplanar CT image reconstructions were also generated. RADIATION DOSE REDUCTION: This exam was performed according to the  departmental dose-optimization program which includes automated exposure control, adjustment of the mA and/or kV according to patient size and/or use of iterative reconstruction technique. COMPARISON:  None. FINDINGS: Segmentation: 5 lumbar type vertebrae. Alignment: Grade 1 retrolisthesis at L3-4 Vertebrae: No acute fracture or focal pathologic process. Paraspinal and other soft tissues: Calcific aortic atherosclerosis Disc levels: L1-2: Unremarkable. L2-3: Small disc bulge without stenosis. L3-4: Small disc bulge without stenosis. L4-5: Small disc bulge with moderate facet hypertrophy. No spinal canal or neural foraminal stenosis. L5-S1: Small left subarticular disc protrusion narrowing the left lateral recess. No central spinal canal stenosis. No neural foraminal stenosis. IMPRESSION: 1. Small left subarticular disc protrusion at L5-S1 narrowing the left lateral recess. 2. Mild multilevel lumbar degenerative disc disease without spinal canal or neural foraminal stenosis. Aortic Atherosclerosis (ICD10-I70.0). Electronically Signed   By: Ulyses Jarred M.D.   On: 05/17/2021 21:30    ASSESSMENT AND PLAN: This is a very pleasant 71 years old African-American female with iron deficiency anemia status post Venofer infusion in October 2021.   The patient is status post iron infusion with Venofer completed last year and tolerated it fairly well.  She has been on oral iron tablet with Integra +1 capsule p.o. daily. The patient was found to have severe iron deficiency and she received Venofer 300 mg IV weekly for 3 weeks.   The patient tolerated her iron infusion fairly well.  Her serum iron and ferritin improved but she continues to have persistent anemia. Repeat CBC today showed further decline in her hemoglobin and hematocrit.  Iron study and ferritin are still pending. I recommended for the patient to proceed with iron infusion again with Venofer but at a dose of 400 Mg IV weekly for 3 weeks. I will see her back  for follow-up visit in 2 months for evaluation and repeat CBC, iron study and ferritin. For the back pain she is followed by her primary care physician and recent MRI of the lumbar spine showed small left subarticular disc protrusion at L5-S1 narrowing the left lateral recess. The patient was advised to call immediately if she has any other concerning symptoms in the interval.  The patient voices understanding of current disease status and treatment options and is in agreement with the current care plan.  All questions were answered. The patient knows to call the clinic with any problems, questions or concerns. We can certainly see the patient much sooner if necessary.  Disclaimer: This note was dictated with voice recognition software. Similar sounding words can inadvertently be transcribed and may  not be corrected upon review.

## 2021-05-28 LAB — CERULOPLASMIN: Ceruloplasmin: 36.2 mg/dL (ref 19.0–39.0)

## 2021-05-29 ENCOUNTER — Ambulatory Visit (INDEPENDENT_AMBULATORY_CARE_PROVIDER_SITE_OTHER): Payer: Medicare Other

## 2021-05-29 ENCOUNTER — Other Ambulatory Visit: Payer: Self-pay

## 2021-05-29 VITALS — BP 96/59 | HR 66 | Temp 98.4°F | Resp 18 | Ht 66.0 in | Wt 267.4 lb

## 2021-05-29 DIAGNOSIS — D500 Iron deficiency anemia secondary to blood loss (chronic): Secondary | ICD-10-CM

## 2021-05-29 LAB — COPPER, SERUM: Copper: 147 ug/dL (ref 80–158)

## 2021-05-29 MED ORDER — HEPARIN SOD (PORK) LOCK FLUSH 100 UNIT/ML IV SOLN: 500.0000 [IU] | Freq: Once | INTRAVENOUS | Status: DC | PRN

## 2021-05-29 MED ORDER — METHYLPREDNISOLONE SODIUM SUCC 125 MG IJ SOLR: 125.0000 mg | Freq: Once | INTRAMUSCULAR | Status: DC | PRN

## 2021-05-29 MED ORDER — ALTEPLASE 2 MG IJ SOLR: 2.0000 mg | Freq: Once | INTRAMUSCULAR | Status: DC | PRN

## 2021-05-29 MED ORDER — ANTICOAGULANT SODIUM CITRATE 4% (200MG/5ML) IV SOLN
5.0000 mL | Freq: Once | Status: DC | PRN
  Filled 2021-05-29: qty 5

## 2021-05-29 MED ORDER — DIPHENHYDRAMINE HCL 50 MG/ML IJ SOLN: 50.0000 mg | Freq: Once | INTRAMUSCULAR | Status: DC | PRN

## 2021-05-29 MED ORDER — SODIUM CHLORIDE 0.9% FLUSH: 3.0000 mL | Freq: Once | INTRAVENOUS | Status: DC | PRN

## 2021-05-29 MED ORDER — ALBUTEROL SULFATE HFA 108 (90 BASE) MCG/ACT IN AERS: 2.0000 | INHALATION_SPRAY | Freq: Once | RESPIRATORY_TRACT | Status: DC | PRN

## 2021-05-29 MED ORDER — SODIUM CHLORIDE 0.9 % IV SOLN
400.0000 mg | INTRAVENOUS | Status: DC
  Administered 2021-05-29: 400 mg via INTRAVENOUS
  Filled 2021-05-29: qty 20

## 2021-05-29 MED ORDER — SODIUM CHLORIDE 0.9 % IV SOLN: Freq: Once | INTRAVENOUS | Status: DC | PRN

## 2021-05-29 MED ORDER — HEPARIN SOD (PORK) LOCK FLUSH 100 UNIT/ML IV SOLN: 250.0000 [IU] | Freq: Once | INTRAVENOUS | Status: DC | PRN

## 2021-05-29 MED ORDER — EPINEPHRINE 0.3 MG/0.3ML IJ SOAJ: 0.3000 mg | Freq: Once | INTRAMUSCULAR | Status: DC | PRN

## 2021-05-29 MED ORDER — FAMOTIDINE IN NACL 20-0.9 MG/50ML-% IV SOLN: 20.0000 mg | Freq: Once | INTRAVENOUS | Status: DC | PRN

## 2021-05-29 MED ORDER — SODIUM CHLORIDE 0.9% FLUSH: 10.0000 mL | Freq: Once | INTRAVENOUS | Status: DC | PRN

## 2021-05-29 NOTE — Progress Notes (Signed)
Diagnosis: Iron Deficiency Anemia  Provider:  Marshell Garfinkel, MD  Procedure: Infusion  IV Type: Peripheral, IV Location: L Forearm  Venofer (Iron Sucrose), Dose: 400mg   Infusion Start Time: 09.23 05/29/2021  Infusion Stop Time: 12.20 05/29/2021  Post Infusion IV Care: Peripheral IV Discontinued  Discharge: Condition: Good, Destination: Home . AVS provided to patient.   Performed by:  Arnoldo Morale, RN

## 2021-06-05 ENCOUNTER — Ambulatory Visit (INDEPENDENT_AMBULATORY_CARE_PROVIDER_SITE_OTHER): Payer: Medicare Other

## 2021-06-05 ENCOUNTER — Other Ambulatory Visit: Payer: Medicare Other

## 2021-06-05 ENCOUNTER — Other Ambulatory Visit: Payer: Self-pay

## 2021-06-05 VITALS — BP 112/68 | HR 62 | Temp 98.1°F | Resp 16 | Wt 266.8 lb

## 2021-06-05 DIAGNOSIS — D500 Iron deficiency anemia secondary to blood loss (chronic): Secondary | ICD-10-CM | POA: Diagnosis not present

## 2021-06-05 DIAGNOSIS — E782 Mixed hyperlipidemia: Secondary | ICD-10-CM

## 2021-06-05 DIAGNOSIS — I2510 Atherosclerotic heart disease of native coronary artery without angina pectoris: Secondary | ICD-10-CM

## 2021-06-05 DIAGNOSIS — Z79899 Other long term (current) drug therapy: Secondary | ICD-10-CM

## 2021-06-05 LAB — LIPID PANEL
Chol/HDL Ratio: 2 ratio (ref 0.0–4.4)
Cholesterol, Total: 110 mg/dL (ref 100–199)
HDL: 56 mg/dL (ref >39)
LDL Chol Calc (NIH): 39 mg/dL (ref 0–99)
Triglycerides: 73 mg/dL (ref 0–149)
VLDL Cholesterol Cal: 15 mg/dL (ref 5–40)

## 2021-06-05 LAB — ALT: ALT: 10 IU/L (ref 0–32)

## 2021-06-05 MED ORDER — SODIUM CHLORIDE 0.9 % IV SOLN
400.0000 mg | INTRAVENOUS | Status: DC
  Administered 2021-06-05: 400 mg via INTRAVENOUS
  Filled 2021-06-05: qty 20

## 2021-06-05 MED ORDER — EPINEPHRINE 0.3 MG/0.3ML IJ SOAJ: 0.3000 mg | Freq: Once | INTRAMUSCULAR | Status: DC | PRN

## 2021-06-05 MED ORDER — ALBUTEROL SULFATE HFA 108 (90 BASE) MCG/ACT IN AERS: 2.0000 | INHALATION_SPRAY | Freq: Once | RESPIRATORY_TRACT | Status: DC | PRN

## 2021-06-05 MED ORDER — METHYLPREDNISOLONE SODIUM SUCC 125 MG IJ SOLR: 125.0000 mg | Freq: Once | INTRAMUSCULAR | Status: DC | PRN

## 2021-06-05 MED ORDER — DIPHENHYDRAMINE HCL 50 MG/ML IJ SOLN: 50.0000 mg | Freq: Once | INTRAMUSCULAR | Status: DC | PRN

## 2021-06-05 MED ORDER — FAMOTIDINE IN NACL 20-0.9 MG/50ML-% IV SOLN: 20.0000 mg | Freq: Once | INTRAVENOUS | Status: DC | PRN

## 2021-06-05 MED ORDER — SODIUM CHLORIDE 0.9 % IV SOLN: Freq: Once | INTRAVENOUS | Status: DC | PRN

## 2021-06-05 NOTE — Progress Notes (Signed)
Diagnosis: Iron Deficiency Anemia  Provider:  Marshell Garfinkel, MD  Procedure: Infusion  IV Type: Peripheral, IV Location: R Forearm  Venofer (Iron Sucrose), Dose: 400mg   Infusion Start Time: 10.18 06/05/2021  Infusion Stop Time: 13.10 06/05/2021  Post Infusion IV Care: Peripheral IV Discontinued  Discharge: Condition: Good, Destination: Home . AVS provided to patient.   Performed by:  Arnoldo Morale, RN

## 2021-06-11 ENCOUNTER — Encounter: Payer: Self-pay | Admitting: Internal Medicine

## 2021-06-12 ENCOUNTER — Other Ambulatory Visit: Payer: Self-pay

## 2021-06-12 ENCOUNTER — Ambulatory Visit (INDEPENDENT_AMBULATORY_CARE_PROVIDER_SITE_OTHER): Payer: Medicare Other

## 2021-06-12 VITALS — BP 103/59 | HR 59 | Temp 98.0°F | Resp 16 | Ht 66.0 in | Wt 265.2 lb

## 2021-06-12 DIAGNOSIS — D500 Iron deficiency anemia secondary to blood loss (chronic): Secondary | ICD-10-CM | POA: Diagnosis not present

## 2021-06-12 MED ORDER — EPINEPHRINE 0.3 MG/0.3ML IJ SOAJ: 0.3000 mg | Freq: Once | INTRAMUSCULAR | Status: DC | PRN

## 2021-06-12 MED ORDER — ALBUTEROL SULFATE HFA 108 (90 BASE) MCG/ACT IN AERS: 2.0000 | INHALATION_SPRAY | Freq: Once | RESPIRATORY_TRACT | Status: DC | PRN

## 2021-06-12 MED ORDER — FAMOTIDINE IN NACL 20-0.9 MG/50ML-% IV SOLN: 20.0000 mg | Freq: Once | INTRAVENOUS | Status: DC | PRN

## 2021-06-12 MED ORDER — SODIUM CHLORIDE 0.9 % IV SOLN: Freq: Once | INTRAVENOUS | Status: DC | PRN

## 2021-06-12 MED ORDER — SODIUM CHLORIDE 0.9 % IV SOLN
400.0000 mg | INTRAVENOUS | Status: DC
  Administered 2021-06-12: 400 mg via INTRAVENOUS
  Filled 2021-06-12: qty 20

## 2021-06-12 MED ORDER — METHYLPREDNISOLONE SODIUM SUCC 125 MG IJ SOLR: 125.0000 mg | Freq: Once | INTRAMUSCULAR | Status: DC | PRN

## 2021-06-12 MED ORDER — DIPHENHYDRAMINE HCL 50 MG/ML IJ SOLN: 50.0000 mg | Freq: Once | INTRAMUSCULAR | Status: DC | PRN

## 2021-06-12 NOTE — Progress Notes (Signed)
Diagnosis: Iron Deficiency Anemia  Provider:  Marshell Garfinkel, MD  Procedure: Infusion  IV Type: Peripheral, IV Location: R Forearm  Venofer (Iron Sucrose), Dose: 400 mg  Infusion Start Time: 09.33 06/12/2021  Infusion Stop Time: 12.16 06/12/2021  Post Infusion IV Care: Peripheral IV Discontinued  Discharge: Condition: Good, Destination: Home . AVS provided to patient.   Performed by:  Arnoldo Morale, RN

## 2021-06-27 ENCOUNTER — Encounter: Payer: Self-pay | Admitting: Internal Medicine

## 2021-07-28 ENCOUNTER — Inpatient Hospital Stay: Payer: Medicare Other

## 2021-07-28 ENCOUNTER — Inpatient Hospital Stay: Payer: Medicare Other | Attending: Internal Medicine

## 2021-07-28 ENCOUNTER — Inpatient Hospital Stay (HOSPITAL_BASED_OUTPATIENT_CLINIC_OR_DEPARTMENT_OTHER): Payer: Medicare Other | Admitting: Internal Medicine

## 2021-07-28 ENCOUNTER — Other Ambulatory Visit: Payer: Self-pay

## 2021-07-28 ENCOUNTER — Other Ambulatory Visit: Payer: Self-pay | Admitting: Physician Assistant

## 2021-07-28 VITALS — BP 138/80 | HR 79 | Temp 97.2°F | Resp 20 | Ht 66.0 in | Wt 280.6 lb

## 2021-07-28 DIAGNOSIS — D509 Iron deficiency anemia, unspecified: Secondary | ICD-10-CM | POA: Diagnosis present

## 2021-07-28 DIAGNOSIS — M7989 Other specified soft tissue disorders: Secondary | ICD-10-CM

## 2021-07-28 DIAGNOSIS — Z87898 Personal history of other specified conditions: Secondary | ICD-10-CM

## 2021-07-28 LAB — CBC WITH DIFFERENTIAL (CANCER CENTER ONLY)
Abs Immature Granulocytes: 0.04 10*3/uL (ref 0.00–0.07)
Basophils Absolute: 0.1 10*3/uL (ref 0.0–0.1)
Basophils Relative: 1 %
Eosinophils Absolute: 0.4 10*3/uL (ref 0.0–0.5)
Eosinophils Relative: 6 %
HCT: 26.9 % — ABNORMAL LOW (ref 36.0–46.0)
Hemoglobin: 7.9 g/dL — ABNORMAL LOW (ref 12.0–15.0)
Immature Granulocytes: 1 %
Lymphocytes Relative: 33 %
Lymphs Abs: 2.5 10*3/uL (ref 0.7–4.0)
MCH: 20.1 pg — ABNORMAL LOW (ref 26.0–34.0)
MCHC: 29.4 g/dL — ABNORMAL LOW (ref 30.0–36.0)
MCV: 68.4 fL — ABNORMAL LOW (ref 80.0–100.0)
Monocytes Absolute: 0.8 10*3/uL (ref 0.1–1.0)
Monocytes Relative: 11 %
Neutro Abs: 3.7 10*3/uL (ref 1.7–7.7)
Neutrophils Relative %: 48 %
Platelet Count: 253 10*3/uL (ref 150–400)
RBC: 3.93 MIL/uL (ref 3.87–5.11)
RDW: 23.9 % — ABNORMAL HIGH (ref 11.5–15.5)
WBC Count: 7.6 10*3/uL (ref 4.0–10.5)
nRBC: 0.5 % — ABNORMAL HIGH (ref 0.0–0.2)

## 2021-07-28 LAB — PREPARE RBC (CROSSMATCH)

## 2021-07-28 LAB — SAMPLE TO BLOOD BANK

## 2021-07-28 LAB — IRON AND IRON BINDING CAPACITY (CC-WL,HP ONLY)
Iron: 162 ug/dL (ref 28–170)
Saturation Ratios: 51 % — ABNORMAL HIGH (ref 10.4–31.8)
TIBC: 318 ug/dL (ref 250–450)
UIBC: 156 ug/dL (ref 148–442)

## 2021-07-28 LAB — ABO/RH: ABO/RH(D): O POS

## 2021-07-28 LAB — FERRITIN: Ferritin: 41 ng/mL (ref 11–307)

## 2021-07-28 NOTE — Progress Notes (Signed)
?    [place] ?[phone]   [phone] ? ?OFFICE PROGRESS NOTE ? ?[name], NP ?[address] ?[place] [address] ? ?DIAGNOSIS: Iron deficiency anemia ?  ?PRIOR THERAPY: None ?  ?CURRENT THERAPY:  ?1) IV iron PRN with venofer 300 mg weekly for 3 weeks at a time.   ?2) Oral iron supplement with integra plus p.o. daily. ?  ? ?INTERVAL HISTORY: ?[name] Cooper 71 y.o. female returns to the clinic today for follow-up visit.  The patient is feeling fine today with no concerning complaints except for very mild fatigue.  She also complains of swelling in the left thigh area.  She denied having any chest pain, shortness of breath, cough or hemoptysis.  She denied having any fever or chills.  She has no nausea, vomiting, diarrhea or constipation.  She has no headache or visual changes.  She received iron infusion with Venofer at the higher dose recently.  The patient is here today for evaluation and repeat blood work. ? ?MEDICAL HISTORY: ?Past Medical History:  ?Diagnosis Date  ? Anemia   ? Arthritis   ? Cancer [facility])   ? breast and ovarian  ? Chicken pox   ? Chronic lower back pain   ? COPD (chronic obstructive pulmonary disease) ([place])   ? Diabetes ([place])   ? GERD (gastroesophageal reflux disease)   ? Hepatitis 1970's  ? History of recurrent UTIs   ? Hyperlipemia   ? Hypertension   ? Slow heart rate dx'd 09/24/2014  ? Urine incontinence   ? ? ?ALLERGIES:  is allergic to lisinopril and codeine. ? ?MEDICATIONS:  ?Current Outpatient Medications  ?Medication Sig Dispense Refill  ? acetaminophen (TYLENOL) 650 MG CR tablet Take 1,300 mg by mouth every 8 (eight) hours as needed for pain. Reported on 09/23/2015    ? albuterol (VENTOLIN HFA) 108 (90 Base) MCG/ACT inhaler Inhale 2 puffs into the lungs every 6 (six) hours as needed for wheezing or shortness of breath. 1 each 3  ? Ascorbic Acid (VITAMIN C) 500 MG CAPS Take 500 mg by mouth 2 (two) times daily.    ? aspirin EC 81 MG tablet Take 1  tablet (81 mg total) by mouth daily. 90 tablet 3  ? CRANBERRY EXTRACT PO Take 400 mg by mouth 2 (two) times daily. 2 tablets twice a day    ? ezetimibe (ZETIA) 10 MG tablet Take 1 tablet (10 mg total) by mouth daily. 90 tablet 3  ? FeFum-FePoly-FA-B Cmp-C-Biot (INTEGRA PLUS) CAPS Take 1 capsule by mouth every morning. 30 capsule 2  ? isosorbide mononitrate (IMDUR) 30 MG 24 hr tablet Take 1 tablet by mouth once daily 90 tablet 3  ? Lactobacillus (ACIDOPHILUS PO) Take 5 mg by mouth daily. (Patient not taking: Reported on 07/28/2021)    ? linagliptin (TRADJENTA) 5 MG TABS tablet Take 1 tablet (5 mg total) by mouth daily. (Patient not taking: Reported on 07/28/2021) 90 tablet 3  ? Multiple Vitamin (MULTIVITAMIN WITH MINERALS) TABS tablet Take 1 tablet by mouth daily.    ? nitroGLYCERIN (NITROSTAT) 0.4 MG SL tablet Place 1 tablet (0.4 mg total) under the tongue every 5 (five) minutes as needed for chest pain. 25 tablet prn  ? predniSONE (STERAPRED UNI-PAK 21 TAB) 10 MG (21) TBPK tablet Take by mouth as directed. (Patient not taking: Reported on 07/28/2021)    ? rosuvastatin (CRESTOR) 20 MG tablet Take 1 tablet by mouth once daily 21 tablet 1  ? tiZANidine (  ZANAFLEX) 4 MG capsule Take 8 mg by mouth every 8 (eight) hours as needed. (Patient not taking: Reported on 07/28/2021)    ? triamterene-hydrochlorothiazide (MAXZIDE-25) 37.5-25 MG tablet Take 1 tablet by mouth once daily (Patient not taking: Reported on 07/28/2021) 12 tablet 0  ? ?No current facility-administered medications for this visit.  ? ? ?SURGICAL HISTORY:  ?Past Surgical History:  ?Procedure Laterality Date  ? ABDOMINAL HYSTERECTOMY  1984  ? APPENDECTOMY    ? BREAST BIOPSY Right 1996  ? BREAST LUMPECTOMY Right 1996  ? LEFT HEART CATH AND CORONARY ANGIOGRAPHY N/A 01/13/2019  ? Procedure: LEFT HEART CATH AND CORONARY ANGIOGRAPHY;  Surgeon: Nelva Bush, MD;  Location: [facility];  Service: Cardiovascular;  Laterality: N/A;  ? MASTECTOMY Right 1996  ?  TONSILLECTOMY    ? ? ?REVIEW OF SYSTEMS:  Constitutional: positive for fatigue ?Eyes: negative ?Ears, nose, mouth, throat, and face: negative ?Respiratory: negative ?Cardiovascular: negative ?Gastrointestinal: negative ?Genitourinary:negative ?Integument/breast: negative ?Hematologic/lymphatic: negative ?Musculoskeletal:positive for muscle weakness ?Neurological: negative ?Behavioral/Psych: negative ?Endocrine: negative ?Allergic/Immunologic: negative  ? ?PHYSICAL EXAMINATION: General appearance: alert, cooperative, fatigued, and no distress ?Head: Normocephalic, without obvious abnormality, atraumatic ?Neck: no adenopathy, no JVD, supple, symmetrical, trachea midline, and thyroid not enlarged, symmetric, no tenderness/mass/nodules ?Lymph nodes: Cervical, supraclavicular, and axillary nodes normal. ?Resp: clear to auscultation bilaterally ?Back: symmetric, no curvature. ROM normal. No CVA tenderness. ?Cardio: regular rate and rhythm, S1, S2 normal, no murmur, click, rub or gallop ?GI: soft, non-tender; bowel sounds normal; no masses,  no organomegaly ?Extremities: edema 1+ ?Neurologic: Alert and oriented X 3, normal strength and tone. Normal symmetric reflexes. Normal coordination and gait ? ?ECOG PERFORMANCE STATUS: 1 - Symptomatic but completely ambulatory ? ?Blood pressure 138/80, pulse 79, temperature (!) 97.2 ?F (36.2 ?C), temperature source Tympanic, resp. rate 20, height $RemoveBe'5\' 6"'HZtSaLstS$  (1.676 m), weight 280 lb 9.6 oz (127.3 kg), SpO2 95 %. ? ?LABORATORY DATA: ?Lab Results  ?Component Value Date  ? WBC 7.6 07/28/2021  ? HGB 7.9 (L) 07/28/2021  ? HCT 26.9 (L) 07/28/2021  ? MCV 68.4 (L) 07/28/2021  ? PLT 253 07/28/2021  ? ? ?  Chemistry   ?   ?Component Value Date/Time  ? NA 141 07/08/2020 1422  ? NA 140 01/30/2019 1330  ? K 3.7 07/08/2020 1422  ? CL 101 07/08/2020 1422  ? CO2 31 07/08/2020 1422  ? BUN 18 07/08/2020 1422  ? BUN 18 01/30/2019 1330  ? CREATININE 1.20 07/08/2020 1422  ? CREATININE 1.20 (H) 12/21/2019 1336   ? CREATININE 1.28 (H) 09/21/2016 1115  ?    ?Component Value Date/Time  ? CALCIUM 9.5 07/08/2020 1422  ? ALKPHOS 88 07/08/2020 1422  ? AST 13 07/08/2020 1422  ? AST 12 (L) 12/21/2019 1336  ? ALT 10 06/05/2021 0748  ? ALT 8 12/21/2019 1336  ? BILITOT 0.4 07/08/2020 1422  ? BILITOT 0.4 12/21/2019 1336  ?  ? ? ? ?RADIOGRAPHIC STUDIES: ?No results found. ? ?ASSESSMENT AND PLAN: This is a very pleasant 71 years old African-American female with iron deficiency anemia status post Venofer infusion in October 2021.   ?The patient is status post iron infusion with Venofer completed last year and tolerated it fairly well.  She has been on oral iron tablet with Integra +1 capsule p.o. daily. ?The patient was treated with iron infusion with Venofer several times recently even at a higher dose of 400 mg but no significant improvement in her anemia.  Her CBC today showed persistent anemia with  hemoglobin of 7.9.  Iron study and ferritin are within the normal range. ?I will arrange for the patient to receive 1 unit of PRBCs transfusion in the next few days. ?I recommended for the patient to proceed with a bone marrow biopsy and aspirate to rule out any underlying bone marrow abnormalities.  I will also check her hemoglobin electrophoresis. ?For the swelling of the left lower extremities, I will order a Doppler of the lower extremity to rule out deep venous thrombosis. ?The patient will continue her current treatment with Integra +1 capsule p.o. daily for now. ?I will see her back for follow-up visit in 1 months for evaluation and discussion of her biopsy results and other recommendation regarding her condition. ?She was advised to call immediately if she has any other concerning symptoms in the interval. ?The patient voices understanding of current disease status and treatment options and is in agreement with the current care plan. ? ?All questions were answered. The patient knows to call the clinic with any problems, questions or  concerns. We can certainly see the patient much sooner if necessary. ? ?Disclaimer: This note was dictated with voice recognition software. Similar sounding words can inadvertently be transcribed and may no

## 2021-07-28 NOTE — Progress Notes (Signed)
Patient to receive 1 unit of PRBC's on 07/29/21 @ 8:30 AM. Patient instructed to NOT remove blue bracelet prior to infusion. Teach back utilized. ?

## 2021-07-29 ENCOUNTER — Inpatient Hospital Stay: Payer: Medicare Other

## 2021-07-29 DIAGNOSIS — D509 Iron deficiency anemia, unspecified: Secondary | ICD-10-CM

## 2021-07-29 MED ORDER — DIPHENHYDRAMINE HCL 25 MG PO CAPS
25.0000 mg | ORAL_CAPSULE | Freq: Once | ORAL | Status: AC
  Administered 2021-07-29: 25 mg via ORAL
  Filled 2021-07-29: qty 1

## 2021-07-29 MED ORDER — ACETAMINOPHEN 325 MG PO TABS
650.0000 mg | ORAL_TABLET | Freq: Once | ORAL | Status: AC
  Administered 2021-07-29: 650 mg via ORAL
  Filled 2021-07-29: qty 2

## 2021-07-29 MED ORDER — SODIUM CHLORIDE 0.9% IV SOLUTION
250.0000 mL | Freq: Once | INTRAVENOUS | Status: AC
  Administered 2021-07-29: 250 mL via INTRAVENOUS

## 2021-07-29 NOTE — Patient Instructions (Signed)

## 2021-07-30 ENCOUNTER — Ambulatory Visit (HOSPITAL_COMMUNITY)
Admission: RE | Admit: 2021-07-30 | Discharge: 2021-07-30 | Disposition: A | Discharge status: Home / Self Care | Payer: Medicare Other | Source: Ambulatory Visit | Attending: Physician Assistant | Admitting: Physician Assistant

## 2021-07-30 ENCOUNTER — Telehealth: Payer: Self-pay | Admitting: *Deleted

## 2021-07-30 DIAGNOSIS — M7989 Other specified soft tissue disorders: Secondary | ICD-10-CM | POA: Insufficient documentation

## 2021-07-30 NOTE — Telephone Encounter (Signed)
Contacted by Marya Amsler w/ [facility] - Results: There is no evidence of deep vein thrombosis in the lower extremity. ?Message routed to provider ?

## 2021-07-30 NOTE — Progress Notes (Signed)
Left lower extremity venous duplex has been completed. ?Preliminary results can be found in CV Proc through chart review.  ?Results were given to [facility] at Dr. Lew Dawes office. ? ?07/30/21 8:56 AM ?Carlos Levering RVT   ?

## 2021-07-30 NOTE — Telephone Encounter (Signed)
Contacted patient at provider request to let her know results of ultrasound - no DVT. ?Patient verbalized understanding  ?

## 2021-07-31 LAB — TYPE AND SCREEN
ABO/RH(D): O POS
Antibody Screen: POSITIVE
Donor AG Type: NEGATIVE
PT AG Type: NEGATIVE
Unit division: 0

## 2021-07-31 LAB — BPAM RBC
Blood Product Expiration Date: 202305172359
ISSUE DATE / TIME: 202304110928
Unit Type and Rh: 5100

## 2021-08-01 ENCOUNTER — Ambulatory Visit: Payer: Commercial Managed Care - HMO | Admitting: Podiatry

## 2021-08-01 ENCOUNTER — Telehealth: Payer: Self-pay | Admitting: Internal Medicine

## 2021-08-01 NOTE — Telephone Encounter (Signed)
Called patient regarding upcoming appointments, patient is notified. 

## 2021-08-08 ENCOUNTER — Ambulatory Visit (INDEPENDENT_AMBULATORY_CARE_PROVIDER_SITE_OTHER): Payer: Medicare Other | Admitting: Podiatry

## 2021-08-08 ENCOUNTER — Encounter: Payer: Self-pay | Admitting: Internal Medicine

## 2021-08-08 ENCOUNTER — Encounter: Payer: Self-pay | Admitting: Podiatry

## 2021-08-08 DIAGNOSIS — L600 Ingrowing nail: Secondary | ICD-10-CM

## 2021-08-08 DIAGNOSIS — M79675 Pain in left toe(s): Secondary | ICD-10-CM

## 2021-08-08 DIAGNOSIS — M79674 Pain in right toe(s): Secondary | ICD-10-CM | POA: Diagnosis not present

## 2021-08-08 DIAGNOSIS — E119 Type 2 diabetes mellitus without complications: Secondary | ICD-10-CM

## 2021-08-08 DIAGNOSIS — B351 Tinea unguium: Secondary | ICD-10-CM

## 2021-08-08 NOTE — Patient Instructions (Signed)
EPSOM SALT FOOT SOAK INSTRUCTIONS  *IF YOU HAVE BEEN PRESCRIBED ANTIBIOTICS, TAKE AS INSTRUCTED UNTIL ALL ARE GONE*  Shopping List:  A. Plain epsom salt (not scented) B. Neosporin Cream/Ointment or Bacitracin Cream/Ointment (or prescribed antiobiotic drops/cream/ointment) C. 1-inch fabric band-aids   Place 1/4 cup of epsom salts in 2 quarts of warm tap water. IF YOU ARE DIABETIC, OR HAVE NEUROPATHY, CHECK THE TEMPERATURE OF THE WATER WITH YOUR ELBOW.   Submerge your foot/feet in the solution and soak for 10-15 minutes.      3.  Next, remove your foot/feet from solution, blot dry the affected area.    4.  Apply light amount of antibiotic cream/ointment and cover with fabric band-aid .  5.  This soak should be done once a day for 7 days.   6.  Monitor for any signs/symptoms of infection such as redness, swelling, odor, drainage, increased pain, or non-healing of digit.   7.  Please do not hesitate to call the office and speak to a Nurse or Doctor if you have questions.   8.  If you experience fever, chills, nightsweats, nausea or vomiting with worsening of digit/foot, please go to the emergency room.  

## 2021-08-13 ENCOUNTER — Other Ambulatory Visit: Payer: Self-pay | Admitting: Student

## 2021-08-14 ENCOUNTER — Ambulatory Visit (HOSPITAL_COMMUNITY)
Admission: RE | Admit: 2021-08-14 | Discharge: 2021-08-14 | Disposition: A | Discharge status: Home / Self Care | Payer: Medicare Other | Source: Ambulatory Visit | Attending: Internal Medicine | Admitting: Internal Medicine

## 2021-08-14 ENCOUNTER — Encounter (HOSPITAL_COMMUNITY): Payer: Self-pay

## 2021-08-14 ENCOUNTER — Other Ambulatory Visit: Payer: Self-pay

## 2021-08-14 DIAGNOSIS — D509 Iron deficiency anemia, unspecified: Secondary | ICD-10-CM | POA: Insufficient documentation

## 2021-08-14 DIAGNOSIS — I10 Essential (primary) hypertension: Secondary | ICD-10-CM | POA: Insufficient documentation

## 2021-08-14 DIAGNOSIS — D7589 Other specified diseases of blood and blood-forming organs: Secondary | ICD-10-CM | POA: Insufficient documentation

## 2021-08-14 DIAGNOSIS — Z853 Personal history of malignant neoplasm of breast: Secondary | ICD-10-CM | POA: Insufficient documentation

## 2021-08-14 DIAGNOSIS — K759 Inflammatory liver disease, unspecified: Secondary | ICD-10-CM | POA: Diagnosis not present

## 2021-08-14 DIAGNOSIS — Z8543 Personal history of malignant neoplasm of ovary: Secondary | ICD-10-CM | POA: Insufficient documentation

## 2021-08-14 DIAGNOSIS — Z809 Family history of malignant neoplasm, unspecified: Secondary | ICD-10-CM | POA: Diagnosis not present

## 2021-08-14 DIAGNOSIS — Z8619 Personal history of other infectious and parasitic diseases: Secondary | ICD-10-CM | POA: Insufficient documentation

## 2021-08-14 DIAGNOSIS — M1990 Unspecified osteoarthritis, unspecified site: Secondary | ICD-10-CM | POA: Insufficient documentation

## 2021-08-14 DIAGNOSIS — E785 Hyperlipidemia, unspecified: Secondary | ICD-10-CM | POA: Insufficient documentation

## 2021-08-14 DIAGNOSIS — Z8261 Family history of arthritis: Secondary | ICD-10-CM | POA: Insufficient documentation

## 2021-08-14 DIAGNOSIS — K219 Gastro-esophageal reflux disease without esophagitis: Secondary | ICD-10-CM | POA: Diagnosis not present

## 2021-08-14 DIAGNOSIS — J449 Chronic obstructive pulmonary disease, unspecified: Secondary | ICD-10-CM | POA: Diagnosis not present

## 2021-08-14 DIAGNOSIS — E119 Type 2 diabetes mellitus without complications: Secondary | ICD-10-CM | POA: Insufficient documentation

## 2021-08-14 LAB — GLUCOSE, CAPILLARY
Glucose-Capillary: 119 mg/dL — ABNORMAL HIGH (ref 70–99)
Glucose-Capillary: 102 mg/dL — ABNORMAL HIGH (ref 70–99)

## 2021-08-14 LAB — CBC WITH DIFFERENTIAL/PLATELET
Abs Immature Granulocytes: 0 10*3/uL (ref 0.00–0.07)
Band Neutrophils: 2 %
Basophils Absolute: 0 10*3/uL (ref 0.0–0.1)
Basophils Relative: 0 %
Eosinophils Absolute: 0.3 10*3/uL (ref 0.0–0.5)
Eosinophils Relative: 4 %
HCT: 28.3 % — ABNORMAL LOW (ref 36.0–46.0)
Hemoglobin: 8.2 g/dL — ABNORMAL LOW (ref 12.0–15.0)
Lymphocytes Relative: 28 %
Lymphs Abs: 2.2 10*3/uL (ref 0.7–4.0)
MCH: 20.2 pg — ABNORMAL LOW (ref 26.0–34.0)
MCHC: 29 g/dL — ABNORMAL LOW (ref 30.0–36.0)
MCV: 69.7 fL — ABNORMAL LOW (ref 80.0–100.0)
Monocytes Absolute: 0.8 10*3/uL (ref 0.1–1.0)
Monocytes Relative: 10 %
Neutro Abs: 4.6 10*3/uL (ref 1.7–7.7)
Neutrophils Relative %: 56 %
Platelets: 256 10*3/uL (ref 150–400)
RBC: 4.06 MIL/uL (ref 3.87–5.11)
RDW: 23.8 % — ABNORMAL HIGH (ref 11.5–15.5)
WBC: 8 10*3/uL (ref 4.0–10.5)
nRBC: 0 % (ref 0.0–0.2)

## 2021-08-14 MED ORDER — MIDAZOLAM HCL 2 MG/2ML IJ SOLN
INTRAMUSCULAR | Status: AC | PRN
  Administered 2021-08-14: 1 mg via INTRAVENOUS

## 2021-08-14 MED ORDER — NALOXONE HCL 0.4 MG/ML IJ SOLN
INTRAMUSCULAR | Status: AC
  Filled 2021-08-14: qty 1

## 2021-08-14 MED ORDER — FENTANYL CITRATE (PF) 100 MCG/2ML IJ SOLN
INTRAMUSCULAR | Status: AC
  Filled 2021-08-14: qty 2

## 2021-08-14 MED ORDER — FENTANYL CITRATE (PF) 100 MCG/2ML IJ SOLN
INTRAMUSCULAR | Status: AC | PRN
  Administered 2021-08-14: 50 ug via INTRAVENOUS

## 2021-08-14 MED ORDER — FLUMAZENIL 0.5 MG/5ML IV SOLN
INTRAVENOUS | Status: AC
  Filled 2021-08-14: qty 5

## 2021-08-14 MED ORDER — MIDAZOLAM HCL 2 MG/2ML IJ SOLN
INTRAMUSCULAR | Status: AC
  Filled 2021-08-14: qty 2

## 2021-08-14 MED ORDER — LIDOCAINE HCL 1 % IJ SOLN
INTRAMUSCULAR | Status: AC | PRN
  Administered 2021-08-14: 10 mL via INTRADERMAL

## 2021-08-14 MED ORDER — SODIUM CHLORIDE 0.9 % IV SOLN: INTRAVENOUS | Status: DC

## 2021-08-14 NOTE — Discharge Instructions (Addendum)
Please call Interventional Radiology clinic [phone] with any questions or concernss ? ?You may remove your dressing and shower tomorrow. ?

## 2021-08-14 NOTE — Consult Note (Signed)
? ?Chief Complaint: ?Patient was seen in consultation today for  CT guided bone marrow biopsy ? ?Referring Physician(s): ?Mohamed,Mohamed ? ?Supervising Physician: Corrie Mckusick ? ?Patient Status: [place] ? ?History of Present Illness: ?[name] Cooper is a 71 y.o. female with PMH sig for arthritis, breast and ovarian cancer, COPD, DM, GERD, hepatitis, HLD,HTN and now with persistent anemia despite iron infusions/supplementation. She is scheduled today for CT guided bone marrow biopsy. ? ?Past Medical History:  ?Diagnosis Date  ? Anemia   ? Arthritis   ? Cancer [facility])   ? breast and ovarian  ? Chicken pox   ? Chronic lower back pain   ? COPD (chronic obstructive pulmonary disease) ([place])   ? Diabetes ([place])   ? GERD (gastroesophageal reflux disease)   ? Hepatitis 1970's  ? History of recurrent UTIs   ? Hyperlipemia   ? Hypertension   ? Slow heart rate dx'd 09/24/2014  ? Urine incontinence   ? ? ?Past Surgical History:  ?Procedure Laterality Date  ? ABDOMINAL HYSTERECTOMY  1984  ? APPENDECTOMY    ? BREAST BIOPSY Right 1996  ? BREAST LUMPECTOMY Right 1996  ? LEFT HEART CATH AND CORONARY ANGIOGRAPHY N/A 01/13/2019  ? Procedure: LEFT HEART CATH AND CORONARY ANGIOGRAPHY;  Surgeon: Nelva Bush, MD;  Location: [place] [facility];  Service: Cardiovascular;  Laterality: N/A;  ? MASTECTOMY Right 1996  ? TONSILLECTOMY    ? ? ?Allergies: ?Lisinopril and Codeine ? ?Medications: ?Prior to Admission medications   ?Medication Sig Start Date End Date Taking? Authorizing Provider  ?albuterol (VENTOLIN HFA) 108 (90 Base) MCG/ACT inhaler Inhale 2 puffs into the lungs every 6 (six) hours as needed for wheezing or shortness of breath. 07/08/20  Yes [name], MD  ?Ascorbic Acid (VITAMIN C) 500 MG CAPS Take 500 mg by mouth 2 (two) times daily.   Yes [provider]  ?aspirin EC 81 MG tablet Take 1 tablet (81 mg total) by mouth daily. 01/11/19  Yes Jerline Pain, MD  ?ezetimibe (ZETIA) 10 MG tablet Take 1 tablet (10 mg  total) by mouth daily. 03/03/21  Yes Jerline Pain, MD  ?FeFum-FePoly-FA-B Cmp-C-Biot (INTEGRA PLUS) CAPS Take 1 capsule by mouth every morning. 07/31/19  Yes Heilingoetter, Cassandra L, PA-C  ?isosorbide mononitrate (IMDUR) 30 MG 24 hr tablet Take 1 tablet by mouth once daily 03/24/21  Yes Jerline Pain, MD  ?rosuvastatin (CRESTOR) 20 MG tablet Take 1 tablet by mouth once daily 11/29/20  Yes Marin Olp, MD  ?traMADol (ULTRAM) 50 MG tablet Take 50 mg by mouth 2 (two) times daily as needed. 05/16/21  Yes [provider]  ?acetaminophen (TYLENOL) 650 MG CR tablet Take 1,300 mg by mouth every 8 (eight) hours as needed for pain. Reported on 09/23/2015    [provider]  ?CRANBERRY EXTRACT PO Take 400 mg by mouth 2 (two) times daily. 2 tablets twice a day    [provider]  ?Lactobacillus (ACIDOPHILUS PO) Take 5 mg by mouth daily. ?Patient not taking: Reported on 07/28/2021    [provider]  ?Multiple Vitamin (MULTIVITAMIN WITH MINERALS) TABS tablet Take 1 tablet by mouth daily.    [provider]  ?nitroGLYCERIN (NITROSTAT) 0.4 MG SL tablet Place 1 tablet (0.4 mg total) under the tongue every 5 (five) minutes as needed for chest pain. 07/08/20 07/08/21  [name], MD  ?predniSONE (STERAPRED UNI-PAK 21 TAB) 10 MG (21) TBPK tablet Take by mouth as directed. ?Patient not taking: Reported  on 07/28/2021 03/17/21   [provider]  ?tiZANidine (ZANAFLEX) 4 MG capsule Take 8 mg by mouth every 8 (eight) hours as needed. ?Patient not taking: Reported on 07/28/2021 03/18/21   [provider]  ?  ? ?Family History  ?Problem Relation Age of Onset  ? Cancer Mother   ? Early death Mother   ? Arthritis Brother   ? Colon cancer Neg Hx   ? Esophageal cancer Neg Hx   ? Rectal cancer Neg Hx   ? Stomach cancer Neg Hx   ? ? ?Social History  ? ?Socioeconomic History  ? Marital status: Widowed  ?  Spouse name: Not on file  ? Number of children: 1  ? Years of education: Not  on file  ? Highest education level: Not on file  ?Occupational History  ? Occupation: Retired   ?  Employer: [employer]  ?Tobacco Use  ? Smoking status: Every Day  ?  Packs/day: 0.25  ?  Years: 47.00  ?  Pack years: 11.75  ?  Types: Cigarettes  ? Smokeless tobacco: Never  ? Tobacco comments:  ?  quit date: 01/19/2019  ?Vaping Use  ? Vaping Use: Never used  ?Substance and Sexual Activity  ? Alcohol use: No  ?  Alcohol/week: 0.0 standard drinks  ? Drug use: No  ?  Types: "Crack" cocaine  ?  Comment: 09/24/2014 "last crack was in 2013"  ? Sexual activity: Not on file  ?Other Topics Concern  ? Not on file  ?Social History Narrative  ? 3 grand children   ? 9 great grandchildren   ? ?Social Determinants of Health  ? ?Financial Resource Strain: Not on file  ?Food Insecurity: Not on file  ?Transportation Needs: Not on file  ?Physical Activity: Not on file  ?Stress: Not on file  ?Social Connections: Not on file  ? ? ? ? ?Review of Systems denies fever,HA,CP,dyspnea, cough, abd/back pain,N/V or bleeding ? ?Vital Signs: ?BP (!) 145/77 (BP Location: Left Arm)   Pulse 63   Temp 98 ?F (36.7 ?C) (Oral)   Resp 18   LMP  (LMP Unknown)   SpO2 95%  ? ?Physical Exam awake/alert; chest- CTA bilat; heart- RRR; abd- soft,+BS,NT; bilat LE edema noted ? ?Imaging: ?VAS [place] LOWER EXTREMITY VENOUS (DVT) ? ?Result Date: 07/30/2021 ? Lower Venous DVT Study Patient Name:  [name] Cooper  Date of Exam:   07/30/2021 Medical Rec #: [id number]        Accession #:    [id number] Date of Birth: 1950/11/27        Patient Gender: F Patient Age:   70 years Exam [facility] Procedure:      VAS [place] LOWER EXTREMITY VENOUS (DVT) Referring Phys: Roosevelt Locks --------------------------------------------------------------------------------  Indications: Swelling.  Risk Factors: Cancer. Limitations: Body habitus and poor ultrasound/tissue interface. Comparison Study: No prior studies. Performing Technologist: Oliver Hum  RVT  Examination Guidelines: A complete evaluation includes B-mode imaging, spectral Doppler, color Doppler, and power Doppler as needed of all accessible portions of each vessel. Bilateral testing is considered an integral part of a complete examination. Limited examinations for reoccurring indications may be performed as noted. The reflux portion of the exam is performed with the patient in reverse Trendelenburg.  +-----+---------------+---------+-----------+----------+--------------+ RIGHTCompressibilityPhasicitySpontaneityPropertiesThrombus Aging +-----+---------------+---------+-----------+----------+--------------+ CFV  Full           Yes      Yes                                 +-----+---------------+---------+-----------+----------+--------------+   +---------+---------------+---------+-----------+----------+--------------+  LEFT     CompressibilityPhasicitySpontaneityPropertiesThrombus Aging +---------+---------------+---------+-----------+----------+--------------+ CFV      Full           Yes      Yes                                 +---------+---------------+---------+-----------+----------+--------------+ SFJ      Full                                                        +---------+---------------+---------+-----------+----------+--------------+ FV Prox  Full                                                        +---------+---------------+---------+-----------+----------+--------------+ FV Mid   Full                                                        +---------+---------------+---------+-----------+----------+--------------+ FV Distal               Yes      Yes                                 +---------+---------------+---------+-----------+----------+--------------+ PFV      Full                                                        +---------+---------------+---------+-----------+----------+--------------+ POP      Full            Yes      Yes                                 +---------+---------------+---------+-----------+----------+--------------+ PTV      Full                                                        +---------+---------------+---------+-----------+----------+--------------+ PERO

## 2021-08-14 NOTE — Procedures (Signed)
Vascular and Interventional Radiology Procedure Note ? ?Patient: [name] Cooper ?DOB: [birth year] ?Medical Record Number: [id number] ?Note Date/Time: 08/14/21 9:13 AM  ? ?Performing Physician: Michaelle Birks, MD ?Assistant(s): None ? ?Diagnosis:  Anemia ? ?Procedure: BONE MARROW ASPIRATION and BIOPSY ? ?Anesthesia: Conscious Sedation ?Complications: None ?Estimated Blood Loss: Minimal ?Specimens: Sent for Pathology ? ?Findings:  ?Successful CT-guided bone marrow biopsy ?A total of 1 cores were obtained. ?Hemostasis of the tract was achieved using Manual Pressure. ? ?Plan: Bed rest for 1 hours. ? ?See detailed procedure note with images in PACS. ?The patient tolerated the procedure well without incident or complication and was returned to Recovery in stable condition.  ? ? ?Michaelle Birks, MD ?[facility] ?[facility] ? ? ?Pager. [phone] ?Clinic. [phone]  ?

## 2021-08-17 NOTE — Progress Notes (Signed)
?  Subjective:  ?Patient ID: [name] Cooper, female    DOB: 1951-04-10,  MRN: [id number] ? ?[name] Cooper presents to clinic today for preventative diabetic foot care and painful elongated mycotic toenails 1-5 bilaterally which are tender when wearing enclosed shoe gear. Pain is relieved with periodic professional debridement. ? ?New problem(s): None.  ? ?PCP is [name], NP , and last visit was August 04, 2021. ? ?Allergies  ?Allergen Reactions  ? Lisinopril Anaphylaxis  ? Codeine   ?  REACTION: MAKES HER SLEEPY  ? ? ?Review of Systems: Negative except as noted in the HPI. ? ?Objective: No changes noted in today's physical examination. ?[name] Cooper is a pleasant 71 y.o. female in NAD. AAO X 3. ? ?Vascular Examination: ?CFT immediate b/l LE. Palpable DP/PT pulses b/l LE. Digital hair sparse b/l. Skin temperature gradient WNL b/l. No pain with calf compression b/l. No edema noted b/l. No cyanosis or clubbing noted b/l LE. ? ?Dermatological Examination: ?Pedal integument with normal turgor, texture and tone b/l LE. No open wounds b/l. No interdigital macerations b/l. Toenails 2-5 b/l  and left great toe elongated, thickened, discolored with subungual debris. +Tenderness with dorsal palpation of nailplates. No hyperkeratotic or porokeratotic lesions present. Incurvated nailplate right great toe medial  border(s) with tenderness to palpation. No erythema, no edema, no drainage noted.  ? ?Musculoskeletal Examination: ?Normal muscle strength 5/5 to all lower extremity muscle groups bilaterally. No pain, crepitus or joint limitation noted with ROM b/l LE. No gross bony pedal deformities b/l. Patient ambulates independently without assistive aids. ? ?Neurological Examination: ?Protective sensation intact 5/5 intact bilaterally with 10g monofilament b/l. Vibratory sensation intact b/l. ? ?Assessment/Plan: ?1. Pain due to onychomycosis of toenails of both feet   ?2. Ingrown toenail without infection   ?3. Diabetes  mellitus without complication ([place])   ?-Patient was evaluated and treated. All patient's and/or POA's questions/concerns answered on today's visit. ?-Toenails 2-5 bilaterally and left great toe debrided in length and girth without iatrogenic bleeding with sterile nail nipper and dremel.  ?-Offending nail border debrided and curretaged R hallux utilizing sterile nail nipper and currette. Border(s) cleansed with alcohol and TAO applied. Dispensed written instructions for once daily epsom salt soaks for 7 days. ?-Patient/POA to call should there be question/concern in the interim.  ? ?Return in about 3 months (around 11/07/2021). ? ?Marzetta Board, DPM  ?

## 2021-08-25 ENCOUNTER — Telehealth: Payer: Self-pay | Admitting: Medical Oncology

## 2021-08-25 ENCOUNTER — Encounter (HOSPITAL_COMMUNITY): Payer: Self-pay | Admitting: Internal Medicine

## 2021-08-25 ENCOUNTER — Inpatient Hospital Stay: Payer: Medicare Other

## 2021-08-25 ENCOUNTER — Inpatient Hospital Stay: Payer: Medicare Other | Admitting: Internal Medicine

## 2021-08-25 NOTE — Telephone Encounter (Signed)
Car trouble today -cancel appt for today . Schedule message sent for appts next week. ?

## 2021-08-26 ENCOUNTER — Telehealth: Payer: Self-pay | Admitting: Internal Medicine

## 2021-08-26 LAB — SURGICAL PATHOLOGY

## 2021-08-26 NOTE — Telephone Encounter (Signed)
.  Called patient to schedule appointment per 5/9 inb, patient is aware of date and time.   ?

## 2021-09-01 NOTE — Progress Notes (Signed)
Standard of care study who went there is a concern about MDS. ?

## 2021-09-03 ENCOUNTER — Other Ambulatory Visit: Payer: Self-pay

## 2021-09-03 ENCOUNTER — Other Ambulatory Visit: Payer: Self-pay | Admitting: Urology

## 2021-09-03 ENCOUNTER — Inpatient Hospital Stay (HOSPITAL_BASED_OUTPATIENT_CLINIC_OR_DEPARTMENT_OTHER): Payer: Medicare Other | Admitting: Internal Medicine

## 2021-09-03 ENCOUNTER — Inpatient Hospital Stay: Payer: Medicare Other | Attending: Internal Medicine

## 2021-09-03 ENCOUNTER — Telehealth: Payer: Self-pay | Admitting: Cardiology

## 2021-09-03 VITALS — BP 130/76 | HR 84 | Temp 97.5°F | Resp 16 | Wt 271.3 lb

## 2021-09-03 DIAGNOSIS — D509 Iron deficiency anemia, unspecified: Secondary | ICD-10-CM | POA: Insufficient documentation

## 2021-09-03 LAB — CBC WITH DIFFERENTIAL (CANCER CENTER ONLY)
Abs Immature Granulocytes: 0.03 10*3/uL (ref 0.00–0.07)
Basophils Absolute: 0 10*3/uL (ref 0.0–0.1)
Basophils Relative: 0 %
Eosinophils Absolute: 0.6 10*3/uL — ABNORMAL HIGH (ref 0.0–0.5)
Eosinophils Relative: 6 %
HCT: 27.7 % — ABNORMAL LOW (ref 36.0–46.0)
Hemoglobin: 8.1 g/dL — ABNORMAL LOW (ref 12.0–15.0)
Immature Granulocytes: 0 %
Lymphocytes Relative: 29 %
Lymphs Abs: 2.9 10*3/uL (ref 0.7–4.0)
MCH: 19.3 pg — ABNORMAL LOW (ref 26.0–34.0)
MCHC: 29.2 g/dL — ABNORMAL LOW (ref 30.0–36.0)
MCV: 66.1 fL — ABNORMAL LOW (ref 80.0–100.0)
Monocytes Absolute: 0.8 10*3/uL (ref 0.1–1.0)
Monocytes Relative: 8 %
Neutro Abs: 5.5 10*3/uL (ref 1.7–7.7)
Neutrophils Relative %: 57 %
Platelet Count: 265 10*3/uL (ref 150–400)
RBC: 4.19 MIL/uL (ref 3.87–5.11)
RDW: 24 % — ABNORMAL HIGH (ref 11.5–15.5)
WBC Count: 9.9 10*3/uL (ref 4.0–10.5)
nRBC: 0.2 % (ref 0.0–0.2)

## 2021-09-03 NOTE — Telephone Encounter (Signed)
? ?  Pre-operative Risk Assessment  ?  ?Patient Name: [name] Cooper  ?DOB: 04-11-1951 ?MRN: [id number]  ? ?  ? ?Request for Surgical Clearance   ? ?Procedure:   Cysto Left Ureteroscopy with Left Stent Placement ? ?Date of Surgery:  Clearance 09/18/21                              ?   ?Surgeon:  Dr. Louis Meckel ?Surgeon's Group or Practice Name:  [facility] ?Phone number:  [phone] ?Fax number:  [phone] ?  ?Type of Clearance Requested:   ?- Pharmacy:  Hold Aspirin 5 days ?  ?Type of Anesthesia:  General  ?  ?Additional requests/questions:  Please advise surgeon/provider what medications should be held. ? ?Signed, ?Belisicia T Harris   ?09/03/2021, 3:14 PM  ? ?

## 2021-09-03 NOTE — Progress Notes (Signed)
?    [place] ?[phone]   [phone] ? ?OFFICE PROGRESS NOTE ? ?[name], NP ?[address] ?[place] [address] ? ?DIAGNOSIS: Iron deficiency anemia ?Bone marrow biopsy and aspirate in April 2023 was nonspecific but could not exclude early MDS. ?  ?PRIOR THERAPY: None ?  ?CURRENT THERAPY:  ?1) IV iron PRN with venofer 300 mg weekly for 3 weeks at a time.   ?2) Oral iron supplement with integra plus p.o. daily. ?  ? ?INTERVAL HISTORY: ?[name] Cooper 71 y.o. female returns to the clinic today for follow-up visit.  The patient is feeling fine today with no concerning complaints except for mild fatigue.  The patient denied having any current chest pain, shortness of breath, cough or hemoptysis.  She denied having any fever or chills.  She has no nausea, vomiting, diarrhea or constipation.  She has no headache or visual changes.  She denied having any recent weight loss or night sweats.  She continues to tolerate her treatment with Integra plus fairly well.  She has no recent bleeding, bruises or ecchymosis.  She had a bone marrow biopsy and aspirate performed on 08/14/2021 and she is here for evaluation and discussion of her biopsy results and recommendation regarding her condition. ? ?MEDICAL HISTORY: ?Past Medical History:  ?Diagnosis Date  ? Anemia   ? Arthritis   ? Cancer [facility])   ? breast and ovarian  ? Chicken pox   ? Chronic lower back pain   ? COPD (chronic obstructive pulmonary disease) ([place])   ? Diabetes ([place])   ? GERD (gastroesophageal reflux disease)   ? Hepatitis 1970's  ? History of recurrent UTIs   ? Hyperlipemia   ? Hypertension   ? Slow heart rate dx'd 09/24/2014  ? Urine incontinence   ? ? ?ALLERGIES:  is allergic to lisinopril and codeine. ? ?MEDICATIONS:  ?Current Outpatient Medications  ?Medication Sig Dispense Refill  ? acetaminophen (TYLENOL) 650 MG CR tablet Take 1,300 mg by mouth every 8 (eight) hours as needed for pain. Reported on 09/23/2015    ?  albuterol (VENTOLIN HFA) 108 (90 Base) MCG/ACT inhaler Inhale 2 puffs into the lungs every 6 (six) hours as needed for wheezing or shortness of breath. 1 each 3  ? Ascorbic Acid (VITAMIN C) 500 MG CAPS Take 500 mg by mouth 2 (two) times daily.    ? aspirin EC 81 MG tablet Take 1 tablet (81 mg total) by mouth daily. 90 tablet 3  ? CRANBERRY EXTRACT PO Take 400 mg by mouth 2 (two) times daily. 2 tablets twice a day    ? ezetimibe (ZETIA) 10 MG tablet Take 1 tablet (10 mg total) by mouth daily. 90 tablet 3  ? FeFum-FePoly-FA-B Cmp-C-Biot (INTEGRA PLUS) CAPS Take 1 capsule by mouth every morning. 30 capsule 2  ? furosemide (LASIX) 20 MG tablet Take 20 mg by mouth daily as needed.    ? isosorbide mononitrate (IMDUR) 30 MG 24 hr tablet Take 1 tablet by mouth once daily 90 tablet 3  ? Multiple Vitamin (MULTIVITAMIN WITH MINERALS) TABS tablet Take 1 tablet by mouth daily.    ? rosuvastatin (CRESTOR) 20 MG tablet Take 1 tablet by mouth once daily 21 tablet 1  ? traMADol (ULTRAM) 50 MG tablet Take 50 mg by mouth 2 (two) times daily as needed.    ? ciprofloxacin (CIPRO) 500 MG tablet Take 500 mg by mouth 2 (two) times daily.    ? nitroGLYCERIN (NITROSTAT)  0.4 MG SL tablet Place 1 tablet (0.4 mg total) under the tongue every 5 (five) minutes as needed for chest pain. 25 tablet prn  ? ?No current facility-administered medications for this visit.  ? ? ?SURGICAL HISTORY:  ?Past Surgical History:  ?Procedure Laterality Date  ? ABDOMINAL HYSTERECTOMY  1984  ? APPENDECTOMY    ? BREAST BIOPSY Right 1996  ? BREAST LUMPECTOMY Right 1996  ? LEFT HEART CATH AND CORONARY ANGIOGRAPHY N/A 01/13/2019  ? Procedure: LEFT HEART CATH AND CORONARY ANGIOGRAPHY;  Surgeon: Nelva Bush, MD;  Location: [facility];  Service: Cardiovascular;  Laterality: N/A;  ? MASTECTOMY Right 1996  ? TONSILLECTOMY    ? ? ?REVIEW OF SYSTEMS:  A comprehensive review of systems was negative except for: Constitutional: positive for fatigue  ? ?PHYSICAL  EXAMINATION: General appearance: alert, cooperative, fatigued, and no distress ?Head: Normocephalic, without obvious abnormality, atraumatic ?Neck: no adenopathy, no JVD, supple, symmetrical, trachea midline, and thyroid not enlarged, symmetric, no tenderness/mass/nodules ?Lymph nodes: Cervical, supraclavicular, and axillary nodes normal. ?Resp: clear to auscultation bilaterally ?Back: symmetric, no curvature. ROM normal. No CVA tenderness. ?Cardio: regular rate and rhythm, S1, S2 normal, no murmur, click, rub or gallop ?GI: soft, non-tender; bowel sounds normal; no masses,  no organomegaly ?Extremities: extremities normal, atraumatic, no cyanosis or edema ? ?ECOG PERFORMANCE STATUS: 1 - Symptomatic but completely ambulatory ? ?Blood pressure 130/76, pulse 84, temperature (!) 97.5 ?F (36.4 ?C), temperature source Tympanic, resp. rate 16, weight 271 lb 5 oz (123.1 kg), SpO2 95 %. ? ?LABORATORY DATA: ?Lab Results  ?Component Value Date  ? WBC 9.9 09/03/2021  ? HGB 8.1 (L) 09/03/2021  ? HCT 27.7 (L) 09/03/2021  ? MCV 66.1 (L) 09/03/2021  ? PLT 265 09/03/2021  ? ? ?  Chemistry   ?   ?Component Value Date/Time  ? NA 141 07/08/2020 1422  ? NA 140 01/30/2019 1330  ? K 3.7 07/08/2020 1422  ? CL 101 07/08/2020 1422  ? CO2 31 07/08/2020 1422  ? BUN 18 07/08/2020 1422  ? BUN 18 01/30/2019 1330  ? CREATININE 1.20 07/08/2020 1422  ? CREATININE 1.20 (H) 12/21/2019 1336  ? CREATININE 1.28 (H) 09/21/2016 1115  ?    ?Component Value Date/Time  ? CALCIUM 9.5 07/08/2020 1422  ? ALKPHOS 88 07/08/2020 1422  ? AST 13 07/08/2020 1422  ? AST 12 (L) 12/21/2019 1336  ? ALT 10 06/05/2021 0748  ? ALT 8 12/21/2019 1336  ? BILITOT 0.4 07/08/2020 1422  ? BILITOT 0.4 12/21/2019 1336  ?  ? ? ? ?RADIOGRAPHIC STUDIES: ?CT Biopsy ? ?Result Date: 08/14/2021 ?INDICATION: Anemia. EXAM: CT GUIDED BONE MARROW ASPIRATION AND CORE BIOPSY RADIATION DOSE REDUCTION: This exam was performed according to the departmental dose-optimization program which includes  automated exposure control, adjustment of the mA and/or kV according to patient size and/or use of iterative reconstruction technique. MEDICATIONS: None. ANESTHESIA/SEDATION: Moderate (conscious) sedation was employed during this procedure. A total of 2 milligrams versed and 100 micrograms fentanyl were administered intravenously. The patient's level of consciousness and vital signs were monitored continuously by radiology nursing throughout the procedure under my direct supervision. Total monitored sedation time: 16 minutes FLUOROSCOPY TIME:  CT dose in mGy was not provided. COMPLICATIONS: None immediate. Estimated blood loss: <5 mL PROCEDURE: Informed written consent was obtained from the patient after a thorough discussion of the procedural risks, benefits and alternatives. All questions were addressed. Maximal Sterile Barrier Technique was utilized including caps, mask, sterile gowns, sterile gloves,  sterile drape, hand hygiene and skin antiseptic. A timeout was performed prior to the initiation of the procedure. The patient was positioned prone and non-contrast localization CT was performed of the pelvis to demonstrate the iliac marrow spaces. Maximal barrier sterile technique utilized including caps, mask, sterile gowns, sterile gloves, large sterile drape, hand hygiene, and chlorhexidine prep. Under sterile conditions and local anesthesia, an 11 gauge coaxial bone biopsy needle was advanced into the RIGHT iliac marrow space. Needle position was confirmed with CT imaging. Initially, bone marrow aspiration was performed. Next, the 11 gauge outer cannula was utilized to obtain a 1 iliac bone marrow core biopsy. Needle was removed. Hemostasis was obtained with compression. The patient tolerated the procedure well. Samples were prepared with the cytotechnologist. IMPRESSION: Successful CT guided bone marrow aspiration and biopsy, as above. Michaelle Birks, MD [facility]  [facility] Radiology Electronically Signed   By: Michaelle Birks M.D.   On: 08/14/2021 11:08  ? ?CT BONE MARROW BIOPSY & ASPIRATION ? ?Result Date: 08/14/2021 ?INDICATION: Anemia. EXAM: CT GUIDED BONE MARROW ASPIRATION AND CORE

## 2021-09-04 NOTE — Telephone Encounter (Signed)
   Patient Name: [name] Cooper  DOB: Sep 08, 1950 MRN: [id number]  Primary Cardiologist: Candee Furbish, MD  Chart reviewed as part of pre-operative protocol coverage.  Patient has h/o CAD with 80% calcified LAD lesion with otherwise nonobstructive disease in LCx/RCA by cath 12/2018, HLD, DM, emphysema, former tobcco, morbid obesity per records. Will route to Dr. Marlou Porch on if OK to hold ASA for Cysto Left Ureteroscopy with Left Stent Placement - Dr. Marlou Porch - Please route response to P CV DIV PREOP (the pre-op pool). Thank you.  Then we will plan a VV to ensure no new symptoms.  Charlie Pitter, PA-C 09/04/2021, 1:35 PM

## 2021-09-05 ENCOUNTER — Telehealth: Payer: Self-pay | Admitting: *Deleted

## 2021-09-05 LAB — HGB FRACTIONATION CASCADE
Hgb A2: 1.7 % — ABNORMAL LOW (ref 1.8–3.2)
Hgb A: 98.3 % (ref 96.4–98.8)
Hgb F: 0 % (ref 0.0–2.0)
Hgb S: 0 %

## 2021-09-05 NOTE — Telephone Encounter (Signed)
Pt agreeable to plan of care, tele pre op appt 09/08/21 @ 1 pm. Med rec and consent are done.     Patient Consent for Virtual Visit        [name] Cooper has provided verbal consent on 09/05/2021 for a virtual visit (video or telephone).   CONSENT FOR VIRTUAL VISIT FOR:  [name] Cooper  By participating in this virtual visit I agree to the following:  I hereby voluntarily request, consent and authorize [facility] and its employed or contracted physicians, physician assistants, nurse practitioners or other licensed health care professionals (the Practitioner), to provide me with telemedicine health care services (the "Services") as deemed necessary by the treating Practitioner. I acknowledge and consent to receive the Services by the Practitioner via telemedicine. I understand that the telemedicine visit will involve communicating with the Practitioner through live audiovisual communication technology and the disclosure of certain medical information by electronic transmission. I acknowledge that I have been given the opportunity to request an in-person assessment or other available alternative prior to the telemedicine visit and am voluntarily participating in the telemedicine visit.  I understand that I have the right to withhold or withdraw my consent to the use of telemedicine in the course of my care at any time, without affecting my right to future care or treatment, and that the Practitioner or I may terminate the telemedicine visit at any time. I understand that I have the right to inspect all information obtained and/or recorded in the course of the telemedicine visit and may receive copies of available information for a reasonable fee.  I understand that some of the potential risks of receiving the Services via telemedicine include:  Delay or interruption in medical evaluation due to technological equipment failure or disruption; Information transmitted may not be sufficient (e.g. poor  resolution of images) to allow for appropriate medical decision making by the Practitioner; and/or  In rare instances, security protocols could fail, causing a breach of personal health information.  Furthermore, I acknowledge that it is my responsibility to provide information about my medical history, conditions and care that is complete and accurate to the best of my ability. I acknowledge that Practitioner's advice, recommendations, and/or decision may be based on factors not within their control, such as incomplete or inaccurate data provided by me or distortions of diagnostic images or specimens that may result from electronic transmissions. I understand that the practice of medicine is not an exact science and that Practitioner makes no warranties or guarantees regarding treatment outcomes. I acknowledge that a copy of this consent can be made available to me via my patient portal ([facility]), or I can request a printed copy by calling the office of [facility].    I understand that my insurance will be billed for this visit.   I have read or had this consent read to me. I understand the contents of this consent, which adequately explains the benefits and risks of the Services being provided via telemedicine.  I have been provided ample opportunity to ask questions regarding this consent and the Services and have had my questions answered to my satisfaction. I give my informed consent for the services to be provided through the use of telemedicine in my medical care

## 2021-09-05 NOTE — Telephone Encounter (Signed)
Pt agreeable to plan of care, tele pre op appt 09/08/21 @ 1 pm. Med rec and consent are done.

## 2021-09-05 NOTE — Telephone Encounter (Signed)
    Name: [name] Cooper  DOB: 07/07/1950  MRN: [id number]  Primary Cardiologist: Candee Furbish, MD  Last OV 02/2021.  Preoperative team, please contact this patient and set up a phone call appointment for further preoperative risk assessment. Please obtain consent and complete medication review. Thank you for your help.  I confirm that guidance regarding antiplatelet and oral anticoagulation therapy has been completed and, if necessary, noted below.    Charlie Pitter, PA-C 09/05/2021, 8:55 AM [facility] [address] [place], [place] [address]

## 2021-09-08 ENCOUNTER — Ambulatory Visit (INDEPENDENT_AMBULATORY_CARE_PROVIDER_SITE_OTHER): Payer: Medicare Other | Admitting: Nurse Practitioner

## 2021-09-08 ENCOUNTER — Encounter: Payer: Self-pay | Admitting: Nurse Practitioner

## 2021-09-08 DIAGNOSIS — Z01810 Encounter for preprocedural cardiovascular examination: Secondary | ICD-10-CM | POA: Diagnosis not present

## 2021-09-08 NOTE — Patient Instructions (Signed)
DUE TO COVID-19 ONLY TWO VISITORS  (aged 72 and older)  ARE ALLOWED TO COME WITH YOU AND STAY IN THE WAITING ROOM ONLY DURING PRE OP AND PROCEDURE.   **NO VISITORS ARE ALLOWED IN THE SHORT STAY AREA OR RECOVERY ROOM!!**   Your procedure is scheduled on: 09/18/21   Report to [facility] Main Entrance    Report to admitting at 7:45 AM   Call this number if you have problems the morning of surgery [phone]   Do not eat food :After Midnight.   After Midnight you may have the following liquids until 7:00 AM DAY OF SURGERY  Water Black Coffee (sugar ok, NO MILK/CREAM OR CREAMERS)  Tea (sugar ok, NO MILK/CREAM OR CREAMERS) regular and decaf                             Plain Jell-O (NO RED)                                           Fruit ices (not with fruit pulp, NO RED)                                     Popsicles (NO RED)                                                                  Juice: apple, WHITE grape, WHITE cranberry Sports drinks like Gatorade (NO RED) Clear broth(vegetable,chicken,beef)  FOLLOW BOWEL PREP AND ANY ADDITIONAL PRE OP INSTRUCTIONS YOU RECEIVED FROM YOUR SURGEON'S OFFICE!!!     Oral Hygiene is also important to reduce your risk of infection.                                    Remember - BRUSH YOUR TEETH THE MORNING OF SURGERY WITH YOUR REGULAR TOOTHPASTE   Do NOT smoke after Midnight   Take these medicines the morning of surgery with A SIP OF WATER: Tylenol, Inhalers, Zetia, IMDUR, Rosuvastatin, Tramadol.   DO NOT TAKE ANY ORAL DIABETIC MEDICATIONS DAY OF YOUR SURGERY  How to Manage Your Diabetes Before and After Surgery  Why is it important to control my blood sugar before and after surgery? Improving blood sugar levels before and after surgery helps healing and can limit problems. A way of improving blood sugar control is eating a healthy diet by:  Eating less sugar and carbohydrates  Increasing activity/exercise  Talking with your doctor  about reaching your blood sugar goals High blood sugars (greater than 180 mg/dL) can raise your risk of infections and slow your recovery, so you will need to focus on controlling your diabetes during the weeks before surgery. Make sure that the doctor who takes care of your diabetes knows about your planned surgery including the date and location.  How do I manage my blood sugar before surgery? Check your blood sugar at least 4 times a day, starting 2 days before surgery, to make  sure that the level is not too high or low. Check your blood sugar the morning of your surgery when you wake up and every 2 hours until you get to the Short Stay unit. If your blood sugar is less than 70 mg/dL, you will need to treat for low blood sugar: Do not take insulin. Treat a low blood sugar (less than 70 mg/dL) with  cup of clear juice (cranberry or apple), 4 glucose tablets, OR glucose gel. Recheck blood sugar in 15 minutes after treatment (to make sure it is greater than 70 mg/dL). If your blood sugar is not greater than 70 mg/dL on recheck, call [phone] for further instructions. Report your blood sugar to the short stay nurse when you get to Short Stay.  If you are admitted to the hospital after surgery: Your blood sugar will be checked by the staff and you will probably be given insulin after surgery (instead of oral diabetes medicines) to make sure you have good blood sugar levels. The goal for blood sugar control after surgery is 80-180 mg/dL.  Reviewed and Endorsed by [facility] Patient Education Committee, August 2015                               You may not have any metal on your body including hair pins, jewelry, and body piercing             Do not wear make-up, lotions, powders, perfumes, or deodorant  Do not wear nail polish including gel and S&S, artificial/acrylic nails, or any other type of covering on natural nails including finger and toenails. If you have artificial nails, gel  coating, etc. that needs to be removed by a nail salon please have this removed prior to surgery or surgery may need to be canceled/ delayed if the surgeon/ anesthesia feels like they are unable to be safely monitored.   Do not shave  48 hours prior to surgery.    Do not bring valuables to the hospital. [place].   Contacts, dentures or bridgework may not be worn into surgery.    Patients discharged on the day of surgery will not be allowed to drive home.  Someone NEEDS to stay with you for the first 24 hours after anesthesia.              Please read over the following fact sheets you were given: IF YOU HAVE QUESTIONS ABOUT YOUR PRE-OP INSTRUCTIONS PLEASE CALL [facility] - Preparing for Surgery Before surgery, you can play an important role.  Because skin is not sterile, your skin needs to be as free of germs as possible.  You can reduce the number of germs on your skin by washing with CHG (chlorahexidine gluconate) soap before surgery.  CHG is an antiseptic cleaner which kills germs and bonds with the skin to continue killing germs even after washing. Please DO NOT use if you have an allergy to CHG or antibacterial soaps.  If your skin becomes reddened/irritated stop using the CHG and inform your nurse when you arrive at Short Stay. Do not shave (including legs and underarms) for at least 48 hours prior to the first CHG shower.  You may shave your face/neck.  Please follow these instructions carefully:  1.  Shower with CHG Soap  the night before surgery and the  morning of surgery.  2.  If you choose to wash your hair, wash your hair first as usual with your normal  shampoo.  3.  After you shampoo, rinse your hair and body thoroughly to remove the shampoo.                             4.  Use CHG as you would any other liquid soap.  You can apply chg directly to the skin and wash.  Gently with a scrungie or clean  washcloth.  5.  Apply the CHG Soap to your body ONLY FROM THE NECK DOWN.   Do   not use on face/ open                           Wound or open sores. Avoid contact with eyes, ears mouth and   genitals (private parts).                       Wash face,  Genitals (private parts) with your normal soap.             6.  Wash thoroughly, paying special attention to the area where your    surgery  will be performed.  7.  Thoroughly rinse your body with warm water from the neck down.  8.  DO NOT shower/wash with your normal soap after using and rinsing off the CHG Soap.                9.  Pat yourself dry with a clean towel.            10.  Wear clean pajamas.            11.  Place clean sheets on your bed the night of your first shower and do not  sleep with pets. Day of Surgery : Do not apply any lotions/deodorants the morning of surgery.  Please wear clean clothes to the hospital/surgery center.  FAILURE TO FOLLOW THESE INSTRUCTIONS MAY RESULT IN THE CANCELLATION OF YOUR SURGERY  PATIENT SIGNATURE_________________________________  NURSE SIGNATURE__________________________________  ________________________________________________________________________

## 2021-09-08 NOTE — Progress Notes (Signed)
COVID Vaccine Completed: yes x3  Date of COVID positive in last 90 days:  PCP - [name], NP Cardiologist - Maylene Roes, MD  Cardiac clearance- appt 09/08/21 1300  Chest x-ray - CT  12/13/20 Epic EKG - 03/03/21 Epic Stress Test -  ECHO -  Cardiac Cath - 01/13/19 Epic Pacemaker/ICD device last checked: Spinal Cord Stimulator:  Bowel Prep -   Sleep Study -  CPAP -   Fasting Blood Sugar -  Checks Blood Sugar _____ times a day  Blood Thinner Instructions: Aspirin Instructions: [id number] Last Dose:  Activity level:  Can go up a flight of stairs and perform activities of daily living without stopping and without symptoms of chest pain or shortness of breath.   Able to exercise without symptoms  Unable to go up a flight of stairs without symptoms of      Anesthesia review: HTN, CAD, DM 2, CKD, COPD, emphysema   Patient denies shortness of breath, fever, cough and chest pain at PAT appointment   Patient verbalized understanding of instructions that were given to them at the PAT appointment. Patient was also instructed that they will need to review over the PAT instructions again at home before surgery.

## 2021-09-08 NOTE — Progress Notes (Signed)
Virtual Visit via Telephone Note   Because of [name] Cooper's co-morbid illnesses, she is at least at moderate risk for complications without adequate follow up.  This format is felt to be most appropriate for this patient at this time.  The patient did not have access to video technology/had technical difficulties with video requiring transitioning to audio format only (telephone).  All issues noted in this document were discussed and addressed.  No physical exam could be performed with this format.  Please refer to the patient's chart for her consent to telehealth for [facility].  Evaluation Performed:  Preoperative cardiovascular risk assessment _____________   Date:  09/08/2021   Patient ID:  [name] Cooper, DOB 17-Dec-1950, MRN [id number] Patient Location:  Home Provider location:   Office  Primary Care Provider:  [name], NP Primary Cardiologist:  Candee Furbish, MD  Chief Complaint / Patient Profile   71 y.o. y/o female with a h/o CAD with 80% LAD lesion treated medically in the absence of symptoms, coronary calcification/aortic atherosclerosis seen on CT, former tobaccos abuse, COPD, hypertension, hyperlipidemia, and diabetes who is pending cystoscopy, left ureteroscopy with left stent placement and presents today for telephonic preoperative cardiovascular risk assessment.  Past Medical History    Past Medical History:  Diagnosis Date   Anemia    Arthritis    Cancer ([place])    breast and ovarian   Chicken pox    Chronic lower back pain    COPD (chronic obstructive pulmonary disease) (HCC)    Diabetes (HCC)    GERD (gastroesophageal reflux disease)    Hepatitis 1970's   History of recurrent UTIs    Hyperlipemia    Hypertension    Slow heart rate dx'd 09/24/2014   Urine incontinence    Past Surgical History:  Procedure Laterality Date   ABDOMINAL HYSTERECTOMY  1984   APPENDECTOMY     BREAST BIOPSY Right 1996   BREAST LUMPECTOMY Right 1996   LEFT HEART CATH  AND CORONARY ANGIOGRAPHY N/A 01/13/2019   Procedure: LEFT HEART CATH AND CORONARY ANGIOGRAPHY;  Surgeon: Nelva Bush, MD;  Location: [place] [facility];  Service: Cardiovascular;  Laterality: N/A;   MASTECTOMY Right 1996   TONSILLECTOMY      Allergies  Allergies  Allergen Reactions   Lisinopril Anaphylaxis   Codeine     REACTION: MAKES HER SLEEPY    History of Present Illness    [name] Cooper is a 71 y.o. female who presents via audio/video conferencing for a telehealth visit today.  Pt was last seen in cardiology clinic on 03/03/21 by Dr. Marlou Porch.  At that time Amil Moseman was doing well.  The patient is now pending procedure as outlined above. Since her last visit, she denies chest pain, shortness of breath, lower extremity edema, fatigue, palpitations, melena, hematuria, hemoptysis, diaphoresis, weakness, presyncope, syncope, orthopnea, and PND.   Home Medications    Prior to Admission medications   Medication Sig Start Date End Date Taking? Authorizing Provider  acetaminophen (TYLENOL) 650 MG CR tablet Take 1,300 mg by mouth every 8 (eight) hours as needed for pain.    [provider]  albuterol (VENTOLIN HFA) 108 (90 Base) MCG/ACT inhaler Inhale 2 puffs into the lungs every 6 (six) hours as needed for wheezing or shortness of breath. 07/08/20   [name], MD  Ascorbic Acid (VITAMIN C) 500 MG CAPS Take 500 mg by mouth daily.    [provider]  aspirin EC 81 MG tablet Take 1 tablet (  81 mg total) by mouth daily. 01/11/19   Jerline Pain, MD  cholecalciferol (VITAMIN D3) 25 MCG (1000 UNIT) tablet Take 1,000 Units by mouth daily.    [provider]  ciprofloxacin (CIPRO) 500 MG tablet Take 500 mg by mouth 2 (two) times daily. 08/29/21   [provider]  CRANBERRY EXTRACT PO Take 400 mg by mouth 2 (two) times daily.    [provider]  ezetimibe (ZETIA) 10 MG tablet Take 1 tablet (10 mg total) by mouth daily. 03/03/21   Jerline Pain, MD  FeFum-FePoly-FA-B Cmp-C-Biot (INTEGRA PLUS) CAPS Take 1 capsule by mouth every morning. 07/31/19   Heilingoetter, Cassandra L, PA-C  ferrous sulfate 325 (65 FE) MG tablet Take 325 mg by mouth daily with breakfast.    [provider]  furosemide (LASIX) 20 MG tablet Take 20 mg by mouth daily as needed for fluid. 08/29/21   [provider]  isosorbide mononitrate (IMDUR) 30 MG 24 hr tablet Take 1 tablet by mouth once daily 03/24/21   Jerline Pain, MD  nitroGLYCERIN (NITROSTAT) 0.4 MG SL tablet Place 1 tablet (0.4 mg total) under the tongue every 5 (five) minutes as needed for chest pain. 07/08/20 09/05/21  [name], MD  rosuvastatin (CRESTOR) 20 MG tablet Take 1 tablet by mouth once daily 11/29/20   Marin Olp, MD  traMADol (ULTRAM) 50 MG tablet Take 50 mg by mouth 2 (two) times daily as needed for moderate pain. 05/16/21   [provider]    Physical Exam    Vital Signs:  [name] does not have vital signs available for review today.  Given telephonic nature of communication, physical exam is limited. AAOx3. NAD. Normal affect.  Speech and respirations are unlabored.  Accessory Clinical Findings    None  Assessment & Plan    1.  Preoperative Cardiovascular Risk Assessment: She is doing well from a cardiac perspective and may proceed to surgery without further cardiac testing. Per Dr. Marlou Porch, okay to hold aspirin prior to cystoscopy. (Mild increased cardiac risk in this situation. )    A copy of this note will be routed to requesting surgeon.  Time:   Today, I have spent 10 minutes with the patient with telehealth technology discussing medical history, symptoms, and management plan.     Emmaline Life, NP-C    09/08/2021, 1:13 PM [place] [address] Office [phone] Fax [phone]

## 2021-09-09 ENCOUNTER — Encounter (HOSPITAL_COMMUNITY): Payer: Self-pay

## 2021-09-09 ENCOUNTER — Encounter (HOSPITAL_COMMUNITY)
Admission: RE | Admit: 2021-09-09 | Discharge: 2021-09-09 | Disposition: A | Discharge status: Home / Self Care | Payer: Medicare Other | Source: Ambulatory Visit | Attending: Urology | Admitting: Urology

## 2021-09-09 VITALS — BP 130/83 | HR 71 | Temp 98.0°F | Resp 18 | Ht 66.0 in | Wt 270.0 lb

## 2021-09-09 DIAGNOSIS — E119 Type 2 diabetes mellitus without complications: Secondary | ICD-10-CM | POA: Insufficient documentation

## 2021-09-09 DIAGNOSIS — Z01812 Encounter for preprocedural laboratory examination: Secondary | ICD-10-CM | POA: Insufficient documentation

## 2021-09-09 DIAGNOSIS — I25119 Atherosclerotic heart disease of native coronary artery with unspecified angina pectoris: Secondary | ICD-10-CM

## 2021-09-09 LAB — CBC
HCT: 29.4 % — ABNORMAL LOW (ref 36.0–46.0)
Hemoglobin: 8.1 g/dL — ABNORMAL LOW (ref 12.0–15.0)
MCH: 19 pg — ABNORMAL LOW (ref 26.0–34.0)
MCHC: 27.6 g/dL — ABNORMAL LOW (ref 30.0–36.0)
MCV: 68.9 fL — ABNORMAL LOW (ref 80.0–100.0)
Platelets: 271 10*3/uL (ref 150–400)
RBC: 4.27 MIL/uL (ref 3.87–5.11)
RDW: 24.1 % — ABNORMAL HIGH (ref 11.5–15.5)
WBC: 8.7 10*3/uL (ref 4.0–10.5)
nRBC: 0 % (ref 0.0–0.2)

## 2021-09-09 LAB — BASIC METABOLIC PANEL WITH GFR
Anion gap: 6 (ref 5–15)
BUN: 18 mg/dL (ref 8–23)
CO2: 27 mmol/L (ref 22–32)
Calcium: 8.7 mg/dL — ABNORMAL LOW (ref 8.9–10.3)
Chloride: 107 mmol/L (ref 98–111)
Creatinine, Ser: 1.18 mg/dL — ABNORMAL HIGH (ref 0.44–1.00)
GFR, Estimated: 49 mL/min — ABNORMAL LOW
Glucose, Bld: 99 mg/dL (ref 70–99)
Potassium: 3.8 mmol/L (ref 3.5–5.1)
Sodium: 140 mmol/L (ref 135–145)

## 2021-09-09 LAB — HEMOGLOBIN A1C
Hgb A1c MFr Bld: 5.4 % (ref 4.8–5.6)
Mean Plasma Glucose: 108.28 mg/dL

## 2021-09-09 LAB — GLUCOSE, CAPILLARY: Glucose-Capillary: 106 mg/dL — ABNORMAL HIGH (ref 70–99)

## 2021-09-09 NOTE — Progress Notes (Signed)
Hgb 8.1, results routed to Dr. Louis Meckel

## 2021-09-10 LAB — URINE CULTURE: Culture: <10000 — AB

## 2021-09-10 NOTE — Progress Notes (Addendum)
UA culture +, results routed to Dr. Louis Meckel

## 2021-09-17 NOTE — Anesthesia Preprocedure Evaluation (Addendum)
Anesthesia Evaluation  Patient identified by MRN, date of birth, ID band Patient awake    Reviewed: Allergy & Precautions, NPO status , Patient's Chart, lab work & pertinent test results  History of Anesthesia Complications Negative for: history of anesthetic complications  Airway Mallampati: I  TM Distance: >3 FB Neck ROM: Full    Dental  (+) Dental Advisory Given, Edentulous Upper, Edentulous Lower   Pulmonary COPD, Current Smoker and Patient abstained from smoking.,    Pulmonary exam normal        Cardiovascular hypertension, + angina + CAD  Normal cardiovascular exam  Conclusions: 1. Significant single vessel coronary artery disease with moderately to severely calcified 80% mid LAD stenosis. 2. Mild, non-obstructive coronary artery disease involving the LCx and RCA. 3. Normal left ventricular systolic function and filling pressure. Cath 2020 Recommendations: 1. Given lack of chest pain (though dyspnea on exertion may be anginal equivalent) and no ongoing antianginal therapy, I recommend trial of isosorbide mononitrate.  If Ms. Milberger has persistent symptoms despite optimal medical therapy at follow-up, PCI to mid LAD with orbital atherectomy will need to be considered. 2. Aggressive secondary prevention.    Neuro/Psych negative neurological ROS     GI/Hepatic negative GI ROS, Neg liver ROS,   Endo/Other  diabetesMorbid obesity  Renal/GU Renal InsufficiencyRenal disease     Musculoskeletal negative musculoskeletal ROS (+)   Abdominal   Peds  Hematology  (+) Blood dyscrasia, anemia ,   Anesthesia Other Findings   Reproductive/Obstetrics                           Anesthesia Physical Anesthesia Plan  ASA: 3  Anesthesia Plan: General   Post-op Pain Management: Celebrex PO (pre-op)* and Tylenol PO (pre-op)*   Induction: Intravenous  PONV Risk Score and Plan: 3 and Ondansetron,  Dexamethasone, Treatment may vary due to age or medical condition and Diphenhydramine  Airway Management Planned: LMA  Additional Equipment:   Intra-op Plan:   Post-operative Plan: Extubation in OR  Informed Consent: I have reviewed the patients History and Physical, chart, labs and discussed the procedure including the risks, benefits and alternatives for the proposed anesthesia with the patient or authorized representative who has indicated his/her understanding and acceptance.     Dental advisory given  Plan Discussed with: Anesthesiologist and CRNA  Anesthesia Plan Comments:        Anesthesia Quick Evaluation

## 2021-09-18 ENCOUNTER — Ambulatory Visit (HOSPITAL_COMMUNITY): Payer: Medicare Other

## 2021-09-18 ENCOUNTER — Encounter (HOSPITAL_COMMUNITY): Payer: Self-pay | Admitting: Urology

## 2021-09-18 ENCOUNTER — Ambulatory Visit (HOSPITAL_COMMUNITY)
Admission: RE | Admit: 2021-09-18 | Discharge: 2021-09-18 | Disposition: A | Discharge status: Home / Self Care | Payer: Medicare Other | Attending: Urology | Admitting: Urology

## 2021-09-18 ENCOUNTER — Ambulatory Visit (HOSPITAL_COMMUNITY): Payer: Medicare Other | Admitting: Physician Assistant

## 2021-09-18 ENCOUNTER — Ambulatory Visit (HOSPITAL_BASED_OUTPATIENT_CLINIC_OR_DEPARTMENT_OTHER): Payer: Medicare Other | Admitting: Anesthesiology

## 2021-09-18 ENCOUNTER — Encounter (HOSPITAL_COMMUNITY)
Admission: RE | Disposition: A | Discharge status: Home / Self Care | Payer: Self-pay | Source: Home / Self Care | Attending: Urology

## 2021-09-18 DIAGNOSIS — N136 Pyonephrosis: Secondary | ICD-10-CM

## 2021-09-18 DIAGNOSIS — E119 Type 2 diabetes mellitus without complications: Secondary | ICD-10-CM | POA: Diagnosis not present

## 2021-09-18 DIAGNOSIS — I10 Essential (primary) hypertension: Secondary | ICD-10-CM

## 2021-09-18 DIAGNOSIS — J449 Chronic obstructive pulmonary disease, unspecified: Secondary | ICD-10-CM | POA: Diagnosis not present

## 2021-09-18 DIAGNOSIS — F17210 Nicotine dependence, cigarettes, uncomplicated: Secondary | ICD-10-CM | POA: Insufficient documentation

## 2021-09-18 DIAGNOSIS — I25119 Atherosclerotic heart disease of native coronary artery with unspecified angina pectoris: Secondary | ICD-10-CM | POA: Insufficient documentation

## 2021-09-18 HISTORY — PX: CYSTOSCOPY WITH URETEROSCOPY AND STENT PLACEMENT: SHX6377

## 2021-09-18 LAB — GLUCOSE, CAPILLARY
Glucose-Capillary: 99 mg/dL (ref 70–99)
Glucose-Capillary: 138 mg/dL — ABNORMAL HIGH (ref 70–99)

## 2021-09-18 SURGERY — CYSTOURETEROSCOPY, WITH STENT INSERTION
Anesthesia: General | Laterality: Left

## 2021-09-18 MED ORDER — AMISULPRIDE (ANTIEMETIC) 5 MG/2ML IV SOLN
INTRAVENOUS | Status: AC
  Filled 2021-09-18: qty 2

## 2021-09-18 MED ORDER — LIDOCAINE HCL (PF) 2 % IJ SOLN
INTRAMUSCULAR | Status: AC
  Filled 2021-09-18: qty 5

## 2021-09-18 MED ORDER — PHENAZOPYRIDINE HCL 200 MG PO TABS: 200.0000 mg | ORAL_TABLET | Freq: Three times a day (TID) | ORAL | 0 refills | Status: DC | PRN

## 2021-09-18 MED ORDER — PROMETHAZINE HCL 25 MG/ML IJ SOLN: 6.2500 mg | INTRAMUSCULAR | Status: DC | PRN

## 2021-09-18 MED ORDER — FENTANYL CITRATE PF 50 MCG/ML IJ SOSY
PREFILLED_SYRINGE | INTRAMUSCULAR | Status: AC
  Filled 2021-09-18: qty 1

## 2021-09-18 MED ORDER — ONDANSETRON HCL 4 MG/2ML IJ SOLN
INTRAMUSCULAR | Status: AC
  Filled 2021-09-18: qty 2

## 2021-09-18 MED ORDER — SULFAMETHOXAZOLE-TRIMETHOPRIM 800-160 MG PO TABS: 1.0000 | ORAL_TABLET | Freq: Two times a day (BID) | ORAL | 0 refills | Status: DC

## 2021-09-18 MED ORDER — LACTATED RINGERS IV SOLN: INTRAVENOUS | Status: DC

## 2021-09-18 MED ORDER — CHLORHEXIDINE GLUCONATE 0.12 % MT SOLN
15.0000 mL | Freq: Once | OROMUCOSAL | Status: AC
  Administered 2021-09-18: 15 mL via OROMUCOSAL

## 2021-09-18 MED ORDER — AMISULPRIDE (ANTIEMETIC) 5 MG/2ML IV SOLN
10.0000 mg | Freq: Once | INTRAVENOUS | Status: AC | PRN
  Administered 2021-09-18: 10 mg via INTRAVENOUS

## 2021-09-18 MED ORDER — CIPROFLOXACIN IN D5W 400 MG/200ML IV SOLN
400.0000 mg | INTRAVENOUS | Status: AC
  Administered 2021-09-18: 400 mg via INTRAVENOUS
  Filled 2021-09-18: qty 200

## 2021-09-18 MED ORDER — DEXAMETHASONE SODIUM PHOSPHATE 10 MG/ML IJ SOLN
INTRAMUSCULAR | Status: AC
  Filled 2021-09-18: qty 1

## 2021-09-18 MED ORDER — EPHEDRINE SULFATE-NACL 50-0.9 MG/10ML-% IV SOSY
PREFILLED_SYRINGE | INTRAVENOUS | Status: DC | PRN
  Administered 2021-09-18: 5 mg via INTRAVENOUS
  Administered 2021-09-18 (×2): 10 mg via INTRAVENOUS

## 2021-09-18 MED ORDER — PROPOFOL 10 MG/ML IV BOLUS
INTRAVENOUS | Status: AC
  Filled 2021-09-18: qty 20

## 2021-09-18 MED ORDER — TRAMADOL HCL 50 MG PO TABS: 50.0000 mg | ORAL_TABLET | Freq: Four times a day (QID) | ORAL | 0 refills | Status: DC | PRN

## 2021-09-18 MED ORDER — FENTANYL CITRATE PF 50 MCG/ML IJ SOSY
25.0000 ug | PREFILLED_SYRINGE | INTRAMUSCULAR | Status: DC | PRN
  Administered 2021-09-18 (×2): 50 ug via INTRAVENOUS

## 2021-09-18 MED ORDER — IOHEXOL 300 MG/ML  SOLN
INTRAMUSCULAR | Status: DC | PRN
  Administered 2021-09-18: 10 mL

## 2021-09-18 MED ORDER — OXYCODONE HCL 5 MG PO TABS
5.0000 mg | ORAL_TABLET | Freq: Once | ORAL | Status: AC | PRN
  Administered 2021-09-18: 5 mg via ORAL

## 2021-09-18 MED ORDER — ORAL CARE MOUTH RINSE: 15.0000 mL | Freq: Once | OROMUCOSAL | Status: AC

## 2021-09-18 MED ORDER — FENTANYL CITRATE (PF) 100 MCG/2ML IJ SOLN
INTRAMUSCULAR | Status: DC | PRN
  Administered 2021-09-18: 50 ug via INTRAVENOUS
  Administered 2021-09-18 (×2): 25 ug via INTRAVENOUS

## 2021-09-18 MED ORDER — DEXAMETHASONE SODIUM PHOSPHATE 10 MG/ML IJ SOLN
INTRAMUSCULAR | Status: DC | PRN
  Administered 2021-09-18: 4 mg via INTRAVENOUS

## 2021-09-18 MED ORDER — PROPOFOL 10 MG/ML IV BOLUS
INTRAVENOUS | Status: DC | PRN
  Administered 2021-09-18: 120 mg via INTRAVENOUS

## 2021-09-18 MED ORDER — LIDOCAINE 2% (20 MG/ML) 5 ML SYRINGE
INTRAMUSCULAR | Status: DC | PRN
  Administered 2021-09-18: 100 mg via INTRAVENOUS

## 2021-09-18 MED ORDER — OXYCODONE HCL 5 MG PO TABS
ORAL_TABLET | ORAL | Status: AC
  Filled 2021-09-18: qty 1

## 2021-09-18 MED ORDER — ONDANSETRON HCL 4 MG/2ML IJ SOLN
INTRAMUSCULAR | Status: DC | PRN
  Administered 2021-09-18: 4 mg via INTRAVENOUS

## 2021-09-18 MED ORDER — SODIUM CHLORIDE 0.9 % IR SOLN
Status: DC | PRN
  Administered 2021-09-18: 6000 mL

## 2021-09-18 MED ORDER — EPHEDRINE 5 MG/ML INJ
INTRAVENOUS | Status: AC
  Filled 2021-09-18: qty 5

## 2021-09-18 MED ORDER — FENTANYL CITRATE (PF) 100 MCG/2ML IJ SOLN
INTRAMUSCULAR | Status: AC
  Filled 2021-09-18: qty 2

## 2021-09-18 SURGICAL SUPPLY — 24 items
BAG URO CATCHER STRL LF (MISCELLANEOUS) ×2 IMPLANT
BASKET ZERO TIP NITINOL 2.4FR (BASKET) IMPLANT
BSKT STON RTRVL ZERO TP 2.4FR (BASKET)
CATH URETL OPEN 5X70 (CATHETERS) ×2 IMPLANT
CATH URETL OPEN END 6FR 70 (CATHETERS) ×1 IMPLANT
CATH UROLOGY TORQUE 65 (CATHETERS) ×1 IMPLANT
CLOTH BEACON ORANGE TIMEOUT ST (SAFETY) ×2 IMPLANT
EXTRACTOR STONE 1.7FRX115CM (UROLOGICAL SUPPLIES) IMPLANT
GLOVE SURG LX 7.5 STRW (GLOVE) ×1
GLOVE SURG LX STRL 7.5 STRW (GLOVE) ×1 IMPLANT
GOWN STRL REUS W/ TWL XL LVL3 (GOWN DISPOSABLE) ×1 IMPLANT
GOWN STRL REUS W/TWL XL LVL3 (GOWN DISPOSABLE) ×2
GUIDEWIRE ANG ZIPWIRE 038X150 (WIRE) IMPLANT
GUIDEWIRE STR DUAL SENSOR (WIRE) ×3 IMPLANT
IV NS IRRIG 3000ML ARTHROMATIC (IV SOLUTION) ×2 IMPLANT
MANIFOLD NEPTUNE II (INSTRUMENTS) ×2 IMPLANT
PACK CYSTO (CUSTOM PROCEDURE TRAY) ×2 IMPLANT
SHEATH NAVIGATOR HD 11/13X28 (SHEATH) IMPLANT
SHEATH NAVIGATOR HD 11/13X36 (SHEATH) IMPLANT
STENT URET 6FRX24 CONTOUR (STENTS) IMPLANT
STENT URET 6FRX26 CONTOUR (STENTS) ×1 IMPLANT
SYR CONTROL 10ML LL (SYRINGE) ×1 IMPLANT
TUBING CONNECTING 10 (TUBING) ×2 IMPLANT
TUBING UROLOGY SET (TUBING) ×2 IMPLANT

## 2021-09-18 NOTE — Discharge Instructions (Signed)
DISCHARGE INSTRUCTIONS FOR KIDNEY STONE/URETERAL STENT   MEDICATIONS:  1.  Resume all your other meds from home - except do not take any extra narcotic pain meds that you may have at home.  2. Pyridium is to help with the burning/stinging when you urinate. 3. Tramadol is for moderate/severe pain, otherwise taking upto 1000 mg every 6 hours of plainTylenol will help treat your pain.     ACTIVITY:  1. No strenuous activity x 1week  2. No driving while on narcotic pain medications  3. Drink plenty of water  4. Continue to walk at home - you can still get blood clots when you are at home, so keep active, but don't over do it.  5. May return to work/school tomorrow or when you feel ready   BATHING:  1. You can shower and we recommend daily showers  2. You have a string coming from your urethra: The stent string is attached to your ureteral stent. Do not pull on this.   SIGNS/SYMPTOMS TO CALL:  Please call us if you have a fever greater than 101.5, uncontrolled nausea/vomiting, uncontrolled pain, dizziness, unable to urinate, bloody urine, chest pain, shortness of breath, leg swelling, leg pain, redness around wound, drainage from wound, or any other concerns or questions.   You can reach [place] at [phone].   FOLLOW-UP:  1. You have an appointment for stent removal in 1 week.

## 2021-09-18 NOTE — Op Note (Signed)
Preoperative diagnosis:  Left emphysematous pyonephrosis  Postoperative diagnosis:  Same  Procedure: Cystoscopy, left retrograde pyelogram with interpretation Left diagnostic ureteroscopy Left ureteral stent placement  Surgeon: [name], MD  Anesthesia: General  Complications: None  Intraoperative findings:  #1: Retrograde pyelogram demonstrated normal caliber ureter with no filling defects or abnormalities.  In the interpolar infundibulum there was a narrowing and delayed contrast filling.  Once the contrast did begin to fluid in there there was numerous filling defects.  The upper pole was a bifid system but no abnormalities. #2: The interpolar calyx was narrowed and there was lots of Schmutz/smegma that I irrigated out copiously.  I sent a urine culture as well as a urine cytology.  The stent was placed within that calyx for better drainage.  EBL: Minimal  Specimens:  #1: Urine culture from left mid pole calyx #2: Urine cytology from left midpole calyx  Indication: [name] Cooper is a 71 y.o. 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  After reviewing the management options for treatment, he elected to proceed with the above surgical procedure(s). We have discussed the potential benefits and risks of the procedure, side effects of the proposed treatment, the likelihood of the patient achieving the goals of the procedure, and any potential problems that might occur during the procedure or recuperation. Informed consent has been obtained.  Description of procedure:  The patient was taken to the operating room and general anesthesia was induced.  The patient was placed in the dorsal lithotomy position, prepped and draped in the usual sterile fashion, and preoperative antibiotics were administered. A preoperative time-out was performed.   21 French 0 degree cystoscope was gently passed to the patient's  urethra and into the bladder under visual guidance.  Scope was turned in 3 to 6 degree fashion demonstrating no significant bladder abnormalities.  The ureteral orifice ease were orthotopic.  5 French open-ended ureteral catheter was used to cannulate the patient's left ureteral orifice and a retrograde pyelogram was performed with the above findings.  I subsequently exchanged the open-ended catheter for a sensor wire and advanced that up into the left collecting system.  I then emptied the patient's bladder, removed the cystoscope and the open-ended catheter.  I then advanced a single lumen flexible ureteroscope to the patient's urethra and was able to cannulate the patient's left ureteral orifice with a second sensor wire.  I was able to advance this up into the left collecting system.  Pyeloscopy was performed with the above findings.  I subsequently went into the calyx of concern and irrigated out the area.  The findings were as noted above.  Urine culture and cytology were sent.  I then advanced a second wire through the scope and into this calyx.  I slowly backed out the ureteroscope noting no significant ureteral trauma.  I removed the initial safety wire.  I advanced a 26 cm time 6 French double-J ureteral stent over the wire and into the appropriate calyx so that this area would be more effectively drained.  A nice curl was noted within the calyx confirmed by fluoroscopy.  No stent tether was left.  A nice curl was noted within the patient's bladder.  The patient was subsequently extubated return the PACU in stable condition.  Disposition: The patient will be scheduled for stent removal in 2 weeks  [name], M.D.

## 2021-09-18 NOTE — Anesthesia Procedure Notes (Addendum)
Procedure Name: LMA Insertion Date/Time: 09/18/2021 10:02 AM Performed by: Eben Burow, CRNA Pre-anesthesia Checklist: Patient identified, Emergency Drugs available, Suction available, Patient being monitored and Timeout performed Patient Re-evaluated:Patient Re-evaluated prior to induction Oxygen Delivery Method: Circle system utilized Preoxygenation: Pre-oxygenation with 100% oxygen Induction Type: IV induction Ventilation: Mask ventilation without difficulty LMA: LMA inserted LMA Size: 4.0 Number of attempts: 1 Tube secured with: Tape Dental Injury: Teeth and Oropharynx as per pre-operative assessment

## 2021-09-18 NOTE — Transfer of Care (Signed)
Immediate Anesthesia Transfer of Care Note  Patient: [name] Cooper  Procedure(s) Performed: CYSTOSCOPY WITH LEFT DIAGNOSTIC URETEROSCOPY AND STENT PLACEMENT (Left)  Patient Location: PACU  Anesthesia Type:General  Level of Consciousness: awake, alert  and patient cooperative  Airway & Oxygen Therapy: Patient Spontanous Breathing and Patient connected to face mask oxygen  Post-op Assessment: Report given to RN and Post -op Vital signs reviewed and stable  Post vital signs: Reviewed and stable  Last Vitals:  Vitals Value Taken Time  BP 133/77 09/18/21 1112  Temp    Pulse 64 09/18/21 1114  Resp 20 09/18/21 1114  SpO2 100 % 09/18/21 1114  Vitals shown include unvalidated device data.  Last Pain:  Vitals:   09/18/21 0825  TempSrc: Oral  PainSc:          Complications: No notable events documented.

## 2021-09-18 NOTE — Anesthesia Postprocedure Evaluation (Signed)
Anesthesia Post Note  Patient: [name] Cooper  Procedure(s) Performed: CYSTOSCOPY WITH LEFT DIAGNOSTIC URETEROSCOPY AND STENT PLACEMENT (Left)     Patient location during evaluation: PACU Anesthesia Type: General Level of consciousness: sedated Pain management: pain level controlled Vital Signs Assessment: post-procedure vital signs reviewed and stable Respiratory status: spontaneous breathing and respiratory function stable Cardiovascular status: stable Postop Assessment: no apparent nausea or vomiting Anesthetic complications: no   No notable events documented.  Last Vitals:  Vitals:   09/18/21 1145 09/18/21 1200  BP: (!) 158/86 (!) 144/82  Pulse: (!) 59 (!) 58  Resp: 19 13  Temp:    SpO2: 90% 93%    Last Pain:  Vitals:   09/18/21 1145  TempSrc:   PainSc: 5                  Virgilia Quigg DANIEL

## 2021-09-18 NOTE — OR Nursing (Signed)
Left ureteral stent placed by Dr Louis Meckel. 6 x 26 contour stent. Lot # [id number], exp 05/26/24.

## 2021-09-18 NOTE — H&P (Signed)
71 year old female presents today for discussion of recurrent urinary tract infections and air seen in her left kidney. This was most recently seen on a CT scan in January. It was first noted in August 2021. Since that time it has improved slightly, but remains. The patient was noted to have an E. coli urinary tract infection one of her most recent follow-up visits with her primary care doctor. She was treated for that, and recently completed it. She denies any flank pain. She denies any gross hematuria. She denies any fevers or chills.   The patient is morbidly obese. She has COPD. The patient has poor exercise tolerance. She was seen by cardiology last fall and they told her that she could follow annually.   Interval: Today patient follows up after completing a CT scan with IV contrast. This demonstrated persistent air in the left kidney. It does seem to be somewhat improved from 6 months ago. There is some debris within the calyces that contains the air. The patient denies any flank pain or fevers. She is being treated for third urinary tract infection in the last month or so. She is otherwise doing well.       ALLERGIES: CODEINE    MEDICATIONS: Aspirin  Metformin Hcl 500 mg tablet  Hydralazine Hcl 25 mg tablet  Isosorbide  Multiple Vitamin  Nitroglycerin  Rosuvastatin Calcium 20 mg tablet  Tradjenta 5 mg tablet  Triamterene-Hydrochlorothiazid     GU PSH: Hysterectomy Locm 300-'399Mg'$ /Ml Iodine,1Ml - 07/28/2021     NON-GU PSH: Bmi>=30Or<22 Cal No Followup - 2019 Breast mastectomy, Right Doc Meds Verified W/Pt Or Re - 2019 Pain Neg No Plan - 2019     GU PMH: Pyonephrosis - 07/28/2021, - 07/21/2021 Stress Incontinence - 07/21/2021, - 04/04/2020, - 01/11/2020, - 2019 Acute Cystitis/UTI - 04/04/2020, - 01/11/2020    NON-GU PMH: Muscle weakness (generalized) - 2019, - 2019 Other muscle spasm - 2019, - 2019 Arthritis Asthma Breast Cancer, History Diabetes Type  2 GERD Hypercholesterolemia Hypertension    FAMILY HISTORY: 1 Daughter - No Family History Breast Cancer - Mother   SOCIAL HISTORY: Marital Status: Widowed Preferred Language: English; Race: Black or African American Current Smoking Status: Patient smokes. Smokes 1/2 pack per day.   Tobacco Use Assessment Completed: Used Tobacco in last 30 days? Has never drank.  Does not drink caffeine. Has not had a blood transfusion.    REVIEW OF SYSTEMS:    GU Review Female:   Patient denies frequent urination, hard to postpone urination, burning /pain with urination, get up at night to urinate, leakage of urine, stream starts and stops, trouble starting your stream, have to strain to urinate, and being pregnant.  Gastrointestinal (Upper):   Patient denies nausea, vomiting, and indigestion/ heartburn.  Gastrointestinal (Lower):   Patient denies diarrhea and constipation.  Constitutional:   Patient denies fever, night sweats, weight loss, and fatigue.  Skin:   Patient denies skin rash/ lesion and itching.  Eyes:   Patient denies blurred vision and double vision.  Ears/ Nose/ Throat:   Patient denies sore throat and sinus problems.  Hematologic/Lymphatic:   Patient denies swollen glands and easy bruising.  Cardiovascular:   Patient denies leg swelling and chest pains.  Respiratory:   Patient denies cough and shortness of breath.  Endocrine:   Patient denies excessive thirst.  Musculoskeletal:   Patient denies back pain and joint pain.  Neurological:   Patient denies headaches and dizziness.  Psychologic:   Patient denies depression and anxiety.  VITAL SIGNS: None   Complexity of Data:  Source Of History:  Patient  Lab Test Review:   PSA  Records Review:   Previous Doctor Records, Previous Patient Records, POC Tool  Urine Test Review:   Urinalysis   PROCEDURES:          Urinalysis w/Scope Dipstick Dipstick Cont'd Micro  Color: Yellow Bilirubin: Neg mg/dL WBC/hpf: 0 - 5/hpf   Appearance: Clear Ketones: Neg mg/dL RBC/hpf: 0 - 2/hpf  Specific Gravity: 1.025 Blood: Neg ery/uL Bacteria: Few (10-25/hpf)  pH: 6.5 Protein: Trace mg/dL Cystals: NS (Not Seen)  Glucose: Neg mg/dL Urobilinogen: 0.2 mg/dL Casts: NS (Not Seen)    Nitrites: Neg Trichomonas: Not Present    Leukocyte Esterase: 2+ leu/uL Mucous: Not Present      Epithelial Cells: 0 - 5/hpf      Yeast: NS (Not Seen)      Sperm: Not Present    ASSESSMENT:      ICD-10 Details  1 GU:   Pyonephrosis - N13.6      PLAN:           Orders Labs Urine Culture          Schedule Return Visit/Planned Activity: ASAP - Schedule Surgery          Document Letter(s):  Created for Patient: Clinical Summary         Notes:   I went through the CT scan with the patient and her daughter. We discussed management strategies. We discussed surveillance and repeat CT scan. We discussed placing a drain with IR. We also discussed ureteroscopy and stent placement. Given that she has had recurrent urinary tract infections over the last several months, I think it is prudent to take a look at this or intervene in some way to see if we can minimize that. I also worry that if left untreated that the infection in her kidney may turn into xanthogranulomatous pyelonephritis. Further, this may also be a transitional cell malignancy. Given that, I recommended that we proceed with ureteroscopy. The patient has agreed to proceed after I was able to go through the procedure with her and her daughter and answered all her questions. Patient will need clearance from her cardiologist. We will work on that and get her scheduled ASAP.

## 2021-09-18 NOTE — Interval H&P Note (Signed)
History and Physical Interval Note:  09/18/2021 9:23 AM  [name] Cooper  has presented today for surgery, with the diagnosis of LEFT  UPPER URINARY TRACT LESION.  The various methods of treatment have been discussed with the patient and family. After consideration of risks, benefits and other options for treatment, the patient has consented to  Procedure(s) with comments: CYSTOSCOPY WITH LEFT DIAGNOSTIC URETEROSCOPY POSSIBLE LASER ENDOPYELOTOMY LEFT AND STENT PLACEMENT (Left) - 65 MINS as a surgical intervention.  The patient's history has been reviewed, patient examined, no change in status, stable for surgery.  I have reviewed the patient's chart and labs.  Questions were answered to the patient's satisfaction.     [name]

## 2021-09-19 ENCOUNTER — Encounter (HOSPITAL_COMMUNITY): Payer: Self-pay | Admitting: Urology

## 2021-09-19 LAB — CYTOLOGY - NON PAP

## 2021-09-20 LAB — URINE CULTURE: Culture: 60000 — AB

## 2021-09-22 ENCOUNTER — Telehealth: Payer: Self-pay | Admitting: Internal Medicine

## 2021-09-22 NOTE — Telephone Encounter (Signed)
Called patient regarding upcoming appointment, patient is notified. °

## 2021-10-08 ENCOUNTER — Inpatient Hospital Stay (HOSPITAL_BASED_OUTPATIENT_CLINIC_OR_DEPARTMENT_OTHER): Payer: Medicare Other | Admitting: Internal Medicine

## 2021-10-08 ENCOUNTER — Telehealth: Payer: Self-pay | Admitting: Medical Oncology

## 2021-10-08 ENCOUNTER — Other Ambulatory Visit: Payer: Self-pay

## 2021-10-08 ENCOUNTER — Inpatient Hospital Stay: Payer: Medicare Other | Attending: Internal Medicine

## 2021-10-08 ENCOUNTER — Other Ambulatory Visit: Payer: Self-pay | Admitting: Medical Oncology

## 2021-10-08 ENCOUNTER — Inpatient Hospital Stay: Payer: Medicare Other

## 2021-10-08 VITALS — BP 110/69 | HR 83 | Temp 97.7°F | Resp 16 | Wt 262.1 lb

## 2021-10-08 DIAGNOSIS — D500 Iron deficiency anemia secondary to blood loss (chronic): Secondary | ICD-10-CM

## 2021-10-08 DIAGNOSIS — D509 Iron deficiency anemia, unspecified: Secondary | ICD-10-CM

## 2021-10-08 LAB — CBC WITH DIFFERENTIAL (CANCER CENTER ONLY)
Abs Immature Granulocytes: 0.02 10*3/uL (ref 0.00–0.07)
Basophils Absolute: 0.1 10*3/uL (ref 0.0–0.1)
Basophils Relative: 1 %
Eosinophils Absolute: 0.6 10*3/uL — ABNORMAL HIGH (ref 0.0–0.5)
Eosinophils Relative: 7 %
HCT: 24.9 % — ABNORMAL LOW (ref 36.0–46.0)
Hemoglobin: 7 g/dL — ABNORMAL LOW (ref 12.0–15.0)
Immature Granulocytes: 0 %
Lymphocytes Relative: 31 %
Lymphs Abs: 2.9 10*3/uL (ref 0.7–4.0)
MCH: 17.2 pg — ABNORMAL LOW (ref 26.0–34.0)
MCHC: 28.1 g/dL — ABNORMAL LOW (ref 30.0–36.0)
MCV: 61.3 fL — ABNORMAL LOW (ref 80.0–100.0)
Monocytes Absolute: 0.8 10*3/uL (ref 0.1–1.0)
Monocytes Relative: 9 %
Neutro Abs: 4.8 10*3/uL (ref 1.7–7.7)
Neutrophils Relative %: 52 %
Platelet Count: 476 10*3/uL — ABNORMAL HIGH (ref 150–400)
RBC: 4.06 MIL/uL (ref 3.87–5.11)
RDW: 23.6 % — ABNORMAL HIGH (ref 11.5–15.5)
WBC Count: 9.2 10*3/uL (ref 4.0–10.5)
nRBC: 0.4 % — ABNORMAL HIGH (ref 0.0–0.2)

## 2021-10-08 LAB — IRON AND IRON BINDING CAPACITY (CC-WL,HP ONLY)
Iron: 75 ug/dL (ref 28–170)
Saturation Ratios: 23 % (ref 10.4–31.8)
TIBC: 333 ug/dL (ref 250–450)
UIBC: 258 ug/dL (ref 148–442)

## 2021-10-08 LAB — FERRITIN: Ferritin: 11 ng/mL (ref 11–307)

## 2021-10-08 NOTE — Progress Notes (Signed)
[facility] [phone]   [phone]  OFFICE PROGRESS NOTE  [name], NP [address]  [place] [address]  DIAGNOSIS: Iron deficiency anemia Bone marrow biopsy and aspirate in April 2023 was nonspecific but could not exclude early MDS.   PRIOR THERAPY: None   CURRENT THERAPY:  1) IV iron PRN with venofer 300 mg weekly for 3 weeks at a time.   2) Oral iron supplement with integra plus p.o. daily.    INTERVAL HISTORY: [name] Cooper 71 y.o. female returns to the clinic today for follow-up visit accompanied by her husband.  The patient continues to have mild fatigue but no significant shortness of breath or dizzy spells.  She has no chest pain, cough or hemoptysis.  She has no nausea, vomiting, diarrhea or constipation.  She has no headache or visual changes.  She denied having any fever or chills.  She received several iron infusion in the past with no significant improvement in her condition.  She denied having any bleeding, bruises or ecchymosis.  She continues to have black stool but this secondary to the oral iron tablet.  She is here today for evaluation and repeat blood work.   MEDICAL HISTORY: Past Medical History:  Diagnosis Date   Anemia    Arthritis    Cancer ([facility])    breast and ovarian   Chicken pox    Chronic lower back pain    COPD (chronic obstructive pulmonary disease) (HCC)    Diabetes (HCC)    GERD (gastroesophageal reflux disease)    Hepatitis 1970's   History of recurrent UTIs    Hyperlipemia    Hypertension    Slow heart rate dx'd 09/24/2014   Urine incontinence     ALLERGIES:  is allergic to lisinopril and codeine.  MEDICATIONS:  Current Outpatient Medications  Medication Sig Dispense Refill   acetaminophen (TYLENOL) 650 MG CR tablet Take 1,300 mg by mouth every 8 (eight) hours as needed for pain.     albuterol (VENTOLIN HFA) 108 (90 Base) MCG/ACT inhaler Inhale 2 puffs into the lungs every 6 (six) hours  as needed for wheezing or shortness of breath. 1 each 3   Ascorbic Acid (VITAMIN C) 500 MG CAPS Take 500 mg by mouth daily.     aspirin EC 81 MG tablet Take 1 tablet (81 mg total) by mouth daily. 90 tablet 3   cholecalciferol (VITAMIN D3) 25 MCG (1000 UNIT) tablet Take 1,000 Units by mouth daily.     CRANBERRY EXTRACT PO Take 400 mg by mouth 2 (two) times daily.     ezetimibe (ZETIA) 10 MG tablet Take 1 tablet (10 mg total) by mouth daily. 90 tablet 3   FeFum-FePoly-FA-B Cmp-C-Biot (INTEGRA PLUS) CAPS Take 1 capsule by mouth every morning. 30 capsule 2   ferrous sulfate 325 (65 FE) MG tablet Take 325 mg by mouth daily with breakfast.     furosemide (LASIX) 20 MG tablet Take 20 mg by mouth daily as needed for fluid.     isosorbide mononitrate (IMDUR) 30 MG 24 hr tablet Take 1 tablet by mouth once daily 90 tablet 3   nitroGLYCERIN (NITROSTAT) 0.4 MG SL tablet Place 1 tablet (0.4 mg total) under the tongue every 5 (five) minutes as needed for chest pain. 25 tablet prn   phenazopyridine (PYRIDIUM) 200 MG tablet Take 1 tablet (200 mg total) by mouth 3 (three) times daily as needed for pain. 10 tablet 0  rosuvastatin (CRESTOR) 20 MG tablet Take 1 tablet by mouth once daily 21 tablet 1   sulfamethoxazole-trimethoprim (BACTRIM DS) 800-160 MG tablet Take 1 tablet by mouth 2 (two) times daily. 14 tablet 0   traMADol (ULTRAM) 50 MG tablet Take 1-2 tablets (50-100 mg total) by mouth every 6 (six) hours as needed for moderate pain. 15 tablet 0   No current facility-administered medications for this visit.    SURGICAL HISTORY:  Past Surgical History:  Procedure Laterality Date   ABDOMINAL HYSTERECTOMY  1984   APPENDECTOMY     BREAST BIOPSY Right 1996   BREAST LUMPECTOMY Right 1996   CYSTOSCOPY WITH URETEROSCOPY AND STENT PLACEMENT Left 09/18/2021   Procedure: CYSTOSCOPY WITH LEFT DIAGNOSTIC URETEROSCOPY, RETROGRADE PYELOGRAM, AND STENT PLACEMENT;  Surgeon: [name], MD;  Location: [facility];   Service: Urology;  Laterality: Left;  63 MINS   LEFT HEART CATH AND CORONARY ANGIOGRAPHY N/A 01/13/2019   Procedure: LEFT HEART CATH AND CORONARY ANGIOGRAPHY;  Surgeon: Nelva Bush, MD;  Location: [facility];  Service: Cardiovascular;  Laterality: N/A;   MASTECTOMY Right 1996   TONSILLECTOMY      REVIEW OF SYSTEMS:  A comprehensive review of systems was negative except for: Constitutional: positive for fatigue   PHYSICAL EXAMINATION: General appearance: alert, cooperative, fatigued, and no distress Head: Normocephalic, without obvious abnormality, atraumatic Neck: no adenopathy, no JVD, supple, symmetrical, trachea midline, and thyroid not enlarged, symmetric, no tenderness/mass/nodules Lymph nodes: Cervical, supraclavicular, and axillary nodes normal. Resp: clear to auscultation bilaterally Back: symmetric, no curvature. ROM normal. No CVA tenderness. Cardio: regular rate and rhythm, S1, S2 normal, no murmur, click, rub or gallop GI: soft, non-tender; bowel sounds normal; no masses,  no organomegaly Extremities: extremities normal, atraumatic, no cyanosis or edema  ECOG PERFORMANCE STATUS: 1 - Symptomatic but completely ambulatory  Blood pressure 110/69, pulse 83, temperature 97.7 F (36.5 C), temperature source Tympanic, resp. rate 16, weight 262 lb 1 oz (118.9 kg), SpO2 98 %.  LABORATORY DATA: Lab Results  Component Value Date   WBC 9.2 10/08/2021   HGB 7.0 (L) 10/08/2021   HCT 24.9 (L) 10/08/2021   MCV 61.3 (L) 10/08/2021   PLT 476 (H) 10/08/2021      Chemistry      Component Value Date/Time   NA 140 09/09/2021 1113   NA 140 01/30/2019 1330   K 3.8 09/09/2021 1113   CL 107 09/09/2021 1113   CO2 27 09/09/2021 1113   BUN 18 09/09/2021 1113   BUN 18 01/30/2019 1330   CREATININE 1.18 (H) 09/09/2021 1113   CREATININE 1.20 (H) 12/21/2019 1336   CREATININE 1.28 (H) 09/21/2016 1115      Component Value Date/Time   CALCIUM 8.7 (L) 09/09/2021 1113   ALKPHOS 88  07/08/2020 1422   AST 13 07/08/2020 1422   AST 12 (L) 12/21/2019 1336   ALT 10 06/05/2021 0748   ALT 8 12/21/2019 1336   BILITOT 0.4 07/08/2020 1422   BILITOT 0.4 12/21/2019 1336       RADIOGRAPHIC STUDIES: DG C-Arm 1-60 Min-No Report  Result Date: 09/18/2021 Fluoroscopy was utilized by the requesting physician.  No radiographic interpretation.    ASSESSMENT AND PLAN: This is a very pleasant 71 years old African-American female with iron deficiency anemia status post Venofer infusion in October 2021.   The patient is status post iron infusion with Venofer completed last year and tolerated it fairly well.  She has been on oral iron tablet with Integra +  1 capsule p.o. daily. The patient was treated with iron infusion with Venofer several times recently even at a higher dose of 400 mg but no significant improvement in her anemia.   She had a recent bone marrow biopsy and aspirate that showed no specific findings unlikely to be reactive in nature but early MDS could not be excluded. The patient had extensive studies in the past that was conclusive for any underlying abnormality of her anemia except for the iron deficiency.  Her serum iron and ceruloplasmin were normal. Repeat CBC today showed persistent severe anemia with hemoglobin down to 7.0. I will arrange for the patient to receive 2 units of PRBCs transfusion today. I will wait for the iron study to see if she will need any additional iron infusion. I will also test her stool for Hemoccult to rule out any gastrointestinal bleeding. I will see the patient back for follow-up visit in 2 months for evaluation and repeat blood work. She was advised to call immediately if she has any other concerning symptoms in the interval. The patient voices understanding of current disease status and treatment options and is in agreement with the current care plan.  All questions were answered. The patient knows to call the clinic with any problems,  questions or concerns. We can certainly see the patient much sooner if necessary.  Disclaimer: This note was dictated with voice recognition software. Similar sounding words can inadvertently be transcribed and may not be corrected upon review.

## 2021-10-08 NOTE — Telephone Encounter (Signed)
Appts for blood transfusion-Called pt to come back today for Blood bank lab test and to receive iron tomorrow.   Dtr called me and said [name] Cooper told her "I am tired of being poked". Dtr said to cancel the lab and blood transfusion appts because [name] Cooper does not want to get  a blood transfusion. She asked what would happen if she did not get blood transfusion . I explained to her the risks of low HGB and symptoms of anemia including : bleeding , low iron  , short of breath, tachycardia, light headed, syncope , heart failure ,heart attack and death. She voiced understanding.

## 2021-10-09 ENCOUNTER — Ambulatory Visit: Payer: Medicare Other

## 2021-10-17 ENCOUNTER — Other Ambulatory Visit: Payer: Self-pay | Admitting: Registered Nurse

## 2021-10-17 ENCOUNTER — Telehealth: Payer: Self-pay | Admitting: Internal Medicine

## 2021-10-17 DIAGNOSIS — Z1231 Encounter for screening mammogram for malignant neoplasm of breast: Secondary | ICD-10-CM

## 2021-10-17 NOTE — Telephone Encounter (Signed)
Called patient regarding upcoming August appointments, patient is notified. 

## 2021-11-04 ENCOUNTER — Telehealth: Payer: Self-pay

## 2021-11-04 NOTE — Telephone Encounter (Signed)
Received call from Candy Sledge from [facility] stating that patient's HGB on 11/03/21 was 6.9 g/dl. Call pt and explained urgent need for evaluation of low HGB. Patient states "My blood has been low for a long time and nothing works. I don't want to go in." Explained that low HGB risks can include SOB, tachycardia, light headedness, syncope, heart failure, heart attack and death. She verbalized understanding and declined evaluation at [facility].  Routed to providers.

## 2021-11-12 ENCOUNTER — Ambulatory Visit (INDEPENDENT_AMBULATORY_CARE_PROVIDER_SITE_OTHER): Payer: Medicare Other | Admitting: Podiatry

## 2021-11-12 ENCOUNTER — Encounter: Payer: Self-pay | Admitting: Podiatry

## 2021-11-12 DIAGNOSIS — M79675 Pain in left toe(s): Secondary | ICD-10-CM | POA: Diagnosis not present

## 2021-11-12 DIAGNOSIS — B351 Tinea unguium: Secondary | ICD-10-CM

## 2021-11-12 DIAGNOSIS — E119 Type 2 diabetes mellitus without complications: Secondary | ICD-10-CM | POA: Diagnosis not present

## 2021-11-12 DIAGNOSIS — M79674 Pain in right toe(s): Secondary | ICD-10-CM

## 2021-11-17 NOTE — Progress Notes (Signed)
  Subjective:  Patient ID: [name] Cooper, female    DOB: [birth year],  MRN: [id number]  [name] Cooper presents to clinic today for preventative diabetic foot care and painful, discolored, thick toenails which interfere with daily activities  New problem(s): None.   PCP is [name] Holms, NP , and last visit was  November 03, 2021  Allergies  Allergen Reactions   Lisinopril Anaphylaxis   Codeine     REACTION: MAKES HER SLEEPY    Review of Systems: Negative except as noted in the HPI.  Objective: No changes noted in today's physical examination. [name] Cooper is a pleasant 71 y.o. female in NAD. AAO X 3.  Vascular Examination: CFT immediate b/l LE. Palpable DP/PT pulses b/l LE. Digital hair sparse b/l. Skin temperature gradient WNL b/l. No pain with calf compression b/l. No edema noted b/l. No cyanosis or clubbing noted b/l LE.  Dermatological Examination: Pedal integument with normal turgor, texture and tone b/l LE. No open wounds b/l. No interdigital macerations b/l. Toenails 2-5 b/l  elongated, thickened, discolored with subungual debris. +Tenderness with dorsal palpation of nailplates. No hyperkeratotic or porokeratotic lesions present. Incurvated nailplate right great toe medial  border(s) with tenderness to palpation. No erythema, no edema, no drainage noted. Left great toe with evidence of total nail avulsion with regrowth of mycotic nail plate.  Musculoskeletal Examination: Normal muscle strength 5/5 to all lower extremity muscle groups bilaterally. No pain, crepitus or joint limitation noted with ROM b/l LE. No gross bony pedal deformities b/l. Patient ambulates independently without assistive aids.  Neurological Examination: Protective sensation intact 5/5 intact bilaterally with 10g monofilament b/l. Vibratory sensation intact b/l.  Assessment/Plan:    Latest Ref Rng & Units 09/09/2021   11:13 AM  Hemoglobin A1C  Hemoglobin-A1c 4.8 - 5.6 % 5.4    Assessment/Plan: 1. Pain  due to onychomycosis of toenails of both feet   2. Diabetes mellitus without complication ([name] Cooper)     -Examined patient. -No new findings. No new orders. -Mycotic toenails 1-5 bilaterally were debrided in length and girth with sterile nail nippers and dremel without iatrogenic bleeding. -Patient/POA to call should there be question/concern in the interim.   Return in about 3 months (around 02/12/2022).  [name] Cooper, DPM

## 2021-11-28 ENCOUNTER — Ambulatory Visit
Admission: RE | Admit: 2021-11-28 | Discharge: 2021-11-28 | Disposition: A | Discharge status: Home / Self Care | Payer: Medicare Other | Source: Ambulatory Visit | Attending: Registered Nurse | Admitting: Registered Nurse

## 2021-11-28 DIAGNOSIS — Z1231 Encounter for screening mammogram for malignant neoplasm of breast: Secondary | ICD-10-CM

## 2021-12-08 ENCOUNTER — Observation Stay (HOSPITAL_COMMUNITY)
Admission: EM | Admit: 2021-12-08 | Discharge: 2021-12-10 | Disposition: A | Discharge status: Home / Self Care | Payer: Medicare Other | Attending: Internal Medicine | Admitting: Internal Medicine

## 2021-12-08 ENCOUNTER — Emergency Department (HOSPITAL_BASED_OUTPATIENT_CLINIC_OR_DEPARTMENT_OTHER)
Admission: EM | Admit: 2021-12-08 | Discharge: 2021-12-08 | Discharge status: Against Medical Advice | Payer: Medicare Other | Source: Home / Self Care | Attending: Emergency Medicine | Admitting: Emergency Medicine

## 2021-12-08 ENCOUNTER — Other Ambulatory Visit: Payer: Self-pay

## 2021-12-08 ENCOUNTER — Encounter (HOSPITAL_COMMUNITY): Payer: Self-pay | Admitting: Emergency Medicine

## 2021-12-08 DIAGNOSIS — N183 Chronic kidney disease, stage 3 unspecified: Secondary | ICD-10-CM | POA: Diagnosis present

## 2021-12-08 DIAGNOSIS — Z7982 Long term (current) use of aspirin: Secondary | ICD-10-CM | POA: Insufficient documentation

## 2021-12-08 DIAGNOSIS — E1122 Type 2 diabetes mellitus with diabetic chronic kidney disease: Secondary | ICD-10-CM | POA: Insufficient documentation

## 2021-12-08 DIAGNOSIS — I129 Hypertensive chronic kidney disease with stage 1 through stage 4 chronic kidney disease, or unspecified chronic kidney disease: Secondary | ICD-10-CM | POA: Insufficient documentation

## 2021-12-08 DIAGNOSIS — D649 Anemia, unspecified: Secondary | ICD-10-CM | POA: Diagnosis present

## 2021-12-08 DIAGNOSIS — J439 Emphysema, unspecified: Secondary | ICD-10-CM | POA: Diagnosis present

## 2021-12-08 DIAGNOSIS — F17200 Nicotine dependence, unspecified, uncomplicated: Secondary | ICD-10-CM | POA: Diagnosis present

## 2021-12-08 DIAGNOSIS — N189 Chronic kidney disease, unspecified: Secondary | ICD-10-CM | POA: Insufficient documentation

## 2021-12-08 DIAGNOSIS — Z79899 Other long term (current) drug therapy: Secondary | ICD-10-CM | POA: Insufficient documentation

## 2021-12-08 DIAGNOSIS — F17210 Nicotine dependence, cigarettes, uncomplicated: Secondary | ICD-10-CM | POA: Insufficient documentation

## 2021-12-08 DIAGNOSIS — D509 Iron deficiency anemia, unspecified: Secondary | ICD-10-CM | POA: Diagnosis not present

## 2021-12-08 DIAGNOSIS — Z853 Personal history of malignant neoplasm of breast: Secondary | ICD-10-CM | POA: Insufficient documentation

## 2021-12-08 DIAGNOSIS — N1831 Chronic kidney disease, stage 3a: Secondary | ICD-10-CM | POA: Diagnosis not present

## 2021-12-08 DIAGNOSIS — R5383 Other fatigue: Secondary | ICD-10-CM | POA: Diagnosis present

## 2021-12-08 DIAGNOSIS — D500 Iron deficiency anemia secondary to blood loss (chronic): Secondary | ICD-10-CM

## 2021-12-08 DIAGNOSIS — E7800 Pure hypercholesterolemia, unspecified: Secondary | ICD-10-CM | POA: Insufficient documentation

## 2021-12-08 DIAGNOSIS — D631 Anemia in chronic kidney disease: Secondary | ICD-10-CM | POA: Diagnosis not present

## 2021-12-08 DIAGNOSIS — Z8543 Personal history of malignant neoplasm of ovary: Secondary | ICD-10-CM | POA: Insufficient documentation

## 2021-12-08 DIAGNOSIS — J449 Chronic obstructive pulmonary disease, unspecified: Secondary | ICD-10-CM | POA: Diagnosis not present

## 2021-12-08 DIAGNOSIS — Z6841 Body Mass Index (BMI) 40.0 and over, adult: Secondary | ICD-10-CM | POA: Insufficient documentation

## 2021-12-08 DIAGNOSIS — E119 Type 2 diabetes mellitus without complications: Secondary | ICD-10-CM

## 2021-12-08 DIAGNOSIS — I10 Essential (primary) hypertension: Secondary | ICD-10-CM | POA: Diagnosis present

## 2021-12-08 LAB — RETICULOCYTES
Immature Retic Fract: 37.3 % — ABNORMAL HIGH (ref 2.3–15.9)
RBC.: 4.08 MIL/uL (ref 3.87–5.11)
Retic Count, Absolute: 91.4 10*3/uL (ref 19.0–186.0)
Retic Ct Pct: 2.2 % (ref 0.4–3.1)

## 2021-12-08 LAB — CBC
HCT: 21.9 % — ABNORMAL LOW (ref 36.0–46.0)
Hemoglobin: 5.7 g/dL — CL (ref 12.0–15.0)
MCH: 14.8 pg — ABNORMAL LOW (ref 26.0–34.0)
MCHC: 26 g/dL — ABNORMAL LOW (ref 30.0–36.0)
MCV: 56.9 fL — ABNORMAL LOW (ref 80.0–100.0)
Platelets: 299 10*3/uL (ref 150–400)
RBC: 3.85 MIL/uL — ABNORMAL LOW (ref 3.87–5.11)
RDW: 24.7 % — ABNORMAL HIGH (ref 11.5–15.5)
WBC: 8.9 10*3/uL (ref 4.0–10.5)
nRBC: 1.5 % — ABNORMAL HIGH (ref 0.0–0.2)

## 2021-12-08 LAB — BASIC METABOLIC PANEL
Anion gap: 11 (ref 5–15)
BUN: 14 mg/dL (ref 8–23)
CO2: 24 mmol/L (ref 22–32)
Calcium: 9 mg/dL (ref 8.9–10.3)
Chloride: 107 mmol/L (ref 98–111)
Creatinine, Ser: 1.17 mg/dL — ABNORMAL HIGH (ref 0.44–1.00)
GFR, Estimated: 50 mL/min — ABNORMAL LOW (ref >60)
Glucose, Bld: 114 mg/dL — ABNORMAL HIGH (ref 70–99)
Potassium: 3.8 mmol/L (ref 3.5–5.1)
Sodium: 142 mmol/L (ref 135–145)

## 2021-12-08 MED ORDER — SODIUM CHLORIDE 0.9 % IV SOLN: 10.0000 mL/h | Freq: Once | INTRAVENOUS | Status: DC

## 2021-12-08 NOTE — ED Notes (Signed)
Patient left AMA.  States understood risk of doing so.   States will follow up with her doctor

## 2021-12-08 NOTE — ED Notes (Signed)
Blood consent obtained

## 2021-12-08 NOTE — ED Provider Triage Note (Signed)
Emergency Medicine Provider Triage Evaluation Note  [name] Cooper , a 71 y.o. female  was evaluated in triage.  Pt complains of hemoglobin 5.7 at [facility] with symptomatic weakness, tiredness, she was transferred ED to ED, she will need blood transfusion.  Charge nurse aware that patient needs room for admission.  Review of Systems  Positive: Symptomatic anemia Negative:   Physical Exam  BP 120/73 (BP Location: Left Arm)   Pulse 71   Temp 98.4 F (36.9 C) (Oral)   Resp 16   LMP  (LMP Unknown)   SpO2 91%  Gen:   Awake, no distress   Resp:  Normal effort  MSK:   Moves extremities without difficulty  Other:    Medical Decision Making  Medically screening exam initiated at 8:42 PM.  Appropriate orders placed.  [name] Cooper was informed that the remainder of the evaluation will be completed by another provider, this initial triage assessment does not replace that evaluation, and the importance of remaining in the ED until their evaluation is complete.  Charge nurse aware patient needs room for admission and transfusion   [name] Cooper 12/08/21 2045

## 2021-12-08 NOTE — ED Triage Notes (Signed)
Patient arrives for abnormal lab. Patient states she has been dealing with fatigue for months. She was seen at her PCP today for the fatigue and her labs showed her hemoglobin level at 5.9.   Patient does voice chronic back pain. Rates pain an 8/10.

## 2021-12-08 NOTE — ED Triage Notes (Signed)
Patient seen at [facility] today HGB= 5.7 , will need blood transfusion .

## 2021-12-08 NOTE — ED Provider Notes (Signed)
[facility] EMERGENCY DEPT Provider Note   CSN: [id number] Arrival date & time: 12/08/21  1547     History  Chief Complaint  Patient presents with   Abnormal Lab    Hgb 5.9    [name] Cooper is a 71 y.o. female history of anemia, diabetes, CKD, hypertension here for evaluation of abnormal lab.  Patient states she has been dealing with fatigue for months.  She has been following with Dr. Julien Nordmann with heme-onc for this.  Has noted increased worsening anemia.  Seems that she has had a very thorough work-up to attempt to find the cause of her worsening anemia.  Seems like they have tried to get her in outpatient over the last few weeks for blood transfusion and iron transfusion however patient did not follow-up.  She states she was seen by her PCP today who noted a worsening anemia with a hemoglobin of 5.9.  She presents here for evaluation.  She denies any headache, nausea, vomiting, abdominal pain, melena, blood per rectum.  Denies any recent injuries or trauma.  HPI     Home Medications Prior to Admission medications   Medication Sig Start Date End Date Taking? Authorizing Provider  acetaminophen (TYLENOL) 650 MG CR tablet Take 1,300 mg by mouth every 8 (eight) hours as needed for pain.    [provider]  albuterol (VENTOLIN HFA) 108 (90 Base) MCG/ACT inhaler Inhale 2 puffs into the lungs every 6 (six) hours as needed for wheezing or shortness of breath. 07/08/20   [name], MD  Ascorbic Acid (VITAMIN C) 500 MG CAPS Take 500 mg by mouth daily.    [provider]  aspirin EC 81 MG tablet Take 1 tablet (81 mg total) by mouth daily. 01/11/19   Jerline Pain, MD  cholecalciferol (VITAMIN D3) 25 MCG (1000 UNIT) tablet Take 1,000 Units by mouth daily.    [provider]  CRANBERRY EXTRACT PO Take 400 mg by mouth 2 (two) times daily.    [provider]  ezetimibe (ZETIA) 10 MG tablet Take 1 tablet (10 mg total) by mouth daily. 03/03/21    Jerline Pain, MD  FeFum-FePoly-FA-B Cmp-C-Biot (INTEGRA PLUS) CAPS Take 1 capsule by mouth every morning. 07/31/19   Heilingoetter, Cassandra L, PA-C  ferrous sulfate 325 (65 FE) MG tablet Take 325 mg by mouth daily with breakfast.    [provider]  furosemide (LASIX) 20 MG tablet Take 20 mg by mouth daily as needed for fluid. 08/29/21   [provider]  isosorbide mononitrate (IMDUR) 30 MG 24 hr tablet Take 1 tablet by mouth once daily 03/24/21   Jerline Pain, MD  nitroGLYCERIN (NITROSTAT) 0.4 MG SL tablet Place 1 tablet (0.4 mg total) under the tongue every 5 (five) minutes as needed for chest pain. 07/08/20 09/05/21  [name], MD  phenazopyridine (PYRIDIUM) 200 MG tablet Take 1 tablet (200 mg total) by mouth 3 (three) times daily as needed for pain. 09/18/21   [name], MD  rosuvastatin (CRESTOR) 20 MG tablet Take 1 tablet by mouth once daily 11/29/20   Marin Olp, MD  sulfamethoxazole-trimethoprim (BACTRIM DS) 800-160 MG tablet Take 1 tablet by mouth 2 (two) times daily. 09/18/21   [name], MD  traMADol (ULTRAM) 50 MG tablet Take 1-2 tablets (50-100 mg total) by mouth every 6 (six) hours as needed for moderate pain. 09/18/21   [name], MD      Allergies    Lisinopril and  Codeine    Review of Systems   Review of Systems  Constitutional: Negative.   HENT: Negative.    Respiratory: Negative.    Cardiovascular: Negative.   Gastrointestinal: Negative.   Genitourinary: Negative.   Musculoskeletal: Negative.   Skin: Negative.   Neurological: Negative.   All other systems reviewed and are negative.   Physical Exam Updated Vital Signs BP 114/71 (BP Location: Left Arm)   Pulse 76   Temp 97.8 F (36.6 C)   Resp 18   Ht '5\' 6"'$  (1.676 m)   Wt 116.1 kg   LMP  (LMP Unknown)   SpO2 96%   BMI 41.32 kg/m  Physical Exam Vitals and nursing note reviewed.  Constitutional:      General: She is not in acute distress.    Appearance:  She is well-developed. She is not ill-appearing, toxic-appearing or diaphoretic.  HENT:     Head: Atraumatic.  Eyes:     Pupils: Pupils are equal, round, and reactive to light.  Cardiovascular:     Rate and Rhythm: Normal rate.  Pulmonary:     Effort: No respiratory distress.  Abdominal:     General: There is no distension.  Musculoskeletal:        General: Normal range of motion.     Cervical back: Normal range of motion.  Skin:    General: Skin is warm and dry.  Neurological:     General: No focal deficit present.     Mental Status: She is alert.  Psychiatric:        Mood and Affect: Mood normal.    ED Results / Procedures / Treatments   Labs (all labs ordered are listed, but only abnormal results are displayed) Labs Reviewed  BASIC METABOLIC PANEL - Abnormal; Notable for the following components:      Result Value   Glucose, Bld 114 (*)    Creatinine, Ser 1.17 (*)    GFR, Estimated 50 (*)    All other components within normal limits  CBC - Abnormal; Notable for the following components:   RBC 3.85 (*)    Hemoglobin 5.7 (*)    HCT 21.9 (*)    MCV 56.9 (*)    MCH 14.8 (*)    MCHC 26.0 (*)    RDW 24.7 (*)    nRBC 1.5 (*)    All other components within normal limits    EKG None  Radiology No results found.  Procedures Procedures    Medications Ordered in ED Medications - No data to display  ED Course/ Medical Decision Making/ A&P    71 year old history of anemia, hypertension, diabetes here for evaluation of abnormal lab.  Has been following with hematology for this with noted worsening anemia, she was supposed to follow-up to get blood transfusions and iron transfusions outpatient however she has not.  Was seen by PCP today who noted a new hemoglobin of 5.9.  Does states she has had some generalized fatigue.  She denies any bleeding, melena or blood per rectum.  Labs and imaging personally viewed and interpreted:  CBC without leukocytosis, hemoglobin  5.7 Metabolic panel creatinine 1.17  Recommended admission to the hospital and transfer to [facility] or [facility] for transfusion.  At this time patient refuses, admission and transfer.  Discussed with patient that we do not have inpatient beds here at this facility and she would need transfer as we do not have readily available blood products here.  She again, refuses admission and  transfer. States she is prefers to follow-up outpatient.  I discussed risk versus benefit.  Patient and family present in room voiced understand risk versus benefit and she is chosen to leave [place].  I did discuss close follow-up with her hematologist to see if they can get her in tomorrow for blood transfusion.  I messaged Dr. Julien Nordmann as well to make him aware that patient left AGAINST MEDICAL ADVICE with a critically low hemoglobin.   We discussed the nature and purpose, risks and benefits, as well as, the alternatives of treatment. Time was given to allow the opportunity to ask questions and consider their options, and after the discussion, the patient decided to refuse the offerred treatment. The patient was informed that refusal could lead to, but was not limited to, death, permanent disability, or severe pain. If present, I asked the relatives or significant others to dissuade them without success. Prior to refusing, I determined that the patient had the capacity to make their decision and understood the consequences of that decision. After refusal, I made every reasonable opportunity to treat them to the best of my ability.  The patient was notified that they may return to the emergency department at any time for further treatment.                              Medical Decision Making Amount and/or Complexity of Data Reviewed Independent Historian:     Details: family External Data Reviewed: labs and notes. Labs: ordered. Decision-making details documented in ED Course.  Risk OTC  drugs. Prescription drug management. Parenteral controlled substances. Decision regarding hospitalization. Diagnosis or treatment significantly limited by social determinants of health.          Final Clinical Impression(s) / ED Diagnoses Final diagnoses:  Symptomatic anemia    Rx / DC Orders ED Discharge Orders     None         Majorie Santee A, PA-C 12/08/21 1928    Cristie Hem, MD 12/08/21 2026

## 2021-12-08 NOTE — ED Provider Notes (Signed)
[facility] EMERGENCY DEPARTMENT Provider Note   CSN: [id number] Arrival date & time: 12/08/21  1956     History {Add pertinent medical, surgical, social history, OB history to HPI:1} Chief Complaint  Patient presents with   Blood Transfusion / HGB=5.7    [name] Cooper is a 71 y.o. female with a hx of hypertension, hyperlipidemia, COPD, diabetes, iron deficiency anemia, and CKD who presents to the ED requesting blood transfusion.  Patient states she has had a problem with anemia and associated fatigue/generalized weakness of unclear etiology for several months. She is followed by hem/onc Dr. Julien Nordmann.  She had outpatient blood work done that showed that her hemoglobin was in the 5s and was told to go to the ED. She went to a stand-alone emergency department earlier today and left [place].  She states that she called her primary care office and was told to return as this was likely to get worse if she did not have a transfusion.  She reports she continues to feel fatigued.  She has some intermittent lightheadedness and dyspnea. Reports continued dark stools with PO iron, no acute change. Denies fever, chills, nausea, vomiting, or syncope. Not anticoagulated, no regular use of NSAIDs or EtOH.   HPI     Home Medications Prior to Admission medications   Medication Sig Start Date End Date Taking? Authorizing Provider  acetaminophen (TYLENOL) 650 MG CR tablet Take 1,300 mg by mouth every 8 (eight) hours as needed for pain.    [provider]  albuterol (VENTOLIN HFA) 108 (90 Base) MCG/ACT inhaler Inhale 2 puffs into the lungs every 6 (six) hours as needed for wheezing or shortness of breath. 07/08/20   [name], MD  Ascorbic Acid (VITAMIN C) 500 MG CAPS Take 500 mg by mouth daily.    [provider]  aspirin EC 81 MG tablet Take 1 tablet (81 mg total) by mouth daily. 01/11/19   Jerline Pain, MD  cholecalciferol (VITAMIN D3) 25 MCG (1000  UNIT) tablet Take 1,000 Units by mouth daily.    [provider]  CRANBERRY EXTRACT PO Take 400 mg by mouth 2 (two) times daily.    [provider]  ezetimibe (ZETIA) 10 MG tablet Take 1 tablet (10 mg total) by mouth daily. 03/03/21   Jerline Pain, MD  FeFum-FePoly-FA-B Cmp-C-Biot (INTEGRA PLUS) CAPS Take 1 capsule by mouth every morning. 07/31/19   Heilingoetter, Cassandra L, PA-C  ferrous sulfate 325 (65 FE) MG tablet Take 325 mg by mouth daily with breakfast.    [provider]  furosemide (LASIX) 20 MG tablet Take 20 mg by mouth daily as needed for fluid. 08/29/21   [provider]  isosorbide mononitrate (IMDUR) 30 MG 24 hr tablet Take 1 tablet by mouth once daily 03/24/21   Jerline Pain, MD  nitroGLYCERIN (NITROSTAT) 0.4 MG SL tablet Place 1 tablet (0.4 mg total) under the tongue every 5 (five) minutes as needed for chest pain. 07/08/20 09/05/21  [name], MD  phenazopyridine (PYRIDIUM) 200 MG tablet Take 1 tablet (200 mg total) by mouth 3 (three) times daily as needed for pain. 09/18/21   [name], MD  rosuvastatin (CRESTOR) 20 MG tablet Take 1 tablet by mouth once daily 11/29/20   Marin Olp, MD  sulfamethoxazole-trimethoprim (BACTRIM DS) 800-160 MG tablet Take 1 tablet by mouth 2 (two) times daily. 09/18/21   [name], MD  traMADol (ULTRAM) 50 MG tablet Take 1-2 tablets (  50-100 mg total) by mouth every 6 (six) hours as needed for moderate pain. 09/18/21   [name], MD      Allergies    Lisinopril and Codeine    Review of Systems   Review of Systems  Constitutional:  Positive for fatigue. Negative for chills and fever.  Respiratory:  Positive for shortness of breath.   Cardiovascular:  Negative for chest pain.  Gastrointestinal:  Negative for abdominal pain, diarrhea and vomiting.  Neurological:  Positive for weakness and light-headedness. Negative for syncope.  All other systems reviewed and are  negative.   Physical Exam Updated Vital Signs BP 120/73 (BP Location: Left Arm)   Pulse 71   Temp 98.4 F (36.9 C) (Oral)   Resp 16   LMP  (LMP Unknown)   SpO2 91%  Physical Exam Vitals and nursing note reviewed.  Constitutional:      General: She is not in acute distress.    Appearance: She is well-developed. She is not toxic-appearing.  HENT:     Head: Normocephalic and atraumatic.  Eyes:     General:        Right eye: No discharge.        Left eye: No discharge.     Comments: Conjunctival pallor noted.  Cardiovascular:     Rate and Rhythm: Regular rhythm. Bradycardia present.  Pulmonary:     Effort: No respiratory distress.     Breath sounds: Normal breath sounds. No wheezing or rales.  Abdominal:     General: There is no distension.     Palpations: Abdomen is soft.     Tenderness: There is no abdominal tenderness.  Musculoskeletal:     Cervical back: Neck supple.  Skin:    General: Skin is warm and dry.  Neurological:     Mental Status: She is alert.     Comments: Clear speech.   Psychiatric:        Behavior: Behavior normal.     ED Results / Procedures / Treatments   Labs (all labs ordered are listed, but only abnormal results are displayed) Labs Reviewed - No data to display  EKG None  Radiology No results found.  Procedures Procedures  {Document cardiac monitor, telemetry assessment procedure when appropriate:1}  Medications Ordered in ED Medications - No data to display  ED Course/ Medical Decision Making/ A&P                           Medical Decision Making Amount and/or Complexity of Data Reviewed Labs: ordered.  Risk Prescription drug management.  Patient presents to the ED with complaints of ***, this involves an extensive number of treatment options, and is a complaint that carries with it a high risk of complications and morbidity. Nontoxic, vitals ***.   Ddx including but not limited to: ***  Additional history obtained:   Chart/nursing notes reviewed***.  External records viewed including: Heme-onc note from 10/08/2021 reviewed, iron deficiency anemia, bone marrow biopsy and aspiration in April 2023 was nonspecific but could not exclude early MDS.  She has had extensive studies in the past that were not conclusive for any underlying abnormality of her anemia except for iron deficiency anemia.  Has had outpatient iron and PRBC transfusions.  EKG: ***  Lab Tests:  I viewed & interpreted labs including:  ***  Imaging Studies:  I ordered and viewed the following imaging, agree with radiologist impression:  ***  ED Course:  I ordered medications including *** for ***  ***: RE-EVAL: ***   Based on patient's chief complaint, I considered admission might be necessary, however after reassuring ED workup feel patient is reasonable for discharge.   Critical Interventions: ***  Social determinants: *** Social determinants of health: this may be used for patients who have homelessness, poor access to medical care, need assistance with transition of care for placement, medication or transportation or follow up. You may also use the section for patients who struggle with substance abuse, have dementia, nursing home patients, patients who are severely disabled or have some degree of mental retardation and are not able to fully participate in their care.   Portions of this note were generated with [employer]. Dictation errors may occur despite best attempts at proofreading.   {Document critical care time when appropriate:1} {Document review of labs and clinical decision tools ie heart score, Chads2Vasc2 etc:1}  {Document your independent review of radiology images, and any outside records:1} {Document your discussion with family members, caretakers, and with consultants:1} {Document social determinants of health affecting pt's care:1} {Document your decision making why or why not admission,  treatments were needed:1} Final Clinical Impression(s) / ED Diagnoses Final diagnoses:  None    Rx / DC Orders ED Discharge Orders     None

## 2021-12-08 NOTE — ED Notes (Signed)
Spoke to blood bank on phone. Blood bank stated it may take a while for blood to be ready d/t pt having antibodies.

## 2021-12-09 ENCOUNTER — Ambulatory Visit: Payer: Medicare Other

## 2021-12-09 ENCOUNTER — Other Ambulatory Visit: Payer: Self-pay | Admitting: Physician Assistant

## 2021-12-09 ENCOUNTER — Inpatient Hospital Stay: Payer: Medicare Other | Admitting: Internal Medicine

## 2021-12-09 ENCOUNTER — Encounter (HOSPITAL_COMMUNITY): Payer: Self-pay | Admitting: Internal Medicine

## 2021-12-09 ENCOUNTER — Inpatient Hospital Stay: Payer: Medicare Other

## 2021-12-09 DIAGNOSIS — D649 Anemia, unspecified: Secondary | ICD-10-CM

## 2021-12-09 DIAGNOSIS — D500 Iron deficiency anemia secondary to blood loss (chronic): Secondary | ICD-10-CM

## 2021-12-09 DIAGNOSIS — D509 Iron deficiency anemia, unspecified: Secondary | ICD-10-CM | POA: Diagnosis not present

## 2021-12-09 LAB — IRON AND TIBC
Iron: 14 ug/dL — ABNORMAL LOW (ref 28–170)
Saturation Ratios: 3 % — ABNORMAL LOW (ref 10.4–31.8)
TIBC: 409 ug/dL (ref 250–450)
UIBC: 395 ug/dL

## 2021-12-09 LAB — VITAMIN B12: Vitamin B-12: 366 pg/mL (ref 180–914)

## 2021-12-09 LAB — PREPARE RBC (CROSSMATCH)

## 2021-12-09 LAB — FOLATE: Folate: 13 ng/mL (ref >5.9)

## 2021-12-09 LAB — FERRITIN: Ferritin: 7 ng/mL — ABNORMAL LOW (ref 11–307)

## 2021-12-09 LAB — POC OCCULT BLOOD, ED: Fecal Occult Bld: NEGATIVE

## 2021-12-09 MED ORDER — HYDRALAZINE HCL 20 MG/ML IJ SOLN: 5.0000 mg | INTRAMUSCULAR | Status: DC | PRN

## 2021-12-09 MED ORDER — POLYETHYLENE GLYCOL 3350 17 G PO PACK: 17.0000 g | PACK | Freq: Every day | ORAL | Status: DC | PRN

## 2021-12-09 MED ORDER — DOCUSATE SODIUM 100 MG PO CAPS
100.0000 mg | ORAL_CAPSULE | Freq: Two times a day (BID) | ORAL | Status: DC
  Administered 2021-12-09 – 2021-12-10 (×2): 100 mg via ORAL
  Filled 2021-12-09 (×3): qty 1

## 2021-12-09 MED ORDER — ASCORBIC ACID 500 MG PO TABS
500.0000 mg | ORAL_TABLET | Freq: Every day | ORAL | Status: DC
  Administered 2021-12-09 – 2021-12-10 (×2): 500 mg via ORAL
  Filled 2021-12-09 (×4): qty 1

## 2021-12-09 MED ORDER — ENOXAPARIN SODIUM 40 MG/0.4ML IJ SOSY
40.0000 mg | PREFILLED_SYRINGE | INTRAMUSCULAR | Status: DC
  Administered 2021-12-09 – 2021-12-10 (×2): 40 mg via SUBCUTANEOUS
  Filled 2021-12-09 (×2): qty 0.4

## 2021-12-09 MED ORDER — ALBUTEROL SULFATE (2.5 MG/3ML) 0.083% IN NEBU: 2.5000 mg | INHALATION_SOLUTION | Freq: Four times a day (QID) | RESPIRATORY_TRACT | Status: DC | PRN

## 2021-12-09 MED ORDER — BISACODYL 5 MG PO TBEC: 5.0000 mg | DELAYED_RELEASE_TABLET | Freq: Every day | ORAL | Status: DC | PRN

## 2021-12-09 MED ORDER — ORAL CARE MOUTH RINSE: 15.0000 mL | OROMUCOSAL | Status: DC | PRN

## 2021-12-09 MED ORDER — ONDANSETRON HCL 4 MG/2ML IJ SOLN: 4.0000 mg | Freq: Four times a day (QID) | INTRAMUSCULAR | Status: DC | PRN

## 2021-12-09 MED ORDER — INTEGRA PLUS PO CAPS: 1.0000 | ORAL_CAPSULE | Freq: Every morning | ORAL | Status: DC

## 2021-12-09 MED ORDER — NA FERRIC GLUC CPLX IN SUCROSE 12.5 MG/ML IV SOLN
250.0000 mg | Freq: Once | INTRAVENOUS | Status: AC
  Administered 2021-12-10: 250 mg via INTRAVENOUS
  Filled 2021-12-09: qty 20

## 2021-12-09 MED ORDER — ISOSORBIDE MONONITRATE ER 30 MG PO TB24
30.0000 mg | ORAL_TABLET | Freq: Every day | ORAL | Status: DC
  Administered 2021-12-09: 30 mg via ORAL
  Filled 2021-12-09 (×2): qty 1

## 2021-12-09 MED ORDER — ENSURE ENLIVE PO LIQD: 237.0000 mL | Freq: Two times a day (BID) | ORAL | Status: DC

## 2021-12-09 MED ORDER — EZETIMIBE 10 MG PO TABS
10.0000 mg | ORAL_TABLET | Freq: Every day | ORAL | Status: DC
  Administered 2021-12-09: 10 mg via ORAL
  Filled 2021-12-09 (×2): qty 1

## 2021-12-09 MED ORDER — ACETAMINOPHEN 650 MG RE SUPP: 650.0000 mg | Freq: Four times a day (QID) | RECTAL | Status: DC | PRN

## 2021-12-09 MED ORDER — ASPIRIN 81 MG PO TBEC
81.0000 mg | DELAYED_RELEASE_TABLET | Freq: Every day | ORAL | Status: DC
  Administered 2021-12-09 – 2021-12-10 (×2): 81 mg via ORAL
  Filled 2021-12-09 (×2): qty 1

## 2021-12-09 MED ORDER — SODIUM CHLORIDE 0.9 % IV SOLN: 300.0000 mg | Freq: Once | INTRAVENOUS | Status: DC

## 2021-12-09 MED ORDER — ONDANSETRON HCL 4 MG PO TABS: 4.0000 mg | ORAL_TABLET | Freq: Four times a day (QID) | ORAL | Status: DC | PRN

## 2021-12-09 MED ORDER — ACETAMINOPHEN 325 MG PO TABS: 650.0000 mg | ORAL_TABLET | Freq: Four times a day (QID) | ORAL | Status: DC | PRN

## 2021-12-09 MED ORDER — ROSUVASTATIN CALCIUM 20 MG PO TABS
20.0000 mg | ORAL_TABLET | Freq: Every day | ORAL | Status: DC
  Administered 2021-12-09: 20 mg via ORAL
  Filled 2021-12-09 (×2): qty 1

## 2021-12-09 NOTE — ED Notes (Addendum)
Blood Bank called and stated blood still is not ready and requested another type and screen tubes be sent down as extra.

## 2021-12-09 NOTE — ED Notes (Signed)
Called blood bank to see if blood was ready for pt. Blood bank stated they were still working on the blood and that they would notify RN when blood is ready.

## 2021-12-09 NOTE — ED Notes (Signed)
2nd set of type and screen sent to lab.

## 2021-12-09 NOTE — ED Notes (Signed)
RN contacted blood bank regarding PRBC for pt. Blood bank still working on PRBC.

## 2021-12-09 NOTE — ED Notes (Signed)
Help sit patient up in the bed patient is eating her lunch with call bell in reach

## 2021-12-09 NOTE — ED Notes (Signed)
Pt ambulated to bathroom and back to bed. Pt is resting in bed comfortably.

## 2021-12-09 NOTE — H&P (Signed)
History and Physical    PatientRashunda Cooper TLX:[id number] DOB: 1950/12/27 DOA: 12/08/2021 DOS: the patient was seen and examined on 12/09/2021 PCP: [name], NP  Patient coming from: Home - lives with brother; NOK: Daughter, [name] Cooper [phone]   Chief Complaint: Abnormal lab  HPI: [name] Cooper is a 71 y.o. female with medical history significant of anemia; chronic pain; COPD; DM; HTN; and HLD presenting with fatigue, anemia.  She was initially seen at [facility] and left AMA, refusing transfer.  She then presented to [facility] ER, Hgb 5.7.   She has only had one prior transfusion, earlier this year.  She has been feeling tired all the time, SOB with ambulation.  Her Hgb was 7.0 on 6/21; they discussed a transfusion but decided not to give it.  She was so fatigued and felt like she had to come in.  She has had IV iron in the past, last in the spring with some improvement.  She does take iron pills.  No blood in stools.    She was last seen by Dr. Julien Nordmann on 6/21 for microcytic anemia.  She was given IV iron with direction to give prn weekly for 3 weeks at a time plus oral integra plus.  She has had a bone marrow biopsy with early MDS not ruled out.  She is due to see Dr. Julien Nordmann again now.    ER Course:  Carryover, per Dr. Alcario Drought:  Pt with symptomatic anemia, chronic anemia.  Followed by heme/onc, gotten outpt transfusions in past.  HGB 5.7 today.  EDP was going to try and transfuse and send home, but still hasnt gotten blood.   Hemoccult neg.  Extensive work up in past.  Likely can DC after transfusion if they can just find compatible blood...     Review of Systems: As mentioned in the history of present illness. All other systems reviewed and are negative. Past Medical History:  Diagnosis Date   Anemia    Arthritis    Cancer ([place])    breast and ovarian   Chicken pox    Chronic lower back pain    COPD (chronic obstructive pulmonary disease) (HCC)    Diabetes (HCC)    GERD  (gastroesophageal reflux disease)    Hepatitis 1970's   History of recurrent UTIs    Hyperlipemia    Hypertension    Slow heart rate dx'd 09/24/2014   Urine incontinence    Past Surgical History:  Procedure Laterality Date   ABDOMINAL HYSTERECTOMY  1984   APPENDECTOMY     BREAST BIOPSY Right 1996   BREAST LUMPECTOMY Right 1996   CYSTOSCOPY WITH URETEROSCOPY AND STENT PLACEMENT Left 09/18/2021   Procedure: CYSTOSCOPY WITH LEFT DIAGNOSTIC URETEROSCOPY, RETROGRADE PYELOGRAM, AND STENT PLACEMENT;  Surgeon: [name], MD;  Location: [facility];  Service: Urology;  Laterality: Left;  14 MINS   LEFT HEART CATH AND CORONARY ANGIOGRAPHY N/A 01/13/2019   Procedure: LEFT HEART CATH AND CORONARY ANGIOGRAPHY;  Surgeon: Nelva Bush, MD;  Location: [facility];  Service: Cardiovascular;  Laterality: N/A;   MASTECTOMY Right 1996   TONSILLECTOMY     Social History:  reports that she has been smoking cigarettes. She has a 28.00 pack-year smoking history. She has never used smokeless tobacco. She reports that she does not drink alcohol and does not use drugs.  Allergies  Allergen Reactions   Lisinopril Anaphylaxis   Codeine     REACTION: MAKES HER SLEEPY    Family History  Problem  Relation Age of Onset   Cancer Mother    Early death Mother    Arthritis Brother    Colon cancer Neg Hx    Esophageal cancer Neg Hx    Rectal cancer Neg Hx    Stomach cancer Neg Hx     Prior to Admission medications   Medication Sig Start Date End Date Taking? Authorizing Provider  acetaminophen (TYLENOL) 650 MG CR tablet Take 1,300 mg by mouth every 8 (eight) hours as needed for pain.    [provider]  albuterol (VENTOLIN HFA) 108 (90 Base) MCG/ACT inhaler Inhale 2 puffs into the lungs every 6 (six) hours as needed for wheezing or shortness of breath. 07/08/20   [name], MD  Ascorbic Acid (VITAMIN C) 500 MG CAPS Take 500 mg by mouth daily.    [provider]  aspirin EC 81  MG tablet Take 1 tablet (81 mg total) by mouth daily. 01/11/19   Jerline Pain, MD  cholecalciferol (VITAMIN D3) 25 MCG (1000 UNIT) tablet Take 1,000 Units by mouth daily.    [provider]  CRANBERRY EXTRACT PO Take 400 mg by mouth 2 (two) times daily.    [provider]  ezetimibe (ZETIA) 10 MG tablet Take 1 tablet (10 mg total) by mouth daily. 03/03/21   Jerline Pain, MD  FeFum-FePoly-FA-B Cmp-C-Biot (INTEGRA PLUS) CAPS Take 1 capsule by mouth every morning. 07/31/19   Heilingoetter, Cassandra L, PA-C  ferrous sulfate 325 (65 FE) MG tablet Take 325 mg by mouth daily with breakfast.    [provider]  furosemide (LASIX) 20 MG tablet Take 20 mg by mouth daily as needed for fluid. 08/29/21   [provider]  isosorbide mononitrate (IMDUR) 30 MG 24 hr tablet Take 1 tablet by mouth once daily 03/24/21   Jerline Pain, MD  nitroGLYCERIN (NITROSTAT) 0.4 MG SL tablet Place 1 tablet (0.4 mg total) under the tongue every 5 (five) minutes as needed for chest pain. 07/08/20 09/05/21  [name], MD  phenazopyridine (PYRIDIUM) 200 MG tablet Take 1 tablet (200 mg total) by mouth 3 (three) times daily as needed for pain. 09/18/21   [name], MD  rosuvastatin (CRESTOR) 20 MG tablet Take 1 tablet by mouth once daily 11/29/20   Marin Olp, MD  sulfamethoxazole-trimethoprim (BACTRIM DS) 800-160 MG tablet Take 1 tablet by mouth 2 (two) times daily. 09/18/21   [name], MD  traMADol (ULTRAM) 50 MG tablet Take 1-2 tablets (50-100 mg total) by mouth every 6 (six) hours as needed for moderate pain. 09/18/21   [name], MD    Physical Exam: Vitals:   12/09/21 0745 12/09/21 0800 12/09/21 0815 12/09/21 0830  BP: 133/70 (!) 146/71 (!) 150/70 (!) 147/70  Pulse: (!) 52 67 (!) 52 (!) 53  Resp: 19 (!) 23 (!) 24 (!) 22  Temp:      TempSrc:      SpO2: 96% 98% 98% 97%   General:  Appears calm and comfortable and is in NAD, pale,  fatigued-appearing Eyes:  PERRL, EOMI, normal lids, iris ENT:  grossly normal hearing, lips & tongue, mmm; artificial dentition Neck:  no LAD, masses or thyromegaly Cardiovascular:  RRR, no m/r/g. No LE edema.  Respiratory:   CTA bilaterally with no wheezes/rales/rhonchi.  Normal respiratory effort. Abdomen:  soft, NT, ND Skin:  no rash or induration seen on limited exam Musculoskeletal:  grossly normal tone BUE/BLE, good ROM, no bony abnormality Psychiatric:  blunted mood and affect, speech fluent and appropriate, AOx3 Neurologic:  CN 2-12 grossly intact, moves all extremities in coordinated fashion   Radiological Exams on Admission: Independently reviewed - see discussion in A/P where applicable  No results found.  EKG: Independently reviewed.  Sinus bradycardia with rate 56; nonspecific ST changes with NSCSLT   Labs on Admission: I have personally reviewed the available labs and imaging studies at the time of the admission.  Pertinent labs:    Glucose 114 BUN 14/Creatinine 1.17/GFR 50 WBC 8.9 Hgb 5.7, 7 on 6/21 Iron 14 Ferritin 7 Heme negative   Assessment and Plan: Principal Problem:   Symptomatic anemia Active Problems:   HTN (hypertension)   Pure hypercholesterolemia   Emphysema of lung (HCC)   Tobacco dependence   Morbid obesity (HCC)   Diabetes mellitus without complication (HCC)   CKD (chronic kidney disease) stage 3, GFR 30-59 ml/min (HCC)    Symptomatic anemia -Patient with known h/o iron deficiency anemia presenting with chronic fatigue and progressive anemia -She has been transfused x 1 (2 units) and also been treated with IV and PO iron -Hgb 5.7; 7.0 on 6/21 -MCV 56.9, RDW 24.7 -With an MCV <70, elevated RDW, and reason for iron deficiency - iron deficiency can be presumed -Ferritin is <15, diagnostic of iron deficiency -Heme negative -Will observe overnight in med surg -Transfuse 2 units PRBC  -Will also give IV iron infusion with plan for  weekly infusions x 3 -Will add Dr. Julien Nordmann to the treatment team; he has been notified of admission and the need for close outpatient f/u  Chronic pain -I have reviewed this patient in the [place] Controlled Substances Reporting System.  She has not received recent meds, last rx was for tramadol on 6/26 #60 -She is not at particularly high risk of opioid misuse, diversion, or overdose.   COPD with ongoing tobacco dependence -Continue Albuterol -Smoking cessation encouraged -She declined a nicotine patch  DM -Recent A1c was 5.4 -She is not on medications for this issue -Will follow with fasting labs, no coverage for now  HTN/CAD -Continue Imdur monotherapy  HLD -Continue Crestor, Zetia  Stage 3a CKD -Appears to be stable at this time -Will recheck BMP in AM  Morbid obesity -BMI is 41.32 -Weight loss should be encouraged -Outpatient PCP/bariatric medicine/bariatric surgery f/u encouraged   DNR -I have discussed code status with the patient; she would not desire resuscitation and would prefer to die a natural death should that situation arise. -She will need a gold out of facility DNR form at the time of discharge      Advance Care Planning:   Code Status: DNR   Consults: Oncology (telephone only); nutrition  DVT Prophylaxis: Lovenox  Family Communication: None present; she declined to have me call her family at the time of admission  Severity of Illness: The appropriate patient status for this patient is OBSERVATION. Observation status is judged to be reasonable and necessary in order to provide the required intensity of service to ensure the patient's safety. The patient's presenting symptoms, physical exam findings, and initial radiographic and laboratory data in the context of their medical condition is felt to place them at decreased risk for further clinical deterioration. Furthermore, it is anticipated that the patient will be medically stable for discharge from the  hospital within 2 midnights of admission.   Author: Karmen Bongo, MD 12/09/2021 8:41 AM  For on call review [email or web address].

## 2021-12-10 DIAGNOSIS — D649 Anemia, unspecified: Secondary | ICD-10-CM | POA: Diagnosis not present

## 2021-12-10 DIAGNOSIS — D509 Iron deficiency anemia, unspecified: Secondary | ICD-10-CM | POA: Diagnosis not present

## 2021-12-10 LAB — CBC
HCT: 29.2 % — ABNORMAL LOW (ref 36.0–46.0)
Hemoglobin: 8.3 g/dL — ABNORMAL LOW (ref 12.0–15.0)
MCH: 17.8 pg — ABNORMAL LOW (ref 26.0–34.0)
MCHC: 28.4 g/dL — ABNORMAL LOW (ref 30.0–36.0)
MCV: 62.5 fL — ABNORMAL LOW (ref 80.0–100.0)
Platelets: 225 10*3/uL (ref 150–400)
RBC: 4.67 MIL/uL (ref 3.87–5.11)
RDW: 31.1 % — ABNORMAL HIGH (ref 11.5–15.5)
WBC: 8.1 10*3/uL (ref 4.0–10.5)
nRBC: 1.6 % — ABNORMAL HIGH (ref 0.0–0.2)

## 2021-12-10 LAB — BPAM RBC
Blood Product Expiration Date: 202309232359
Blood Product Expiration Date: 202309282359
ISSUE DATE / TIME: 202308221513
ISSUE DATE / TIME: 202308221757
Unit Type and Rh: 9500
Unit Type and Rh: 9500

## 2021-12-10 LAB — TYPE AND SCREEN
ABO/RH(D): O POS
ABO/RH(D): O POS
Antibody Screen: POSITIVE
Antibody Screen: POSITIVE
DAT, IgG: NEGATIVE
Donor AG Type: NEGATIVE
Donor AG Type: NEGATIVE
PT AG Type: NEGATIVE
Unit division: 0
Unit division: 0

## 2021-12-10 LAB — BASIC METABOLIC PANEL
Anion gap: 8 (ref 5–15)
BUN: 13 mg/dL (ref 8–23)
CO2: 22 mmol/L (ref 22–32)
Calcium: 8.6 mg/dL — ABNORMAL LOW (ref 8.9–10.3)
Chloride: 111 mmol/L (ref 98–111)
Creatinine, Ser: 1.18 mg/dL — ABNORMAL HIGH (ref 0.44–1.00)
GFR, Estimated: 49 mL/min — ABNORMAL LOW (ref >60)
Glucose, Bld: 96 mg/dL (ref 70–99)
Potassium: 3.8 mmol/L (ref 3.5–5.1)
Sodium: 141 mmol/L (ref 135–145)

## 2021-12-10 LAB — GLUCOSE, CAPILLARY: Glucose-Capillary: 113 mg/dL — ABNORMAL HIGH (ref 70–99)

## 2021-12-10 MED ORDER — INSULIN ASPART 100 UNIT/ML IJ SOLN: 0.0000 [IU] | Freq: Three times a day (TID) | INTRAMUSCULAR | Status: DC

## 2021-12-10 NOTE — TOC Initial Note (Signed)
Transition of Care [facility]) - Initial/Assessment Note    Patient Details  Name: [name] Cooper MRN: [id number] Date of Birth: Sep 26, 1950  Transition of [facility] CM/SW Contact:    Curlene Labrum, RN Phone Number: 12/10/2021, 9:31 AM  Clinical Narrative:                 CM met with the patient at the bedside to discuss transitions of care needs.  The patient lives with her brother and was admitted for anemia.  Patient plans to schedule appointment with PCP provider after discharge from the hospital.  The patient is ambulatory and independent at home without DME needs.  PCP reminder will be placed in the discharge instructions for follow up in 1 week.  The patient's brother plans to transport her home by car when medically ready for discharge.  Medicare Observation letter given to patient at bedside.  CM with TOC Team will continue to follow the patient for discharge needs for home.  Expected Discharge Plan: Home/Self Care Barriers to Discharge: Continued Medical Work up   Patient Goals and CMS Choice Patient states their goals for this hospitalization and ongoing recovery are:: Patient plans to return home with brother. CMS Medicare.gov Compare Post Acute Care list provided to:: Patient Choice offered to / list presented to : Patient  Expected Discharge Plan and Services Expected Discharge Plan: Home/Self Care   Discharge Planning Services: CM Consult   Living arrangements for the past 2 months: Single Family Home                                      Prior Living Arrangements/Services Living arrangements for the past 2 months: Single Family Home Lives with:: Other (Comment) (Brother) Patient language and need for interpreter reviewed:: Yes Do you feel safe going back to the place where you live?: Yes      Need for Family Participation in Patient Care: Yes (Comment) Care giver support system in place?: Yes (comment)   Criminal Activity/Legal Involvement  Pertinent to Current Situation/Hospitalization: No - Comment as needed  Activities of Daily Living Home Assistive Devices/Equipment: None ADL Screening (condition at time of admission) Patient's cognitive ability adequate to safely complete daily activities?: Yes Is the patient deaf or have difficulty hearing?: No Does the patient have difficulty seeing, even when wearing glasses/contacts?: No Does the patient have difficulty concentrating, remembering, or making decisions?: No Patient able to express need for assistance with ADLs?: Yes Does the patient have difficulty dressing or bathing?: No Independently performs ADLs?: Yes (appropriate for developmental age) Does the patient have difficulty walking or climbing stairs?: No Weakness of Legs: None Weakness of Arms/Hands: None  Permission Sought/Granted Permission sought to share information with : Case Manager, Family Supports Permission granted to share information with : Yes, Verbal Permission Granted        Permission granted to share info w Relationship: brother     Emotional Assessment Appearance:: Appears stated age Attitude/Demeanor/Rapport: Gracious Affect (typically observed): Accepting Orientation: : Oriented to Self, Oriented to Place, Oriented to  Time, Oriented to Situation Alcohol / Substance Use: Not Applicable Psych Involvement: No (comment)  Admission diagnosis:  Symptomatic anemia [D64.9] Patient Active Problem List   Diagnosis Date Noted   Symptomatic anemia 12/09/2021   Coronary artery disease involving native coronary artery of native heart with angina pectoris ([place]) 03/03/2021   Localized swelling of forearm 12/11/2020   CKD (  chronic kidney disease) stage 3, GFR 30-59 ml/min (HCC) 10/11/2019   Heme positive stool 09/14/2019   Iron deficiency anemia 07/31/2019   Diabetes mellitus without complication ([place]) 88/87/5797   Dyspnea on exertion 01/13/2019   Morbid obesity ([place]) 10/12/2018   Former smoker  10/12/2018   Cough 10/11/2018   Microcytic anemia 06/15/2018   History of right mastectomy 06/15/2018   Vitamin D deficiency 06/15/2018   Emphysema of lung ([place]) 06/15/2018   Tobacco dependence 06/15/2018   Urine, incontinence, stress female 06/15/2018   Estrogen deficiency 03/30/2016   Rhinitis, allergic    HTN (hypertension) 09/24/2014   Pure hypercholesterolemia 09/24/2014   OA (osteoarthritis) 09/24/2014   History of solitary pulmonary nodule 03/21/2008   History of right breast cancer 03/21/2008   PCP:  [name], NP Pharmacy:   [place] ([place]), [place] - [address] [place]) [place] [address] Phone: [phone] Fax: [phone]  [facility] ([place]) - [place] [facility] [address] [place] [address] [place] [address] Phone: [phone] Fax: [phone]     Social Determinants of Health (SDOH) Interventions    Readmission Risk Interventions     No data to display

## 2021-12-10 NOTE — Discharge Summary (Signed)
Physician Discharge Summary  [name] Cooper FBP:[id number] DOB: [birth year] DOA: 12/08/2021  PCP: [name], NP  Admit date: 12/08/2021 Discharge date: 12/10/2021 Recommendations for Outpatient Follow-up:  Follow up with PCP and hem onc in 1 weeks-call for appointment Please obtain BMP/CBC in one week  Discharge Dispo: home Discharge Condition: Stable Code Status:   Code Status: DNR Diet recommendation:  Diet Order             Diet Carb Modified Fluid consistency: Thin; Room service appropriate? Yes  Diet effective now                    Brief/Interim Summary: 71 y.o.f w/ hx of anemia; chronic pain,COPD,DM,HTN, and HLD presented with fatigue, anemia.She was initially seen at [facility] and left AMA, refusing transfer.  She then presented to [facility] ER, Hgb 5.7.   She has only had one prior transfusion, earlier this year.Her Hgb was 7.0 on 6/21; they discussed a transfusion but decided not to give it.She was so fatigued and felt like she had to come in.She has had IV iron in the past, last in the spring with some improvement.She does take iron pills.No blood in stools.   She was last seen by Dr. Julien Nordmann on 6/21 for microcytic anemia.  She was given IV iron with direction to give prn weekly for 3 weeks at a time plus oral integra plus.She has had a bone marrow biopsy with early MDS not ruled out.  She is due to see Dr. Julien Nordmann again now. in ED- Hemoccult neg.Extensive work up in past.  Patient was admitted for transfusion. Received 2 units PRBC following with hemoglobin improving 8 g, stable renal function remained stable anemia work-up shows ferritin low at 7  Hemoglobin is stabilized after transfusion she feels well this morning, discussed with Dr. Julien Nordmann from oncology who will arrange for outpatient follow-up and okay for discharge  Discharge Diagnoses:  Principal Problem:   Symptomatic anemia Active Problems:   HTN (hypertension)   Pure hypercholesterolemia   Emphysema of lung  (HCC)   Tobacco dependence   Morbid obesity ([place])   Diabetes mellitus without complication (HCC)   CKD (chronic kidney disease) stage 3, GFR 30-59 ml/min (HCC) Symptomatic anemia Chronic iron deficiency anemia: Hemoglobin improved with transfusion.  Known to hematology oncology and followed closely has had recent bone marrow biopsy.  Also received IV iron, she will continue to follow-up with Dr. Julien Nordmann as outpatient for ongoing care., currently heme negative Recent Labs  Lab 12/08/21 1605 12/10/21 0312  HGB 5.7* 8.3*  HCT 21.9* 29.2*    Chronic pain continue her home meds COPD with ongoing tobacco dependence: Not in exacerbation continue inhalers  OE:UMPNTI A1c was 5.4, not on meds HTN/CAD/HLD: No chest pain continue home Imdur, statins, Zetia Stage 3a RWE:RXVQMGQ to be stable at this time Morbid obesity:BMI is 41.32-will benefit with weight loss PC follow-up. DNR  Consults: Oncololgy on chat Subjective: Alert and oriented resting comfortably.  Feels okay for discharge home today  Discharge Exam: Vitals:   12/10/21 0420 12/10/21 0800  BP: 122/60 130/83  Pulse: 68 63  Resp: 16   Temp: 98.8 F (37.1 C) 99 F (37.2 C)  SpO2: 94% 98%   General: Pt is alert, awake, not in acute distress Cardiovascular: RRR, S1/S2 +, no rubs, no gallops Respiratory: CTA bilaterally, no wheezing, no rhonchi Abdominal: Soft, NT, ND, bowel sounds + Extremities: no edema, no cyanosis  Discharge Instructions  Discharge Instructions     Discharge  instructions   Complete by: As directed    Please call call MD or return to ER for similar or worsening recurring problem that brought you to hospital or if any fever,nausea/vomiting,abdominal pain, uncontrolled pain, chest pain,  shortness of breath or any other alarming symptoms.  Please follow-up your doctor as instructed in a week time and call the office for appointment.  Please avoid alcohol, smoking, or any other illicit substance and  maintain healthy habits including taking your regular medications as prescribed.  You were cared for by a hospitalist during your hospital stay. If you have any questions about your discharge medications or the care you received while you were in the hospital after you are discharged, you can call the unit and ask to speak with the hospitalist on call if the hospitalist that took care of you is not available.  Once you are discharged, your primary care physician will handle any further medical issues. Please note that NO REFILLS for any discharge medications will be authorized once you are discharged, as it is imperative that you return to your primary care physician (or establish a relationship with a primary care physician if you do not have one) for your aftercare needs so that they can reassess your need for medications and monitor your lab values   Increase activity slowly   Complete by: As directed       Allergies as of 12/10/2021       Reactions   Lisinopril Anaphylaxis   Codeine    REACTION: MAKES HER SLEEPY        Medication List     TAKE these medications    acetaminophen 650 MG CR tablet Commonly known as: TYLENOL Take 1,300 mg by mouth every 8 (eight) hours as needed for pain.   albuterol 108 (90 Base) MCG/ACT inhaler Commonly known as: VENTOLIN HFA Inhale 2 puffs into the lungs every 6 (six) hours as needed for wheezing or shortness of breath.   aspirin EC 81 MG tablet Take 1 tablet (81 mg total) by mouth daily.   cholecalciferol 25 MCG (1000 UNIT) tablet Commonly known as: VITAMIN D3 Take 1,000 Units by mouth daily.   ezetimibe 10 MG tablet Commonly known as: ZETIA Take 1 tablet (10 mg total) by mouth daily.   furosemide 20 MG tablet Commonly known as: LASIX Take 20 mg by mouth daily as needed for fluid.   isosorbide mononitrate 30 MG 24 hr tablet Commonly known as: IMDUR Take 1 tablet by mouth once daily   nitroGLYCERIN 0.4 MG SL tablet Commonly  known as: Nitrostat Place 1 tablet (0.4 mg total) under the tongue every 5 (five) minutes as needed for chest pain.   rosuvastatin 20 MG tablet Commonly known as: CRESTOR Take 1 tablet by mouth once daily   traMADol 50 MG tablet Commonly known as: ULTRAM Take 1-2 tablets (50-100 mg total) by mouth every 6 (six) hours as needed for moderate pain.   Vitamin C 500 MG Caps Take 500 mg by mouth daily.        Follow-up Information     [name], NP. Schedule an appointment as soon as possible for a visit.   Specialty: Nurse Practitioner Why: Please call the clinic and schedule a hospital follow up in the next week. Contact information: [place] [address] [phone]         Curt Bears, MD Follow up in 1 week(s).   Specialty: Oncology Contact information: [address]  [place] [address] [phone]                Allergies  Allergen Reactions   Lisinopril Anaphylaxis   Codeine     REACTION: MAKES HER SLEEPY    The results of significant diagnostics from this hospitalization (including imaging, microbiology, ancillary and laboratory) are listed below for reference.    Microbiology: No results found for this or any previous visit (from the past 240 hour(s)).  Procedures/Studies: MM 3D SCREEN BREAST UNI LEFT  Result Date: 12/01/2021 CLINICAL DATA:  Screening. EXAM: DIGITAL SCREENING UNILATERAL LEFT MAMMOGRAM WITH CAD AND TOMOSYNTHESIS TECHNIQUE: Left screening digital craniocaudal and mediolateral oblique mammograms were obtained. Left screening digital breast tomosynthesis was performed. The images were evaluated with computer-aided detection. COMPARISON:  Previous exam(s). ACR Breast Density Category b: There are scattered areas of fibroglandular density. FINDINGS: The patient has had a right mastectomy. There are no findings suspicious for malignancy. IMPRESSION: No mammographic evidence of malignancy. A result letter of  this screening mammogram will be mailed directly to the patient. RECOMMENDATION: Screening mammogram in one year.  (Code:[id number]-L-70M) BI-RADS CATEGORY  1: Negative. Electronically Signed   By: Kristopher Oppenheim M.D.   On: 12/01/2021 11:49    Labs: BNP (last 3 results) No results for input(s): "BNP" in the last 8760 hours. Basic Metabolic Panel: Recent Labs  Lab 12/08/21 1605 12/10/21 0312  NA 142 141  K 3.8 3.8  CL 107 111  CO2 24 22  GLUCOSE 114* 96  BUN 14 13  CREATININE 1.17* 1.18*  CALCIUM 9.0 8.6*   Liver Function Tests: No results for input(s): "AST", "ALT", "ALKPHOS", "BILITOT", "PROT", "ALBUMIN" in the last 168 hours. No results for input(s): "LIPASE", "AMYLASE" in the last 168 hours. No results for input(s): "AMMONIA" in the last 168 hours. CBC: Recent Labs  Lab 12/08/21 1605 12/10/21 0312  WBC 8.9 8.1  HGB 5.7* 8.3*  HCT 21.9* 29.2*  MCV 56.9* 62.5*  PLT 299 225   Cardiac Enzymes: No results for input(s): "CKTOTAL", "CKMB", "CKMBINDEX", "TROPONINI" in the last 168 hours. BNP: Invalid input(s): "POCBNP" CBG: Recent Labs  Lab 12/10/21 0802  GLUCAP 113*   D-Dimer No results for input(s): "DDIMER" in the last 72 hours. Hgb A1c No results for input(s): "HGBA1C" in the last 72 hours. Lipid Profile No results for input(s): "CHOL", "HDL", "LDLCALC", "TRIG", "CHOLHDL", "LDLDIRECT" in the last 72 hours. Thyroid function studies No results for input(s): "TSH", "T4TOTAL", "T3FREE", "THYROIDAB" in the last 72 hours.  Invalid input(s): "FREET3" Anemia work up Recent Labs    12/08/21 2310  VITAMINB12 366  FOLATE 13.0  FERRITIN 7*  TIBC 409  IRON 14*  RETICCTPCT 2.2   Urinalysis epsis Labs Recent Labs  Lab 12/08/21 1605 12/10/21 0312  WBC 8.9 8.1   Microbiology No results found for this or any previous visit (from the past 240 hour(s)).   Time coordinating discharge: 25 minutes  SIGNED: Antonieta Pert, MD  [facility] 12/10/2021, 9:45 AM  If  7PM-7AM, please contact night-coverage [email or web address]

## 2021-12-10 NOTE — Plan of Care (Signed)

## 2021-12-10 NOTE — Progress Notes (Signed)
Reviewed medications, discharge instructions and follow-up appointments with patient. All of patient's belongings returned to patient. Patient verbalized understanding of all discharge instructions.

## 2021-12-10 NOTE — Care Management Obs Status (Cosign Needed)
[place] NOTIFICATION   Patient Details  Name: [name] Cooper MRN: [id number] Date of Birth: [birth year]   Medicare Observation Status Notification Given:  Yes    Curlene Labrum, RN 12/10/2021, 9:19 AM

## 2021-12-10 NOTE — Hospital Course (Addendum)
71 y.o.f w/ hx of anemia; chronic pain,COPD,DM,HTN, and HLD presented with fatigue, anemia.She was initially seen at [facility] and left AMA, refusing transfer.  She then presented to [facility] ER, Hgb 5.7.   She has only had one prior transfusion, earlier this year.Her Hgb was 7.0 on 6/21; they discussed a transfusion but decided not to give it.She was so fatigued and felt like she had to come in.She has had IV iron in the past, last in the spring with some improvement.She does take iron pills.No blood in stools.   She was last seen by Dr. Julien Nordmann on 6/21 for microcytic anemia.  She was given IV iron with direction to give prn weekly for 3 weeks at a time plus oral integra plus.She has had a bone marrow biopsy with early MDS not ruled out.  She is due to see Dr. Julien Nordmann again now. in ED- Hemoccult neg.Extensive work up in past.  Patient was admitted for transfusion. Received 2 units PRBC following with hemoglobin improving 8 g, stable renal function remained stable anemia work-up shows ferritin low at 7

## 2021-12-12 ENCOUNTER — Ambulatory Visit
Admission: RE | Admit: 2021-12-12 | Discharge: 2021-12-12 | Disposition: A | Discharge status: Home / Self Care | Payer: Medicare Other | Source: Ambulatory Visit

## 2021-12-12 DIAGNOSIS — Z87891 Personal history of nicotine dependence: Secondary | ICD-10-CM

## 2021-12-17 ENCOUNTER — Telehealth: Payer: Self-pay

## 2021-12-17 DIAGNOSIS — F17210 Nicotine dependence, cigarettes, uncomplicated: Secondary | ICD-10-CM

## 2021-12-17 DIAGNOSIS — Z87891 Personal history of nicotine dependence: Secondary | ICD-10-CM

## 2021-12-17 DIAGNOSIS — Z122 Encounter for screening for malignant neoplasm of respiratory organs: Secondary | ICD-10-CM

## 2021-12-17 NOTE — Telephone Encounter (Signed)
Contact patient by phone to review results of LDCT.

## 2021-12-17 NOTE — Telephone Encounter (Signed)
LDCT results reviewed.  Area of previous nodular appearance is read as stable and not suspicious for cancer at this time. Will continue to observe with annual lung screening for any changes in appearance. Continued yearly scan advised for monitoring the noted nodularity.  As previously seen on scans, emphysema and atherosclerosis were detected.  Patient is on statin medication.  Patient was satisfied with continuing yearly LDCT and had no further questions.  Offered an appointment with provider to discuss further, if desired.  Order placed for annual LDCt and results faxed to PCP.

## 2021-12-17 NOTE — Addendum Note (Signed)
Addended by: Joella Prince on: 12/17/2021 12:23 PM   Modules accepted: Orders

## 2021-12-17 NOTE — Telephone Encounter (Signed)
Recent LDCT Rads2 with the largest is non-solid nodule measuring 11.7 mm within the right middle lobe. Previous LDCT n 2022.  Due to size, please review and advise before we release results to the patient.  Routed to Dr. Valeta Harms for review.

## 2021-12-29 ENCOUNTER — Other Ambulatory Visit: Payer: Self-pay | Admitting: Pharmacy Technician

## 2021-12-29 ENCOUNTER — Telehealth: Payer: Self-pay | Admitting: Medical Oncology

## 2021-12-29 ENCOUNTER — Inpatient Hospital Stay (HOSPITAL_BASED_OUTPATIENT_CLINIC_OR_DEPARTMENT_OTHER): Payer: Medicare Other | Admitting: Internal Medicine

## 2021-12-29 ENCOUNTER — Other Ambulatory Visit: Payer: Self-pay

## 2021-12-29 ENCOUNTER — Inpatient Hospital Stay: Payer: Medicare Other | Attending: Internal Medicine

## 2021-12-29 ENCOUNTER — Other Ambulatory Visit: Payer: Self-pay | Admitting: Internal Medicine

## 2021-12-29 VITALS — BP 136/71 | HR 55 | Temp 97.9°F | Resp 15 | Wt 257.9 lb

## 2021-12-29 DIAGNOSIS — D509 Iron deficiency anemia, unspecified: Secondary | ICD-10-CM

## 2021-12-29 DIAGNOSIS — D500 Iron deficiency anemia secondary to blood loss (chronic): Secondary | ICD-10-CM

## 2021-12-29 LAB — CBC WITH DIFFERENTIAL (CANCER CENTER ONLY)
Abs Immature Granulocytes: 0.01 10*3/uL (ref 0.00–0.07)
Basophils Absolute: 0.1 10*3/uL (ref 0.0–0.1)
Basophils Relative: 1 %
Eosinophils Absolute: 0.6 10*3/uL — ABNORMAL HIGH (ref 0.0–0.5)
Eosinophils Relative: 7 %
HCT: 30.4 % — ABNORMAL LOW (ref 36.0–46.0)
Hemoglobin: 8.5 g/dL — ABNORMAL LOW (ref 12.0–15.0)
Immature Granulocytes: 0 %
Lymphocytes Relative: 23 %
Lymphs Abs: 1.9 10*3/uL (ref 0.7–4.0)
MCH: 18.6 pg — ABNORMAL LOW (ref 26.0–34.0)
MCHC: 28 g/dL — ABNORMAL LOW (ref 30.0–36.0)
MCV: 66.5 fL — ABNORMAL LOW (ref 80.0–100.0)
Monocytes Absolute: 0.7 10*3/uL (ref 0.1–1.0)
Monocytes Relative: 8 %
Neutro Abs: 5.2 10*3/uL (ref 1.7–7.7)
Neutrophils Relative %: 61 %
Platelet Count: 254 10*3/uL (ref 150–400)
RBC: 4.57 MIL/uL (ref 3.87–5.11)
RDW: 32.1 % — ABNORMAL HIGH (ref 11.5–15.5)
WBC Count: 8.4 10*3/uL (ref 4.0–10.5)
nRBC: 0 % (ref 0.0–0.2)

## 2021-12-29 LAB — IRON AND IRON BINDING CAPACITY (CC-WL,HP ONLY)
Iron: 14 ug/dL — ABNORMAL LOW (ref 28–170)
Saturation Ratios: 4 % — ABNORMAL LOW (ref 10.4–31.8)
TIBC: 321 ug/dL (ref 250–450)
UIBC: 307 ug/dL (ref 148–442)

## 2021-12-29 LAB — SAMPLE TO BLOOD BANK

## 2021-12-29 LAB — FERRITIN: Ferritin: 23 ng/mL (ref 11–307)

## 2021-12-29 NOTE — Telephone Encounter (Signed)
-----   Message from Curt Bears, MD sent at 12/29/2021  1:18 PM EDT ----- Please let her know that her iron was low and I ordered weekly iron infusion to be done at the [facility] starting this coming Friday for 3 weeks.  Thank you ----- Message ----- From: Buel Ream, Lab [facility] [place] Sent: 12/29/2021  11:14 AM EDT To: Curt Bears, MD

## 2021-12-29 NOTE — Progress Notes (Signed)
[facility] [phone]   Fax:([phone]) [phone]  OFFICE PROGRESS NOTE  [name], NP [address] [place] [address]  DIAGNOSIS: Iron deficiency anemia Bone marrow biopsy and aspirate in April 2023 was nonspecific but could not exclude early MDS.   PRIOR THERAPY: None   CURRENT THERAPY:  1) IV iron PRN with venofer 300 mg weekly for 3 weeks at a time.   2) Oral iron supplement with integra plus p.o. daily.    INTERVAL HISTORY: [name] Cooper 71 y.o. female returns to the clinic today for follow-up visit accompanied by her husband.  The patient is feeling much better today with less fatigue.  She was able to walk from the parking lot to the cancer center.  The patient denied having any chest pain but has shortness of breath with exertion with no cough or hemoptysis.  She has no nausea, vomiting, diarrhea or constipation.  She has no headache or visual changes.  She was seen at the emergency department few weeks ago with significant anemia and she was admitted to the hospital.  Her hemoglobin was down to 5.7.  She received PRBCs transfusion in addition to iron infusion during her hospitalization.  The patient is here today for evaluation and repeat blood work.   MEDICAL HISTORY: Past Medical History:  Diagnosis Date   Anemia    Arthritis    Cancer ([place])    breast and ovarian   Chicken pox    Chronic lower back pain    COPD (chronic obstructive pulmonary disease) (HCC)    Diabetes (HCC)    GERD (gastroesophageal reflux disease)    Hepatitis 1970's   History of recurrent UTIs    Hyperlipemia    Hypertension    Slow heart rate dx'd 09/24/2014   Urine incontinence     ALLERGIES:  is allergic to lisinopril and codeine.  MEDICATIONS:  Current Outpatient Medications  Medication Sig Dispense Refill   acetaminophen (TYLENOL) 650 MG CR tablet Take 1,300 mg by mouth every 8 (eight) hours as needed for pain.     albuterol (VENTOLIN HFA) 108 (90  Base) MCG/ACT inhaler Inhale 2 puffs into the lungs every 6 (six) hours as needed for wheezing or shortness of breath. 1 each 3   Ascorbic Acid (VITAMIN C) 500 MG CAPS Take 500 mg by mouth daily.     aspirin EC 81 MG tablet Take 1 tablet (81 mg total) by mouth daily. 90 tablet 3   cholecalciferol (VITAMIN D3) 25 MCG (1000 UNIT) tablet Take 1,000 Units by mouth daily.     ezetimibe (ZETIA) 10 MG tablet Take 1 tablet (10 mg total) by mouth daily. 90 tablet 3   furosemide (LASIX) 20 MG tablet Take 20 mg by mouth daily as needed for fluid.     isosorbide mononitrate (IMDUR) 30 MG 24 hr tablet Take 1 tablet by mouth once daily (Patient taking differently: Take 30 mg by mouth daily.) 90 tablet 3   nitroGLYCERIN (NITROSTAT) 0.4 MG SL tablet Place 1 tablet (0.4 mg total) under the tongue every 5 (five) minutes as needed for chest pain. 25 tablet prn   rosuvastatin (CRESTOR) 20 MG tablet Take 1 tablet by mouth once daily (Patient taking differently: Take 20 mg by mouth daily.) 21 tablet 1   traMADol (ULTRAM) 50 MG tablet Take 1-2 tablets (50-100 mg total) by mouth every 6 (six) hours as needed for moderate pain. 15 tablet 0   No current facility-administered  medications for this visit.    SURGICAL HISTORY:  Past Surgical History:  Procedure Laterality Date   ABDOMINAL HYSTERECTOMY  1984   APPENDECTOMY     BREAST BIOPSY Right 1996   BREAST LUMPECTOMY Right 1996   CYSTOSCOPY WITH URETEROSCOPY AND STENT PLACEMENT Left 09/18/2021   Procedure: CYSTOSCOPY WITH LEFT DIAGNOSTIC URETEROSCOPY, RETROGRADE PYELOGRAM, AND STENT PLACEMENT;  Surgeon: [name], MD;  Location: [facility];  Service: Urology;  Laterality: Left;  57 MINS   LEFT HEART CATH AND CORONARY ANGIOGRAPHY N/A 01/13/2019   Procedure: LEFT HEART CATH AND CORONARY ANGIOGRAPHY;  Surgeon: Nelva Bush, MD;  Location: [facility];  Service: Cardiovascular;  Laterality: N/A;   MASTECTOMY Right 1996   TONSILLECTOMY      REVIEW OF  SYSTEMS:  A comprehensive review of systems was negative except for: Constitutional: positive for fatigue   PHYSICAL EXAMINATION: General appearance: alert, cooperative, fatigued, and no distress Head: Normocephalic, without obvious abnormality, atraumatic Neck: no adenopathy, no JVD, supple, symmetrical, trachea midline, and thyroid not enlarged, symmetric, no tenderness/mass/nodules Lymph nodes: Cervical, supraclavicular, and axillary nodes normal. Resp: clear to auscultation bilaterally Back: symmetric, no curvature. ROM normal. No CVA tenderness. Cardio: regular rate and rhythm, S1, S2 normal, no murmur, click, rub or gallop GI: soft, non-tender; bowel sounds normal; no masses,  no organomegaly Extremities: extremities normal, atraumatic, no cyanosis or edema  ECOG PERFORMANCE STATUS: 1 - Symptomatic but completely ambulatory  Blood pressure 136/71, pulse (!) 55, temperature 97.9 F (36.6 C), temperature source Oral, resp. rate 15, weight 257 lb 14.4 oz (117 kg), SpO2 97 %.  LABORATORY DATA: Lab Results  Component Value Date   WBC 8.1 12/10/2021   HGB 8.3 (L) 12/10/2021   HCT 29.2 (L) 12/10/2021   MCV 62.5 (L) 12/10/2021   PLT 225 12/10/2021      Chemistry      Component Value Date/Time   NA 141 12/10/2021 0312   NA 140 01/30/2019 1330   K 3.8 12/10/2021 0312   CL 111 12/10/2021 0312   CO2 22 12/10/2021 0312   BUN 13 12/10/2021 0312   BUN 18 01/30/2019 1330   CREATININE 1.18 (H) 12/10/2021 0312   CREATININE 1.20 (H) 12/21/2019 1336   CREATININE 1.28 (H) 09/21/2016 1115      Component Value Date/Time   CALCIUM 8.6 (L) 12/10/2021 0312   ALKPHOS 88 07/08/2020 1422   AST 13 07/08/2020 1422   AST 12 (L) 12/21/2019 1336   ALT 10 06/05/2021 0748   ALT 8 12/21/2019 1336   BILITOT 0.4 07/08/2020 1422   BILITOT 0.4 12/21/2019 1336       RADIOGRAPHIC STUDIES: CT CHEST LUNG CA SCREEN LOW DOSE W/O CM  Result Date: 12/15/2021 CLINICAL DATA:  Lung cancer screening.  Current asymptomatic smoker with 30 pack-year history. Asymptomatic. EXAM: CT CHEST WITHOUT CONTRAST LOW-DOSE FOR LUNG CANCER SCREENING TECHNIQUE: Multidetector CT imaging of the chest was performed following the standard protocol without IV contrast. RADIATION DOSE REDUCTION: This exam was performed according to the departmental dose-optimization program which includes automated exposure control, adjustment of the mA and/or kV according to patient size and/or use of iterative reconstruction technique. COMPARISON:  12/12/2020 FINDINGS: Cardiovascular: Normal heart size. Aortic atherosclerosis and multi vessel coronary artery calcifications. Mediastinum/Nodes: No enlarged mediastinal, hilar, or axillary lymph nodes. Thyroid gland, trachea, and esophagus demonstrate no significant findings. Lungs/Pleura: No pleural effusion or airspace consolidation. Mild centrilobular and paraseptal emphysema. These are unchanged compared with the previous exam. The largest  is non-solid measuring 11.7 mm within the right middle lobe. No suspicious lung nodules identified at this time. Upper Abdomen: No acute abnormality. Simple cyst arises off the upper pole of right kidney measuring 2.4 cm, image 53/3. No follow-up imaging recommended. Musculoskeletal: No chest wall mass or suspicious bone lesions identified. IMPRESSION: 1. Lung-RADS 2, benign appearance or behavior. Continue annual screening with low-dose chest CT without contrast in 12 months. 2. Multi vessel coronary artery calcifications. 3. Aortic Atherosclerosis (ICD10-I70.0) and Emphysema (ICD10-J43.9). Electronically Signed   By: Kerby Moors M.D.   On: 12/15/2021 06:24   ASSESSMENT AND PLAN: This is a very pleasant 71 years old African-American female with iron deficiency anemia status post Venofer infusion in October 2021.   The patient is status post iron infusion with Venofer completed last year and tolerated it fairly well.  She has been on oral iron tablet with  Integra +1 capsule p.o. daily. The patient was treated with iron infusion with Venofer several times recently even at a higher dose of 400 mg but no significant improvement in her anemia.   She had a recent bone marrow biopsy and aspirate that showed no specific findings unlikely to be reactive in nature but early MDS could not be excluded. She had extensive blood work in the past that was unremarkable to explain the underlying etiology of her microcytic anemia except for the possibility of gastrointestinal blood loss but she had colonoscopy and upper endoscopy in July 2021 with removal of few polyps.  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 CBC today showed hemoglobin of 8.5. I recommended for the patient to continue on the oral iron tablet for now. I will monitor her closely with repeat blood work in 3 months but she was advised to call immediately if she has any concerning symptoms or increasing fatigue in the interval.  The patient does not prefer to be seen at frequent basis and she requested her appointment to be done in 3 months.  She understands that she could develop severe anemia in the interval. She was advised to call if she has any other concerning issues in the interval. The patient voices understanding of current disease status and treatment options and is in agreement with the current care plan.  All questions were answered. The patient knows to call the clinic with any problems, questions or concerns. We can certainly see the patient much sooner if necessary.  Disclaimer: This note was dictated with voice recognition software. Similar sounding words can inadvertently be transcribed and may not be corrected upon review.

## 2021-12-29 NOTE — Telephone Encounter (Signed)
Pt.notified

## 2022-01-02 ENCOUNTER — Ambulatory Visit (INDEPENDENT_AMBULATORY_CARE_PROVIDER_SITE_OTHER): Payer: Medicare Other | Admitting: *Deleted

## 2022-01-02 VITALS — BP 123/66 | HR 51 | Temp 97.8°F | Resp 16 | Ht 66.0 in | Wt 259.6 lb

## 2022-01-02 DIAGNOSIS — D500 Iron deficiency anemia secondary to blood loss (chronic): Secondary | ICD-10-CM | POA: Diagnosis not present

## 2022-01-02 MED ORDER — SODIUM CHLORIDE 0.9 % IV SOLN
300.0000 mg | INTRAVENOUS | Status: DC
  Administered 2022-01-02: 300 mg via INTRAVENOUS
  Filled 2022-01-02: qty 15

## 2022-01-02 NOTE — Progress Notes (Signed)
Diagnosis: Iron Deficiency Anemia  Provider:  Marshell Garfinkel MD  Procedure: Infusion  IV Type: Peripheral, IV Location: L Forearm  Venofer (Iron Sucrose), Dose: 300 mg  Infusion Start Time: 1106 am  Infusion Stop Time: 1674 pm  Post Infusion IV Care: Observation period completed and Peripheral IV Discontinued  Discharge: Condition: Good, Destination: Home . AVS provided to patient.   Performed by:  Oren Beckmann, RN

## 2022-01-09 ENCOUNTER — Ambulatory Visit (INDEPENDENT_AMBULATORY_CARE_PROVIDER_SITE_OTHER): Payer: Medicare Other | Admitting: *Deleted

## 2022-01-09 VITALS — BP 132/64 | HR 53 | Temp 97.9°F | Resp 16 | Ht 66.0 in | Wt 260.6 lb

## 2022-01-09 DIAGNOSIS — D500 Iron deficiency anemia secondary to blood loss (chronic): Secondary | ICD-10-CM | POA: Diagnosis not present

## 2022-01-09 MED ORDER — SODIUM CHLORIDE 0.9 % IV SOLN
300.0000 mg | INTRAVENOUS | Status: DC
  Administered 2022-01-09: 300 mg via INTRAVENOUS
  Filled 2022-01-09: qty 15

## 2022-01-09 NOTE — Progress Notes (Signed)
Diagnosis: Iron Deficiency Anemia  Provider:  Marshell Garfinkel MD  Procedure: Infusion  IV Type: Peripheral, IV Location: R Forearm  Venofer (Iron Sucrose), Dose: 300 mg  Infusion Start Time: 1484 am  Infusion Stop Time: 0397 am  Post Infusion IV Care: Observation period completed and Peripheral IV Discontinued  Discharge: Condition: Good, Destination: Home . AVS provided to patient.   Performed by:  Oren Beckmann, RN

## 2022-01-16 ENCOUNTER — Ambulatory Visit (INDEPENDENT_AMBULATORY_CARE_PROVIDER_SITE_OTHER): Payer: Medicare Other

## 2022-01-16 VITALS — BP 131/80 | HR 52 | Temp 97.9°F | Resp 18 | Ht 66.0 in | Wt 262.0 lb

## 2022-01-16 DIAGNOSIS — D500 Iron deficiency anemia secondary to blood loss (chronic): Secondary | ICD-10-CM | POA: Diagnosis not present

## 2022-01-16 MED ORDER — SODIUM CHLORIDE 0.9 % IV SOLN
300.0000 mg | INTRAVENOUS | Status: DC
  Administered 2022-01-16: 300 mg via INTRAVENOUS
  Filled 2022-01-16: qty 15

## 2022-01-16 NOTE — Progress Notes (Signed)
Diagnosis: Iron Deficiency Anemia  Provider:  Marshell Garfinkel MD  Procedure: Infusion  IV Type: Peripheral, IV Location: L Forearm  Venofer (Iron Sucrose), Dose: 300 mg  Infusion Start Time: 1001  Infusion Stop Time: 0511  Post Infusion IV Care: Peripheral IV Discontinued  Discharge: Condition: Good, Destination: Home . AVS provided to patient.   Performed by:  Cleophus Molt, RN

## 2022-02-07 IMAGING — CT CT ABDOMEN W/O CM
2 of 4 series · 13 of 46 positions shown, 15 images · non-contrast
Comparison: None.

CLINICAL DATA: RIGHT renal lesion

EXAM:
CT ABDOMEN WITHOUT CONTRAST
TECHNIQUE: Multidetector CT imaging of the abdomen was performed following the
standard protocol without IV contrast.

[Series 2: abd without 5.00 br40 s3 axial · axial · non-contrast · 0.79mm/px · z∈[+1432,+1642]mm · 10 of 52 slices shown, 12 images]
[im 5/52  soft-tissue]
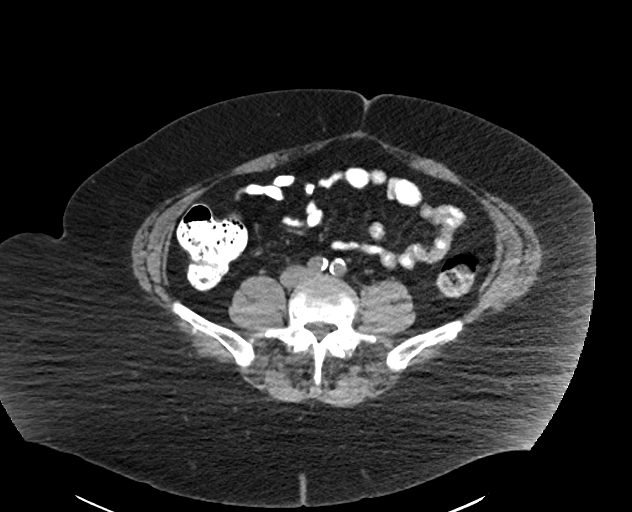
[im 5/52  bone]
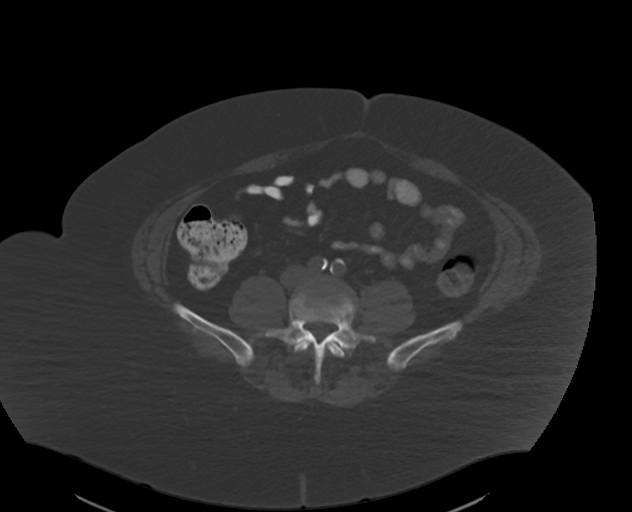
[im 9/52  soft-tissue]
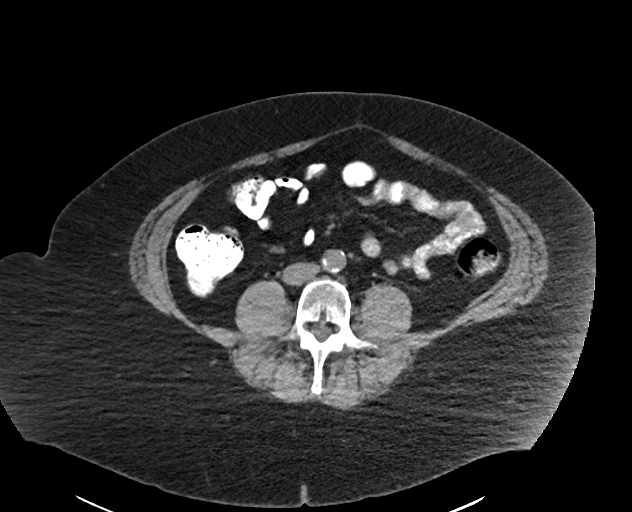
[im 15/52  soft-tissue]
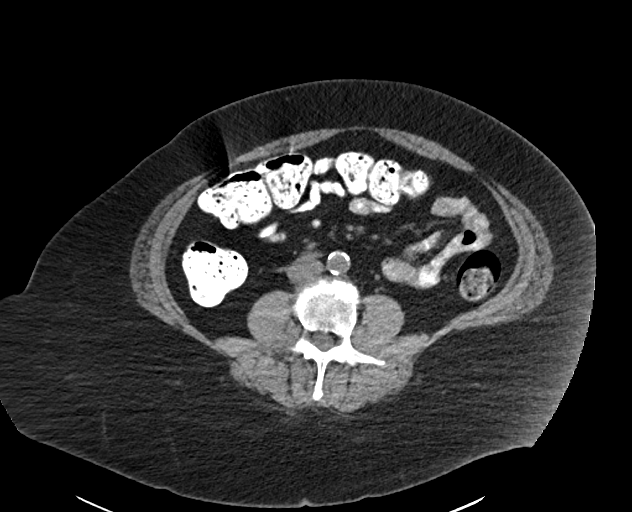
[im 19/52  soft-tissue]
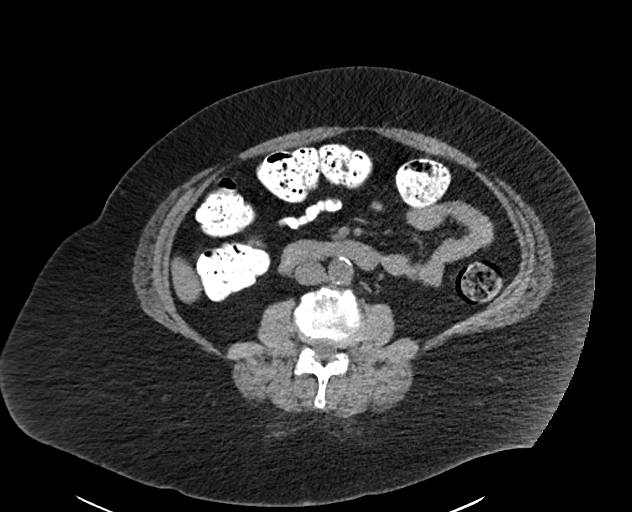
[im 23/52  soft-tissue]
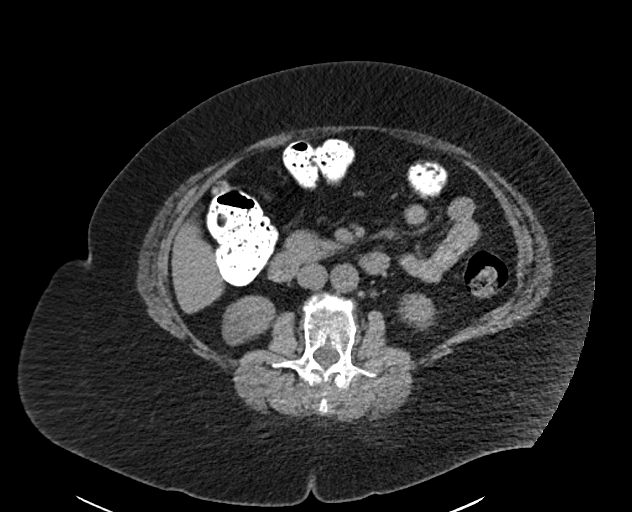
[im 29/52  soft-tissue]
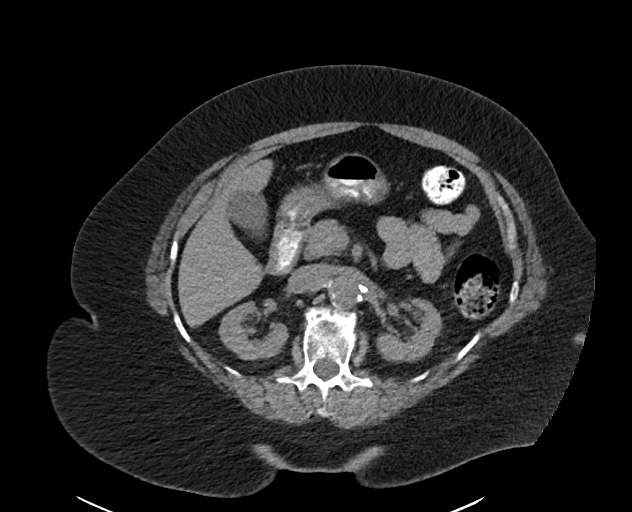
[im 33/52  soft-tissue]
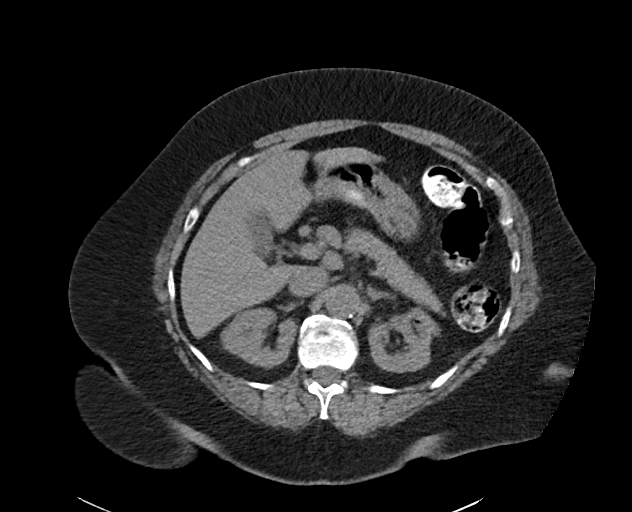
[im 39/52  soft-tissue]
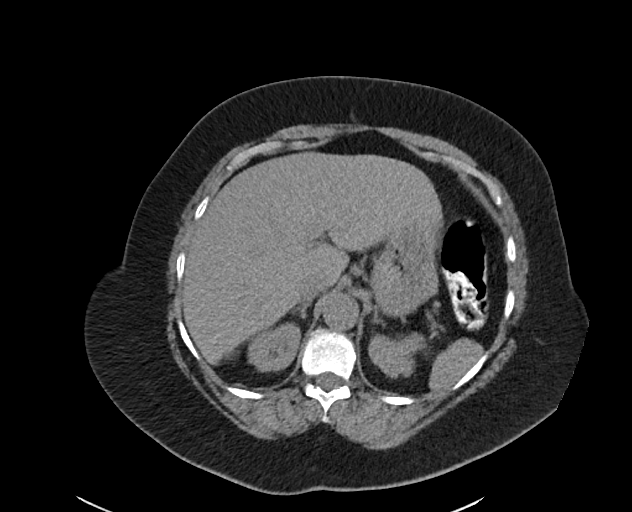
[im 43/52  soft-tissue]
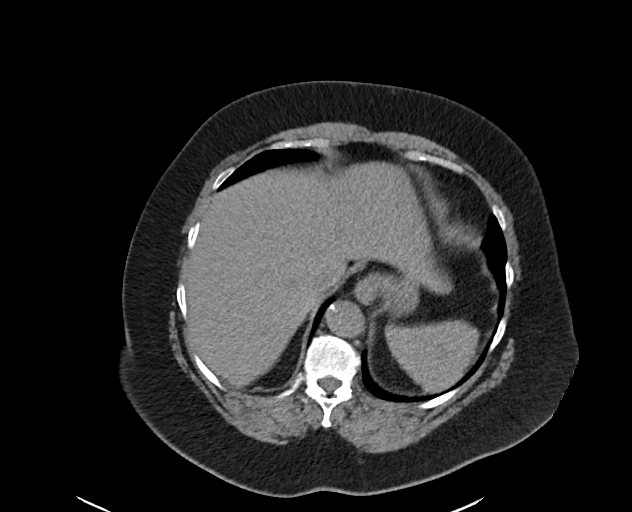
[im 43/52  bone]
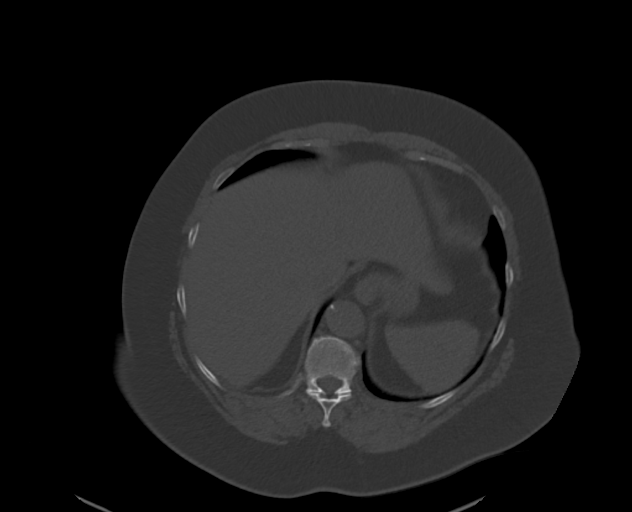
[im 47/52  soft-tissue]
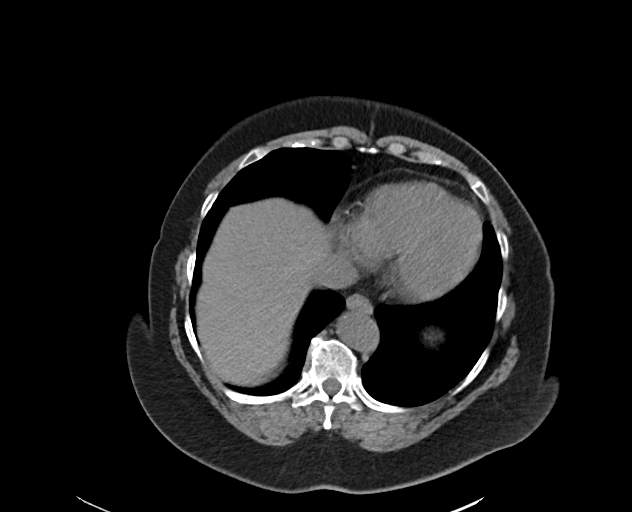

[Series 8: abd without 2.00 br40 s3 cor · coronal · non-contrast · 0.51mm/px · 3 of 202 slices shown]
[im 68/202  soft-tissue]
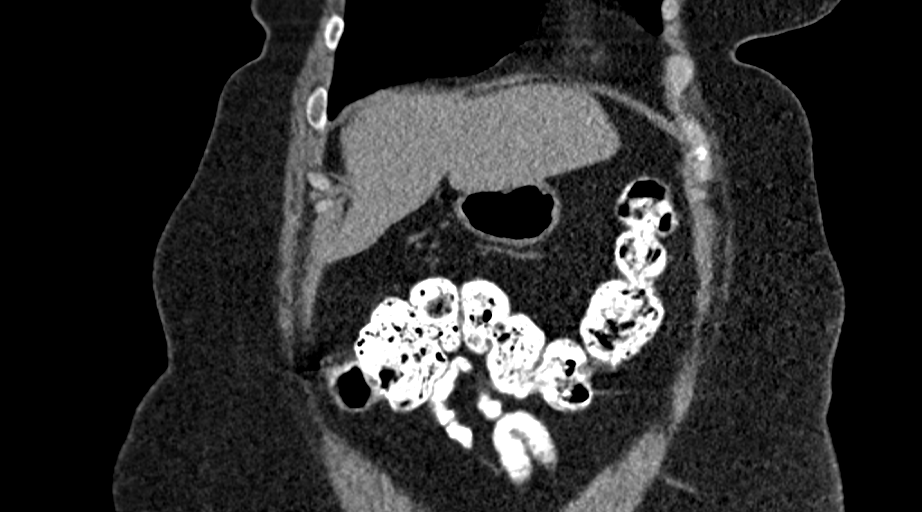
[im 90/202  soft-tissue]
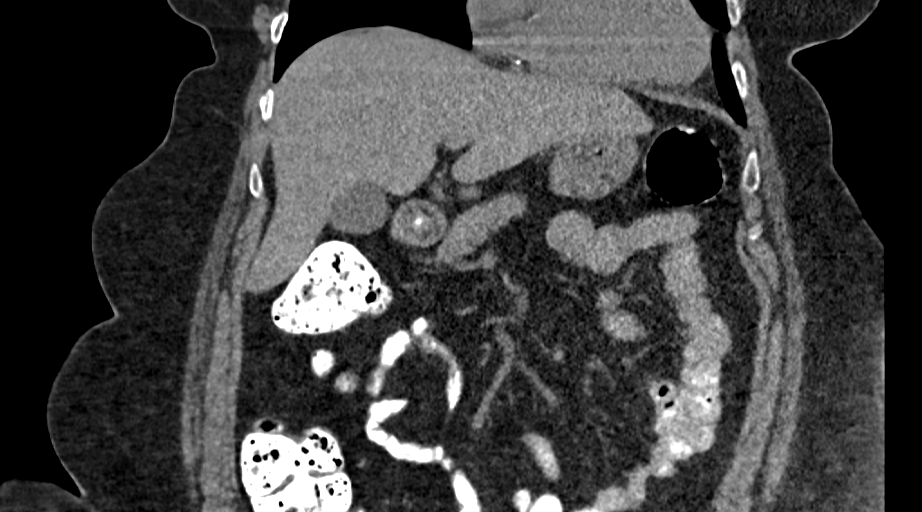
[im 112/202  soft-tissue]
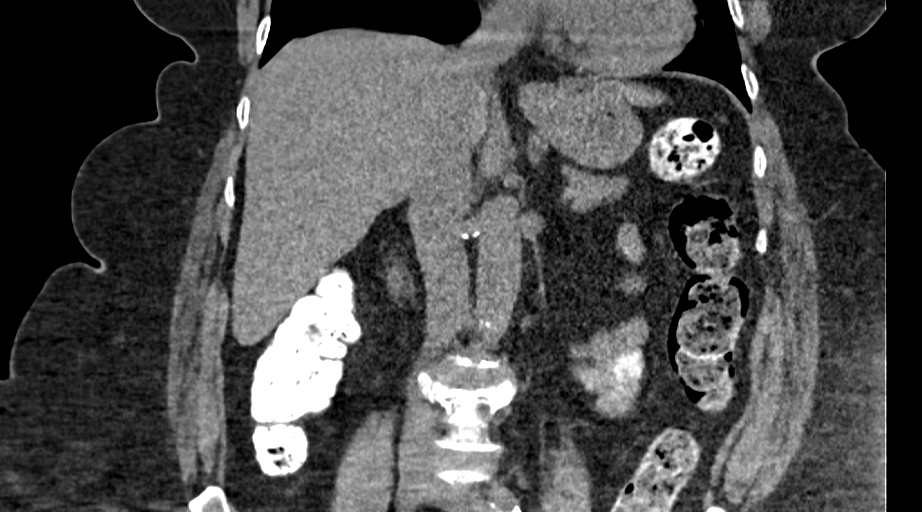

[13 of 46 positions shown; findings below may reference images not displayed]

FINDINGS: Lower chest:  Lung bases are clear.

Hepatobiliary: No focal hepatic lesion.  Gallbladder normal.

Pancreas: Normal pancreatic parenchymal intensity. No ductal
dilatation or inflammation.

Spleen: Normal spleen.

Adrenals/urinary tract: Adrenal glands normal. Exophytic cyst
lateral from the RIGHT mid kidney measures 2.2 cm. The cyst is
predominantly homogeneous low-attenuation around 20 Hounsfield
units.

Small amount gas in the collecting system of the LEFT renal hilum
(image [DATE]) decreased from recent CT (image [DATE])

No nephrolithiasis or ureterolithiasis.

Stomach/Bowel: Stomach and limited of the small bowel is
unremarkable

Vascular/Lymphatic: Abdominal aortic normal caliber. No
retroperitoneal periportal lymphadenopathy.

Musculoskeletal: No aggressive osseous lesion
IMPRESSION: 1. Benign cyst of the RIGHT kidney.
2. Decrease in gas in the LEFT renal collecting system

## 2022-02-25 ENCOUNTER — Encounter: Payer: Self-pay | Admitting: Podiatry

## 2022-02-25 ENCOUNTER — Ambulatory Visit (INDEPENDENT_AMBULATORY_CARE_PROVIDER_SITE_OTHER): Payer: Medicare Other | Admitting: Podiatry

## 2022-02-25 DIAGNOSIS — E119 Type 2 diabetes mellitus without complications: Secondary | ICD-10-CM | POA: Diagnosis not present

## 2022-02-25 DIAGNOSIS — B351 Tinea unguium: Secondary | ICD-10-CM

## 2022-02-25 DIAGNOSIS — M79675 Pain in left toe(s): Secondary | ICD-10-CM

## 2022-02-25 DIAGNOSIS — M79674 Pain in right toe(s): Secondary | ICD-10-CM | POA: Diagnosis not present

## 2022-02-25 DIAGNOSIS — L600 Ingrowing nail: Secondary | ICD-10-CM

## 2022-02-26 ENCOUNTER — Ambulatory Visit: Payer: Medicare Other | Attending: Cardiology | Admitting: Cardiology

## 2022-02-26 ENCOUNTER — Encounter: Payer: Self-pay | Admitting: Cardiology

## 2022-02-26 VITALS — BP 120/80 | HR 60 | Ht 66.0 in | Wt 271.0 lb

## 2022-02-26 DIAGNOSIS — E7800 Pure hypercholesterolemia, unspecified: Secondary | ICD-10-CM | POA: Diagnosis not present

## 2022-02-26 DIAGNOSIS — I2510 Atherosclerotic heart disease of native coronary artery without angina pectoris: Secondary | ICD-10-CM | POA: Diagnosis not present

## 2022-02-26 DIAGNOSIS — Z87891 Personal history of nicotine dependence: Secondary | ICD-10-CM

## 2022-02-26 MED ORDER — NITROGLYCERIN 0.4 MG SL SUBL: 0.4000 mg | SUBLINGUAL_TABLET | SUBLINGUAL | 99 refills | Status: AC | PRN

## 2022-02-26 NOTE — Patient Instructions (Signed)
Medication Instructions:  The current medical regimen is effective;  continue present plan and medications.  *If you need a refill on your cardiac medications before your next appointment, please call your pharmacy*  Follow-Up: At [facility], you and your health needs are our priority.  As part of our continuing mission to provide you with exceptional heart care, we have created designated Provider Care Teams.  These Care Teams include your primary Cardiologist (physician) and Advanced Practice Providers (APPs -  Physician Assistants and Nurse Practitioners) who all work together to provide you with the care you need, when you need it.  We recommend signing up for the patient portal called "MyChart".  Sign up information is provided on this After Visit Summary.  MyChart is used to connect with patients for Virtual Visits (Telemedicine).  Patients are able to view lab/test results, encounter notes, upcoming appointments, etc.  Non-urgent messages can be sent to your provider as well.   To learn more about what you can do with MyChart, go to [email or web address].    Your next appointment:   1 year(s)  The format for your next appointment:   In Person  Provider:   Mark Skains, MD      Important Information About Sugar       

## 2022-02-26 NOTE — Progress Notes (Signed)
Cardiology Office Note:    Date:  02/26/2022   ID:  [name] Cooper, DOB 04-01-1951, MRN [id number]  PCP:  [name], NP  [facility] Cardiologist:  Candee Furbish, MD  [facility] Electrophysiologist:  None   Referring MD: [name], NP    History of Present Illness:    [name] Cooper is a 71 y.o. female here for the follow-up of coronary artery disease post catheterization with 80% LAD lesion that was treated medically since she was not having any significant anginal symptoms.  Atherosclerosis/coronary calcification seen on CT scan and LAD and circumflex distribution as well as mild mid to distal RCA.  Has had shortness of breath related to tobacco use and COPD.  Followed by pulmonary medicine as well.   No early family history of coronary artery disease.  She has quit smoking with nicotine patch.    At the last visit, she was doing well. She endorsed shortness of breath but attributed it to her COPD. She had been doing well with quitting smoking. She was compliant and tolerating her prescriptions. Otherwise, she had no major concerns.  Today, she is feeling overall well and denies any new complaints. She reports being compliant with all her medications.  She has a persistent cough that started a while ago.   She reports that her 10 great-grandchildren helps to keep her busy and active   She denies any palpitations, chest pain, shortness of breath, or peripheral edema. No lightheadedness, headaches, syncope, orthopnea, or PND.  Past Medical History:  Diagnosis Date   Anemia    Arthritis    Cancer ([place])    breast and ovarian   Chicken pox    Chronic lower back pain    COPD (chronic obstructive pulmonary disease) (HCC)    Diabetes (HCC)    GERD (gastroesophageal reflux disease)    Hepatitis 1970's   History of recurrent UTIs    Hyperlipemia    Hypertension    Slow heart rate dx'd 09/24/2014   Urine incontinence     Past Surgical History:  Procedure Laterality  Date   ABDOMINAL HYSTERECTOMY  1984   APPENDECTOMY     BREAST BIOPSY Right 1996   BREAST LUMPECTOMY Right 1996   CYSTOSCOPY WITH URETEROSCOPY AND STENT PLACEMENT Left 09/18/2021   Procedure: CYSTOSCOPY WITH LEFT DIAGNOSTIC URETEROSCOPY, RETROGRADE PYELOGRAM, AND STENT PLACEMENT;  Surgeon: [name], MD;  Location: [facility];  Service: Urology;  Laterality: Left;  13 MINS   LEFT HEART CATH AND CORONARY ANGIOGRAPHY N/A 01/13/2019   Procedure: LEFT HEART CATH AND CORONARY ANGIOGRAPHY;  Surgeon: Nelva Bush, MD;  Location: [facility];  Service: Cardiovascular;  Laterality: N/A;   MASTECTOMY Right 1996   TONSILLECTOMY      Current Medications: Current Meds  Medication Sig   acetaminophen (TYLENOL) 650 MG CR tablet Take 1,300 mg by mouth every 8 (eight) hours as needed for pain.   albuterol (VENTOLIN HFA) 108 (90 Base) MCG/ACT inhaler Inhale 2 puffs into the lungs every 6 (six) hours as needed for wheezing or shortness of breath.   Ascorbic Acid (VITAMIN C) 500 MG CAPS Take 500 mg by mouth daily.   aspirin EC 81 MG tablet Take 1 tablet (81 mg total) by mouth daily.   cholecalciferol (VITAMIN D3) 25 MCG (1000 UNIT) tablet Take 1,000 Units by mouth daily.   ezetimibe (ZETIA) 10 MG tablet Take 1 tablet (10 mg total) by mouth daily.   furosemide (LASIX) 20 MG tablet Take 20 mg  by mouth daily as needed for fluid.   isosorbide mononitrate (IMDUR) 30 MG 24 hr tablet Take 1 tablet by mouth once daily   rosuvastatin (CRESTOR) 20 MG tablet Take 1 tablet by mouth once daily   traMADol (ULTRAM) 50 MG tablet Take 1-2 tablets (50-100 mg total) by mouth every 6 (six) hours as needed for moderate pain.   [DISCONTINUED] nitroGLYCERIN (NITROSTAT) 0.4 MG SL tablet Place 1 tablet (0.4 mg total) under the tongue every 5 (five) minutes as needed for chest pain.     Allergies:   Lisinopril and Codeine   Social History   Socioeconomic History   Marital status: Widowed    Spouse name: Not on  file   Number of children: 1   Years of education: Not on file   Highest education level: Not on file  Occupational History   Occupation: Retired     Fish farm manager: [employer]  Tobacco Use   Smoking status: Every Day    Packs/day: 0.50    Years: 56.00    Total pack years: 28.00    Types: Cigarettes   Smokeless tobacco: Never  Vaping Use   Vaping Use: Never used  Substance and Sexual Activity   Alcohol use: No    Alcohol/week: 0.0 standard drinks of alcohol   Drug use: No    Types: "Crack" cocaine    Comment: last crack was about 8 years ago   Sexual activity: Not on file  Other Topics Concern   Not on file  Social History Narrative   3 grand children    9 great grandchildren    Social Determinants of Health   Financial Resource Strain: Low Risk  (05/09/2020)   Overall Financial Resource Strain (CARDIA)    Difficulty of Paying Living Expenses: Not hard at all  Food Insecurity: No Food Insecurity (05/09/2020)   Hunger Vital Sign    Worried About Running Out of Food in the Last Year: Never true    Ran Out of Food in the Last Year: Never true  Transportation Needs: No Transportation Needs (05/09/2020)   PRAPARE - Hydrologist (Medical): No    Lack of Transportation (Non-Medical): No  Physical Activity: Inactive (05/09/2020)   Exercise Vital Sign    Days of Exercise per Week: 0 days    Minutes of Exercise per Session: 0 min  Stress: No Stress Concern Present (05/09/2020)   Reading    Feeling of Stress : Not at all  Social Connections: Socially Isolated (05/09/2020)   Social Connection and Isolation Panel [NHANES]    Frequency of Communication with Friends and Family: More than three times a week    Frequency of Social Gatherings with Friends and Family: Once a week    Attends Religious Services: Never    Marine scientist or Organizations: No    Attends Theatre manager Meetings: Never    Marital Status: Widowed     Family History: The patient's family history includes Arthritis in her brother; Cancer in her mother; Early death in her mother. There is no history of Colon cancer, Esophageal cancer, Rectal cancer, or Stomach cancer.  ROS:   Please see the history of present illness.    (+) Cough All other systems reviewed and are negative.  EKGs/Labs/Other Studies Reviewed:    The following studies were reviewed today:  Lower Venous DVT 07/30/2021: Summary:  RIGHT:  - No evidence of common  femoral vein obstruction.    LEFT:  - There is no evidence of deep vein thrombosis in the lower extremity.  However, portions of this examination were limited- see technologist  comments above.  - No cystic structure found in the popliteal fossa.    Cath 01/13/19: Significant single vessel coronary artery disease with moderately to severely calcified 80% mid LAD stenosis. Mild, non-obstructive coronary artery disease involving the LCx and RCA. Normal left ventricular systolic function and filling pressure.  Recommendations: Given lack of chest pain (though dyspnea on exertion may be anginal equivalent) and no ongoing antianginal therapy, I recommend trial of isosorbide mononitrate.  If Ms. Eye has persistent symptoms despite optimal medical therapy at follow-up, PCI to mid LAD with orbital atherectomy will need to be considered. Aggressive secondary prevention.   Diagnostic  Dominance: Right  Monitor 10/06/16 PVC rare (23) PAC rare (259) Occasional paroxysmal atrial tachycardia Rare slow successive ventricular beats (4 beats) at 92 BPM Occasional bradycardia when appropriate (6am) Palpitations are related to paroxysmal atrial tachycardia (benign)   EKG: EKG was personally reviewed and interpreted. 02/26/2022: EKG was not ordered. 12/10/2021: Independently reviewed. Sinus bradycardia with rate 56; nonspecific ST changes with NSCSLT   03/03/21: Sinus rhythm, rate 59 bpm; Non-specfic T-Wave changes  Recent Labs: 06/05/2021: ALT 10 12/10/2021: BUN 13; Creatinine, Ser 1.18; Potassium 3.8; Sodium 141 12/29/2021: Hemoglobin 8.5; Platelet Count 254  Recent Lipid Panel    Component Value Date/Time   CHOL 110 06/05/2021 0748   TRIG 73 06/05/2021 0748   HDL 56 06/05/2021 0748   CHOLHDL 2.0 06/05/2021 0748   CHOLHDL 3 07/08/2020 1422   VLDL 20.2 07/08/2020 1422   LDLCALC 39 06/05/2021 0748   LDLDIRECT 159.0 06/07/2018 1446     Risk Assessment/Calculations:       Physical Exam:    VS:  BP 120/80 (BP Location: Left Arm, Patient Position: Sitting, Cuff Size: Normal)   Pulse 60   Ht '5\' 6"'$  (1.676 m)   Wt 271 lb (122.9 kg)   LMP  (LMP Unknown)   SpO2 95%   BMI 43.74 kg/m     Wt Readings from Last 3 Encounters:  02/26/22 271 lb (122.9 kg)  01/16/22 262 lb (118.8 kg)  01/09/22 260 lb 9.6 oz (118.2 kg)     GEN:  Well nourished, well developed in no acute distress HEENT: Normal NECK: No JVD; No carotid bruits LYMPHATICS: No lymphadenopathy CARDIAC: RRR, no murmurs, rubs, gallops RESPIRATORY:  Clear to auscultation without rales, wheezing or rhonchi  ABDOMEN: Soft, non-tender, non-distended MUSCULOSKELETAL:  No edema; No deformity  SKIN: Warm and dry NEUROLOGIC:  Alert and oriented x 3 PSYCHIATRIC:  Normal affect   ASSESSMENT:    1. Atherosclerosis of native coronary artery of native heart without angina pectoris   2. Pure hypercholesterolemia   3. Morbid obesity ([place])   4. Former smoker      PLAN:      Coronary artery disease involving native coronary artery of native heart with angina pectoris ([place]) Previously densely calcified LAD lesion 80%.  Stent placed during catheterization in 2020.  On antianginal isosorbide.  Doing well.  No anginal symptoms.  She was treated medically.  Ischemia trial.  I would like to see her LDL lower however.  I previously added Zetia 10 mg to her Crestor 20.  Continue  with aspirin 81 mg.   Pure hypercholesterolemia On Crestor 20 mg as well as Zetia 10 mg.  Current LDL in the 39.  Goal less  than 70.  Excellent  Emphysema of lung (HCC) No active wheezing today.  She quit smoking a while back.  I asked her how she did this and she stated that she just stopped buying them.  Excellent.   Diabetes mellitus without complication (HCC) Hemoglobin A1c 6.5.  Linagliptin 5 mg a day.  Per primary provider.  Excellent.   Former smoker Excellent, stopped in October 2020.   Morbid obesity ([place]) Continue to encourage weight loss, decrease carbohydrates.  No changes.  Anemia Being followed by Dr. Julien Nordmann.  Required hospitalization for transfusion in the past.  Follow-up: 1 year   Medication Adjustments/Labs and Tests Ordered: Current medicines are reviewed at length with the patient today.  Concerns regarding medicines are outlined above.  No orders of the defined types were placed in this encounter.  Meds ordered this encounter  Medications   nitroGLYCERIN (NITROSTAT) 0.4 MG SL tablet    Sig: Place 1 tablet (0.4 mg total) under the tongue every 5 (five) minutes as needed for chest pain.    Dispense:  25 tablet    Refill:  prn   Patient Instructions  Medication Instructions:  The current medical regimen is effective;  continue present plan and medications.  *If you need a refill on your cardiac medications before your next appointment, please call your pharmacy*  Follow-Up: At [facility], you and your health needs are our priority.  As part of our continuing mission to provide you with exceptional heart care, we have created designated Provider Care Teams.  These Care Teams include your primary Cardiologist (physician) and Advanced Practice Providers (APPs -  Physician Assistants and Nurse Practitioners) who all work together to provide you with the care you need, when you need it.  We recommend signing up for the patient portal called  "MyChart".  Sign up information is provided on this After Visit Summary.  MyChart is used to connect with patients for Virtual Visits (Telemedicine).  Patients are able to view lab/test results, encounter notes, upcoming appointments, etc.  Non-urgent messages can be sent to your provider as well.   To learn more about what you can do with MyChart, go to [email or web address].    Your next appointment:   1 year(s)  The format for your next appointment:   In Person  Provider:   Candee Furbish, MD      Important Information About Sugar          I, Candee Furbish, MD, have reviewed all documentation for this visit. The documentation on 02/26/22 for the exam, diagnosis, procedures, and orders are all accurate and complete.   ICandee Furbish, MD, have reviewed all documentation for this visit. The documentation on 02/26/22 for the exam, diagnosis, procedures, and orders are all accurate and complete.   Signed, Candee Furbish, MD  02/26/2022 2:47 PM    [place]

## 2022-03-02 NOTE — Progress Notes (Signed)
  Subjective:  Patient ID: [name] Cooper, female    DOB: August 11, 1950,  MRN: [id number]  [name] Cooper presents to clinic today for painful elongated mycotic toenails 1-5 bilaterally which are tender when wearing enclosed shoe gear. Pain is relieved with periodic professional debridement.  Chief Complaint  Patient presents with   Nail Problem    Nail Trim Not Diabetic  PCP - [name]    New problem(s): ingrown toenail to the right great toe is tender to touch. Relates no redness, drainage or swelling.   PCP is [name], NP.  Allergies  Allergen Reactions   Lisinopril Anaphylaxis   Codeine     REACTION: MAKES HER SLEEPY    Review of Systems: Negative except as noted in the HPI.  Objective: No changes noted in today's physical examination.  [name] Cooper is a pleasant 71 y.o. female morbidly obese in NAD. AAO x 3. Vascular Examination: CFT immediate b/l LE. Palpable DP/PT pulses b/l LE. Digital hair sparse b/l. Skin temperature gradient WNL b/l. No pain with calf compression b/l. No edema noted b/l. No cyanosis or clubbing noted b/l LE.  Dermatological Examination: Pedal integument with normal turgor, texture and tone b/l LE. No open wounds b/l. No interdigital macerations b/l. Toenails 2-5 b/l  elongated, thickened, discolored with subungual debris. +Tenderness with dorsal palpation of nailplates. No hyperkeratotic or porokeratotic lesions present.   Incurvated nailplate right great toe both border(s) with tenderness to palpation. No erythema, no edema, no drainage noted.   Left great toe with evidence of total nail avulsion with regrowth of mycotic nail plate.  Musculoskeletal Examination: Normal muscle strength 5/5 to all lower extremity muscle groups bilaterally. No pain, crepitus or joint limitation noted with ROM b/l LE. No gross bony pedal deformities b/l. Patient ambulates independently without assistive aids.  Neurological Examination: Protective sensation  intact 5/5 intact bilaterally with 10g monofilament b/l. Vibratory sensation intact b/l.  Assessment/Plan: 1. Pain due to onychomycosis of toenails of both feet   2. Ingrown toenail without infection     No orders of the defined types were placed in this encounter.   -Consent given for treatment as described below: -Examined patient. -Continue supportive shoe gear daily. -Toenails 2-5 bilaterally and L hallux debrided in length and girth without iatrogenic bleeding with sterile nail nipper and dremel.  -Discussed chronicity of ingrown toenail(s) of right great toe. Recommended patient consider having matrixectomy performed to alleviate chronic ingrown toenail(s). Discussed in-office procedure and post-procedure instructions. Patient states they will think about it. -No invasive procedure(s) performed. Offending nail border debrided and curretaged right great toe utilizing sterile nail nipper and currette. Border(s) cleansed with alcohol and triple antibiotic ointment applied. Patient/POA/Caregiver/Facility instructed to apply Neosporin Cream  to R hallux once daily for 7 days. Call office if there are any concerns. -Patient/POA to call should there be question/concern in the interim.   Return in about 3 months (around 05/28/2022).  Marzetta Board, DPM

## 2022-03-10 ENCOUNTER — Telehealth: Payer: Self-pay | Admitting: Internal Medicine

## 2022-03-10 ENCOUNTER — Other Ambulatory Visit: Payer: Self-pay | Admitting: Cardiology

## 2022-03-10 NOTE — Telephone Encounter (Signed)
Called patient regarding December appointment, patient has been called and notified. 

## 2022-03-30 ENCOUNTER — Other Ambulatory Visit: Payer: Self-pay

## 2022-03-30 ENCOUNTER — Inpatient Hospital Stay (HOSPITAL_BASED_OUTPATIENT_CLINIC_OR_DEPARTMENT_OTHER): Payer: Medicare Other | Admitting: Internal Medicine

## 2022-03-30 ENCOUNTER — Inpatient Hospital Stay: Payer: Medicare Other | Attending: Internal Medicine

## 2022-03-30 VITALS — BP 153/67 | HR 54 | Temp 98.6°F | Resp 18 | Wt 279.2 lb

## 2022-03-30 DIAGNOSIS — D509 Iron deficiency anemia, unspecified: Secondary | ICD-10-CM | POA: Insufficient documentation

## 2022-03-30 DIAGNOSIS — K5900 Constipation, unspecified: Secondary | ICD-10-CM | POA: Diagnosis not present

## 2022-03-30 LAB — CBC WITH DIFFERENTIAL (CANCER CENTER ONLY)
Abs Immature Granulocytes: 0.02 10*3/uL (ref 0.00–0.07)
Basophils Absolute: 0.1 10*3/uL (ref 0.0–0.1)
Basophils Relative: 1 %
Eosinophils Absolute: 0.7 10*3/uL — ABNORMAL HIGH (ref 0.0–0.5)
Eosinophils Relative: 8 %
HCT: 30.2 % — ABNORMAL LOW (ref 36.0–46.0)
Hemoglobin: 8.7 g/dL — ABNORMAL LOW (ref 12.0–15.0)
Immature Granulocytes: 0 %
Lymphocytes Relative: 31 %
Lymphs Abs: 2.7 10*3/uL (ref 0.7–4.0)
MCH: 19.8 pg — ABNORMAL LOW (ref 26.0–34.0)
MCHC: 28.8 g/dL — ABNORMAL LOW (ref 30.0–36.0)
MCV: 68.6 fL — ABNORMAL LOW (ref 80.0–100.0)
Monocytes Absolute: 0.9 10*3/uL (ref 0.1–1.0)
Monocytes Relative: 10 %
Neutro Abs: 4.4 10*3/uL (ref 1.7–7.7)
Neutrophils Relative %: 50 %
Platelet Count: 224 10*3/uL (ref 150–400)
RBC: 4.4 MIL/uL (ref 3.87–5.11)
RDW: 20.6 % — ABNORMAL HIGH (ref 11.5–15.5)
WBC Count: 8.7 10*3/uL (ref 4.0–10.5)
nRBC: 0.2 % (ref 0.0–0.2)

## 2022-03-30 LAB — IRON AND IRON BINDING CAPACITY (CC-WL,HP ONLY)
Iron: 8 ug/dL — ABNORMAL LOW (ref 28–170)
Saturation Ratios: 2 % — ABNORMAL LOW (ref 10.4–31.8)
TIBC: 365 ug/dL (ref 250–450)
UIBC: 357 ug/dL (ref 148–442)

## 2022-03-30 LAB — SAMPLE TO BLOOD BANK

## 2022-03-30 LAB — FERRITIN: Ferritin: 20 ng/mL (ref 11–307)

## 2022-03-30 NOTE — Progress Notes (Signed)
[place] [phone]   [phone]  OFFICE PROGRESS NOTE  [name], NP [address] [place] [address]  DIAGNOSIS: Iron deficiency anemia Bone marrow biopsy and aspirate in April 2023 was nonspecific but could not exclude early MDS.   PRIOR THERAPY: None   CURRENT THERAPY:  1) IV iron PRN with venofer 300 mg weekly for 3 weeks at a time.   2) Oral iron supplement with integra plus p.o. daily.    INTERVAL HISTORY: [name] 71 y.o. female returns to the clinic today for follow-up visit accompanied by her husband.  The patient is feeling fine except for mild cough and shortness of breath at baseline increased with exertion secondary to COPD.  She denied having any fatigue or weakness.  She has no chest pain, or hemoptysis.  She has no nausea, vomiting, diarrhea or constipation.  She has no headache or visual changes.  She continues to tolerate the oral iron supplements fairly well.  She is here today for evaluation and repeat CBC, iron study and ferritin.   MEDICAL HISTORY: Past Medical History:  Diagnosis Date   Anemia    Arthritis    Cancer ([place])    breast and ovarian   Chicken pox    Chronic lower back pain    COPD (chronic obstructive pulmonary disease) (HCC)    Diabetes (HCC)    GERD (gastroesophageal reflux disease)    Hepatitis 1970's   History of recurrent UTIs    Hyperlipemia    Hypertension    Slow heart rate dx'd 09/24/2014   Urine incontinence     ALLERGIES:  is allergic to lisinopril and codeine.  MEDICATIONS:  Current Outpatient Medications  Medication Sig Dispense Refill   acetaminophen (TYLENOL) 650 MG CR tablet Take 1,300 mg by mouth every 8 (eight) hours as needed for pain.     albuterol (VENTOLIN HFA) 108 (90 Base) MCG/ACT inhaler Inhale 2 puffs into the lungs every 6 (six) hours as needed for wheezing or shortness of breath. 1 each 3   Ascorbic Acid (VITAMIN C) 500 MG CAPS Take 500 mg by mouth  daily.     aspirin EC 81 MG tablet Take 1 tablet (81 mg total) by mouth daily. 90 tablet 3   cholecalciferol (VITAMIN D3) 25 MCG (1000 UNIT) tablet Take 1,000 Units by mouth daily.     ezetimibe (ZETIA) 10 MG tablet Take 1 tablet (10 mg total) by mouth daily. 90 tablet 3   furosemide (LASIX) 20 MG tablet Take 20 mg by mouth daily as needed for fluid.     isosorbide mononitrate (IMDUR) 30 MG 24 hr tablet Take 1 tablet by mouth once daily 90 tablet 3   nitroGLYCERIN (NITROSTAT) 0.4 MG SL tablet Place 1 tablet (0.4 mg total) under the tongue every 5 (five) minutes as needed for chest pain. 25 tablet prn   rosuvastatin (CRESTOR) 20 MG tablet Take 1 tablet by mouth once daily 21 tablet 1   traMADol (ULTRAM) 50 MG tablet Take 1-2 tablets (50-100 mg total) by mouth every 6 (six) hours as needed for moderate pain. 15 tablet 0   No current facility-administered medications for this visit.    SURGICAL HISTORY:  Past Surgical History:  Procedure Laterality Date   ABDOMINAL HYSTERECTOMY  1984   APPENDECTOMY     BREAST BIOPSY Right 1996   BREAST LUMPECTOMY Right 1996   CYSTOSCOPY WITH URETEROSCOPY AND STENT PLACEMENT Left 09/18/2021   Procedure:  CYSTOSCOPY WITH LEFT DIAGNOSTIC URETEROSCOPY, RETROGRADE PYELOGRAM, AND STENT PLACEMENT;  Surgeon: [name], MD;  Location: [facility];  Service: Urology;  Laterality: Left;  50 MINS   LEFT HEART CATH AND CORONARY ANGIOGRAPHY N/A 01/13/2019   Procedure: LEFT HEART CATH AND CORONARY ANGIOGRAPHY;  Surgeon: Nelva Bush, MD;  Location: [facility];  Service: Cardiovascular;  Laterality: N/A;   MASTECTOMY Right 1996   TONSILLECTOMY      REVIEW OF SYSTEMS:  A comprehensive review of systems was negative except for: Respiratory: positive for dyspnea on exertion   PHYSICAL EXAMINATION: General appearance: alert, cooperative, fatigued, and no distress Head: Normocephalic, without obvious abnormality, atraumatic Neck: no adenopathy, no JVD, supple,  symmetrical, trachea midline, and thyroid not enlarged, symmetric, no tenderness/mass/nodules Lymph nodes: Cervical, supraclavicular, and axillary nodes normal. Resp: clear to auscultation bilaterally Back: symmetric, no curvature. ROM normal. No CVA tenderness. Cardio: regular rate and rhythm, S1, S2 normal, no murmur, click, rub or gallop GI: soft, non-tender; bowel sounds normal; no masses,  no organomegaly Extremities: extremities normal, atraumatic, no cyanosis or edema  ECOG PERFORMANCE STATUS: 1 - Symptomatic but completely ambulatory  Blood pressure (!) 153/67, pulse (!) 54, temperature 98.6 F (37 C), temperature source Oral, resp. rate 18, weight 279 lb 4 oz (126.7 kg), SpO2 98 %.  LABORATORY DATA: Lab Results  Component Value Date   WBC 8.7 03/30/2022   HGB 8.7 (L) 03/30/2022   HCT 30.2 (L) 03/30/2022   MCV 68.6 (L) 03/30/2022   PLT 224 03/30/2022      Chemistry      Component Value Date/Time   NA 141 12/10/2021 0312   NA 140 01/30/2019 1330   K 3.8 12/10/2021 0312   CL 111 12/10/2021 0312   CO2 22 12/10/2021 0312   BUN 13 12/10/2021 0312   BUN 18 01/30/2019 1330   CREATININE 1.18 (H) 12/10/2021 0312   CREATININE 1.20 (H) 12/21/2019 1336   CREATININE 1.28 (H) 09/21/2016 1115      Component Value Date/Time   CALCIUM 8.6 (L) 12/10/2021 0312   ALKPHOS 88 07/08/2020 1422   AST 13 07/08/2020 1422   AST 12 (L) 12/21/2019 1336   ALT 10 06/05/2021 0748   ALT 8 12/21/2019 1336   BILITOT 0.4 07/08/2020 1422   BILITOT 0.4 12/21/2019 1336       RADIOGRAPHIC STUDIES: No results found.  ASSESSMENT AND PLAN: This is a very pleasant 71 years old African-American female with iron deficiency anemia status post Venofer infusion in October 2021.   The patient is status post iron infusion with Venofer completed last year and tolerated it fairly well.  She has been on oral iron tablet with Integra +1 capsule p.o. daily. The patient was treated with iron infusion with  Venofer several times recently even at a higher dose of 400 mg but no significant improvement in her anemia.   She had a recent bone marrow biopsy and aspirate that showed no specific findings unlikely to be reactive in nature but early MDS could not be excluded. She had extensive blood work in the past that was unremarkable to explain the underlying etiology of her microcytic anemia except for the possibility of gastrointestinal blood loss but she had colonoscopy and upper endoscopy in July 2021 with removal of few polyps.  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 The patient is currently on oral iron tablet with Integra +1 capsule p.o. daily.  She has been tolerating this treatment well except  for intermittent constipation. Repeat CBC today showed slight improvement of her hemoglobin up to 8.7.  Iron study and ferritin are still pending. Recommended for the patient to continue with the oral iron tablets for now.  I will see her back for follow-up visit in 3 months for evaluation and repeat blood work. She was advised to call immediately if she has any other concerning symptoms in the interval. The patient voices understanding of current disease status and treatment options and is in agreement with the current care plan.  All questions were answered. The patient knows to call the clinic with any problems, questions or concerns. We can certainly see the patient much sooner if necessary.  Disclaimer: This note was dictated with voice recognition software. Similar sounding words can inadvertently be transcribed and may not be corrected upon review.

## 2022-03-31 ENCOUNTER — Telehealth: Payer: Self-pay | Admitting: Medical Oncology

## 2022-03-31 ENCOUNTER — Other Ambulatory Visit: Payer: Self-pay | Admitting: Internal Medicine

## 2022-03-31 NOTE — Telephone Encounter (Signed)
Pt.notified

## 2022-03-31 NOTE — Telephone Encounter (Signed)
-----   Message from Curt Bears, MD sent at 03/31/2022  8:15 AM EST ----- Her serum iron came back low.  I ordered Venofer infusion at the [facility] to start this Friday.  Please let the patient knows.  Sometimes she is reluctant to do iron infusion but this is important.  Thank you ----- Message ----- From: Buel Ream, [facility] [place] Sent: 03/30/2022   1:31 PM EST To: Curt Bears, MD

## 2022-04-01 ENCOUNTER — Other Ambulatory Visit: Payer: Self-pay

## 2022-04-01 ENCOUNTER — Telehealth: Payer: Self-pay | Admitting: Pharmacy Technician

## 2022-04-01 LAB — HEAVY METALS, BLOOD
Arsenic: 2 ug/L (ref 0–9)
Lead: 1 ug/dL (ref 0.0–3.4)
Mercury: <1 ug/L (ref 0.0–14.9)

## 2022-04-01 NOTE — Telephone Encounter (Signed)
Auth Submission: NO AUTH NEEDED Payer: [employer] MEDICARE Medication & CPT/J Code(s) submitted: Venofer (Iron Sucrose) J1756 Route of submission (phone, fax, portal):  Phone # Fax # Auth type: Buy/Bill Units/visits requested: X3 Reference number:  Approval from: 04/01/22 to 04/02/23

## 2022-04-02 ENCOUNTER — Ambulatory Visit: Payer: Medicare Other

## 2022-04-03 ENCOUNTER — Other Ambulatory Visit: Payer: Self-pay

## 2022-04-07 ENCOUNTER — Ambulatory Visit (INDEPENDENT_AMBULATORY_CARE_PROVIDER_SITE_OTHER): Payer: Medicare Other

## 2022-04-07 VITALS — BP 162/71 | HR 48 | Temp 98.1°F | Resp 16 | Ht 66.0 in | Wt 281.4 lb

## 2022-04-07 DIAGNOSIS — D509 Iron deficiency anemia, unspecified: Secondary | ICD-10-CM | POA: Diagnosis not present

## 2022-04-07 DIAGNOSIS — D500 Iron deficiency anemia secondary to blood loss (chronic): Secondary | ICD-10-CM

## 2022-04-07 MED ORDER — SODIUM CHLORIDE 0.9 % IV SOLN
500.0000 mg | INTRAVENOUS | Status: DC
  Administered 2022-04-07: 500 mg via INTRAVENOUS
  Filled 2022-04-07: qty 25

## 2022-04-07 MED ORDER — DIPHENHYDRAMINE HCL 25 MG PO CAPS
25.0000 mg | ORAL_CAPSULE | Freq: Once | ORAL | Status: AC
  Administered 2022-04-07: 25 mg via ORAL
  Filled 2022-04-07: qty 1

## 2022-04-07 MED ORDER — ACETAMINOPHEN 325 MG PO TABS
650.0000 mg | ORAL_TABLET | Freq: Once | ORAL | Status: AC
  Administered 2022-04-07: 650 mg via ORAL
  Filled 2022-04-07: qty 2

## 2022-04-07 NOTE — Progress Notes (Signed)
Diagnosis: Infusion  Provider:  Marshell Garfinkel MD  Procedure: Infusion  IV Type: Peripheral, IV Location: left wrist  Venofer (Iron Sucrose), Dose: 500 mg  Infusion Start Time: 5501  Infusion Stop Time: 5868  Post Infusion IV Care: Peripheral IV Discontinued  Discharge: Condition: Good, Destination: Home . AVS provided to patient.   Performed by:  Adelina Mings, LPN

## 2022-04-15 ENCOUNTER — Ambulatory Visit (INDEPENDENT_AMBULATORY_CARE_PROVIDER_SITE_OTHER): Payer: Medicare Other

## 2022-04-15 VITALS — BP 180/84 | HR 52 | Temp 97.9°F | Resp 20 | Ht 66.0 in | Wt 274.6 lb

## 2022-04-15 DIAGNOSIS — D500 Iron deficiency anemia secondary to blood loss (chronic): Secondary | ICD-10-CM

## 2022-04-15 DIAGNOSIS — D509 Iron deficiency anemia, unspecified: Secondary | ICD-10-CM | POA: Diagnosis not present

## 2022-04-15 MED ORDER — SODIUM CHLORIDE 0.9 % IV SOLN
500.0000 mg | INTRAVENOUS | Status: DC
  Administered 2022-04-15: 500 mg via INTRAVENOUS
  Filled 2022-04-15: qty 25

## 2022-04-15 MED ORDER — ACETAMINOPHEN 325 MG PO TABS
650.0000 mg | ORAL_TABLET | Freq: Once | ORAL | Status: AC
  Administered 2022-04-15: 650 mg via ORAL
  Filled 2022-04-15: qty 2

## 2022-04-15 MED ORDER — DIPHENHYDRAMINE HCL 25 MG PO CAPS
25.0000 mg | ORAL_CAPSULE | Freq: Once | ORAL | Status: AC
  Administered 2022-04-15: 25 mg via ORAL
  Filled 2022-04-15: qty 1

## 2022-04-15 NOTE — Progress Notes (Signed)
Diagnosis: Iron Deficiency Anemia  Provider:  Marshell Garfinkel MD  Procedure: Infusion  IV Type: Peripheral, IV Location: L wrist  Venofer (Iron Sucrose), Dose: 500 mg  Infusion Start Time: 0939  Infusion Stop Time: 7943  Post Infusion IV Care: Patient declined observation and Peripheral IV Discontinued  Discharge: Condition: Good, Destination: Home . AVS provided to patient.   Communicated to patient to recheck BP at home and contact her PCP if it remains elevated. Patient verbalized understanding.  Performed by:  Binnie Kand, RN

## 2022-04-22 ENCOUNTER — Ambulatory Visit: Payer: Medicare Other

## 2022-04-24 ENCOUNTER — Ambulatory Visit (INDEPENDENT_AMBULATORY_CARE_PROVIDER_SITE_OTHER): Payer: 59

## 2022-04-24 ENCOUNTER — Encounter: Payer: Self-pay | Admitting: Internal Medicine

## 2022-04-24 VITALS — BP 151/83 | HR 51 | Temp 97.7°F | Resp 20 | Ht 66.0 in | Wt 274.2 lb

## 2022-04-24 DIAGNOSIS — D500 Iron deficiency anemia secondary to blood loss (chronic): Secondary | ICD-10-CM

## 2022-04-24 DIAGNOSIS — D509 Iron deficiency anemia, unspecified: Secondary | ICD-10-CM | POA: Diagnosis not present

## 2022-04-24 MED ORDER — SODIUM CHLORIDE 0.9 % IV SOLN
500.0000 mg | INTRAVENOUS | Status: DC
  Administered 2022-04-24: 500 mg via INTRAVENOUS
  Filled 2022-04-24: qty 25

## 2022-04-24 MED ORDER — ACETAMINOPHEN 325 MG PO TABS
650.0000 mg | ORAL_TABLET | Freq: Once | ORAL | Status: AC
  Administered 2022-04-24: 650 mg via ORAL
  Filled 2022-04-24: qty 2

## 2022-04-24 MED ORDER — DIPHENHYDRAMINE HCL 25 MG PO CAPS
25.0000 mg | ORAL_CAPSULE | Freq: Once | ORAL | Status: AC
  Administered 2022-04-24: 25 mg via ORAL
  Filled 2022-04-24: qty 1

## 2022-04-24 NOTE — Progress Notes (Signed)
Diagnosis: Iron Deficiency Anemia  Provider:  Marshell Garfinkel MD  Procedure: Infusion  IV Type: Peripheral, IV Location: L Forearm  Venofer (Iron Sucrose), 500 mg  Infusion Start Time: 2883  Infusion Stop Time: 3744  Post Infusion IV Care: Peripheral IV Discontinued  Discharge: Condition: Good, Destination: Home . AVS provided to patient.   Performed by:  Cleophus Molt, RN

## 2022-05-24 ENCOUNTER — Encounter: Payer: Self-pay | Admitting: Internal Medicine

## 2022-06-19 ENCOUNTER — Ambulatory Visit (INDEPENDENT_AMBULATORY_CARE_PROVIDER_SITE_OTHER): Payer: 59 | Admitting: Podiatry

## 2022-06-19 DIAGNOSIS — M79675 Pain in left toe(s): Secondary | ICD-10-CM | POA: Diagnosis not present

## 2022-06-19 DIAGNOSIS — M2141 Flat foot [pes planus] (acquired), right foot: Secondary | ICD-10-CM

## 2022-06-19 DIAGNOSIS — M2142 Flat foot [pes planus] (acquired), left foot: Secondary | ICD-10-CM

## 2022-06-19 DIAGNOSIS — B351 Tinea unguium: Secondary | ICD-10-CM

## 2022-06-19 DIAGNOSIS — E119 Type 2 diabetes mellitus without complications: Secondary | ICD-10-CM | POA: Diagnosis not present

## 2022-06-19 DIAGNOSIS — M79674 Pain in right toe(s): Secondary | ICD-10-CM

## 2022-06-19 NOTE — Progress Notes (Unsigned)
ANNUAL DIABETIC FOOT EXAM  Subjective: [name] Cooper presents today {jgcomplaint:23593}.  Chief Complaint  Patient presents with   Nail Problem    [facility] PCP-[facility] PCP VST-05/24/2022   Patient confirms h/o diabetes.  Patient relates {Numbers; 0-100:15068} year h/o diabetes.  Patient denies any h/o foot wounds.  Patient has h/o foot ulcer of {jgPodToeLocator:23637}, which healed via help of ***.  Patient has h/o amputation(s): {jgamp:23617}.  Patient endorses symptoms of foot numbness.   Patient endorses symptoms of foot tingling.  Patient endorses symptoms of burning in feet.  Patient endorses symptoms of pins/needles sensation in feet.  Patient denies any numbness, tingling, burning, or pins/needle sensation in feet.  Patient has been diagnosed with neuropathy and it is managed with {JGNEUROPATHYMEDS:27053}.  Risk factors: {jgriskfactors:24044}.  [name], NP is patient's PCP. Last visit was {Time; dates multiple:15870}***.  Past Medical History:  Diagnosis Date   Anemia    Arthritis    Cancer ([place])    breast and ovarian   Chicken pox    Chronic lower back pain    COPD (chronic obstructive pulmonary disease) (HCC)    Diabetes (HCC)    GERD (gastroesophageal reflux disease)    Hepatitis 1970's   History of recurrent UTIs    Hyperlipemia    Hypertension    Slow heart rate dx'd 09/24/2014   Urine incontinence    Patient Active Problem List   Diagnosis Date Noted   Symptomatic anemia 12/09/2021   Coronary artery disease involving native coronary artery of native heart with angina pectoris ([place]) 03/03/2021   Localized swelling of forearm 12/11/2020   CKD (chronic kidney disease) stage 3, GFR 30-59 ml/min (HCC) 10/11/2019   Heme positive stool 09/14/2019   Iron deficiency anemia 07/31/2019   Diabetes mellitus without complication ([place]) [id number]   Dyspnea on exertion 01/13/2019   Morbid obesity ([place]) 10/12/2018   Former smoker 10/12/2018   Cough  10/11/2018   Microcytic anemia 06/15/2018   History of right mastectomy 06/15/2018   Vitamin D deficiency 06/15/2018   Emphysema of lung ([place]) 06/15/2018   Tobacco dependence 06/15/2018   Urine, incontinence, stress female 06/15/2018   Estrogen deficiency 03/30/2016   Rhinitis, allergic    HTN (hypertension) 09/24/2014   Pure hypercholesterolemia 09/24/2014   OA (osteoarthritis) 09/24/2014   History of solitary pulmonary nodule 03/21/2008   History of right breast cancer 03/21/2008   Past Surgical History:  Procedure Laterality Date   ABDOMINAL HYSTERECTOMY  1984   APPENDECTOMY     BREAST BIOPSY Right 1996   BREAST LUMPECTOMY Right 1996   CYSTOSCOPY WITH URETEROSCOPY AND STENT PLACEMENT Left 09/18/2021   Procedure: CYSTOSCOPY WITH LEFT DIAGNOSTIC URETEROSCOPY, RETROGRADE PYELOGRAM, AND STENT PLACEMENT;  Surgeon: [name], MD;  Location: [facility];  Service: Urology;  Laterality: Left;  29 MINS   LEFT HEART CATH AND CORONARY ANGIOGRAPHY N/A 01/13/2019   Procedure: LEFT HEART CATH AND CORONARY ANGIOGRAPHY;  Surgeon: Nelva Bush, MD;  Location: [facility];  Service: Cardiovascular;  Laterality: N/A;   MASTECTOMY Right 1996   TONSILLECTOMY     Current Outpatient Medications on File Prior to Visit  Medication Sig Dispense Refill   HYDROcodone-acetaminophen (NORCO/VICODIN) 5-325 MG tablet Take 1 tablet by mouth 2 (two) times daily as needed.     acetaminophen (TYLENOL) 650 MG CR tablet Take 1,300 mg by mouth every 8 (eight) hours as needed for pain.     albuterol (VENTOLIN HFA) 108 (90 Base) MCG/ACT inhaler Inhale 2 puffs into the lungs  every 6 (six) hours as needed for wheezing or shortness of breath. 1 each 3   Ascorbic Acid (VITAMIN C) 500 MG CAPS Take 500 mg by mouth daily.     aspirin EC 81 MG tablet Take 1 tablet (81 mg total) by mouth daily. 90 tablet 3   cholecalciferol (VITAMIN D3) 25 MCG (1000 UNIT) tablet Take 1,000 Units by mouth daily.     ezetimibe  (ZETIA) 10 MG tablet Take 1 tablet (10 mg total) by mouth daily. 90 tablet 3   furosemide (LASIX) 20 MG tablet Take 20 mg by mouth daily as needed for fluid.     isosorbide mononitrate (IMDUR) 30 MG 24 hr tablet Take 1 tablet by mouth once daily 90 tablet 3   nitroGLYCERIN (NITROSTAT) 0.4 MG SL tablet Place 1 tablet (0.4 mg total) under the tongue every 5 (five) minutes as needed for chest pain. 25 tablet prn   rosuvastatin (CRESTOR) 20 MG tablet Take 1 tablet by mouth once daily 21 tablet 1   traMADol (ULTRAM) 50 MG tablet Take 1-2 tablets (50-100 mg total) by mouth every 6 (six) hours as needed for moderate pain. 15 tablet 0   No current facility-administered medications on file prior to visit.    Allergies  Allergen Reactions   Lisinopril Anaphylaxis   Codeine     REACTION: MAKES HER SLEEPY   Social History   Occupational History   Occupation: Retired     Fish farm manager: [employer]  Tobacco Use   Smoking status: Every Day    Packs/day: 0.50    Years: 56.00    Total pack years: 28.00    Types: Cigarettes   Smokeless tobacco: Never  Vaping Use   Vaping Use: Never used  Substance and Sexual Activity   Alcohol use: No    Alcohol/week: 0.0 standard drinks of alcohol   Drug use: No    Types: "Crack" cocaine    Comment: last crack was about 8 years ago   Sexual activity: Not on file   Family History  Problem Relation Age of Onset   Cancer Mother    Early death Mother    Arthritis Brother    Colon cancer Neg Hx    Esophageal cancer Neg Hx    Rectal cancer Neg Hx    Stomach cancer Neg Hx    Immunization History  Administered Date(s) Administered   Fluad Quad(high Dose 65+) 02/06/2019   Influenza,inj,Quad PF,6+ Mos 03/17/2016, 12/22/2016, 12/22/2017   Influenza-Unspecified 03/18/2020   PFIZER(Purple Top)SARS-COV-2 Vaccination 07/06/2019, 08/01/2019, 03/18/2020   Pneumococcal Conjugate-13 03/17/2016   Pneumococcal Polysaccharide-23 09/25/2014     Review of Systems:  Negative except as noted in the HPI.   Objective: There were no vitals filed for this visit.  [name] Cooper is a pleasant 72 y.o. female in NAD. AAO X 3.  Vascular Examination: {jgvascular:23595}  Dermatological Examination: {jgderm:23598}  Neurological Examination: {jgneuro:23601::"Protective sensation intact 5/5 intact bilaterally with 10g monofilament b/l.","Vibratory sensation intact b/l.","Proprioception intact bilaterally."}  Musculoskeletal Examination: {jgmsk:23600}  Footwear Assessment: Does the patient wear appropriate shoes? {Yes,No}. Does the patient need inserts/orthotics? {Yes,No}.  Lab Results  Component Value Date   HGBA1C 5.4 09/09/2021   No results found. ADA Risk Categorization: Low Risk :  Patient has all of the following: Intact protective sensation No prior foot ulcer  No severe deformity Pedal pulses present  High Risk  Patient has one or more of the following: Loss of protective sensation Absent pedal pulses Severe Foot deformity History of foot  ulcer  Assessment: 1. Pain due to onychomycosis of toenails of both feet   2. Pes planus of both feet   3. Diabetes mellitus without complication ([place])   4. Encounter for diabetic foot exam ([place])      Plan: No orders of the defined types were placed in this encounter.   No orders of the defined types were placed in this encounter.   None  {jgplan:23602::"-Patient/POA to call should there be question/concern in the interim."} No follow-ups on file.  Marzetta Board, DPM

## 2022-06-20 ENCOUNTER — Encounter: Payer: Self-pay | Admitting: Podiatry

## 2022-06-22 ENCOUNTER — Encounter: Payer: Self-pay | Admitting: Internal Medicine

## 2022-06-29 ENCOUNTER — Encounter: Payer: Self-pay | Admitting: Internal Medicine

## 2022-06-29 ENCOUNTER — Inpatient Hospital Stay: Payer: 59 | Attending: Internal Medicine

## 2022-06-29 ENCOUNTER — Inpatient Hospital Stay (HOSPITAL_BASED_OUTPATIENT_CLINIC_OR_DEPARTMENT_OTHER): Payer: 59 | Admitting: Internal Medicine

## 2022-06-29 ENCOUNTER — Other Ambulatory Visit: Payer: Self-pay

## 2022-06-29 VITALS — BP 174/84 | HR 69 | Temp 98.3°F | Resp 17 | Wt 292.7 lb

## 2022-06-29 DIAGNOSIS — D509 Iron deficiency anemia, unspecified: Secondary | ICD-10-CM | POA: Diagnosis not present

## 2022-06-29 LAB — CBC WITH DIFFERENTIAL (CANCER CENTER ONLY)
Abs Immature Granulocytes: 0.04 10*3/uL (ref 0.00–0.07)
Basophils Absolute: 0.1 10*3/uL (ref 0.0–0.1)
Basophils Relative: 1 %
Eosinophils Absolute: 0.5 10*3/uL (ref 0.0–0.5)
Eosinophils Relative: 6 %
HCT: 39.5 % (ref 36.0–46.0)
Hemoglobin: 11.8 g/dL — ABNORMAL LOW (ref 12.0–15.0)
Immature Granulocytes: 1 %
Lymphocytes Relative: 42 %
Lymphs Abs: 3.6 10*3/uL (ref 0.7–4.0)
MCH: 22.4 pg — ABNORMAL LOW (ref 26.0–34.0)
MCHC: 29.9 g/dL — ABNORMAL LOW (ref 30.0–36.0)
MCV: 75.1 fL — ABNORMAL LOW (ref 80.0–100.0)
Monocytes Absolute: 0.6 10*3/uL (ref 0.1–1.0)
Monocytes Relative: 7 %
Neutro Abs: 3.6 10*3/uL (ref 1.7–7.7)
Neutrophils Relative %: 43 %
Platelet Count: 235 10*3/uL (ref 150–400)
RBC: 5.26 MIL/uL — ABNORMAL HIGH (ref 3.87–5.11)
RDW: 21.2 % — ABNORMAL HIGH (ref 11.5–15.5)
WBC Count: 8.4 10*3/uL (ref 4.0–10.5)
nRBC: 0 % (ref 0.0–0.2)

## 2022-06-29 LAB — IRON AND IRON BINDING CAPACITY (CC-WL,HP ONLY)
Iron: 37 ug/dL (ref 28–170)
Saturation Ratios: 11 % (ref 10.4–31.8)
TIBC: 326 ug/dL (ref 250–450)
UIBC: 289 ug/dL (ref 148–442)

## 2022-06-29 LAB — FERRITIN: Ferritin: 37 ng/mL (ref 11–307)

## 2022-06-29 NOTE — Progress Notes (Signed)
[facility] Telephone:(336) [phone]   Fax:(336) [phone]  OFFICE PROGRESS NOTE  [name], NP [address] [place] [address]  DIAGNOSIS: Iron deficiency anemia Bone marrow biopsy and aspirate in April 2023 was nonspecific but could not exclude early MDS.   PRIOR THERAPY: None   CURRENT THERAPY:  1) IV iron PRN with venofer 300 mg weekly for 3 weeks at a time.   2) Oral iron supplement with integra plus p.o. daily.    INTERVAL HISTORY: [name] Cooper 72 y.o. female returns to the clinic today for follow-up visit.  The patient is feeling fine with no concerning complaints.  She was accompanied by her brother.  She denied having any chest pain, shortness of breath, cough or hemoptysis.  She has no nausea, vomiting, diarrhea or constipation.  She tolerated the previous iron infusion fairly well.  She is feeling a little bit better after the infusion with less fatigue.  She is here for evaluation and repeat CBC, iron study and ferritin.  MEDICAL HISTORY: Past Medical History:  Diagnosis Date   Anemia    Arthritis    Cancer ([place])    breast and ovarian   Chicken pox    Chronic lower back pain    COPD (chronic obstructive pulmonary disease) (HCC)    Diabetes (HCC)    GERD (gastroesophageal reflux disease)    Hepatitis 1970's   History of recurrent UTIs    Hyperlipemia    Hypertension    Slow heart rate dx'd 09/24/2014   Urine incontinence     ALLERGIES:  is allergic to lisinopril and codeine.  MEDICATIONS:  Current Outpatient Medications  Medication Sig Dispense Refill   acetaminophen (TYLENOL) 650 MG CR tablet Take 1,300 mg by mouth every 8 (eight) hours as needed for pain.     albuterol (VENTOLIN HFA) 108 (90 Base) MCG/ACT inhaler Inhale 2 puffs into the lungs every 6 (six) hours as needed for wheezing or shortness of breath. 1 each 3   Ascorbic Acid (VITAMIN C) 500 MG CAPS Take 500 mg by mouth daily.     aspirin EC 81 MG tablet Take 1 tablet (81  mg total) by mouth daily. 90 tablet 3   cholecalciferol (VITAMIN D3) 25 MCG (1000 UNIT) tablet Take 1,000 Units by mouth daily.     ezetimibe (ZETIA) 10 MG tablet Take 1 tablet (10 mg total) by mouth daily. 90 tablet 3   furosemide (LASIX) 20 MG tablet Take 20 mg by mouth daily as needed for fluid.     HYDROcodone-acetaminophen (NORCO/VICODIN) 5-325 MG tablet Take 1 tablet by mouth 2 (two) times daily as needed.     isosorbide mononitrate (IMDUR) 30 MG 24 hr tablet Take 1 tablet by mouth once daily 90 tablet 3   nitroGLYCERIN (NITROSTAT) 0.4 MG SL tablet Place 1 tablet (0.4 mg total) under the tongue every 5 (five) minutes as needed for chest pain. 25 tablet prn   rosuvastatin (CRESTOR) 20 MG tablet Take 1 tablet by mouth once daily 21 tablet 1   traMADol (ULTRAM) 50 MG tablet Take 1-2 tablets (50-100 mg total) by mouth every 6 (six) hours as needed for moderate pain. 15 tablet 0   No current facility-administered medications for this visit.    SURGICAL HISTORY:  Past Surgical History:  Procedure Laterality Date   ABDOMINAL HYSTERECTOMY  1984   APPENDECTOMY     BREAST BIOPSY Right 1996   BREAST LUMPECTOMY Right 1996   CYSTOSCOPY  WITH URETEROSCOPY AND STENT PLACEMENT Left 09/18/2021   Procedure: CYSTOSCOPY WITH LEFT DIAGNOSTIC URETEROSCOPY, RETROGRADE PYELOGRAM, AND STENT PLACEMENT;  Surgeon: [name], MD;  Location: [facility];  Service: Urology;  Laterality: Left;  3 MINS   LEFT HEART CATH AND CORONARY ANGIOGRAPHY N/A 01/13/2019   Procedure: LEFT HEART CATH AND CORONARY ANGIOGRAPHY;  Surgeon: Nelva Bush, MD;  Location: [facility];  Service: Cardiovascular;  Laterality: N/A;   MASTECTOMY Right 1996   TONSILLECTOMY      REVIEW OF SYSTEMS:  A comprehensive review of systems was negative except for: Constitutional: positive for fatigue   PHYSICAL EXAMINATION: General appearance: alert, cooperative, fatigued, and no distress Head: Normocephalic, without obvious  abnormality, atraumatic Neck: no adenopathy, no JVD, supple, symmetrical, trachea midline, and thyroid not enlarged, symmetric, no tenderness/mass/nodules Lymph nodes: Cervical, supraclavicular, and axillary nodes normal. Resp: clear to auscultation bilaterally Back: symmetric, no curvature. ROM normal. No CVA tenderness. Cardio: regular rate and rhythm, S1, S2 normal, no murmur, click, rub or gallop GI: soft, non-tender; bowel sounds normal; no masses,  no organomegaly Extremities: extremities normal, atraumatic, no cyanosis or edema  ECOG PERFORMANCE STATUS: 1 - Symptomatic but completely ambulatory  Blood pressure (!) 174/84, pulse 69, temperature 98.3 F (36.8 C), temperature source Oral, resp. rate 17, weight 292 lb 11.2 oz (132.8 kg), SpO2 97 %.  LABORATORY DATA: Lab Results  Component Value Date   WBC 8.4 06/29/2022   HGB 11.8 (L) 06/29/2022   HCT 39.5 06/29/2022   MCV 75.1 (L) 06/29/2022   PLT 235 06/29/2022      Chemistry      Component Value Date/Time   NA 141 12/10/2021 0312   NA 140 01/30/2019 1330   K 3.8 12/10/2021 0312   CL 111 12/10/2021 0312   CO2 22 12/10/2021 0312   BUN 13 12/10/2021 0312   BUN 18 01/30/2019 1330   CREATININE 1.18 (H) 12/10/2021 0312   CREATININE 1.20 (H) 12/21/2019 1336   CREATININE 1.28 (H) 09/21/2016 1115      Component Value Date/Time   CALCIUM 8.6 (L) 12/10/2021 0312   ALKPHOS 88 07/08/2020 1422   AST 13 07/08/2020 1422   AST 12 (L) 12/21/2019 1336   ALT 10 06/05/2021 0748   ALT 8 12/21/2019 1336   BILITOT 0.4 07/08/2020 1422   BILITOT 0.4 12/21/2019 1336       RADIOGRAPHIC STUDIES: No results found.  ASSESSMENT AND PLAN: This is a very pleasant 72 years old African-American female with iron deficiency anemia status post Venofer infusion in October 2021.   The patient is status post iron infusion with Venofer completed last year and tolerated it fairly well.  She has been on oral iron tablet with Integra +1 capsule p.o.  daily. The patient was treated with iron infusion with Venofer several times recently even at a higher dose of 400 mg but no significant improvement in her anemia.   She had a recent bone marrow biopsy and aspirate that showed no specific findings unlikely to be reactive in nature but early MDS could not be excluded. She had extensive blood work in the past that was unremarkable to explain the underlying etiology of her microcytic anemia except for the possibility of gastrointestinal blood loss but she had colonoscopy and upper endoscopy in July 2021 with removal of few polyps.  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 The patient is currently on oral iron tablet with Integra +1 capsule p.o. daily.  She has  been tolerating this treatment well except for intermittent constipation. She has been tolerating this treatment well with no concerning adverse effects. Repeat CBC today showed improvement of her hemoglobin up to 11.8 and hematocrit of 39.5%.  Iron study and ferritin are still pending I recommended for the patient to continue her current treatment with Integra +1 capsule p.o. daily.  Depending on the iron study I may consider her for additional iron infusion if needed. The patient on will come back for follow-up visit in 3 months for evaluation and repeat blood work. She was advised to call immediately if she has any other concerning symptoms in the interval. The patient voices understanding of current disease status and treatment options and is in agreement with the current care plan.  All questions were answered. The patient knows to call the clinic with any problems, questions or concerns. We can certainly see the patient much sooner if necessary.  Disclaimer: This note was dictated with voice recognition software. Similar sounding words can inadvertently be transcribed and may not be corrected upon review.

## 2022-07-14 ENCOUNTER — Encounter: Payer: Self-pay | Admitting: Internal Medicine

## 2022-09-29 ENCOUNTER — Other Ambulatory Visit: Payer: Self-pay

## 2022-09-29 ENCOUNTER — Inpatient Hospital Stay: Payer: 59 | Attending: Internal Medicine

## 2022-09-29 ENCOUNTER — Inpatient Hospital Stay (HOSPITAL_BASED_OUTPATIENT_CLINIC_OR_DEPARTMENT_OTHER): Payer: 59 | Admitting: Internal Medicine

## 2022-09-29 VITALS — BP 143/86 | HR 77 | Temp 97.8°F | Resp 18 | Wt 302.0 lb

## 2022-09-29 DIAGNOSIS — D509 Iron deficiency anemia, unspecified: Secondary | ICD-10-CM | POA: Insufficient documentation

## 2022-09-29 DIAGNOSIS — Z79899 Other long term (current) drug therapy: Secondary | ICD-10-CM | POA: Diagnosis not present

## 2022-09-29 DIAGNOSIS — Z7982 Long term (current) use of aspirin: Secondary | ICD-10-CM | POA: Diagnosis not present

## 2022-09-29 DIAGNOSIS — D508 Other iron deficiency anemias: Secondary | ICD-10-CM

## 2022-09-29 LAB — CBC WITH DIFFERENTIAL (CANCER CENTER ONLY)
Abs Immature Granulocytes: 0.02 10*3/uL (ref 0.00–0.07)
Basophils Absolute: 0.1 10*3/uL (ref 0.0–0.1)
Basophils Relative: 1 %
Eosinophils Absolute: 0.5 10*3/uL (ref 0.0–0.5)
Eosinophils Relative: 6 %
HCT: 37.7 % (ref 36.0–46.0)
Hemoglobin: 11.8 g/dL — ABNORMAL LOW (ref 12.0–15.0)
Immature Granulocytes: 0 %
Lymphocytes Relative: 38 %
Lymphs Abs: 3.3 10*3/uL (ref 0.7–4.0)
MCH: 23.6 pg — ABNORMAL LOW (ref 26.0–34.0)
MCHC: 31.3 g/dL (ref 30.0–36.0)
MCV: 75.4 fL — ABNORMAL LOW (ref 80.0–100.0)
Monocytes Absolute: 0.8 10*3/uL (ref 0.1–1.0)
Monocytes Relative: 10 %
Neutro Abs: 3.9 10*3/uL (ref 1.7–7.7)
Neutrophils Relative %: 45 %
Platelet Count: 253 10*3/uL (ref 150–400)
RBC: 5 MIL/uL (ref 3.87–5.11)
RDW: 16.2 % — ABNORMAL HIGH (ref 11.5–15.5)
WBC Count: 8.5 10*3/uL (ref 4.0–10.5)
nRBC: 0 % (ref 0.0–0.2)

## 2022-09-29 LAB — IRON AND IRON BINDING CAPACITY (CC-WL,HP ONLY)
Iron: 54 ug/dL (ref 28–170)
Saturation Ratios: 16 % (ref 10.4–31.8)
TIBC: 342 ug/dL (ref 250–450)
UIBC: 288 ug/dL (ref 148–442)

## 2022-09-29 LAB — FERRITIN: Ferritin: 20 ng/mL (ref 11–307)

## 2022-09-29 NOTE — Progress Notes (Signed)
[facility] Telephone:(336) [phone]   Fax:([phone]  OFFICE PROGRESS NOTE  Loura Back, NP [address] [place] [address]  DIAGNOSIS: Iron deficiency anemia Bone marrow biopsy and aspirate in April 2023 was nonspecific but could not exclude early MDS.   PRIOR THERAPY: None   CURRENT THERAPY:  1) IV iron PRN with venofer 300 mg weekly for 3 weeks at a time.   2) Oral iron supplement with integra plus p.o. daily.    INTERVAL HISTORY: [name] Cooper 72 y.o. female returns to the clinic today for follow-up visit accompanied by her brother Greggory Stallion.  The patient is feeling fine today with no concerning complaints.  She denied having any current chest pain, shortness of breath, cough or hemoptysis.  She has no nausea, vomiting, diarrhea or constipation.  She has no headache or visual changes.  She has no significant fatigue or weakness.  She continues to tolerate her oral iron supplement fairly well especially when she takes it at nighttime.  She is here today for evaluation and repeat blood work.  MEDICAL HISTORY: Past Medical History:  Diagnosis Date   Anemia    Arthritis    Cancer (HCC)    breast and ovarian   Chicken pox    Chronic lower back pain    COPD (chronic obstructive pulmonary disease) (HCC)    Diabetes (HCC)    GERD (gastroesophageal reflux disease)    Hepatitis 1970's   History of recurrent UTIs    Hyperlipemia    Hypertension    Slow heart rate dx'd 09/24/2014   Urine incontinence     ALLERGIES:  is allergic to lisinopril and codeine.  MEDICATIONS:  Current Outpatient Medications  Medication Sig Dispense Refill   acetaminophen (TYLENOL) 650 MG CR tablet Take 1,300 mg by mouth every 8 (eight) hours as needed for pain.     albuterol (VENTOLIN HFA) 108 (90 Base) MCG/ACT inhaler Inhale 2 puffs into the lungs every 6 (six) hours as needed for wheezing or shortness of breath. 1 each 3   Ascorbic Acid (VITAMIN C) 500 MG CAPS Take 500 mg by  mouth daily.     aspirin EC 81 MG tablet Take 1 tablet (81 mg total) by mouth daily. 90 tablet 3   cholecalciferol (VITAMIN D3) 25 MCG (1000 UNIT) tablet Take 1,000 Units by mouth daily.     ezetimibe (ZETIA) 10 MG tablet Take 1 tablet (10 mg total) by mouth daily. 90 tablet 3   furosemide (LASIX) 20 MG tablet Take 20 mg by mouth daily as needed for fluid.     HYDROcodone-acetaminophen (NORCO/VICODIN) 5-325 MG tablet Take 1 tablet by mouth 2 (two) times daily as needed.     isosorbide mononitrate (IMDUR) 30 MG 24 hr tablet Take 1 tablet by mouth once daily 90 tablet 3   nitroGLYCERIN (NITROSTAT) 0.4 MG SL tablet Place 1 tablet (0.4 mg total) under the tongue every 5 (five) minutes as needed for chest pain. 25 tablet prn   rosuvastatin (CRESTOR) 20 MG tablet Take 1 tablet by mouth once daily 21 tablet 1   traMADol (ULTRAM) 50 MG tablet Take 1-2 tablets (50-100 mg total) by mouth every 6 (six) hours as needed for moderate pain. 15 tablet 0   No current facility-administered medications for this visit.    SURGICAL HISTORY:  Past Surgical History:  Procedure Laterality Date   ABDOMINAL HYSTERECTOMY  1984   APPENDECTOMY     BREAST BIOPSY Right 1996  BREAST LUMPECTOMY Right 1996   CYSTOSCOPY WITH URETEROSCOPY AND STENT PLACEMENT Left 09/18/2021   Procedure: CYSTOSCOPY WITH LEFT DIAGNOSTIC URETEROSCOPY, RETROGRADE PYELOGRAM, AND STENT PLACEMENT;  Surgeon: Crist Fat, MD;  Location: [facility];  Service: Urology;  Laterality: Left;  75 MINS   LEFT HEART CATH AND CORONARY ANGIOGRAPHY N/A 01/13/2019   Procedure: LEFT HEART CATH AND CORONARY ANGIOGRAPHY;  Surgeon: Yvonne Kendall, MD;  Location: [facility];  Service: Cardiovascular;  Laterality: N/A;   MASTECTOMY Right 1996   TONSILLECTOMY      REVIEW OF SYSTEMS:  A comprehensive review of systems was negative.   PHYSICAL EXAMINATION: General appearance: alert, cooperative, and no distress Head: Normocephalic, without obvious  abnormality, atraumatic Neck: no adenopathy, no JVD, supple, symmetrical, trachea midline, and thyroid not enlarged, symmetric, no tenderness/mass/nodules Lymph nodes: Cervical, supraclavicular, and axillary nodes normal. Resp: clear to auscultation bilaterally Back: symmetric, no curvature. ROM normal. No CVA tenderness. Cardio: regular rate and rhythm, S1, S2 normal, no murmur, click, rub or gallop GI: soft, non-tender; bowel sounds normal; no masses,  no organomegaly Extremities: extremities normal, atraumatic, no cyanosis or edema  ECOG PERFORMANCE STATUS: 1 - Symptomatic but completely ambulatory  Blood pressure (!) 143/86, pulse 77, temperature 97.8 F (36.6 C), temperature source Oral, resp. rate 18, weight (!) 302 lb (137 kg), SpO2 92 %.  LABORATORY DATA: Lab Results  Component Value Date   WBC 8.5 09/29/2022   HGB 11.8 (L) 09/29/2022   HCT 37.7 09/29/2022   MCV 75.4 (L) 09/29/2022   PLT 253 09/29/2022      Chemistry      Component Value Date/Time   NA 141 12/10/2021 0312   NA 140 01/30/2019 1330   K 3.8 12/10/2021 0312   CL 111 12/10/2021 0312   CO2 22 12/10/2021 0312   BUN 13 12/10/2021 0312   BUN 18 01/30/2019 1330   CREATININE 1.18 (H) 12/10/2021 0312   CREATININE 1.20 (H) 12/21/2019 1336   CREATININE 1.28 (H) 09/21/2016 1115      Component Value Date/Time   CALCIUM 8.6 (L) 12/10/2021 0312   ALKPHOS 88 07/08/2020 1422   AST 13 07/08/2020 1422   AST 12 (L) 12/21/2019 1336   ALT 10 06/05/2021 0748   ALT 8 12/21/2019 1336   BILITOT 0.4 07/08/2020 1422   BILITOT 0.4 12/21/2019 1336       RADIOGRAPHIC STUDIES: No results found.  ASSESSMENT AND PLAN: This is a very pleasant 72 years old African-American female with iron deficiency anemia status post Venofer infusion in October 2021.   The patient is status post iron infusion with Venofer completed last year and tolerated it fairly well.  She has been on oral iron tablet with Integra +1 capsule p.o.  daily. The patient was treated with iron infusion with Venofer several times recently even at a higher dose of 400 mg but no significant improvement in her anemia.   She had a recent bone marrow biopsy and aspirate that showed no specific findings unlikely to be reactive in nature but early MDS could not be excluded. She had extensive blood work in the past that was unremarkable to explain the underlying etiology of her microcytic anemia except for the possibility of gastrointestinal blood loss but she had colonoscopy and upper endoscopy in July 2021 with removal of few polyps.  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 The patient is currently on oral iron tablet with Integra +1 capsule p.o. daily. The patient has been  tolerating this treatment well with no concerning adverse effects especially when she takes it at nighttime. Repeat blood work today is unremarkable except for mild anemia with hemoglobin of 11.8 but her iron study was normal.  Ferritin level is still pending. I recommended for the patient to continue her current treatment with Integra +1 capsule p.o. daily. I will see her back for follow-up visit in 6 months for evaluation with repeat blood work but she was advised to call immediately if she has any other concerning symptoms in the interval. The patient voices understanding of current disease status and treatment options and is in agreement with the current care plan.  All questions were answered. The patient knows to call the clinic with any problems, questions or concerns. We can certainly see the patient much sooner if necessary.  Disclaimer: This note was dictated with voice recognition software. Similar sounding words can inadvertently be transcribed and may not be corrected upon review.

## 2022-10-19 ENCOUNTER — Other Ambulatory Visit: Payer: Self-pay | Admitting: Registered Nurse

## 2022-10-19 DIAGNOSIS — Z1231 Encounter for screening mammogram for malignant neoplasm of breast: Secondary | ICD-10-CM

## 2022-10-21 ENCOUNTER — Ambulatory Visit (INDEPENDENT_AMBULATORY_CARE_PROVIDER_SITE_OTHER): Payer: 59 | Admitting: Podiatry

## 2022-10-21 ENCOUNTER — Encounter: Payer: Self-pay | Admitting: Podiatry

## 2022-10-21 DIAGNOSIS — E119 Type 2 diabetes mellitus without complications: Secondary | ICD-10-CM

## 2022-10-21 DIAGNOSIS — M79674 Pain in right toe(s): Secondary | ICD-10-CM

## 2022-10-21 DIAGNOSIS — M79675 Pain in left toe(s): Secondary | ICD-10-CM

## 2022-10-21 DIAGNOSIS — B351 Tinea unguium: Secondary | ICD-10-CM

## 2022-10-26 NOTE — Progress Notes (Signed)
  Subjective:  Patient ID: [name] Cooper, female    DOB: 01/01/1951,  MRN: [id number]  Alli Cannizzo presents to clinic today for preventative diabetic foot care and painful elongated mycotic toenails 1-5 bilaterally which are tender when wearing enclosed shoe gear. Pain is relieved with periodic professional debridement.  Chief Complaint  Patient presents with   NAIL CARE    RFC   New problem(s): None.   PCP is Loura Back, NP.  Allergies  Allergen Reactions   Lisinopril Anaphylaxis   Codeine     REACTION: MAKES HER SLEEPY    Review of Systems: Negative except as noted in the HPI.  Objective: No changes noted in today's physical examination. There were no vitals filed for this visit. [name] Cooper is a pleasant 72 y.o. female morbidly obese in NAD. AAO x 3.  Vascular Examination: Capillary refill time immediate b/l. Vascular status intact b/l with palpable pedal pulses. Pedal hair absent b/l. No edema. No pain with calf compression b/l. Skin temperature gradient WNL b/l. No cyanosis or clubbing noted b/l LE.  Neurological Examination: Sensation grossly intact b/l with 10 gram monofilament. Vibratory sensation intact b/l.   Dermatological Examination: Pedal skin with normal turgor, texture and tone b/l.  No open wounds. No interdigital macerations.   Toenails 1-5 b/l thick, discolored, elongated with subungual debris and pain on dorsal palpation.   No hyperkeratotic nor porokeratotic lesions present on today's visit.  Musculoskeletal Examination: Normal muscle strength 5/5 to all lower extremity muscle groups bilaterally. Pes planus deformity noted bilateral LE.Marland Kitchen No pain, crepitus or joint limitation noted with ROM b/l LE.  Patient ambulates independently without assistive aids.  Radiographs: None  Assessment/Plan: 1. Pain due to onychomycosis of toenails of both feet   2. Diabetes mellitus without complication (HCC)     -Consent given for treatment as described  below: -Examined patient. -Patient to continue soft, supportive shoe gear daily. -Mycotic toenails 1-5 bilaterally were debrided in length and girth with sterile nail nippers and dremel without incident. -Patient/POA to call should there be question/concern in the interim.   Return in about 3 months (around 01/21/2023).  Freddie Breech, DPM

## 2022-11-30 ENCOUNTER — Ambulatory Visit
Admission: RE | Admit: 2022-11-30 | Discharge: 2022-11-30 | Disposition: A | Discharge status: Home / Self Care | Payer: 59 | Source: Ambulatory Visit | Attending: Registered Nurse | Admitting: Registered Nurse

## 2022-11-30 DIAGNOSIS — Z1231 Encounter for screening mammogram for malignant neoplasm of breast: Secondary | ICD-10-CM

## 2022-12-16 ENCOUNTER — Ambulatory Visit
Admission: RE | Admit: 2022-12-16 | Discharge: 2022-12-16 | Disposition: A | Discharge status: Home / Self Care | Payer: 59 | Source: Ambulatory Visit

## 2022-12-16 DIAGNOSIS — Z122 Encounter for screening for malignant neoplasm of respiratory organs: Secondary | ICD-10-CM

## 2022-12-16 DIAGNOSIS — F17210 Nicotine dependence, cigarettes, uncomplicated: Secondary | ICD-10-CM

## 2022-12-16 DIAGNOSIS — Z87891 Personal history of nicotine dependence: Secondary | ICD-10-CM

## 2022-12-25 ENCOUNTER — Other Ambulatory Visit: Payer: Self-pay | Admitting: Acute Care

## 2022-12-25 DIAGNOSIS — Z122 Encounter for screening for malignant neoplasm of respiratory organs: Secondary | ICD-10-CM

## 2022-12-25 DIAGNOSIS — F17210 Nicotine dependence, cigarettes, uncomplicated: Secondary | ICD-10-CM

## 2022-12-25 DIAGNOSIS — Z87891 Personal history of nicotine dependence: Secondary | ICD-10-CM

## 2023-02-03 ENCOUNTER — Encounter: Payer: Self-pay | Admitting: Podiatry

## 2023-02-03 ENCOUNTER — Ambulatory Visit (INDEPENDENT_AMBULATORY_CARE_PROVIDER_SITE_OTHER): Payer: 59 | Admitting: Podiatry

## 2023-02-03 DIAGNOSIS — M79674 Pain in right toe(s): Secondary | ICD-10-CM

## 2023-02-03 DIAGNOSIS — M79675 Pain in left toe(s): Secondary | ICD-10-CM

## 2023-02-03 DIAGNOSIS — B351 Tinea unguium: Secondary | ICD-10-CM | POA: Diagnosis not present

## 2023-02-03 DIAGNOSIS — E119 Type 2 diabetes mellitus without complications: Secondary | ICD-10-CM | POA: Diagnosis not present

## 2023-02-08 NOTE — Progress Notes (Signed)
Subjective:  Patient ID: [name] Cooper, female    DOB: March 17, 1951,  MRN: [id number]  72 y.o. female presents to clinic with  preventative diabetic foot care and painful thick toenails that are difficult to trim. Pain interferes with ambulation. Aggravating factors include wearing enclosed shoe gear. Pain is relieved with periodic professional debridement.  Chief Complaint  Patient presents with   Diabetes    DFC No bs taken 01/13/2024 A1C 6.7 PCP- Appt 12/25  No concerns at this time     New problem(s): None   PCP is Loura Back, NP.  Allergies  Allergen Reactions   Lisinopril Anaphylaxis   Codeine     REACTION: MAKES HER SLEEPY    Review of Systems: Negative except as noted in the HPI.   Objective:  [name] Cooper is a pleasant 72 y.o. female morbidly obese in NAD.Marland Kitchen  Vascular Examination: Vascular status intact b/l with palpable pedal pulses. CFT immediate b/l. No edema. No pain with calf compression b/l. Skin temperature gradient WNL b/l. No cyanosis or clubbing noted b/l LE.  Neurological Examination: Sensation grossly intact b/l with 10 gram monofilament. Vibratory sensation intact b/l.   Dermatological Examination: Pedal skin with normal turgor, texture and tone b/l. Toenails 1-5 b/l thick, discolored, elongated with subungual debris and pain on dorsal palpation. No hyperkeratotic lesions noted b/l.   Musculoskeletal Examination: Muscle strength 5/5 to b/l LE. Pes planus deformity noted bilateral LE.  Radiographs: None   Assessment:   1. Pain due to onychomycosis of toenails of both feet   2. Diabetes mellitus without complication (HCC)    Plan:  -Patient was evaluated and treated. All patient's and/or POA's questions/concerns answered on today's visit. -No new findings. No new orders. -Continue supportive shoe gear daily. -Toenails 1-5 b/l were debrided in length and girth with sterile nail nippers and dremel without iatrogenic bleeding.  -Patient/POA to  call should there be question/concern in the interim.  Return in about 3 months (around 05/06/2023).  Freddie Breech, DPM

## 2023-02-20 ENCOUNTER — Other Ambulatory Visit: Payer: Self-pay | Admitting: Cardiology

## 2023-03-18 ENCOUNTER — Other Ambulatory Visit: Payer: Self-pay | Admitting: Cardiology

## 2023-04-01 ENCOUNTER — Ambulatory Visit: Payer: 59 | Admitting: Internal Medicine

## 2023-04-01 ENCOUNTER — Other Ambulatory Visit: Payer: 59

## 2023-04-29 ENCOUNTER — Inpatient Hospital Stay: Payer: 59 | Attending: Internal Medicine

## 2023-04-29 ENCOUNTER — Inpatient Hospital Stay (HOSPITAL_BASED_OUTPATIENT_CLINIC_OR_DEPARTMENT_OTHER): Payer: 59 | Admitting: Internal Medicine

## 2023-04-29 VITALS — BP 131/72 | HR 66 | Temp 97.7°F | Resp 18 | Ht 66.0 in | Wt 288.9 lb

## 2023-04-29 DIAGNOSIS — D508 Other iron deficiency anemias: Secondary | ICD-10-CM

## 2023-04-29 DIAGNOSIS — D509 Iron deficiency anemia, unspecified: Secondary | ICD-10-CM | POA: Insufficient documentation

## 2023-04-29 LAB — CBC WITH DIFFERENTIAL (CANCER CENTER ONLY)
Abs Immature Granulocytes: 0.01 10*3/uL (ref 0.00–0.07)
Basophils Absolute: 0.1 10*3/uL (ref 0.0–0.1)
Basophils Relative: 1 %
Eosinophils Absolute: 0.5 10*3/uL (ref 0.0–0.5)
Eosinophils Relative: 5 %
HCT: 36.5 % (ref 36.0–46.0)
Hemoglobin: 10.8 g/dL — ABNORMAL LOW (ref 12.0–15.0)
Immature Granulocytes: 0 %
Lymphocytes Relative: 35 %
Lymphs Abs: 3.6 10*3/uL (ref 0.7–4.0)
MCH: 21.1 pg — ABNORMAL LOW (ref 26.0–34.0)
MCHC: 29.6 g/dL — ABNORMAL LOW (ref 30.0–36.0)
MCV: 71.4 fL — ABNORMAL LOW (ref 80.0–100.0)
Monocytes Absolute: 0.9 10*3/uL (ref 0.1–1.0)
Monocytes Relative: 9 %
Neutro Abs: 5 10*3/uL (ref 1.7–7.7)
Neutrophils Relative %: 50 %
Platelet Count: 278 10*3/uL (ref 150–400)
RBC: 5.11 MIL/uL (ref 3.87–5.11)
RDW: 22.5 % — ABNORMAL HIGH (ref 11.5–15.5)
WBC Count: 10.1 10*3/uL (ref 4.0–10.5)
nRBC: 0 % (ref 0.0–0.2)

## 2023-04-29 LAB — IRON AND IRON BINDING CAPACITY (CC-WL,HP ONLY)
Iron: 38 ug/dL (ref 28–170)
Saturation Ratios: 12 % (ref 10.4–31.8)
TIBC: 323 ug/dL (ref 250–450)
UIBC: 285 ug/dL (ref 148–442)

## 2023-04-29 LAB — FERRITIN: Ferritin: 20 ng/mL (ref 11–307)

## 2023-04-29 NOTE — Progress Notes (Signed)
 [facility] Telephone:([phone]) [phone]   Fax:([phone]  OFFICE PROGRESS NOTE  Leontine Cramp, NP [address] [place] [address]  DIAGNOSIS: Iron  deficiency anemia Bone marrow biopsy and aspirate in April 2023 was nonspecific but could not exclude early MDS.   PRIOR THERAPY: None   CURRENT THERAPY:  1) IV iron  PRN with venofer  300 mg weekly for 3 weeks at a time.   2) Oral iron  supplement with integra plus  p.o. daily.    INTERVAL HISTORY: [name] Cooper 73 y.o. female returns to the clinic today for follow-up visit accompanied by her husband.Discussed the use of AI scribe software for clinical note transcription with the patient, who gave verbal consent to proceed.  History of Present Illness   The 73 year old patient with a history of iron  deficiency anemia has been under treatment for several years, including iron  infusions with Venofer  in the past. Currently, she is on a daily regimen of Integra Plus , an iron  supplement. She reports constipation as a side effect of the iron  supplement but denies any other issues such as upset stomach. She denies any current symptoms of increasing fatigue, weakness, or dizzy spells, and reports no noticeable bleeding. She does report shortness of breath, but only when walking uphill. Over the past six to seven months, her hemoglobin levels have dropped from 11.8 to 10.8. The patient has expressed no concerns about receiving iron  infusions, but finds the process time-consuming.       MEDICAL HISTORY: Past Medical History:  Diagnosis Date   Anemia    Arthritis    Cancer (HCC)    breast and ovarian   Chicken pox    Chronic lower back pain    COPD (chronic obstructive pulmonary disease) (HCC)    Diabetes (HCC)    GERD (gastroesophageal reflux disease)    Hepatitis 1970's   History of recurrent UTIs    Hyperlipemia    Hypertension    Slow heart rate dx'd 09/24/2014   Urine incontinence     ALLERGIES:  is allergic to  lisinopril and codeine.  MEDICATIONS:  Current Outpatient Medications  Medication Sig Dispense Refill   acetaminophen  (TYLENOL ) 650 MG CR tablet Take 1,300 mg by mouth every 8 (eight) hours as needed for pain.     albuterol  (VENTOLIN  HFA) 108 (90 Base) MCG/ACT inhaler Inhale 2 puffs into the lungs every 6 (six) hours as needed for wheezing or shortness of breath. 1 each 3   Ascorbic Acid  (VITAMIN C) 500 MG CAPS Take 500 mg by mouth daily.     aspirin  EC 81 MG tablet Take 1 tablet (81 mg total) by mouth daily. 90 tablet 3   cholecalciferol (VITAMIN D3) 25 MCG (1000 UNIT) tablet Take 1,000 Units by mouth daily.     ezetimibe  (ZETIA ) 10 MG tablet Take 1 tablet (10 mg total) by mouth daily. 90 tablet 3   furosemide  (LASIX ) 20 MG tablet Take 20 mg by mouth daily as needed for fluid.     HYDROcodone -acetaminophen  (NORCO/VICODIN) 5-325 MG tablet Take 1 tablet by mouth 2 (two) times daily as needed.     isosorbide  mononitrate (IMDUR ) 30 MG 24 hr tablet TAKE 1 TABLET BY MOUTH ONCE DAILY . APPOINTMENT REQUIRED FOR FUTURE REFILLS PLEASE  CALL  [phone] 15 tablet 0   nitroGLYCERIN  (NITROSTAT ) 0.4 MG SL tablet Place 1 tablet (0.4 mg total) under the tongue every 5 (five) minutes as needed for chest pain. 25 tablet prn   rosuvastatin  (CRESTOR )  20 MG tablet Take 1 tablet by mouth once daily 21 tablet 1   RYBELSUS 3 MG TABS Take 1 tablet by mouth every morning.     traMADol  (ULTRAM ) 50 MG tablet Take 1-2 tablets (50-100 mg total) by mouth every 6 (six) hours as needed for moderate pain. 15 tablet 0   No current facility-administered medications for this visit.    SURGICAL HISTORY:  Past Surgical History:  Procedure Laterality Date   ABDOMINAL HYSTERECTOMY  1984   APPENDECTOMY     BREAST BIOPSY Right 1996   BREAST LUMPECTOMY Right 1996   CYSTOSCOPY WITH URETEROSCOPY AND STENT PLACEMENT Left 09/18/2021   Procedure: CYSTOSCOPY WITH LEFT DIAGNOSTIC URETEROSCOPY, RETROGRADE PYELOGRAM, AND STENT PLACEMENT;   Surgeon: Cam Morene ORN, MD;  Location: [facility];  Service: Urology;  Laterality: Left;  75 MINS   LEFT HEART CATH AND CORONARY ANGIOGRAPHY N/A 01/13/2019   Procedure: LEFT HEART CATH AND CORONARY ANGIOGRAPHY;  Surgeon: Mady Bruckner, MD;  Location: [facility];  Service: Cardiovascular;  Laterality: N/A;   MASTECTOMY Right 1996   TONSILLECTOMY      REVIEW OF SYSTEMS:  A comprehensive review of systems was negative except for: Respiratory: positive for dyspnea on exertion Gastrointestinal: positive for constipation   PHYSICAL EXAMINATION: General appearance: alert, cooperative, and no distress Head: Normocephalic, without obvious abnormality, atraumatic Neck: no adenopathy, no JVD, supple, symmetrical, trachea midline, and thyroid  not enlarged, symmetric, no tenderness/mass/nodules Lymph nodes: Cervical, supraclavicular, and axillary nodes normal. Resp: clear to auscultation bilaterally Back: symmetric, no curvature. ROM normal. No CVA tenderness. Cardio: regular rate and rhythm, S1, S2 normal, no murmur, click, rub or gallop GI: soft, non-tender; bowel sounds normal; no masses,  no organomegaly Extremities: extremities normal, atraumatic, no cyanosis or edema  ECOG PERFORMANCE STATUS: 1 - Symptomatic but completely ambulatory  Blood pressure 131/72, pulse 66, temperature 97.7 F (36.5 C), temperature source Temporal, resp. rate 18, height 5' 6 (1.676 m), weight 288 lb 14.4 oz (131 kg), SpO2 94%.  LABORATORY DATA: Lab Results  Component Value Date   WBC 10.1 04/29/2023   HGB 10.8 (L) 04/29/2023   HCT 36.5 04/29/2023   MCV 71.4 (L) 04/29/2023   PLT 278 04/29/2023      Chemistry      Component Value Date/Time   NA 141 12/10/2021 0312   NA 140 01/30/2019 1330   K 3.8 12/10/2021 0312   CL 111 12/10/2021 0312   CO2 22 12/10/2021 0312   BUN 13 12/10/2021 0312   BUN 18 01/30/2019 1330   CREATININE 1.18 (H) 12/10/2021 0312   CREATININE 1.20 (H) 12/21/2019 1336    CREATININE 1.28 (H) 09/21/2016 1115      Component Value Date/Time   CALCIUM  8.6 (L) 12/10/2021 0312   ALKPHOS 88 07/08/2020 1422   AST 13 07/08/2020 1422   AST 12 (L) 12/21/2019 1336   ALT 10 06/05/2021 0748   ALT 8 12/21/2019 1336   BILITOT 0.4 07/08/2020 1422   BILITOT 0.4 12/21/2019 1336       RADIOGRAPHIC STUDIES: No results found.  ASSESSMENT AND PLAN: This is a very pleasant 73 years old African-American female with iron  deficiency anemia status post Venofer  infusion in October 2021.   The patient is status post iron  infusion with Venofer  completed last year and tolerated it fairly well.  She has been on oral iron  tablet with Integra +1 capsule p.o. daily. The patient was treated with iron  infusion with Venofer  several times recently even at a  higher dose of 400 mg but no significant improvement in her anemia.   She had a recent bone marrow biopsy and aspirate that showed no specific findings unlikely to be reactive in nature but early MDS could not be excluded. She had extensive blood work in the past that was unremarkable to explain the underlying etiology of her microcytic anemia except for the possibility of gastrointestinal blood loss but she had colonoscopy and upper endoscopy in July 2021 with removal of few polyps.  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 The patient is currently on oral iron  tablet with Integra +1 capsule p.o. daily.  The patient has been tolerating this treatment well except for intermittent constipation.    Iron  Deficiency Anemia   Chronic iron  deficiency anemia with a recent hemoglobin drop from 11.8 g/dL in June 2024 to 89.1 g/dL currently. She is on oral iron  supplementation (Integra Plus , one capsule daily) and has previously received iron  infusions (Venofer ). No current symptoms of fatigue, weakness, dizziness, or bleeding. Reports constipation as a side effect of the iron  supplement. Awaiting iron  study results to determine the  necessity of further iron  infusion. Discussed that hemoglobin is low but not critically low, and the option to continue oral iron  supplementation versus iron  infusion. She prefers to avoid infusion unless absolutely necessary.   - Continue oral iron  supplementation (Integra Plus , one capsule daily)   - Await results of iron  studies   - Consider iron  infusion if iron  studies indicate significant deficiency   - Follow up in six months.   She was advised to call immediately if she has any other concerning symptoms in the interval. The patient voices understanding of current disease status and treatment options and is in agreement with the current care plan.  All questions were answered. The patient knows to call the clinic with any problems, questions or concerns. We can certainly see the patient much sooner if necessary.  Disclaimer: This note was dictated with voice recognition software. Similar sounding words can inadvertently be transcribed and may not be corrected upon review.

## 2023-05-19 ENCOUNTER — Ambulatory Visit (INDEPENDENT_AMBULATORY_CARE_PROVIDER_SITE_OTHER): Payer: 59 | Admitting: Podiatry

## 2023-05-19 ENCOUNTER — Encounter: Payer: Self-pay | Admitting: Podiatry

## 2023-05-19 VITALS — Ht 66.0 in | Wt 288.0 lb

## 2023-05-19 DIAGNOSIS — B351 Tinea unguium: Secondary | ICD-10-CM | POA: Diagnosis not present

## 2023-05-19 DIAGNOSIS — E119 Type 2 diabetes mellitus without complications: Secondary | ICD-10-CM

## 2023-05-19 DIAGNOSIS — M79675 Pain in left toe(s): Secondary | ICD-10-CM | POA: Diagnosis not present

## 2023-05-19 DIAGNOSIS — M79674 Pain in right toe(s): Secondary | ICD-10-CM | POA: Diagnosis not present

## 2023-05-19 NOTE — Progress Notes (Signed)
  Subjective:  Patient ID: [name] Cooper, female    DOB: Jun 13, 1950,  MRN: [id number]  73 y.o. female presents preventative diabetic foot care and painful, elongated thickened toenails x 10 which are symptomatic when wearing enclosed shoe gear. This interferes with his/her daily activities.  No chief complaint on file.  New problem(s): None   PCP is Hillery Aldo, NP.  Allergies  Allergen Reactions   Lisinopril Anaphylaxis   Codeine     REACTION: MAKES HER SLEEPY   Review of Systems: Negative except as noted in the HPI.   Objective:  [name] Cooper is a pleasant 73 y.o. female morbidly obese in NAD. AAO x 3.  Vascular Examination: Vascular status intact b/l with palpable pedal pulses. CFT immediate b/l. Pedal hair present. No edema. No pain with calf compression b/l. Skin temperature gradient WNL b/l. No varicosities noted. No cyanosis or clubbing noted.  Neurological Examination: Sensation grossly intact b/l with 10 gram monofilament. Vibratory sensation intact b/l.  Dermatological Examination: Pedal skin with normal turgor, texture and tone b/l. No open wounds nor interdigital macerations noted. Toenails 1-5 b/l thick, discolored, elongated with subungual debris and pain on dorsal palpation. No hyperkeratotic lesions noted b/l.   Musculoskeletal Examination: Muscle strength 5/5 to b/l LE.  No pain, crepitus noted b/l. No gross pedal deformities. Patient ambulates independently without assistive aids.   Radiographs: None Last A1c:       No data to display           Assessment:   1. Pain due to onychomycosis of toenails of both feet   2. Diabetes mellitus without complication (HCC)    Plan:  Patient was evaluated and treated. All patient's and/or POA's questions/concerns addressed on today's visit. Mycotic toenails 1-5 debrided in length and girth without incident. Continue soft, supportive shoe gear daily. Report any pedal injuries to medical professional. Call  office if there are any quesitons/concerns. -Continue foot and shoe inspections daily. Monitor blood glucose per PCP/Endocrinologist's recommendations. -Patient/POA to call should there be question/concern in the interim.  Return in about 3 months (around 08/17/2023).  Freddie Breech, DPM      [facility] LOCATION: [address], [place] [address]                   Office [phone]   [facility] LOCATION: [address] [place], [place] [address] Office [phone]

## 2023-05-21 ENCOUNTER — Encounter: Payer: Self-pay | Admitting: Podiatry

## 2023-09-21 ENCOUNTER — Encounter: Payer: Self-pay | Admitting: Podiatry

## 2023-09-21 ENCOUNTER — Ambulatory Visit (INDEPENDENT_AMBULATORY_CARE_PROVIDER_SITE_OTHER): Payer: 59 | Admitting: Podiatry

## 2023-09-21 DIAGNOSIS — M2141 Flat foot [pes planus] (acquired), right foot: Secondary | ICD-10-CM

## 2023-09-21 DIAGNOSIS — B351 Tinea unguium: Secondary | ICD-10-CM | POA: Diagnosis not present

## 2023-09-21 DIAGNOSIS — M79674 Pain in right toe(s): Secondary | ICD-10-CM

## 2023-09-21 DIAGNOSIS — E119 Type 2 diabetes mellitus without complications: Secondary | ICD-10-CM

## 2023-09-21 DIAGNOSIS — M2142 Flat foot [pes planus] (acquired), left foot: Secondary | ICD-10-CM | POA: Diagnosis not present

## 2023-09-21 DIAGNOSIS — M79675 Pain in left toe(s): Secondary | ICD-10-CM

## 2023-09-26 NOTE — Progress Notes (Signed)
 ANNUAL DIABETIC FOOT EXAM  Subjective: [name] Cooper presents today for annual diabetic foot exam. Chief Complaint  Patient presents with   Diabetes    "Trim my toenails."  Margarete Sharps, NP - will see her on Thursday: A1c - 4.8   Patient confirms h/o diabetes.  Patient denies any h/o foot wounds.  Nguyen, Kim, NP is patient's PCP.  Past Medical History:  Diagnosis Date   Anemia    Arthritis    Cancer (HCC)    breast and ovarian   Chicken pox    Chronic lower back pain    COPD (chronic obstructive pulmonary disease) (HCC)    Diabetes (HCC)    GERD (gastroesophageal reflux disease)    Hepatitis 1970's   History of recurrent UTIs    Hyperlipemia    Hypertension    Slow heart rate dx'd 09/24/2014   Urine incontinence    Patient Active Problem List   Diagnosis Date Noted   Symptomatic anemia 12/09/2021   Coronary artery disease involving native coronary artery of native heart with angina pectoris (HCC) 03/03/2021   Localized swelling of forearm 12/11/2020   CKD (chronic kidney disease) stage 3, GFR 30-59 ml/min (HCC) 10/11/2019   Heme positive stool 09/14/2019   Iron  deficiency anemia 07/31/2019   Diabetes mellitus without complication (HCC) 07/12/2019   Dyspnea on exertion 01/13/2019   Morbid obesity (HCC) 10/12/2018   Former smoker 10/12/2018   Cough 10/11/2018   Microcytic anemia 06/15/2018   History of right mastectomy 06/15/2018   Vitamin D  deficiency 06/15/2018   Emphysema of lung (HCC) 06/15/2018   Tobacco dependence 06/15/2018   Urine, incontinence, stress female 06/15/2018   Estrogen deficiency 03/30/2016   Rhinitis, allergic    HTN (hypertension) 09/24/2014   Pure hypercholesterolemia 09/24/2014   OA (osteoarthritis) 09/24/2014   History of solitary pulmonary nodule 03/21/2008   History of right breast cancer 03/21/2008   Past Surgical History:  Procedure Laterality Date   ABDOMINAL HYSTERECTOMY  1984   APPENDECTOMY     BREAST BIOPSY Right 1996    BREAST LUMPECTOMY Right 1996   CYSTOSCOPY WITH URETEROSCOPY AND STENT PLACEMENT Left 09/18/2021   Procedure: CYSTOSCOPY WITH LEFT DIAGNOSTIC URETEROSCOPY, RETROGRADE PYELOGRAM, AND STENT PLACEMENT;  Surgeon: Andrez Banker, MD;  Location: [facility];  Service: Urology;  Laterality: Left;  75 MINS   LEFT HEART CATH AND CORONARY ANGIOGRAPHY N/A 01/13/2019   Procedure: LEFT HEART CATH AND CORONARY ANGIOGRAPHY;  Surgeon: Sammy Crisp, MD;  Location: [facility];  Service: Cardiovascular;  Laterality: N/A;   MASTECTOMY Right 1996   TONSILLECTOMY     Current Outpatient Medications on File Prior to Visit  Medication Sig Dispense Refill   acetaminophen  (TYLENOL ) 650 MG CR tablet Take 1,300 mg by mouth every 8 (eight) hours as needed for pain.     albuterol  (VENTOLIN  HFA) 108 (90 Base) MCG/ACT inhaler Inhale 2 puffs into the lungs every 6 (six) hours as needed for wheezing or shortness of breath. 1 each 3   Ascorbic Acid  (VITAMIN C) 500 MG CAPS Take 500 mg by mouth daily.     aspirin  EC 81 MG tablet Take 1 tablet (81 mg total) by mouth daily. 90 tablet 3   cholecalciferol (VITAMIN D3) 25 MCG (1000 UNIT) tablet Take 1,000 Units by mouth daily.     ferrous sulfate 325 (65 FE) MG EC tablet Take 325 mg by mouth daily at 12 noon.     furosemide (LASIX) 20 MG tablet Take 20 mg  by mouth daily as needed for fluid.     hydrALAZINE  (APRESOLINE ) 50 MG tablet Take 50 mg by mouth 2 (two) times daily.     potassium chloride (MICRO-K) 10 MEQ CR capsule Take 10 mEq by mouth every morning.     rosuvastatin  (CRESTOR ) 20 MG tablet Take 1 tablet by mouth once daily 21 tablet 1   RYBELSUS 3 MG TABS Take 1 tablet by mouth every morning.     ezetimibe  (ZETIA ) 10 MG tablet Take 1 tablet (10 mg total) by mouth daily. (Patient not taking: Reported on 09/21/2023) 90 tablet 3   HYDROcodone -acetaminophen  (NORCO/VICODIN) 5-325 MG tablet Take 1 tablet by mouth 2 (two) times daily as needed. (Patient not taking: Reported on  09/21/2023)     isosorbide  mononitrate (IMDUR ) 30 MG 24 hr tablet TAKE 1 TABLET BY MOUTH ONCE DAILY . APPOINTMENT REQUIRED FOR FUTURE REFILLS PLEASE  CALL  [phone] (Patient not taking: Reported on 09/21/2023) 15 tablet 0   nitroGLYCERIN  (NITROSTAT ) 0.4 MG SL tablet Place 1 tablet (0.4 mg total) under the tongue every 5 (five) minutes as needed for chest pain. 25 tablet prn   traMADol  (ULTRAM ) 50 MG tablet Take 1-2 tablets (50-100 mg total) by mouth every 6 (six) hours as needed for moderate pain. (Patient not taking: Reported on 09/21/2023) 15 tablet 0   No current facility-administered medications on file prior to visit.    Allergies  Allergen Reactions   Lisinopril Anaphylaxis   Codeine     REACTION: MAKES HER SLEEPY   Social History   Occupational History   Occupation: Retired     Associate Professor: [employer]  Tobacco Use   Smoking status: Every Day    Current packs/day: 0.25    Average packs/day: 0.3 packs/day for 56.0 years (14.0 ttl pk-yrs)    Types: Cigarettes   Smokeless tobacco: Never  Vaping Use   Vaping status: Never Used  Substance and Sexual Activity   Alcohol use: No    Alcohol/week: 0.0 standard drinks of alcohol   Drug use: No    Types: "Crack" cocaine    Comment: last crack was about 8 years ago   Sexual activity: Not on file   Family History  Problem Relation Age of Onset   Cancer Mother    Early death Mother    Arthritis Brother    Colon cancer Neg Hx    Esophageal cancer Neg Hx    Rectal cancer Neg Hx    Stomach cancer Neg Hx    Immunization History  Administered Date(s) Administered   Fluad Quad(high Dose 65+) 02/06/2019   Influenza,inj,Quad PF,6+ Mos 03/17/2016, 12/22/2016, 12/22/2017   Influenza-Unspecified 03/18/2020   PFIZER(Purple Top)SARS-COV-2 Vaccination 07/06/2019, 08/01/2019, 03/18/2020   Pneumococcal Conjugate-13 03/17/2016   Pneumococcal Polysaccharide-23 09/25/2014     Review of Systems: Negative except as noted in the HPI.    Objective: There were no vitals filed for this visit.  [name] Cooper is a pleasant 73 y.o. female in NAD. AAO X 3.  Diabetic foot exam was performed with the following findings:   Vascular Examination: Capillary refill time immediate b/l. Vascular status intact b/l with palpable pedal pulses. Pedal hair present b/l. No pain with calf compression b/l. Skin temperature gradient WNL b/l. No cyanosis or clubbing b/l. No ischemia or gangrene noted b/l. Nonpitting edema noted BLE.  Neurological Examination: Sensation grossly intact b/l with 10 gram monofilament. Vibratory sensation intact b/l.   Dermatological Examination: Pedal skin with normal turgor, texture and tone b/l.  No open wounds. No interdigital macerations.   Toenails 1-5 b/l thick, discolored, elongated with subungual debris and pain on dorsal palpation.   No corns, calluses nor porokeratotic lesions noted.  Musculoskeletal Examination: Muscle strength 5/5 to all lower extremity muscle groups bilaterally. Pes planus deformity noted bilateral LE.  Radiographs: None     Lab Results  Component Value Date   HGBA1C 5.4 09/09/2021   ADA Risk Categorization: Low Risk :  Patient has all of the following: Intact protective sensation No prior foot ulcer  No severe deformity Pedal pulses present  Assessment: 1. Pain due to onychomycosis of toenails of both feet   2. Pes planus of both feet   3. Diabetes mellitus without complication (HCC)   4. Encounter for diabetic foot exam (HCC)     Plan: Diabetic foot examination performed today. All patient's and/or POA's questions/concerns addressed on today's visit. Toenails 1-5 debrided in length and girth without incident. Continue foot and shoe inspections daily. Monitor blood glucose per PCP/Endocrinologist's recommendations. Continue soft, supportive shoe gear daily. Report any pedal injuries to medical professional. Call office if there are any  questions/concerns. -Patient/POA to call should there be question/concern in the interim. Return in about 3 months (around 12/22/2023).  [name] Cooper, DPM      [facility] LOCATION: [address] [place] [address]                   Office [phone]   [facility] LOCATION: [address] [place], [place] [address] Office [phone]

## 2023-09-27 ENCOUNTER — Telehealth: Payer: Self-pay | Admitting: Cardiology

## 2023-09-27 ENCOUNTER — Ambulatory Visit
Admission: RE | Admit: 2023-09-27 | Discharge: 2023-09-27 | Disposition: A | Discharge status: Home / Self Care | Source: Ambulatory Visit | Attending: Registered Nurse | Admitting: Registered Nurse

## 2023-09-27 ENCOUNTER — Other Ambulatory Visit: Payer: Self-pay | Admitting: Registered Nurse

## 2023-09-27 DIAGNOSIS — I272 Pulmonary hypertension, unspecified: Secondary | ICD-10-CM

## 2023-09-27 NOTE — Telephone Encounter (Signed)
  Pt is requesting to switch from Dr. Renna Cary to Dr. Filiberto Hug

## 2023-09-28 NOTE — Telephone Encounter (Signed)
 Same

## 2023-09-30 ENCOUNTER — Other Ambulatory Visit: Payer: Self-pay

## 2023-09-30 ENCOUNTER — Inpatient Hospital Stay (HOSPITAL_COMMUNITY)
Admission: EM | Admit: 2023-09-30 | Discharge: 2023-10-04 | DRG: 379 | Disposition: A | Discharge status: Home / Self Care | Source: Ambulatory Visit | Attending: Family Medicine | Admitting: Family Medicine

## 2023-09-30 ENCOUNTER — Emergency Department (HOSPITAL_COMMUNITY)

## 2023-09-30 ENCOUNTER — Encounter (HOSPITAL_COMMUNITY): Payer: Self-pay | Admitting: Family Medicine

## 2023-09-30 DIAGNOSIS — Z853 Personal history of malignant neoplasm of breast: Secondary | ICD-10-CM

## 2023-09-30 DIAGNOSIS — Z90710 Acquired absence of both cervix and uterus: Secondary | ICD-10-CM

## 2023-09-30 DIAGNOSIS — J449 Chronic obstructive pulmonary disease, unspecified: Secondary | ICD-10-CM | POA: Diagnosis present

## 2023-09-30 DIAGNOSIS — I129 Hypertensive chronic kidney disease with stage 1 through stage 4 chronic kidney disease, or unspecified chronic kidney disease: Secondary | ICD-10-CM | POA: Diagnosis present

## 2023-09-30 DIAGNOSIS — K922 Gastrointestinal hemorrhage, unspecified: Secondary | ICD-10-CM | POA: Diagnosis not present

## 2023-09-30 DIAGNOSIS — Z955 Presence of coronary angioplasty implant and graft: Secondary | ICD-10-CM

## 2023-09-30 DIAGNOSIS — R195 Other fecal abnormalities: Secondary | ICD-10-CM | POA: Diagnosis not present

## 2023-09-30 DIAGNOSIS — R600 Localized edema: Secondary | ICD-10-CM | POA: Diagnosis present

## 2023-09-30 DIAGNOSIS — Z79899 Other long term (current) drug therapy: Secondary | ICD-10-CM

## 2023-09-30 DIAGNOSIS — Z8543 Personal history of malignant neoplasm of ovary: Secondary | ICD-10-CM

## 2023-09-30 DIAGNOSIS — E1122 Type 2 diabetes mellitus with diabetic chronic kidney disease: Secondary | ICD-10-CM | POA: Diagnosis present

## 2023-09-30 DIAGNOSIS — K2100 Gastro-esophageal reflux disease with esophagitis, without bleeding: Secondary | ICD-10-CM | POA: Diagnosis present

## 2023-09-30 DIAGNOSIS — Z9011 Acquired absence of right breast and nipple: Secondary | ICD-10-CM

## 2023-09-30 DIAGNOSIS — Z789 Other specified health status: Secondary | ICD-10-CM

## 2023-09-30 DIAGNOSIS — K31811 Angiodysplasia of stomach and duodenum with bleeding: Secondary | ICD-10-CM | POA: Diagnosis not present

## 2023-09-30 DIAGNOSIS — N1832 Chronic kidney disease, stage 3b: Secondary | ICD-10-CM | POA: Diagnosis present

## 2023-09-30 DIAGNOSIS — Z6841 Body Mass Index (BMI) 40.0 and over, adult: Secondary | ICD-10-CM

## 2023-09-30 DIAGNOSIS — F17210 Nicotine dependence, cigarettes, uncomplicated: Secondary | ICD-10-CM | POA: Diagnosis present

## 2023-09-30 DIAGNOSIS — Z860101 Personal history of adenomatous and serrated colon polyps: Secondary | ICD-10-CM

## 2023-09-30 DIAGNOSIS — D649 Anemia, unspecified: Secondary | ICD-10-CM | POA: Diagnosis not present

## 2023-09-30 DIAGNOSIS — D509 Iron deficiency anemia, unspecified: Secondary | ICD-10-CM | POA: Diagnosis not present

## 2023-09-30 DIAGNOSIS — K5521 Angiodysplasia of colon with hemorrhage: Secondary | ICD-10-CM | POA: Diagnosis present

## 2023-09-30 DIAGNOSIS — Z66 Do not resuscitate: Secondary | ICD-10-CM | POA: Diagnosis present

## 2023-09-30 DIAGNOSIS — Z87440 Personal history of urinary (tract) infections: Secondary | ICD-10-CM

## 2023-09-30 DIAGNOSIS — E785 Hyperlipidemia, unspecified: Secondary | ICD-10-CM | POA: Diagnosis present

## 2023-09-30 DIAGNOSIS — Z7982 Long term (current) use of aspirin: Secondary | ICD-10-CM

## 2023-09-30 DIAGNOSIS — K449 Diaphragmatic hernia without obstruction or gangrene: Secondary | ICD-10-CM | POA: Diagnosis present

## 2023-09-30 DIAGNOSIS — I2584 Coronary atherosclerosis due to calcified coronary lesion: Secondary | ICD-10-CM | POA: Diagnosis present

## 2023-09-30 DIAGNOSIS — R82998 Other abnormal findings in urine: Secondary | ICD-10-CM | POA: Insufficient documentation

## 2023-09-30 DIAGNOSIS — I2510 Atherosclerotic heart disease of native coronary artery without angina pectoris: Secondary | ICD-10-CM | POA: Diagnosis present

## 2023-09-30 DIAGNOSIS — D500 Iron deficiency anemia secondary to blood loss (chronic): Secondary | ICD-10-CM | POA: Diagnosis present

## 2023-09-30 DIAGNOSIS — E6609 Other obesity due to excess calories: Secondary | ICD-10-CM | POA: Diagnosis present

## 2023-09-30 LAB — URINALYSIS, W/ REFLEX TO CULTURE (INFECTION SUSPECTED)
Bilirubin Urine: NEGATIVE
Glucose, UA: NEGATIVE mg/dL
Hgb urine dipstick: NEGATIVE
Ketones, ur: NEGATIVE mg/dL
Nitrite: NEGATIVE
Protein, ur: NEGATIVE mg/dL
Specific Gravity, Urine: 1.008 (ref 1.005–1.030)
pH: 7 (ref 5.0–8.0)

## 2023-09-30 LAB — CBC
HCT: 27.4 % — ABNORMAL LOW (ref 36.0–46.0)
Hemoglobin: 7.5 g/dL — ABNORMAL LOW (ref 12.0–15.0)
MCH: 17.3 pg — ABNORMAL LOW (ref 26.0–34.0)
MCHC: 27.4 g/dL — ABNORMAL LOW (ref 30.0–36.0)
MCV: 63.3 fL — ABNORMAL LOW (ref 80.0–100.0)
Platelets: 232 10*3/uL (ref 150–400)
RBC: 4.33 MIL/uL (ref 3.87–5.11)
RDW: 20.3 % — ABNORMAL HIGH (ref 11.5–15.5)
WBC: 7.8 10*3/uL (ref 4.0–10.5)
nRBC: 0.4 % — ABNORMAL HIGH (ref 0.0–0.2)

## 2023-09-30 LAB — IRON AND TIBC
Iron: 16 ug/dL — ABNORMAL LOW (ref 28–170)
Saturation Ratios: 4 % — ABNORMAL LOW (ref 10.4–31.8)
TIBC: 396 ug/dL (ref 250–450)
UIBC: 380 ug/dL

## 2023-09-30 LAB — BRAIN NATRIURETIC PEPTIDE: B Natriuretic Peptide: 37.8 pg/mL (ref 0.0–100.0)

## 2023-09-30 LAB — MAGNESIUM: Magnesium: 1.9 mg/dL (ref 1.7–2.4)

## 2023-09-30 LAB — BASIC METABOLIC PANEL WITH GFR
Anion gap: 9 (ref 5–15)
BUN: 13 mg/dL (ref 8–23)
CO2: 29 mmol/L (ref 22–32)
Calcium: 9 mg/dL (ref 8.9–10.3)
Chloride: 105 mmol/L (ref 98–111)
Creatinine, Ser: 1.31 mg/dL — ABNORMAL HIGH (ref 0.44–1.00)
GFR, Estimated: 43 mL/min — ABNORMAL LOW (ref >60)
Glucose, Bld: 99 mg/dL (ref 70–99)
Potassium: 3.8 mmol/L (ref 3.5–5.1)
Sodium: 143 mmol/L (ref 135–145)

## 2023-09-30 LAB — TROPONIN I (HIGH SENSITIVITY)
Troponin I (High Sensitivity): 6 ng/L (ref <18)
Troponin I (High Sensitivity): 7 ng/L (ref <18)

## 2023-09-30 LAB — FERRITIN: Ferritin: 3 ng/mL — ABNORMAL LOW (ref 11–307)

## 2023-09-30 LAB — POC OCCULT BLOOD, ED: Fecal Occult Bld: POSITIVE — AB

## 2023-09-30 LAB — PREPARE RBC (CROSSMATCH)

## 2023-09-30 LAB — GLUCOSE, CAPILLARY: Glucose-Capillary: 104 mg/dL — ABNORMAL HIGH (ref 70–99)

## 2023-09-30 MED ORDER — SODIUM CHLORIDE 0.9% IV SOLUTION: Freq: Once | INTRAVENOUS | Status: AC

## 2023-09-30 MED ORDER — ACETAMINOPHEN 650 MG RE SUPP: 650.0000 mg | Freq: Four times a day (QID) | RECTAL | Status: DC | PRN

## 2023-09-30 MED ORDER — FUROSEMIDE 40 MG PO TABS
40.0000 mg | ORAL_TABLET | Freq: Every day | ORAL | Status: DC
  Administered 2023-09-30 – 2023-10-04 (×3): 40 mg via ORAL
  Filled 2023-09-30 (×5): qty 1

## 2023-09-30 MED ORDER — HYDRALAZINE HCL 50 MG PO TABS
50.0000 mg | ORAL_TABLET | Freq: Two times a day (BID) | ORAL | Status: DC
  Administered 2023-09-30 – 2023-10-04 (×5): 50 mg via ORAL
  Filled 2023-09-30 (×8): qty 1

## 2023-09-30 MED ORDER — GABAPENTIN 300 MG PO CAPS
300.0000 mg | ORAL_CAPSULE | Freq: Every day | ORAL | Status: DC
  Administered 2023-09-30 – 2023-10-03 (×2): 300 mg via ORAL
  Filled 2023-09-30 (×4): qty 1

## 2023-09-30 MED ORDER — FUROSEMIDE 10 MG/ML IJ SOLN
40.0000 mg | Freq: Once | INTRAMUSCULAR | Status: AC
Start: 2023-09-30 — End: 2023-09-30
  Administered 2023-09-30: 40 mg via INTRAVENOUS
  Filled 2023-09-30: qty 4

## 2023-09-30 MED ORDER — PANTOPRAZOLE SODIUM 40 MG IV SOLR
40.0000 mg | Freq: Two times a day (BID) | INTRAVENOUS | Status: DC
  Administered 2023-09-30: 40 mg via INTRAVENOUS
  Filled 2023-09-30 (×5): qty 10

## 2023-09-30 MED ORDER — FLUTICASONE FUROATE-VILANTEROL 100-25 MCG/ACT IN AEPB
1.0000 | INHALATION_SPRAY | Freq: Every day | RESPIRATORY_TRACT | Status: DC
  Administered 2023-10-03 – 2023-10-04 (×2): 1 via RESPIRATORY_TRACT
  Filled 2023-09-30: qty 28

## 2023-09-30 MED ORDER — ALBUTEROL SULFATE (2.5 MG/3ML) 0.083% IN NEBU: 2.5000 mg | INHALATION_SOLUTION | Freq: Four times a day (QID) | RESPIRATORY_TRACT | Status: DC | PRN

## 2023-09-30 MED ORDER — ACETAMINOPHEN 325 MG PO TABS
650.0000 mg | ORAL_TABLET | Freq: Four times a day (QID) | ORAL | Status: DC | PRN
  Filled 2023-09-30: qty 2

## 2023-09-30 MED ORDER — ROSUVASTATIN CALCIUM 20 MG PO TABS
20.0000 mg | ORAL_TABLET | Freq: Every day | ORAL | Status: DC
  Administered 2023-09-30 – 2023-10-04 (×3): 20 mg via ORAL
  Filled 2023-09-30 (×5): qty 1

## 2023-09-30 MED ORDER — ISOSORBIDE MONONITRATE ER 30 MG PO TB24
30.0000 mg | ORAL_TABLET | Freq: Every day | ORAL | Status: DC
  Administered 2023-10-03 – 2023-10-04 (×2): 30 mg via ORAL
  Filled 2023-09-30 (×4): qty 1

## 2023-09-30 MED ORDER — NICOTINE 7 MG/24HR TD PT24: 7.0000 mg | MEDICATED_PATCH | Freq: Every day | TRANSDERMAL | Status: DC | PRN

## 2023-09-30 MED ORDER — EZETIMIBE 10 MG PO TABS
10.0000 mg | ORAL_TABLET | Freq: Every day | ORAL | Status: DC
  Administered 2023-09-30 – 2023-10-04 (×3): 10 mg via ORAL
  Filled 2023-09-30 (×5): qty 1

## 2023-09-30 NOTE — Plan of Care (Signed)

## 2023-09-30 NOTE — Assessment & Plan Note (Addendum)
 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  Hemoccult positive.  Follows outpatient with hematology, last iron  infusion ~6 months ago.  - Admit to [facility]MTS, attending Dr. Dawn Eth - Med-Surg, Vital signs per floor - Heart diet  - VTE prophylaxis: SCDs, since currently anemic and hemoccult positive - Iron , ferritin, TIBC - Echo - Lasix 40 mg daily - IV Protonix  BID - Type and Screen & Transfuse 1u PRBC - Transfusion threshold < 8 with hx of CAD - AM CBC/BMP, A1c - GI consulted, appreciate recommendations - PT/OT eval and treat - 2 large bore IVs

## 2023-09-30 NOTE — Assessment & Plan Note (Addendum)
 Concern for urinary retention d/t hx of UOP x1 daily even on Lasix.  No complaints of dysuria, urgency, or frequency.  Hx of cystoscopy with urethroscopy and stent placement 2023. - UA and culture to rule out infection - Bladder scan once > repeat if PVR > 300; if elevated x3, consider foley catheter

## 2023-09-30 NOTE — Hospital Course (Addendum)
 [name] Cooper is a 73 y.o.female with a history of CAD, , who was admitted to the [facility] Medicine Teaching Service at [facility] for symptomatic anemia with possible GI bleed***. Her hospital course is detailed below:  Acute on chronic anemia: Admitted for exertional dyspnea for few weeks and history of small bowel AVMs.  Hemoglobin on admission 7.5.  In the ED she was Hemoccult positive.  GI was consulted and recommended inpatient EGD.  Iron  studies were obtained and showed iron  deficiency anemia most likely secondary to blood loss.  EGD was performed on 10/02/2023 and showed multiple nonbleeding AVMs in the gastric body, duodenum, and jejunum.  Hemoglobin remained stable at***over admission without evidence of overt bleeding.  Dyspnea on exertion: Echo was obtained in addition to anemia workup.  Reassuringly EKG and troponins were within normal limits.  Echo completed on***and showed***.   Other chronic conditions were medically managed with home medications and formulary alternatives as necessary (***)  PCP Follow-up Recommendations: Continue PPI BID until end of June per GI Recheck iron  panel in 4-8 weeks Consider starting p.o. iron  with vitamin C supplement (dietary or Rx) pending follow-up iron  panel Follow up with nephrology given low UOP  Provide inhaler use education

## 2023-09-30 NOTE — Assessment & Plan Note (Addendum)
 CAD: Left heart cath in 2020 revealed significant calcification of 80% in the mid LAD with stenosis, mild nonobstructive CAD involving the LCx and RCA with normal left ventricular systolic function and filling pressure.  T2DM: Previously elevated  HTN: Home meds include hydralazine  50 mg BID and Imdur  30 mg daily HLD: Continue Ezetimibe  10 mg daily and Crestor  20 mg daily COPD: On Breo Ellipta and albuterol  at home, but not taking the Breo since she doesn't know how to use it

## 2023-09-30 NOTE — ED Triage Notes (Signed)
 Pt. Stated, [name] Cooper had both feet swelling for 3 weeks and I dont go to the bathroom but about one time a day. I have COPD. So Im having SOB a lot. Denies any chest pain, N/V

## 2023-09-30 NOTE — ED Notes (Signed)
 Pt transported to xray

## 2023-09-30 NOTE — ED Notes (Signed)
 Unable to obtain labs

## 2023-09-30 NOTE — H&P (Addendum)
 Hospital Admission History and Physical Service Pager: [phone]  Patient name: [name] Cooper Medical record number: [id number] Date of Birth: March 03, 1951 Age: 73 y.o. Gender: female  Primary Care Provider: Hershell Lose, NP Consultants: GI Code Status: DNR, confirmed with patient and family Preferred Emergency Contact:  Contact Information     Name Relation Home Work Mobile   Siler,Dorothy Daughter   [phone]   Sims,George Brother [phone]  [phone]   Chief Complaint: Dyspnea  Assessment and Plan: [name] Cooper is a 73 y.o. female with PMH of CAD, IDA, COPD, HTN, HLD, small bowel AVMs, reflux esophagitis, adenomatous colon polyps presenting with dyspnea likely secondary to symptomatic anemia 2/2 GI bleed. Differential for this patient's presentation of this includes: Symptomatic anemia likely 2/2 GI bleed: Most likely due to GI history of small bowel AVMs, tubular adenomas, and Hemoccult positive in ED. Possible new onset congestive heart failure: Possible with dyspnea and edema in lower extremities, however BNP within normal limits.  TTE in 2009 revealed normal cardiac function with an EF of 65%.  Repeat echo was recommended by cardiology and low-dose metoprolol was started, with known history of CAD without angina. PNA: Unlikely no focal consolidations noted on CXR Assessment & Plan Symptomatic anemia Hx of IDA, small bowel AVMs and tubular adenomas in the past.  Hemoccult positive.  Follows outpatient with hematology, last iron  infusion ~6 months ago.  - Admit to FMTS, attending Dr. Dawn Eth - Med-Surg, Vital signs per floor - Heart diet  - VTE prophylaxis: SCDs, since currently anemic and hemoccult positive - Iron , ferritin, TIBC - Echo - Lasix 40 mg daily - IV Protonix  BID - Type and Screen & Transfuse 1u PRBC - Transfusion threshold < 8 with hx of CAD - AM CBC/BMP, A1c - GI consulted, appreciate recommendations - PT/OT eval and treat - 2 large bore  IVs Amber-colored urine Concern for urinary retention d/t hx of UOP x1 daily even on Lasix.  No complaints of dysuria, urgency, or frequency.  Hx of cystoscopy with urethroscopy and stent placement 2023. - UA and culture to rule out infection - Bladder scan once > repeat if PVR > 300; if elevated x3, consider foley catheter Chronic health problem CAD: Left heart cath in 2020 revealed significant calcification of 80% in the mid LAD with stenosis, mild nonobstructive CAD involving the LCx and RCA with normal left ventricular systolic function and filling pressure.  T2DM: Previously elevated  HTN: Home meds include hydralazine  50 mg BID and Imdur  30 mg daily HLD: Continue Ezetimibe  10 mg daily and Crestor  20 mg daily COPD: On Breo Ellipta and albuterol  at home, but not taking the Breo since she doesn't know how to use it   FEN/GI: Carb modified diet VTE Prophylaxis: SCDs  Disposition: Med-Surg  History of Present Illness:  [name] Cooper is a 73 y.o. female presenting with shortness of breath with exertion and peripheral edema for [redacted] weeks along with fatigue.  Patient reports she has been taking her Lasix 40 mg daily.  Patient denies hematochezia, but has chronic dark stools likely in the setting of taking oral iron .  Patient is having SOB only when she walks a long way; however, it has gotten worse than her baseline. She feels this is due to her COPD. She was with family this morning at her PCP who were wanting to have more medications to help with her swelling. Family and patient are adamant to find the source of her leg swelling. She also noted that she  had tea-colored urine at the doctor's office, and they recommended she come to the ED.  She denies chest pain, headaches, blurry vision, N/V/D/C, acid reflux. She has no dysuria or odd urine smell. No urinary frequency. She does feel she has difficulty urinating. She does have a history of polyps on her recent colonoscopy; however, she feels her  stool is dark due to iron . No bright red blood in the stool.   In the ED, patient presented with LLE edema and dyspnea.  VSS.  FOBT positive.  CBC with hemoglobin of 7.8, baseline ~10-11, hematocrit 27.4, MCV 63.3, with no leukocytosis.  BMP revealed elevated creatinine to 1.31 with a GFR 43 demonstrating CKD stage IIIb.  BNP 37.8.  Troponin 6.  Mag 1.9.  CXR revealed no acute cardiopulmonary disease.   Review Of Systems: Per HPI  Pertinent Past Medical History: Anemia Breast and ovarian cancer PD T2DM, controlled GERD Hepatitis HLD HTN CAD Adenomatous colon polys - small TA in 2021 CHF Remainder reviewed in history tab.   Pertinent Past Surgical History: Abdominal hysterectomy Appendectomy Breast lumpectomy and biopsy Cystoscopy with urethroscopy and stent placement 2023 Left heart cath and coronary angiography 2020 (though patient has no recollection of this) Mastectomy Remainder reviewed in history tab.   Pertinent Social History: Tobacco use: 5 cigarettes per day for over 30 years Alcohol use: None Other Substance use: None Lives with her brother  Pertinent Family History: Mother: Deceased; cancer Father: Deceased Brother: Arthritis Remainder reviewed in history tab.   Important Outpatient Medications: Albuterol  every 6 hours as needed for wheezing or shortness of breath ASA 81 mg daily Breo Ellipta daily Vitamin D3 1000 unit daily Flexeril 10 mg twice daily as needed for muscle spasms Zetia  10 mg daily Ferrous sulfate 325 mg daily at noon Lasix 40 mg daily for fluid Gabapentin 300 mg at bedtime Hydralazine  50 mg twice daily Hydrocodone -acetaminophen  5-325 mg 1 tablet twice daily as needed (not taking) Imdur  30 mg daily Nitrostat  0.4 mg SL every 5 minutes as needed (not taking) Potassium chloride 10 mill equivalent every morning Crestor  20 mg daily Rybelsus 30 mg daily Tramadol  50 mg every 6 hours as needed for pain (not taking) ** last took meds  yesterday ** Remainder reviewed in medication history.   Objective: BP (!) 142/72 (BP Location: Right Arm)   Pulse 61   Temp 98.1 F (36.7 C) (Oral)   Resp 18   Ht 5' 6 (1.676 m)   Wt 135.6 kg   LMP  (LMP Unknown)   SpO2 98%   BMI 48.26 kg/m  Exam: General: Awake and Alert in NAD HEENT: NCAT. Sclera anicteric. No rhinorrhea. Cardiovascular: RRR. No M/R/G Respiratory: CTAB, normal WOB on RA. No wheezing, crackles, rhonchi, or diminished breath sounds. Abdomen: Soft, non-tender, non-distended. Bowel sounds normoactive Extremities: Able to move all extremities. 2+ BLE edema up to knees, no deformities or significant joint findings. Skin: Warm and dry. No abrasions or rashes noted. Neuro: A&Ox3. No focal neurological deficits.  Labs:  CBC BMET  Recent Labs  Lab 09/30/23 1242  WBC 7.8  HGB 7.5*  HCT 27.4*  PLT 232   Recent Labs  Lab 09/30/23 1242  NA 143  K 3.8  CL 105  CO2 29  BUN 13  CREATININE 1.31*  GLUCOSE 99  CALCIUM  9.0     BNP: 37.8 Mag: 1.9  EKG: My own interpretation -regular rate and rhythm with PACs.  No ST elevation.  No QTc prolongation.  Imaging Studies Performed:  CXR Impression from radiologist: No acute cardiopulmonary disease  My Interpretation: Lungs are clear.  No cardiomegaly noted.  No effusions noted in periphery.  No hyperdensity or focal consolidations noted.  Clyda Dark, DO 09/30/2023, 6:58 PM PGY-1, [facility]  FPTS Intern pager: [phone], text pages welcome Secure chat group [facility] Teaching Service   I agree with the assessment and plan as documented above.  Genetta Kenning, MD PGY-2, [facility]

## 2023-09-30 NOTE — Consult Note (Addendum)
 Consultation Note   Referring Provider:  Emergency services PCP: Hershell Lose, NP Primary Gastroenterologist:   Legrand Puma, MD       Reason for Consultation:  Anemia, FOBT+ DOA: 09/30/2023         Hospital Day: 1   Attending physician's note  I personally saw the patient and performed a substantive portion of this encounter (>50% time spent), including a complete performance of at least one of the key components (MDM, Hx and/or Exam), in conjunction with the APP.  I agree with the APP's note, impression, and recommendations with additional input as follows.     73 year old female with chronic iron  deficiency anemia, history of small bowel AVM admitted with lower extremity edema and shortness of breath, noted to have slight decline in hemoglobin  Denies any hematochezia, has chronic dark stools in the setting of oral iron  Decline in hemoglobin could be secondary to volume overload and hemodilution Follow-up iron  studies Monitor hemoglobin and transfuse if below 7  GI will continue to follow  The patient was provided an opportunity to ask questions and all were answered. The patient agreed with the plan and demonstrated an understanding of the instructions.  Lorena Rolling , MD [phone]    ASSESSMENT    Acute on chronic anemia ( iron  deficiency) FOBT+ History of small bowel AVMs  Followed outpatient by Hematology.  Extensive workup for etiology unrevealing. Last iron  infusion ~ 6 months ago. Presenting hgb 7.5, (down from 10.8 in January). Stools black on iron . May have occult bleeding from small bowel AVMs or erosive disease.    Poor venous access.  Needs IV. The IV team is trying now to get access  History of reflux esophagitis Not on acid reducing medication   History of adenomatous colon polyps Small TA in 2021  COPD  DM On Semaglutide    See PMH for additional history    PLAN:   --May need EGD this admission  (when medically stable). Await admitting service's workup of edema. The [facility] may be anemia related.  --Iron  studies (taking into account that she takes oral iron ). If iron  deficient then needs IV iron   --Trend H/H. Transfuse for hgb <7 or at discretion of admitting team.  --BID IV pantoprazole .   HPI   73 y.o. year old female with a medical history including but not limited to COPD, iron  deficiency anemia, small bowel AVMs , reflux esophagitis, adenomatous colon polyps,  HTN, obesity, DM, CAD s/p stent placement in 2020.   Patient presented to the ED today for evaluation of swelling in both of her feet and shortness of breath over the last 3 weeks. SHOB not necessarily new as she has COPD. CXR unremarkable. BNP normal. Presenting hgb 7.5, (down from 10.8 in January). Stools black on iron . No red blood in stool. No focal GI symptoms such as abdominal pain, N/V. No hematuria, vaginal bleeding, epistaxis or other overt bleeding. She takes a daily baby asa, denies use of any other NSAIDs. Follows with Hematology, last iron  infusion about 6 months ago.   Labs and Imaging:  Recent Labs    09/30/23 1242  WBC 7.8  HGB 7.5*  HCT 27.4*  MCV 63.3*  PLT 232   Recent  Labs    09/30/23 1242  NA 143  K 3.8  CL 105  CO2 29  GLUCOSE 99  BUN 13  CREATININE 1.31*  CALCIUM  9.0     DG Chest 2 View CLINICAL DATA:  SOB  EXAM: CHEST - 2 VIEW  COMPARISON:  09/27/2023  FINDINGS: Lungs are clear.  Borderline hyperinflation.  Heart size and mediastinal contours are within normal limits.  No effusion.  Surgical clips right axilla.  IMPRESSION: No acute cardiopulmonary disease.  Electronically Signed   By: Nicoletta Barrier M.D.   On: 09/30/2023 11:31   Pertinent GI Studies   July 2021 EGD and colonoscopy for IDA 1. Reflux esophagitis 2. Hiatal hernia without erosions 3. Diminutive duodenal AVMs. Potential cause for iron  deficiency anemia.  Colonoscopy  - Two 3 to 5 mm polyps in the  sigmoid colon and in the descending colon, removed with a cold snare. Resected and retrieved. - Fixed rectosigmoid stenosis from adhesive disease requiring pediatric colonoscope to complete examination. - The examination was otherwise normal on direct and retroflexion views. - EGD today. Please see report  Diagnosis 1. Surgical [P], colon, sigmoid, polyp - HYPERPLASTIC POLYP. - NO DYSPLASIA OR MALIGNANCY. 2. Surgical [P], colon, descending, polyp - TUBULAR ADENOMA. - NO HIGH GRADE DYSPLASIA OR MALIGNANCY  Past Medical History:  Diagnosis Date   Anemia    Arthritis    Cancer (HCC)    breast and ovarian   Chicken pox    Chronic lower back pain    COPD (chronic obstructive pulmonary disease) (HCC)    Diabetes (HCC)    GERD (gastroesophageal reflux disease)    Hepatitis 1970's   History of recurrent UTIs    Hyperlipemia    Hypertension    Slow heart rate dx'd 09/24/2014   Urine incontinence     Past Surgical History:  Procedure Laterality Date   ABDOMINAL HYSTERECTOMY  1984   APPENDECTOMY     BREAST BIOPSY Right 1996   BREAST LUMPECTOMY Right 1996   CYSTOSCOPY WITH URETEROSCOPY AND STENT PLACEMENT Left 09/18/2021   Procedure: CYSTOSCOPY WITH LEFT DIAGNOSTIC URETEROSCOPY, RETROGRADE PYELOGRAM, AND STENT PLACEMENT;  Surgeon: Andrez Banker, MD;  Location: [facility];  Service: Urology;  Laterality: Left;  75 MINS   LEFT HEART CATH AND CORONARY ANGIOGRAPHY N/A 01/13/2019   Procedure: LEFT HEART CATH AND CORONARY ANGIOGRAPHY;  Surgeon: Sammy Crisp, MD;  Location: [facility] INVASIVE CV LAB;  Service: Cardiovascular;  Laterality: N/A;   MASTECTOMY Right 1996   TONSILLECTOMY      Family History  Problem Relation Age of Onset   Cancer Mother    Early death Mother    Arthritis Brother    Colon cancer Neg Hx    Esophageal cancer Neg Hx    Rectal cancer Neg Hx    Stomach cancer Neg Hx     Prior to Admission medications   Medication Sig Start Date End Date Taking? Authorizing  Provider  acetaminophen  (TYLENOL ) 650 MG CR tablet Take 1,300 mg by mouth every 8 (eight) hours as needed for pain.    [provider]  albuterol  (VENTOLIN  HFA) 108 (90 Base) MCG/ACT inhaler Inhale 2 puffs into the lungs every 6 (six) hours as needed for wheezing or shortness of breath. 07/08/20   Raymona Caldwell, MD  Ascorbic Acid  (VITAMIN C) 500 MG CAPS Take 500 mg by mouth daily.    [provider]  aspirin  EC 81 MG tablet Take 1 tablet (81 mg total) by mouth daily. 01/11/19  Hugh Madura, MD  BREO ELLIPTA 100-25 MCG/ACT AEPB Inhale 1 puff into the lungs daily. 08/13/23   [provider]  cholecalciferol (VITAMIN D3) 25 MCG (1000 UNIT) tablet Take 1,000 Units by mouth daily.    [provider]  cyclobenzaprine (FLEXERIL) 10 MG tablet Take 10 mg by mouth 2 (two) times daily as needed for muscle spasms. 06/01/23   [provider]  ezetimibe  (ZETIA ) 10 MG tablet Take 1 tablet (10 mg total) by mouth daily. Patient not taking: Reported on 09/21/2023 03/03/21   Hugh Madura, MD  ferrous sulfate 325 (65 FE) MG EC tablet Take 325 mg by mouth daily at 12 noon.    [provider]  furosemide (LASIX) 20 MG tablet Take 20 mg by mouth daily as needed for fluid. 08/29/21   [provider]  gabapentin (NEURONTIN) 300 MG capsule Take 300 mg by mouth at bedtime. 08/26/23   [provider]  hydrALAZINE  (APRESOLINE ) 50 MG tablet Take 50 mg by mouth 2 (two) times daily. 08/25/23   [provider]  HYDROcodone -acetaminophen  (NORCO/VICODIN) 5-325 MG tablet Take 1 tablet by mouth 2 (two) times daily as needed. Patient not taking: Reported on 09/21/2023 05/22/22   [provider]  isosorbide  mononitrate (IMDUR ) 30 MG 24 hr tablet TAKE 1 TABLET BY MOUTH ONCE DAILY . APPOINTMENT REQUIRED FOR FUTURE REFILLS PLEASE  CALL  [phone] Patient not taking: Reported on 09/21/2023 03/23/23   Hugh Madura, MD  nitroGLYCERIN  (NITROSTAT ) 0.4 MG SL  tablet Place 1 tablet (0.4 mg total) under the tongue every 5 (five) minutes as needed for chest pain. 02/26/22 09/30/23  Hugh Madura, MD  potassium chloride (MICRO-K) 10 MEQ CR capsule Take 10 mEq by mouth every morning. 09/08/23   [provider]  rosuvastatin  (CRESTOR ) 20 MG tablet Take 1 tablet by mouth once daily 11/29/20   Almira Jaeger, MD  RYBELSUS 3 MG TABS Take 1 tablet by mouth every morning. 09/24/22   [provider]  traMADol  (ULTRAM ) 50 MG tablet Take 1-2 tablets (50-100 mg total) by mouth every 6 (six) hours as needed for moderate pain. Patient not taking: Reported on 09/21/2023 09/18/21   Andrez Banker, MD    Current Facility-Administered Medications  Medication Dose Route Frequency Provider Last Rate Last Admin   furosemide (LASIX) injection 40 mg  40 mg Intravenous Once Ali, Amjad, PA-C        Allergies as of 09/30/2023 - Review Complete 09/30/2023  Allergen Reaction Noted   Lisinopril Anaphylaxis 06/15/2018   Codeine      Social History   Socioeconomic History   Marital status: Widowed    Spouse name: Not on file   Number of children: 1   Years of education: Not on file   Highest education level: Not on file  Occupational History   Occupation: Retired     Associate Professor: [employer]  Tobacco Use   Smoking status: Every Day    Current packs/day: 0.25    Average packs/day: 0.3 packs/day for 56.0 years (14.0 ttl pk-yrs)    Types: Cigarettes   Smokeless tobacco: Never  Vaping Use   Vaping status: Never Used  Substance and Sexual Activity   Alcohol use: No    Alcohol/week: 0.0 standard drinks of alcohol   Drug use: No    Types: Crack cocaine    Comment: last crack was about 8 years ago   Sexual activity: Not on file  Other Topics Concern  Not on file  Social History Narrative   3 grand children    9 great grandchildren    Social Drivers of Corporate investment banker Strain: Low Risk  (05/09/2020)   Overall Financial  Resource Strain (CARDIA)    Difficulty of Paying Living Expenses: Not hard at all  Food Insecurity: No Food Insecurity (05/09/2020)   Hunger Vital Sign    Worried About Running Out of Food in the Last Year: Never true    Ran Out of Food in the Last Year: Never true  Transportation Needs: No Transportation Needs (05/09/2020)   PRAPARE - Administrator, Civil Service (Medical): No    Lack of Transportation (Non-Medical): No  Physical Activity: Inactive (05/09/2020)   Exercise Vital Sign    Days of Exercise per Week: 0 days    Minutes of Exercise per Session: 0 min  Stress: No Stress Concern Present (05/09/2020)   [employer] of Occupational Health - Occupational Stress Questionnaire    Feeling of Stress : Not at all  Social Connections: Unknown (07/21/2022)   [facility]   Social Network    Social Network: Not on file  Intimate Partner Violence: Unknown (07/21/2022)   Received from [facility]   HITS    Physically Hurt: Not on file    Insult or Talk Down To: Not on file    Threaten Physical Harm: Not on file    Scream or Curse: Not on file     Code Status   Code Status: Prior  Review of Systems: All systems reviewed and negative except where noted in HPI.  Physical Exam: Vital signs in last 24 hours: Temp:  [98.1 F (36.7 C)-98.2 F (36.8 C)] 98.1 F (36.7 C) (06/12 1600) Pulse Rate:  [61-77] 61 (06/12 1600) Resp:  [18-20] 18 (06/12 1600) BP: (129-142)/(72-81) 142/72 (06/12 1600) SpO2:  [98 %-100 %] 98 % (06/12 1600) Weight:  [135.6 kg] 135.6 kg (06/12 1100)    General:  Pleasant female in NAD.  Psych:  Cooperative. Normal mood and affect Eyes: Pupils equal Ears:  Normal auditory acuity Nose: No deformity, discharge or lesions Neck:  Supple, no masses felt Lungs:  Clear to auscultation.  Heart:  Regular rate, regular rhythm.  Abdomen:  Soft, nondistended, nontender, active bowel sounds, no  obvious masses  Rectal :  Deferred Msk:  Symmetrical without gross deformities.  Neurologic:  Alert, oriented, grossly normal neurologically Extremities : 1+ BLE edema Skin:  Intact without significant lesions.    Intake/Output from previous day: No intake/output data recorded. Intake/Output this shift:  No intake/output data recorded.   Mai Schwalbe, NP-C   09/30/2023, 4:18 PM

## 2023-09-30 NOTE — Plan of Care (Signed)
 FMTS Interim Progress Note  Night time rounds. Patient awake and feeling well. Denies SOB. Denies BM.    BP (!) 143/84 (BP Location: Right Arm)   Pulse 68   Temp 98.8 F (37.1 C) (Oral)   Resp 18   Ht 5' 6 (1.676 m)   Wt 135.6 kg   LMP  (LMP Unknown)   SpO2 100%   BMI 48.26 kg/m   General: Chronically ill-appearing, no acute distress Cardio: RRR, no murmur on exam Pulm: clear, No increased work of breathing Abdomen: Soft, bowel sounds present, nontender Extremity: No peripheral edema   A/P: Symptomatic Anemia:  Will await post-transfusion CBC s/p 1 unit.  - f/u CBC  - echo pending  - follow strict I&O  Tobacco abuse:  Nicotine patch as needed   Clem Currier, DO 09/30/2023, 9:04 PM PGY-2, [facility] Service pager [phone]

## 2023-09-30 NOTE — ED Notes (Addendum)
 Unable to obtain IV access with US . IV team consult placed.

## 2023-09-30 NOTE — ED Provider Notes (Signed)
 [place] EMERGENCY DEPARTMENT AT [facility] Provider Note   CSN: [id number] Arrival date & time: 09/30/23  1052     Patient presents with: Foot Swelling and Shortness of Breath   [name] Cooper is a 73 y.o. female.   73 year old female presents today for concern of peripheral edema, exertional dyspnea for 3 weeks as well as fatigue.  Compliant with Lasix .  She takes 40 mg daily.  Has a cardiology visit coming up with next month for new patient appointment.  States prior to 3 weeks ago she did not have any exertional dyspnea.  The history is provided by the patient. No language interpreter was used.       Prior to Admission medications   Medication Sig Start Date End Date Taking? Authorizing Provider  acetaminophen  (TYLENOL ) 650 MG CR tablet Take 1,300 mg by mouth every 8 (eight) hours as needed for pain.    [provider]  albuterol  (VENTOLIN  HFA) 108 (90 Base) MCG/ACT inhaler Inhale 2 puffs into the lungs every 6 (six) hours as needed for wheezing or shortness of breath. 07/08/20   Raymona Caldwell, MD  Ascorbic Acid  (VITAMIN C) 500 MG CAPS Take 500 mg by mouth daily.    [provider]  aspirin  EC 81 MG tablet Take 1 tablet (81 mg total) by mouth daily. 01/11/19   Hugh Madura, MD  cholecalciferol (VITAMIN D3) 25 MCG (1000 UNIT) tablet Take 1,000 Units by mouth daily.    [provider]  ezetimibe  (ZETIA ) 10 MG tablet Take 1 tablet (10 mg total) by mouth daily. Patient not taking: Reported on 09/21/2023 03/03/21   Hugh Madura, MD  ferrous sulfate 325 (65 FE) MG EC tablet Take 325 mg by mouth daily at 12 noon.    [provider]  furosemide  (LASIX ) 20 MG tablet Take 20 mg by mouth daily as needed for fluid. 08/29/21   [provider]  hydrALAZINE  (APRESOLINE ) 50 MG tablet Take 50 mg by mouth 2 (two) times daily. 08/25/23   [provider]  HYDROcodone -acetaminophen  (NORCO/VICODIN) 5-325 MG tablet Take 1 tablet by mouth  2 (two) times daily as needed. Patient not taking: Reported on 09/21/2023 05/22/22   [provider]  isosorbide  mononitrate (IMDUR ) 30 MG 24 hr tablet TAKE 1 TABLET BY MOUTH ONCE DAILY . APPOINTMENT REQUIRED FOR FUTURE REFILLS PLEASE  CALL  [phone] Patient not taking: Reported on 09/21/2023 03/23/23   Hugh Madura, MD  nitroGLYCERIN  (NITROSTAT ) 0.4 MG SL tablet Place 1 tablet (0.4 mg total) under the tongue every 5 (five) minutes as needed for chest pain. 02/26/22 02/26/23  Hugh Madura, MD  potassium chloride (MICRO-K) 10 MEQ CR capsule Take 10 mEq by mouth every morning. 09/08/23   [provider]  rosuvastatin  (CRESTOR ) 20 MG tablet Take 1 tablet by mouth once daily 11/29/20   Almira Jaeger, MD  RYBELSUS 3 MG TABS Take 1 tablet by mouth every morning. 09/24/22   [provider]  traMADol  (ULTRAM ) 50 MG tablet Take 1-2 tablets (50-100 mg total) by mouth every 6 (six) hours as needed for moderate pain. Patient not taking: Reported on 09/21/2023 09/18/21   Andrez Banker, MD    Allergies: Lisinopril and Codeine    Review of Systems  Respiratory:  Positive for shortness of breath.   Cardiovascular:  Positive for leg swelling. Negative for chest pain.  Neurological:  Negative for light-headedness.    Updated Vital Signs BP 129/81 (BP Location: Left Arm)  Pulse 77   Temp 98.2 F (36.8 C) (Oral)   Resp 20   Ht 5' 6 (1.676 m)   Wt 135.6 kg   LMP  (LMP Unknown)   SpO2 100%   BMI 48.26 kg/m   Physical Exam Vitals and nursing note reviewed.  Constitutional:      General: She is not in acute distress.    Appearance: Normal appearance. She is not ill-appearing.  HENT:     Head: Normocephalic and atraumatic.     Nose: Nose normal.   Eyes:     Conjunctiva/sclera: Conjunctivae normal.    Cardiovascular:     Rate and Rhythm: Normal rate and regular rhythm.  Pulmonary:     Effort: Pulmonary effort is normal. No respiratory distress.    Musculoskeletal:        General: No deformity.   Skin:    Findings: No rash.   Neurological:     Mental Status: She is alert.     (all labs ordered are listed, but only abnormal results are displayed) Labs Reviewed  BASIC METABOLIC PANEL WITH GFR - Abnormal; Notable for the following components:      Result Value   Creatinine, Ser 1.31 (*)    GFR, Estimated 43 (*)    All other components within normal limits  CBC - Abnormal; Notable for the following components:   Hemoglobin 7.5 (*)    HCT 27.4 (*)    MCV 63.3 (*)    MCH 17.3 (*)    MCHC 27.4 (*)    RDW 20.3 (*)    nRBC 0.4 (*)    All other components within normal limits  POC OCCULT BLOOD, ED - Abnormal; Notable for the following components:   Fecal Occult Bld POSITIVE (*)    All other components within normal limits  BRAIN NATRIURETIC PEPTIDE  MAGNESIUM  TROPONIN I (HIGH SENSITIVITY)  TROPONIN I (HIGH SENSITIVITY)    EKG: None  Radiology: DG Chest 2 View Result Date: 09/30/2023 CLINICAL DATA:  SOB EXAM: CHEST - 2 VIEW COMPARISON:  09/27/2023 FINDINGS: Lungs are clear.  Borderline hyperinflation. Heart size and mediastinal contours are within normal limits. No effusion. Surgical clips right axilla. IMPRESSION: No acute cardiopulmonary disease. Electronically Signed   By: Nicoletta Barrier M.D.   On: 09/30/2023 11:31     Procedures   Medications Ordered in the ED  furosemide  (LASIX ) injection 40 mg (has no administration in time range)    Clinical Course as of 09/30/23 1545  Thu Sep 30, 2023  1145 ED EKG [AA]  1458 Hemoglobin(!): 7.5 [AA]  1536 Hemoglobin(!): 7.5 [AA]    Clinical Course User Index [AA] Lucina Sabal, PA-C                                 Medical Decision Making Amount and/or Complexity of Data Reviewed Labs: ordered. Decision-making details documented in ED Course. Radiology: ordered. ECG/medicine tests:  Decision-making details documented in ED Course.  Risk Prescription drug  management. Decision regarding hospitalization.   Medical Decision Making / ED Course   This patient presents to the ED for concern of peripheral edema, shortness of breath, this involves an extensive number of treatment options, and is a complaint that carries with it a high risk of complications and morbidity.  The differential diagnosis includes CHF exacerbation, anemia, COPD exacerbation  MDM: 73 year old female presents today for peripheral edema, exertional dyspnea.  Compliant with her  home medicines. I ambulated patient about 100 feet in the department and she did not desat and did not have increased work of breathing. Hemoglobin low at 7.5.  This is a 3 g drop from her baseline. Hemoccult positive. Maureen Sour NP notified with [facility].  They will consult. Discussed with family practice service.  They will admit.  Lasix  IVP 40 mg ordered.  Lab Tests: -I ordered, reviewed, and interpreted labs.   The pertinent results include:   Labs Reviewed  BASIC METABOLIC PANEL WITH GFR - Abnormal; Notable for the following components:      Result Value   Creatinine, Ser 1.31 (*)    GFR, Estimated 43 (*)    All other components within normal limits  CBC - Abnormal; Notable for the following components:   Hemoglobin 7.5 (*)    HCT 27.4 (*)    MCV 63.3 (*)    MCH 17.3 (*)    MCHC 27.4 (*)    RDW 20.3 (*)    nRBC 0.4 (*)    All other components within normal limits  POC OCCULT BLOOD, ED - Abnormal; Notable for the following components:   Fecal Occult Bld POSITIVE (*)    All other components within normal limits  BRAIN NATRIURETIC PEPTIDE  MAGNESIUM  TROPONIN I (HIGH SENSITIVITY)  TROPONIN I (HIGH SENSITIVITY)      EKG  EKG Interpretation Date/Time:    Ventricular Rate:    PR Interval:    QRS Duration:    QT Interval:    QTC Calculation:   R Axis:      Text Interpretation:           Imaging Studies ordered: I ordered imaging studies including chest x-ray.  No volume  overload I independently visualized and interpreted imaging. I agree with the radiologist interpretation   Medicines ordered and prescription drug management: Meds ordered this encounter  Medications   furosemide  (LASIX ) injection 40 mg    -I have reviewed the patients home medicines and have made adjustments as needed    Reevaluation: After the interventions noted above, I reevaluated the patient and found that they have :stayed the same  Co morbidities that complicate the patient evaluation  Past Medical History:  Diagnosis Date   Anemia    Arthritis    Cancer (HCC)    breast and ovarian   Chicken pox    Chronic lower back pain    COPD (chronic obstructive pulmonary disease) (HCC)    Diabetes (HCC)    GERD (gastroesophageal reflux disease)    Hepatitis 1970's   History of recurrent UTIs    Hyperlipemia    Hypertension    Slow heart rate dx'd 09/24/2014   Urine incontinence       Dispostion: Discussed with family practice.  They will evaluate patient for admission.  Final diagnoses:  Anemia, unspecified type    ED Discharge Orders     None          Lucina Sabal, PA-C 09/30/23 1548    Albertus Hughs, DO 10/01/23 [phone]

## 2023-09-30 NOTE — ED Notes (Signed)
 Pt CT

## 2023-10-01 ENCOUNTER — Inpatient Hospital Stay (HOSPITAL_BASED_OUTPATIENT_CLINIC_OR_DEPARTMENT_OTHER)

## 2023-10-01 DIAGNOSIS — R0609 Other forms of dyspnea: Secondary | ICD-10-CM

## 2023-10-01 DIAGNOSIS — D509 Iron deficiency anemia, unspecified: Secondary | ICD-10-CM

## 2023-10-01 DIAGNOSIS — D649 Anemia, unspecified: Secondary | ICD-10-CM | POA: Diagnosis not present

## 2023-10-01 DIAGNOSIS — K922 Gastrointestinal hemorrhage, unspecified: Secondary | ICD-10-CM | POA: Diagnosis present

## 2023-10-01 LAB — CBC
HCT: 29.6 % — ABNORMAL LOW (ref 36.0–46.0)
Hemoglobin: 8.4 g/dL — ABNORMAL LOW (ref 12.0–15.0)
MCH: 18.1 pg — ABNORMAL LOW (ref 26.0–34.0)
MCHC: 28.4 g/dL — ABNORMAL LOW (ref 30.0–36.0)
MCV: 63.9 fL — ABNORMAL LOW (ref 80.0–100.0)
Platelets: 227 10*3/uL (ref 150–400)
RBC: 4.63 MIL/uL (ref 3.87–5.11)
RDW: 23.4 % — ABNORMAL HIGH (ref 11.5–15.5)
WBC: 9.3 10*3/uL (ref 4.0–10.5)
nRBC: 0.5 % — ABNORMAL HIGH (ref 0.0–0.2)

## 2023-10-01 LAB — BASIC METABOLIC PANEL WITH GFR
Anion gap: 12 (ref 5–15)
BUN: 13 mg/dL (ref 8–23)
CO2: 24 mmol/L (ref 22–32)
Calcium: 8.8 mg/dL — ABNORMAL LOW (ref 8.9–10.3)
Chloride: 104 mmol/L (ref 98–111)
Creatinine, Ser: 1.39 mg/dL — ABNORMAL HIGH (ref 0.44–1.00)
GFR, Estimated: 40 mL/min — ABNORMAL LOW (ref >60)
Glucose, Bld: 142 mg/dL — ABNORMAL HIGH (ref 70–99)
Potassium: 3.3 mmol/L — ABNORMAL LOW (ref 3.5–5.1)
Sodium: 140 mmol/L (ref 135–145)

## 2023-10-01 LAB — ECHOCARDIOGRAM COMPLETE
AR max vel: 3.2 cm2
AV Area VTI: 3.17 cm2
AV Area mean vel: 3.15 cm2
AV Mean grad: 6 mmHg
AV Peak grad: 11.4 mmHg
Ao pk vel: 1.69 m/s
Area-P 1/2: 2.91 cm2
Height: 66 in
S' Lateral: 2.6 cm
Weight: 4784 [oz_av]

## 2023-10-01 LAB — HEMOGLOBIN A1C
Hgb A1c MFr Bld: 6.3 % — ABNORMAL HIGH (ref 4.8–5.6)
Mean Plasma Glucose: 134.11 mg/dL

## 2023-10-01 MED ORDER — SODIUM CHLORIDE 0.9 % IV SOLN
400.0000 mg | Freq: Once | INTRAVENOUS | Status: AC
  Administered 2023-10-01: 400 mg via INTRAVENOUS
  Filled 2023-10-01: qty 20

## 2023-10-01 MED ORDER — POTASSIUM CHLORIDE CRYS ER 20 MEQ PO TBCR
40.0000 meq | EXTENDED_RELEASE_TABLET | Freq: Once | ORAL | Status: AC
  Filled 2023-10-01: qty 2

## 2023-10-01 NOTE — Evaluation (Signed)
 Physical Therapy Evaluation Patient Details Name: [name] Cooper MRN: [id number] DOB: 07-Jun-1950 Today's Date: 10/01/2023  History of Present Illness  Pt is a 73 y.o. female admitted 6/12 with symptomatic anemia likely 2/2 GI bleed. PMH:  CAD, IDA, COPD, HTN, HLD, small bowel AVMs, reflux esophagitis, adenomatous colon polyps  Clinical Impression  Pt admitted with above diagnosis. PTA pt lived at home with her brother, independent mobility/ADLs and driving. Pt currently with functional limitations due to the deficits listed below (see PT Problem List). On eval, pt demo mod I bed mobility and transfers. Supervision amb 100' without AD. SpO2 90% on RA. HR in 80s. No SOB/DOE noted. Pt will benefit from acute skilled PT to increase their independence and safety with mobility to allow discharge. PT to follow acutely. No follow up services or DME indicated.          If plan is discharge home, recommend the following:     Can travel by private vehicle        Equipment Recommendations None recommended by PT  Recommendations for Other Services       Functional Status Assessment Patient has had a recent decline in their functional status and demonstrates the ability to make significant improvements in function in a reasonable and predictable amount of time.     Precautions / Restrictions Precautions Precautions: Other (comment) Precaution/Restrictions Comments: watch sats      Mobility  Bed Mobility Overal bed mobility: Modified Independent                  Transfers Overall transfer level: Modified independent Equipment used: None                    Ambulation/Gait Ambulation/Gait assistance: Supervision Gait Distance (Feet): 100 Feet Assistive device: None Gait Pattern/deviations: Step-through pattern, Decreased stride length Gait velocity: decreased Gait velocity interpretation: 1.31 - 2.62 ft/sec, indicative of limited community ambulator   General Gait  Details: mildly unsteady but no overt LOB, no AD needed, SpO2 90% on RA, HR in 80s  Stairs            Wheelchair Mobility     Tilt Bed    Modified Rankin (Stroke Patients Only)       Balance Overall balance assessment: Mild deficits observed, not formally tested                                           Pertinent Vitals/Pain Pain Assessment Pain Assessment: No/denies pain    Home Living Family/patient expects to be discharged to:: Private residence Living Arrangements: Other relatives (brother) Available Help at Discharge: Family;Available PRN/intermittently Type of Home: House Home Access: Stairs to enter   [employer] of Steps: 1 (threshold)   Home Layout: One level Home Equipment: Shower seat      Prior Function Prior Level of Function : Independent/Modified Independent;Driving             Mobility Comments: able to walk in grocery store using shopping cart for support       Extremity/Trunk Assessment   Upper Extremity Assessment Upper Extremity Assessment: Defer to OT evaluation    Lower Extremity Assessment Lower Extremity Assessment: Overall WFL for tasks assessed    Cervical / Trunk Assessment Cervical / Trunk Assessment: Other exceptions Cervical / Trunk Exceptions: body habitus  Communication   Communication Communication: No apparent difficulties  Cognition Arousal: Alert Behavior During Therapy: WFL for tasks assessed/performed   PT - Cognitive impairments: No apparent impairments                         Following commands: Intact       Cueing Cueing Techniques: Verbal cues     General Comments General comments (skin integrity, edema, etc.): VSS on RA, no SOB/DOE noted    Exercises     Assessment/Plan    PT Assessment Patient needs continued PT services  PT Problem List Decreased mobility;Decreased activity tolerance;Cardiopulmonary status limiting activity       PT  Treatment Interventions Therapeutic activities;Gait training;Therapeutic exercise;Patient/family education;Stair training;Balance training;Functional mobility training    PT Goals (Current goals can be found in the Care Plan section)  Acute Rehab PT Goals Patient Stated Goal: home PT Goal Formulation: With patient Time For Goal Achievement: 10/15/23 Potential to Achieve Goals: Good    Frequency Min 2X/week     Co-evaluation               AM-PAC PT 6 Clicks Mobility  Outcome Measure Help needed turning from your back to your side while in a flat bed without using bedrails?: None Help needed moving from lying on your back to sitting on the side of a flat bed without using bedrails?: None Help needed moving to and from a bed to a chair (including a wheelchair)?: None Help needed standing up from a chair using your arms (e.g., wheelchair or bedside chair)?: None Help needed to walk in hospital room?: A Little Help needed climbing 3-5 steps with a railing? : A Little 6 Click Score: 22    End of Session   Activity Tolerance: Patient tolerated treatment well Patient left: in chair;with call bell/phone within reach Nurse Communication: Mobility status PT Visit Diagnosis: Difficulty in walking, not elsewhere classified (R26.2)    Time: 4098-1191 PT Time Calculation (min) (ACUTE ONLY): 12 min   Charges:   PT Evaluation $PT Eval Low Complexity: 1 Low   PT General Charges $$ ACUTE PT VISIT: 1 Visit         Dorothye Gathers., PT  Office # [phone]   Guadelupe Leech 10/01/2023, 8:13 AM

## 2023-10-01 NOTE — H&P (View-Only) (Signed)
 Daily Progress Note  DOA: 09/30/2023 Hospital Day: 2   Cc:  Anemia, FOBT+   Brief History; 73 y.o. year old female with a medical history including but not limited to COPD, iron  deficiency anemia, small bowel AVMs , reflux esophagitis, adenomatous colon polyps,  HTN, obesity, DM, CAD s/p stent placement in 2020 .  Admitted with acute on chronic anemia /peripheral edema.  See 09/30/23 GI consult   Attending physician's note   I personally saw the patient and performed a substantive portion of this encounter (>50% time spent), including a complete performance of at least one of the key components (MDM, Hx and/or Exam), in conjunction with the APP.  I agree with the APP's note, impression, and recommendations with additional input as follows.     Shortness of breath and bilateral pedal edema improving with diuresis Hemoglobin responded appropriately to 1 unit PRBC transfusion Severe iron  deficiency anemia  Will plan to proceed with push enteroscopy tomorrow for diagnosis and also therapeutic intervention of possible bleeding small bowel AVMs N.p.o. after midnight  The risks and benefits as well as alternatives of endoscopic procedure(s) have been discussed and reviewed. All questions answered. The patient agrees to proceed.   The patient was provided an opportunity to ask questions and all were answered. The patient agreed with the plan and demonstrated an understanding of the instructions.   Lorena Rolling , MD [phone]     ASSESSMENT    73 year old female with acute on chronic iron  deficiency anemia Dark stools in setting of Iron  FOBT+ History of small bowel AVMs  Followed outpatient by Hematology.  Extensive workup for etiology unrevealing. Last iron  infusion ~ 6 months ago. Presenting hgb 7.5, (down from 10.8 in January).  May have occult bleeding from small bowel AVMs or erosive disease. >>TODAY Hemoglobin improved to 8.4 after unit of blood.   Pedal edema and  dyspnea Dyspnea resolved.  Edema improving with diuresis.  Undergoing workup for possible  new onset CHF.  LVEF 65 to 70% on today's echo  COPD  Diabetes On semaglutide   Principal Problem:   Symptomatic anemia Active Problems:   Chronic health problem   Amber-colored urine   GI bleed   PLAN   --Plan for small bowel enteroscopy tomorrow.  The risks and benefits of EGD with possible biopsies were discussed with the patient who agrees to proceed.  --Continue to trend H/H, transfuse as needed -- Recommend dose of IV iron  -- Continue twice daily IV pantoprazole   Subjective   No complaints.  Eating lunch.  No shortness of breath today   Objective    Recent Labs    09/30/23 1242 10/01/23 1051  WBC 7.8 9.3  HGB 7.5* 8.4*  HCT 27.4* 29.6*  MCV 63.3* 63.9*  PLT 232 227   Recent Labs    09/30/23 1727  FERRITIN 3*  TIBC 396  IRONPCTSAT 4*   Recent Labs    09/30/23 1242 10/01/23 1051  NA 143 140  K 3.8 3.3*  CL 105 104  CO2 29 24  GLUCOSE 99 142*  BUN 13 13  CREATININE 1.31* 1.39*  CALCIUM  9.0 8.8*   No results for input(s): PROT, ALBUMIN, AST, ALT, ALKPHOS, BILITOT, BILIDIR, IBILI in the last 72 hours.   Echo 6/13 IMPRESSIONS Left ventricular ejection fraction, by estimation, is 65 to 70%. The left ventricle has normal function. The left ventricle has no regional wall motion abnormalities. There is mild left ventricular hypertrophy. Left ventricular diastolic parameters  were normal. 1. Right ventricular systolic function is normal. The right ventricular size is normal. Tricuspid regurgitation signal is inadequate for assessing PA pressure. 2. The mitral valve is normal in structure. Trivial mitral valve regurgitation. No evidence of mitral stenosis. 3. The aortic valve is tricuspid. Aortic valve regurgitation is not visualized. Aortic valve sclerosis is present, with no evidence of aortic valve stenosis.   Imaging:  ECHOCARDIOGRAM COMPLETE     ECHOCARDIOGRAM REPORT       Patient Name:   [name] Cooper Date of Exam: 10/01/2023 Medical Rec #:  [id number]       Height:       66.0 in Accession #:    [id number]      Weight:       299.0 lb Date of Birth:  07-Mar-1951       BSA:          2.373 m Patient Age:    72 years        BP:           124/57 mmHg Patient Gender: F               HR:           75 bpm. Exam Location:  Inpatient  Procedure: 2D Echo, Color Doppler and Cardiac Doppler (Both Spectral and Color            Flow Doppler were utilized during procedure).  Indications:    Dyspnea   History:        Patient has prior history of Echocardiogram examinations and                 Patient has no prior history of Echocardiogram examinations.   Sonographer:    Jeralene Mom Referring Phys: [id number] Blossom Burkes MCINTYRE  IMPRESSIONS   1. Left ventricular ejection fraction, by estimation, is 65 to 70%. The left ventricle has normal function. The left ventricle has no regional wall motion abnormalities. There is mild left ventricular hypertrophy. Left ventricular diastolic parameters  were normal.  2. Right ventricular systolic function is normal. The right ventricular size is normal. Tricuspid regurgitation signal is inadequate for assessing PA pressure.  3. The mitral valve is normal in structure. Trivial mitral valve regurgitation. No evidence of mitral stenosis.  4. The aortic valve is tricuspid. Aortic valve regurgitation is not visualized. Aortic valve sclerosis is present, with no evidence of aortic valve stenosis.  FINDINGS  Left Ventricle: Left ventricular ejection fraction, by estimation, is 65 to 70%. The left ventricle has normal function. The left ventricle has no regional wall motion abnormalities. The left ventricular internal cavity size was normal in size. There is  mild left ventricular hypertrophy. Left ventricular diastolic parameters were normal.  Right Ventricle: The right ventricular size is normal. No  increase in right ventricular wall thickness. Right ventricular systolic function is normal. Tricuspid regurgitation signal is inadequate for assessing PA pressure.  Left Atrium: Left atrial size was normal in size.  Right Atrium: Right atrial size was normal in size.  Pericardium: There is no evidence of pericardial effusion.  Mitral Valve: The mitral valve is normal in structure. Trivial mitral valve regurgitation. No evidence of mitral valve stenosis.  Tricuspid Valve: The tricuspid valve is normal in structure. Tricuspid valve regurgitation is trivial.  Aortic Valve: The aortic valve is tricuspid. Aortic valve regurgitation is not visualized. Aortic valve sclerosis is present, with no evidence of aortic valve stenosis. Aortic valve mean gradient measures 6.0 mmHg.  Aortic valve peak gradient measures  11.4 mmHg. Aortic valve area, by VTI measures 3.17 cm.  Pulmonic Valve: The pulmonic valve was grossly normal. Pulmonic valve regurgitation is trivial.  Aorta: The aortic root and ascending aorta are structurally normal, with no evidence of dilitation.  IAS/Shunts: The atrial septum is grossly normal.    LEFT VENTRICLE PLAX 2D LVIDd:         4.10 cm   Diastology LVIDs:         2.60 cm   LV e' medial:    8.62 cm/s LV PW:         1.10 cm   LV E/e' medial:  7.8 LV IVS:        1.00 cm   LV e' lateral:   11.15 cm/s LVOT diam:     2.00 cm   LV E/e' lateral: 6.0 LV SV:         105 LV SV Index:   44 LVOT Area:     3.14 cm    RIGHT VENTRICLE RV Basal diam:  4.10 cm RV Mid diam:    3.60 cm RV S prime:     20.70 cm/s TAPSE (M-mode): 2.1 cm  LEFT ATRIUM             Index        RIGHT ATRIUM           Index LA diam:        4.00 cm 1.69 cm/m   RA Area:     16.90 cm LA Vol (A2C):   68.3 ml 28.79 ml/m  RA Volume:   43.20 ml  18.21 ml/m LA Vol (A4C):   33.0 ml 13.91 ml/m LA Biplane Vol: 49.4 ml 20.82 ml/m  AORTIC VALVE AV Area (Vmax):    3.20 cm AV Area (Vmean):   3.15 cm AV  Area (VTI):     3.17 cm AV Vmax:           169.00 cm/s AV Vmean:          110.667 cm/s AV VTI:            0.331 m AV Peak Grad:      11.4 mmHg AV Mean Grad:      6.0 mmHg LVOT Vmax:         172.00 cm/s LVOT Vmean:        111.000 cm/s LVOT VTI:          0.334 m LVOT/AV VTI ratio: 1.01   AORTA Ao Root diam: 3.00 cm Ao Asc diam:  2.90 cm  MITRAL VALVE MV Area (PHT): 2.91 cm     SHUNTS MV Decel Time: 261 msec     Systemic VTI:  0.33 m MV E velocity: 67.10 cm/s   Systemic Diam: 2.00 cm MV A velocity: 118.00 cm/s MV E/A ratio:  0.57  Carson Clara MD Electronically signed by Carson Clara MD Signature Date/Time: 10/01/2023/9:47:22 AM      Final       Scheduled inpatient medications:   ezetimibe   10 mg Oral Daily   fluticasone  furoate-vilanterol  1 puff Inhalation Daily   furosemide   40 mg Oral Daily   gabapentin   300 mg Oral QHS   hydrALAZINE   50 mg Oral BID   isosorbide  mononitrate  30 mg Oral Daily   pantoprazole  (PROTONIX ) IV  40 mg Intravenous BID   potassium chloride  40 mEq Oral Once   rosuvastatin   20 mg Oral Daily  Continuous inpatient infusions:  PRN inpatient medications: acetaminophen  **OR** acetaminophen , albuterol , nicotine   Vital signs in last 24 hours: Temp:  [97.9 F (36.6 C)-99.5 F (37.5 C)] 99.1 F (37.3 C) (06/13 0804) Pulse Rate:  [61-88] 79 (06/13 0804) Resp:  [18] 18 (06/13 0804) BP: (123-148)/(51-86) 124/69 (06/13 0804) SpO2:  [91 %-100 %] 92 % (06/13 0804)    Intake/Output Summary (Last 24 hours) at 10/01/2023 1239 Last data filed at 10/01/2023 0300 Gross per 24 hour  Intake 477.5 ml  Output 1250 ml  Net -772.5 ml    Intake/Output from previous day: 06/12 0701 - 06/13 0700 In: 477.5 [Blood:477.5] Out: 1250 [Urine:1250] Intake/Output this shift: No intake/output data recorded.   Physical Exam:  General: Alert female female in NAD.  Eating lunch Heart:  Regular rate and rhythm.  Pulmonary: Normal respiratory  effort, occasional fine bibasilar crackles Abdomen: Soft, nondistended, nontender. Normal bowel sounds. Extremities: 2+ BLE edema  Neurologic: Alert and oriented Psych: Pleasant. Cooperative     LOS: 1 day   [name] Cooper ,NP 10/01/2023, 12:39 PM

## 2023-10-01 NOTE — Evaluation (Signed)
 Occupational Therapy Evaluation/Discharge Patient Details Name: [name] Cooper MRN: [id number] DOB: Nov 27, 1950 Today's Date: 10/01/2023   History of Present Illness   Pt is a 73 y.o. female admitted 6/12 with symptomatic anemia likely 2/2 GI bleed. PMH:  CAD, IDA, COPD, HTN, HLD, small bowel AVMs, reflux esophagitis, adenomatous colon polyps     Clinical Impressions PTA, pt lives with her brother, typically Independent with ADLs, mobility and driving. Pt presents now at baseline for ADLs- able to manage tasks during session without physical assist or safety concerns. Pt denies SOB with ADLs- only if longer a longer distance out in community. No further skilled OT services needed at this time. Recommend continued OOB activity with mobility specialists while admitted.      If plan is discharge home, recommend the following:   Assistance with cooking/housework     Functional Status Assessment   Patient has not had a recent decline in their functional status     Equipment Recommendations   None recommended by OT     Recommendations for Other Services         Precautions/Restrictions   Precautions Precautions: None Restrictions Weight Bearing Restrictions Per Provider Order: No     Mobility Bed Mobility               General bed mobility comments: EOB on entry    Transfers Overall transfer level: Independent Equipment used: None                      Balance Overall balance assessment: No apparent balance deficits (not formally assessed)                                         ADL either performed or assessed with clinical judgement   ADL Overall ADL's : Modified independent                                       General ADL Comments: able to manage full bathing at sink, reaching to lower LE, managing clothing w/o safety concerns. pt denied SOB, able to mobilize in room without AD and has been going to  bathroom without assist     Vision Ability to See in Adequate Light: 0 Adequate Patient Visual Report: No change from baseline Vision Assessment?: No apparent visual deficits     Perception         Praxis         Pertinent Vitals/Pain Pain Assessment Pain Assessment: No/denies pain     Extremity/Trunk Assessment Upper Extremity Assessment Upper Extremity Assessment: Overall WFL for tasks assessed;Right hand dominant   Lower Extremity Assessment Lower Extremity Assessment: Defer to PT evaluation   Cervical / Trunk Assessment Cervical / Trunk Assessment: Other exceptions Cervical / Trunk Exceptions: body habitus   Communication Communication Communication: No apparent difficulties   Cognition Arousal: Alert Behavior During Therapy: WFL for tasks assessed/performed Cognition: No apparent impairments                               Following commands: Intact       Cueing  General Comments   Cueing Techniques: Verbal cues  VSS on RA, no SOB/DOE noted   Exercises     Shoulder Instructions  Home Living Family/patient expects to be discharged to:: Private residence Living Arrangements: Other (Comment) (brother) Available Help at Discharge: Family;Available PRN/intermittently Type of Home: House Home Access: Stairs to enter [employer] of Steps: 1 (threshold)   Home Layout: One level     Bathroom Shower/Tub: Chief Strategy Officer: Standard     Home Equipment: Shower seat          Prior Functioning/Environment Prior Level of Function : Independent/Modified Independent;Driving             Mobility Comments: able to walk in grocery store using shopping cart for support ADLs Comments: Indep with ADLs, stands for showers but has a chair if needed. Brother manages cooking and cleaning at home    OT Problem List:     OT Treatment/Interventions:        OT Goals(Current goals can be found in the care plan  section)   Acute Rehab OT Goals Patient Stated Goal: home soon OT Goal Formulation: All assessment and education complete, DC therapy   OT Frequency:       Co-evaluation              AM-PAC OT 6 Clicks Daily Activity     Outcome Measure Help from another person eating meals?: None Help from another person taking care of personal grooming?: None Help from another person toileting, which includes using toliet, bedpan, or urinal?: None Help from another person bathing (including washing, rinsing, drying)?: None Help from another person to put on and taking off regular upper body clothing?: None Help from another person to put on and taking off regular lower body clothing?: None 6 Click Score: 24   End of Session    Activity Tolerance: Patient tolerated treatment well Patient left: in bed;with call bell/phone within reach  OT Visit Diagnosis: Other (comment) (reduced cardiopulmonary endurance)                Time: 0945-1000 OT Time Calculation (min): 15 min Charges:  OT General Charges $OT Visit: 1 Visit OT Evaluation $OT Eval Low Complexity: 1 Low  Lawrence Pretty, OTR/L Acute Rehab Services Office: [phone]   Shireen Dory 10/01/2023, 10:51 AM

## 2023-10-01 NOTE — Care Management CC44 (Addendum)
 Condition Code 44 Documentation Completed  Patient Details  Name: Shamari Trostel MRN: [id number] Date of Birth: [birth year]   Condition Code 44 given:  Yes Patient signature on Condition Code 44 notice:  Yes Documentation of 2 MD's agreement:  Yes Code 44 added to claim:  Yes   Verbally explained code 44 IP to OBV to patient and family Ronni Colace, RN 10/01/2023, 1:18 PM

## 2023-10-01 NOTE — Progress Notes (Addendum)
 Daily Progress Note  DOA: 09/30/2023 Hospital Day: 2   Cc:  Anemia, FOBT+   Brief History; 73 y.o. year old female with a medical history including but not limited to COPD, iron  deficiency anemia, small bowel AVMs , reflux esophagitis, adenomatous colon polyps,  HTN, obesity, DM, CAD s/p stent placement in 2020 .  Admitted with acute on chronic anemia /peripheral edema.  See 09/30/23 GI consult   Attending physician's note   I personally saw the patient and performed a substantive portion of this encounter (>50% time spent), including a complete performance of at least one of the key components (MDM, Hx and/or Exam), in conjunction with the APP.  I agree with the APP's note, impression, and recommendations with additional input as follows.     Shortness of breath and bilateral pedal edema improving with diuresis Hemoglobin responded appropriately to 1 unit PRBC transfusion Severe iron  deficiency anemia  Will plan to proceed with push enteroscopy tomorrow for diagnosis and also therapeutic intervention of possible bleeding small bowel AVMs N.p.o. after midnight  The risks and benefits as well as alternatives of endoscopic procedure(s) have been discussed and reviewed. All questions answered. The patient agrees to proceed.   The patient was provided an opportunity to ask questions and all were answered. The patient agreed with the plan and demonstrated an understanding of the instructions.   Lorena Rolling , MD [phone]     ASSESSMENT    73 year old female with acute on chronic iron  deficiency anemia Dark stools in setting of Iron  FOBT+ History of small bowel AVMs  Followed outpatient by Hematology.  Extensive workup for etiology unrevealing. Last iron  infusion ~ 6 months ago. Presenting hgb 7.5, (down from 10.8 in January).  May have occult bleeding from small bowel AVMs or erosive disease. >>TODAY Hemoglobin improved to 8.4 after unit of blood.   Pedal edema and  dyspnea Dyspnea resolved.  Edema improving with diuresis.  Undergoing workup for possible  new onset CHF.  LVEF 65 to 70% on today's echo  COPD  Diabetes On semaglutide   Principal Problem:   Symptomatic anemia Active Problems:   Chronic health problem   Amber-colored urine   GI bleed   PLAN   --Plan for small bowel enteroscopy tomorrow.  The risks and benefits of EGD with possible biopsies were discussed with the patient who agrees to proceed.  --Continue to trend H/H, transfuse as needed -- Recommend dose of IV iron  -- Continue twice daily IV pantoprazole   Subjective   No complaints.  Eating lunch.  No shortness of breath today   Objective    Recent Labs    09/30/23 1242 10/01/23 1051  WBC 7.8 9.3  HGB 7.5* 8.4*  HCT 27.4* 29.6*  MCV 63.3* 63.9*  PLT 232 227   Recent Labs    09/30/23 1727  FERRITIN 3*  TIBC 396  IRONPCTSAT 4*   Recent Labs    09/30/23 1242 10/01/23 1051  NA 143 140  K 3.8 3.3*  CL 105 104  CO2 29 24  GLUCOSE 99 142*  BUN 13 13  CREATININE 1.31* 1.39*  CALCIUM  9.0 8.8*   No results for input(s): PROT, ALBUMIN, AST, ALT, ALKPHOS, BILITOT, BILIDIR, IBILI in the last 72 hours.   Echo 6/13 IMPRESSIONS Left ventricular ejection fraction, by estimation, is 65 to 70%. The left ventricle has normal function. The left ventricle has no regional wall motion abnormalities. There is mild left ventricular hypertrophy. Left ventricular diastolic parameters  were normal. 1. Right ventricular systolic function is normal. The right ventricular size is normal. Tricuspid regurgitation signal is inadequate for assessing PA pressure. 2. The mitral valve is normal in structure. Trivial mitral valve regurgitation. No evidence of mitral stenosis. 3. The aortic valve is tricuspid. Aortic valve regurgitation is not visualized. Aortic valve sclerosis is present, with no evidence of aortic valve stenosis.   Imaging:  ECHOCARDIOGRAM COMPLETE     ECHOCARDIOGRAM REPORT       Patient Name:   SINCLAIR ARRAZOLA Date of Exam: 10/01/2023 Medical Rec #:  [id number]       Height:       66.0 in Accession #:    [id number]      Weight:       299.0 lb Date of Birth:  07-Mar-1951       BSA:          2.373 m Patient Age:    72 years        BP:           124/57 mmHg Patient Gender: F               HR:           75 bpm. Exam Location:  Inpatient  Procedure: 2D Echo, Color Doppler and Cardiac Doppler (Both Spectral and Color            Flow Doppler were utilized during procedure).  Indications:    Dyspnea   History:        Patient has prior history of Echocardiogram examinations and                 Patient has no prior history of Echocardiogram examinations.   Sonographer:    Jeralene Mom Referring Phys: [id number] Burkes MCINTYRE  IMPRESSIONS   1. Left ventricular ejection fraction, by estimation, is 65 to 70%. The left ventricle has normal function. The left ventricle has no regional wall motion abnormalities. There is mild left ventricular hypertrophy. Left ventricular diastolic parameters  were normal.  2. Right ventricular systolic function is normal. The right ventricular size is normal. Tricuspid regurgitation signal is inadequate for assessing PA pressure.  3. The mitral valve is normal in structure. Trivial mitral valve regurgitation. No evidence of mitral stenosis.  4. The aortic valve is tricuspid. Aortic valve regurgitation is not visualized. Aortic valve sclerosis is present, with no evidence of aortic valve stenosis.  FINDINGS  Left Ventricle: Left ventricular ejection fraction, by estimation, is 65 to 70%. The left ventricle has normal function. The left ventricle has no regional wall motion abnormalities. The left ventricular internal cavity size was normal in size. There is  mild left ventricular hypertrophy. Left ventricular diastolic parameters were normal.  Right Ventricle: The right ventricular size is normal. No  increase in right ventricular wall thickness. Right ventricular systolic function is normal. Tricuspid regurgitation signal is inadequate for assessing PA pressure.  Left Atrium: Left atrial size was normal in size.  Right Atrium: Right atrial size was normal in size.  Pericardium: There is no evidence of pericardial effusion.  Mitral Valve: The mitral valve is normal in structure. Trivial mitral valve regurgitation. No evidence of mitral valve stenosis.  Tricuspid Valve: The tricuspid valve is normal in structure. Tricuspid valve regurgitation is trivial.  Aortic Valve: The aortic valve is tricuspid. Aortic valve regurgitation is not visualized. Aortic valve sclerosis is present, with no evidence of aortic valve stenosis. Aortic valve mean gradient measures 6.0 mmHg.  Aortic valve peak gradient measures  11.4 mmHg. Aortic valve area, by VTI measures 3.17 cm.  Pulmonic Valve: The pulmonic valve was grossly normal. Pulmonic valve regurgitation is trivial.  Aorta: The aortic root and ascending aorta are structurally normal, with no evidence of dilitation.  IAS/Shunts: The atrial septum is grossly normal.    LEFT VENTRICLE PLAX 2D LVIDd:         4.10 cm   Diastology LVIDs:         2.60 cm   LV e' medial:    8.62 cm/s LV PW:         1.10 cm   LV E/e' medial:  7.8 LV IVS:        1.00 cm   LV e' lateral:   11.15 cm/s LVOT diam:     2.00 cm   LV E/e' lateral: 6.0 LV SV:         105 LV SV Index:   44 LVOT Area:     3.14 cm    RIGHT VENTRICLE RV Basal diam:  4.10 cm RV Mid diam:    3.60 cm RV S prime:     20.70 cm/s TAPSE (M-mode): 2.1 cm  LEFT ATRIUM             Index        RIGHT ATRIUM           Index LA diam:        4.00 cm 1.69 cm/m   RA Area:     16.90 cm LA Vol (A2C):   68.3 ml 28.79 ml/m  RA Volume:   43.20 ml  18.21 ml/m LA Vol (A4C):   33.0 ml 13.91 ml/m LA Biplane Vol: 49.4 ml 20.82 ml/m  AORTIC VALVE AV Area (Vmax):    3.20 cm AV Area (Vmean):   3.15 cm AV  Area (VTI):     3.17 cm AV Vmax:           169.00 cm/s AV Vmean:          110.667 cm/s AV VTI:            0.331 m AV Peak Grad:      11.4 mmHg AV Mean Grad:      6.0 mmHg LVOT Vmax:         172.00 cm/s LVOT Vmean:        111.000 cm/s LVOT VTI:          0.334 m LVOT/AV VTI ratio: 1.01   AORTA Ao Root diam: 3.00 cm Ao Asc diam:  2.90 cm  MITRAL VALVE MV Area (PHT): 2.91 cm     SHUNTS MV Decel Time: 261 msec     Systemic VTI:  0.33 m MV E velocity: 67.10 cm/s   Systemic Diam: 2.00 cm MV A velocity: 118.00 cm/s MV E/A ratio:  0.57  Carson Clara MD Electronically signed by Carson Clara MD Signature Date/Time: 10/01/2023/9:47:22 AM      Final       Scheduled inpatient medications:   ezetimibe   10 mg Oral Daily   fluticasone  furoate-vilanterol  1 puff Inhalation Daily   furosemide   40 mg Oral Daily   gabapentin   300 mg Oral QHS   hydrALAZINE   50 mg Oral BID   isosorbide  mononitrate  30 mg Oral Daily   pantoprazole  (PROTONIX ) IV  40 mg Intravenous BID   potassium chloride  40 mEq Oral Once   rosuvastatin   20 mg Oral Daily  Continuous inpatient infusions:  PRN inpatient medications: acetaminophen  **OR** acetaminophen , albuterol , nicotine   Vital signs in last 24 hours: Temp:  [97.9 F (36.6 C)-99.5 F (37.5 C)] 99.1 F (37.3 C) (06/13 0804) Pulse Rate:  [61-88] 79 (06/13 0804) Resp:  [18] 18 (06/13 0804) BP: (123-148)/(51-86) 124/69 (06/13 0804) SpO2:  [91 %-100 %] 92 % (06/13 0804)    Intake/Output Summary (Last 24 hours) at 10/01/2023 1239 Last data filed at 10/01/2023 0300 Gross per 24 hour  Intake 477.5 ml  Output 1250 ml  Net -772.5 ml    Intake/Output from previous day: 06/12 0701 - 06/13 0700 In: 477.5 [Blood:477.5] Out: 1250 [Urine:1250] Intake/Output this shift: No intake/output data recorded.   Physical Exam:  General: Alert female female in NAD.  Eating lunch Heart:  Regular rate and rhythm.  Pulmonary: Normal respiratory  effort, occasional fine bibasilar crackles Abdomen: Soft, nondistended, nontender. Normal bowel sounds. Extremities: 2+ BLE edema  Neurologic: Alert and oriented Psych: Pleasant. Cooperative     LOS: 1 day   Mai Schwalbe ,NP 10/01/2023, 12:39 PM

## 2023-10-01 NOTE — Assessment & Plan Note (Addendum)
 Echo normal.  AM Hgb 8.3 stable.  GI consulted at admission, patient may need EGD or iron  infusion depending on workup for cardiac causes of dyspnea.  Labs here showed iron  16, saturation ratio 4, ferritin 3. Patient follows with hematology outpatient and last received an iron  infusion about 6 months ago.    -- Lasix  40 mg p.o. daily due to ongoing lower extremity edema -- IV pantoprazole  twice daily per GI -- Transfusion threshold <8 in CAD patient -- SCDs for DVT prophylaxis -- AM CBC, BMP -- PT/OT eval and treat -- Maintain 2 large-bore Ivs -- consider PO iron  supplement with vitamin C supplement pending GI recs

## 2023-10-01 NOTE — Anesthesia Preprocedure Evaluation (Signed)
 Anesthesia Evaluation  Patient identified by MRN, date of birth, ID band Patient awake    Reviewed: Allergy & Precautions, NPO status , Patient's Chart, lab work & pertinent test results  History of Anesthesia Complications Negative for: history of anesthetic complications  Airway Mallampati: I  TM Distance: >3 FB Neck ROM: Full    Dental  (+) Edentulous Upper, Edentulous Lower   Pulmonary COPD,  COPD inhaler, Current Smoker and Patient abstained from smoking.   breath sounds clear to auscultation       Cardiovascular hypertension, Pt. on medications + angina  + CAD   Rhythm:Regular Rate:Normal  10/01/2023 ECHO: EF 65 to 70%.  1. The LV has normal function, no regional wall motion abnormalities. There is mild LVH. Left ventricular diastolic parameters were normal.   2. RVF is normal. The right ventricular size is normal. Tricuspid regurgitation signal is inadequate for assessing PA pressure.   3. The mitral valve is normal in structure. Trivial mitral valve regurgitation. No evidence of mitral stenosis.   4. The aortic valve is tricuspid. Aortic valve regurgitation is not visualized. Aortic valve sclerosis is present, with no evidence of aortic valve stenosis.   '20 cath:  1. Significant single vessel coronary artery disease with moderately to severely calcified 80% mid LAD stenosis. 2. Mild, non-obstructive coronary artery disease involving the LCx and RCA    Neuro/Psych negative neurological ROS     GI/Hepatic Neg liver ROS,GERD  Controlled,,Small bowel AVMs   Endo/Other  diabetes (glu 103)  Semaglutide BMI 48  Renal/GU Renal InsufficiencyRenal disease     Musculoskeletal   Abdominal   Peds  Hematology  (+) Blood dyscrasia (Hb 8.4, plt 227k), anemia   Anesthesia Other Findings H/o breast cancer  Reproductive/Obstetrics                              Anesthesia Physical Anesthesia  Plan  ASA: 4  Anesthesia Plan: MAC   Post-op Pain Management: Minimal or no pain anticipated   Induction:   PONV Risk Score and Plan: 1 and Treatment may vary due to age or medical condition  Airway Management Planned: Natural Airway and Simple Face Mask  Additional Equipment: None  Intra-op Plan:   Post-operative Plan:   Informed Consent: I have reviewed the patients History and Physical, chart, labs and discussed the procedure including the risks, benefits and alternatives for the proposed anesthesia with the patient or authorized representative who has indicated his/her understanding and acceptance.   Patient has DNR.  Discussed DNR with patient and Suspend DNR.     Plan Discussed with: CRNA and Surgeon  Anesthesia Plan Comments:         Anesthesia Quick Evaluation

## 2023-10-01 NOTE — Care Management Obs Status (Signed)
 MEDICARE OBSERVATION STATUS NOTIFICATION   Patient Details  Name: June Rode MRN: [id number] Date of Birth: May 26, 1950   Medicare Observation Status Notification Given:  Yes    Ronni Colace, RN 10/01/2023, 1:21 PM

## 2023-10-01 NOTE — Assessment & Plan Note (Addendum)
 Type II DM: A1c 6.3, patient does not take any DM medicines at home CAD: 80% stenosis in LAD found on left heart cath in 2020, stent placed at that time. Continue isosorbide  30 mg daily HTN: Continue hydralazine  50 mg twice daily HLD: Continue Zetia  10 mg daily Crestor  20 mg daily COPD: Prescribed Breo Ellipta  and albuterol , states she has not taking Breo at home due to not knowing how to use it

## 2023-10-01 NOTE — Assessment & Plan Note (Addendum)
 History of low UOP on daily Lasix .  No urinary symptoms on admission.  Had cystoscopy with urethroscopy and stent placement in 2023.  UA showed large leukocytes, negative nitrites.  Had 800 mL UOP overnight. -- Bladder scan

## 2023-10-01 NOTE — Progress Notes (Signed)
*  PRELIMINARY RESULTS* Echocardiogram 2D Echocardiogram has been performed.  Glenna Lango 10/01/2023, 8:44 AM

## 2023-10-01 NOTE — Progress Notes (Signed)
 Daily Progress Note Intern Pager: [phone]  Patient name: [name] Cooper Medical record number: [id number] Date of birth: 02-17-1951 Age: 73 y.o. Gender: female  Primary Care Provider: Hershell Lose, NP Consultants: GI Code Status: DNR limited  Pt Overview and Major Events to Date:  6/12: Admitted to FMTS, 1 unit PRBCs  Assessment and Plan: [name] Cooper is a 73 year old female who presented with 3 weeks of dyspnea and peripheral edema likely due to symptomatic anemia most likely in the setting of a GI bleed given normal echo done today.  Patient has history of small bowel AVMs, tubular adenomas, and was found to be Hemoccult positive in the ED with hemoglobin 7.5 (threshold <8.0 in CAD patient).  Patient is now s/p 1 unit PRBCs with repeat hemoglobin stable. Pending GI recommendations for bleed, likely will need EGD.   PMH: CAD (s/p stent in 2020), IDA, COPD, HTN, small bowel AVMs, reflux esophagitis, adenomatous colon polyps Assessment & Plan Symptomatic anemia Echo normal.  AM Hgb 8.3 stable.  GI consulted at admission, patient may need EGD or iron  infusion depending on workup for cardiac causes of dyspnea.  Labs here showed iron  16, saturation ratio 4, ferritin 3. Patient follows with hematology outpatient and last received an iron  infusion about 6 months ago.    -- Lasix  40 mg p.o. daily due to ongoing lower extremity edema -- IV pantoprazole  twice daily per GI -- Transfusion threshold <8 in CAD patient -- SCDs for DVT prophylaxis -- AM CBC, BMP -- PT/OT eval and treat -- Maintain 2 large-bore Ivs -- consider PO iron  supplement with vitamin C supplement pending GI recs  Amber-colored urine History of low UOP on daily Lasix .  No urinary symptoms on admission.  Had cystoscopy with urethroscopy and stent placement in 2023.  UA showed large leukocytes, negative nitrites.  Had 800 mL UOP overnight. -- Bladder scan  Chronic health problem Type II DM: A1c 6.3, patient does  not take any DM medicines at home CAD: 80% stenosis in LAD found on left heart cath in 2020, stent placed at that time. Continue isosorbide  30 mg daily HTN: Continue hydralazine  50 mg twice daily HLD: Continue Zetia  10 mg daily Crestor  20 mg daily COPD: Prescribed Breo Ellipta  and albuterol , states she has not taking Breo at home due to not knowing how to use it    FEN/GI: Heart healthy PPx: SCDs Dispo: Pending clinical improvement  Subjective:  Patient states she feels much better this morning, states she was urinating a lot overnight.  Denies bleeding from anywhere in her body.  Objective: Temp:  [97.9 F (36.6 C)-99.5 F (37.5 C)] 99.1 F (37.3 C) (06/13 0804) Pulse Rate:  [61-88] 79 (06/13 0804) Resp:  [18] 18 (06/13 0804) BP: (123-148)/(51-86) 124/69 (06/13 0804) SpO2:  [91 %-100 %] 92 % (06/13 0804) Physical Exam: General: Resting comfortably in bed, awake and conversant, no acute distress Cardiovascular: RRR, normal S1/2, no M/R/G Respiratory: CTAB, normal WB on RA, no W/R/R Abdomen: Normoactive bowel sounds, soft, nontender, nondistended Extremities: Mild edema to BLE  Laboratory: Most recent CBC Lab Results  Component Value Date   WBC 9.3 10/01/2023   HGB 8.4 (L) 10/01/2023   HCT 29.6 (L) 10/01/2023   MCV 63.9 (L) 10/01/2023   PLT 227 10/01/2023   Most recent BMP    Latest Ref Rng & Units 10/01/2023   10:51 AM  BMP  Glucose 70 - 99 mg/dL 147   BUN 8 - 23 mg/dL 13  Creatinine 0.44 - 1.00 mg/dL 1.91   Sodium 478 - 295 mmol/L 140   Potassium 3.5 - 5.1 mmol/L 3.3   Chloride 98 - 111 mmol/L 104   CO2 22 - 32 mmol/L 24   Calcium  8.9 - 10.3 mg/dL 8.8     Other pertinent labs: A1c 6.3  Imaging/Diagnostic Tests: Repeat EKG: no concerning findings  Naida Austria, MD 10/01/2023, 1:34 PM  PGY-1, [facility] FPTS Intern pager: [phone], text pages welcome Secure chat group [facility] Teaching Service

## 2023-10-01 NOTE — Care Management Obs Status (Signed)
 MEDICARE OBSERVATION STATUS NOTIFICATION   Patient Details  Name: [name] Cooper MRN: [id number] Date of Birth: 1950/10/20   Medicare Observation Status Notification Given:  Yes  Letter signed and copy given  Wynonia Hedges 10/01/2023, 1:12 PM

## 2023-10-02 ENCOUNTER — Observation Stay (HOSPITAL_COMMUNITY): Admitting: Anesthesiology

## 2023-10-02 ENCOUNTER — Encounter (HOSPITAL_COMMUNITY)
Admission: EM | Disposition: A | Discharge status: Home / Self Care | Payer: Self-pay | Source: Ambulatory Visit | Attending: Family Medicine

## 2023-10-02 DIAGNOSIS — I25119 Atherosclerotic heart disease of native coronary artery with unspecified angina pectoris: Secondary | ICD-10-CM

## 2023-10-02 DIAGNOSIS — I2584 Coronary atherosclerosis due to calcified coronary lesion: Secondary | ICD-10-CM | POA: Diagnosis present

## 2023-10-02 DIAGNOSIS — K31811 Angiodysplasia of stomach and duodenum with bleeding: Secondary | ICD-10-CM | POA: Diagnosis present

## 2023-10-02 DIAGNOSIS — Z6841 Body Mass Index (BMI) 40.0 and over, adult: Secondary | ICD-10-CM | POA: Diagnosis not present

## 2023-10-02 DIAGNOSIS — K5520 Angiodysplasia of colon without hemorrhage: Secondary | ICD-10-CM

## 2023-10-02 DIAGNOSIS — Z66 Do not resuscitate: Secondary | ICD-10-CM | POA: Diagnosis present

## 2023-10-02 DIAGNOSIS — K922 Gastrointestinal hemorrhage, unspecified: Secondary | ICD-10-CM | POA: Diagnosis present

## 2023-10-02 DIAGNOSIS — Z79899 Other long term (current) drug therapy: Secondary | ICD-10-CM | POA: Diagnosis not present

## 2023-10-02 DIAGNOSIS — I129 Hypertensive chronic kidney disease with stage 1 through stage 4 chronic kidney disease, or unspecified chronic kidney disease: Secondary | ICD-10-CM | POA: Diagnosis present

## 2023-10-02 DIAGNOSIS — D509 Iron deficiency anemia, unspecified: Secondary | ICD-10-CM | POA: Diagnosis not present

## 2023-10-02 DIAGNOSIS — E6609 Other obesity due to excess calories: Secondary | ICD-10-CM | POA: Diagnosis present

## 2023-10-02 DIAGNOSIS — R600 Localized edema: Secondary | ICD-10-CM | POA: Insufficient documentation

## 2023-10-02 DIAGNOSIS — Z7982 Long term (current) use of aspirin: Secondary | ICD-10-CM | POA: Diagnosis not present

## 2023-10-02 DIAGNOSIS — K2100 Gastro-esophageal reflux disease with esophagitis, without bleeding: Secondary | ICD-10-CM | POA: Diagnosis present

## 2023-10-02 DIAGNOSIS — K449 Diaphragmatic hernia without obstruction or gangrene: Secondary | ICD-10-CM | POA: Diagnosis present

## 2023-10-02 DIAGNOSIS — Z955 Presence of coronary angioplasty implant and graft: Secondary | ICD-10-CM | POA: Diagnosis not present

## 2023-10-02 DIAGNOSIS — N1832 Chronic kidney disease, stage 3b: Secondary | ICD-10-CM | POA: Diagnosis present

## 2023-10-02 DIAGNOSIS — J449 Chronic obstructive pulmonary disease, unspecified: Secondary | ICD-10-CM | POA: Diagnosis present

## 2023-10-02 DIAGNOSIS — D500 Iron deficiency anemia secondary to blood loss (chronic): Secondary | ICD-10-CM | POA: Diagnosis present

## 2023-10-02 DIAGNOSIS — E1122 Type 2 diabetes mellitus with diabetic chronic kidney disease: Secondary | ICD-10-CM | POA: Diagnosis present

## 2023-10-02 DIAGNOSIS — F17210 Nicotine dependence, cigarettes, uncomplicated: Secondary | ICD-10-CM

## 2023-10-02 DIAGNOSIS — Z853 Personal history of malignant neoplasm of breast: Secondary | ICD-10-CM | POA: Diagnosis not present

## 2023-10-02 DIAGNOSIS — I2510 Atherosclerotic heart disease of native coronary artery without angina pectoris: Secondary | ICD-10-CM | POA: Diagnosis present

## 2023-10-02 DIAGNOSIS — Z8543 Personal history of malignant neoplasm of ovary: Secondary | ICD-10-CM | POA: Diagnosis not present

## 2023-10-02 DIAGNOSIS — Z87440 Personal history of urinary (tract) infections: Secondary | ICD-10-CM | POA: Diagnosis not present

## 2023-10-02 DIAGNOSIS — D649 Anemia, unspecified: Secondary | ICD-10-CM | POA: Diagnosis not present

## 2023-10-02 DIAGNOSIS — K5521 Angiodysplasia of colon with hemorrhage: Secondary | ICD-10-CM | POA: Diagnosis present

## 2023-10-02 DIAGNOSIS — E785 Hyperlipidemia, unspecified: Secondary | ICD-10-CM | POA: Diagnosis present

## 2023-10-02 DIAGNOSIS — Z9011 Acquired absence of right breast and nipple: Secondary | ICD-10-CM | POA: Diagnosis not present

## 2023-10-02 HISTORY — PX: SCLEROTHERAPY: SHX6841

## 2023-10-02 HISTORY — PX: ENTEROSCOPY: SHX5533

## 2023-10-02 HISTORY — PX: HOT HEMOSTASIS: SHX5433

## 2023-10-02 LAB — GLUCOSE, CAPILLARY
Glucose-Capillary: 103 mg/dL — ABNORMAL HIGH (ref 70–99)
Glucose-Capillary: 117 mg/dL — ABNORMAL HIGH (ref 70–99)

## 2023-10-02 LAB — CBC
HCT: 26.9 % — ABNORMAL LOW (ref 36.0–46.0)
Hemoglobin: 7.6 g/dL — ABNORMAL LOW (ref 12.0–15.0)
MCH: 18.1 pg — ABNORMAL LOW (ref 26.0–34.0)
MCHC: 28.3 g/dL — ABNORMAL LOW (ref 30.0–36.0)
MCV: 63.9 fL — ABNORMAL LOW (ref 80.0–100.0)
Platelets: 206 10*3/uL (ref 150–400)
RBC: 4.21 MIL/uL (ref 3.87–5.11)
RDW: 23 % — ABNORMAL HIGH (ref 11.5–15.5)
WBC: 7.5 10*3/uL (ref 4.0–10.5)
nRBC: 0.8 % — ABNORMAL HIGH (ref 0.0–0.2)

## 2023-10-02 LAB — BASIC METABOLIC PANEL WITH GFR
Anion gap: 8 (ref 5–15)
BUN: 10 mg/dL (ref 8–23)
CO2: 28 mmol/L (ref 22–32)
Calcium: 8.7 mg/dL — ABNORMAL LOW (ref 8.9–10.3)
Chloride: 105 mmol/L (ref 98–111)
Creatinine, Ser: 1.33 mg/dL — ABNORMAL HIGH (ref 0.44–1.00)
GFR, Estimated: 42 mL/min — ABNORMAL LOW (ref >60)
Glucose, Bld: 106 mg/dL — ABNORMAL HIGH (ref 70–99)
Potassium: 3.4 mmol/L — ABNORMAL LOW (ref 3.5–5.1)
Sodium: 141 mmol/L (ref 135–145)

## 2023-10-02 LAB — PREPARE RBC (CROSSMATCH)

## 2023-10-02 LAB — HEMOGLOBIN AND HEMATOCRIT, BLOOD
HCT: 34.5 % — ABNORMAL LOW (ref 36.0–46.0)
Hemoglobin: 10 g/dL — ABNORMAL LOW (ref 12.0–15.0)

## 2023-10-02 SURGERY — ENTEROSCOPY
Anesthesia: Monitor Anesthesia Care

## 2023-10-02 MED ORDER — POTASSIUM CHLORIDE CRYS ER 20 MEQ PO TBCR
40.0000 meq | EXTENDED_RELEASE_TABLET | Freq: Once | ORAL | Status: AC
  Filled 2023-10-02: qty 2

## 2023-10-02 MED ORDER — ALUM & MAG HYDROXIDE-SIMETH 200-200-20 MG/5ML PO SUSP: 30.0000 mL | Freq: Once | ORAL | Status: DC | PRN

## 2023-10-02 MED ORDER — SODIUM CHLORIDE 0.9 % IV SOLN: INTRAVENOUS | Status: DC | PRN

## 2023-10-02 MED ORDER — LIDOCAINE VISCOUS HCL 2 % MT SOLN
15.0000 mL | Freq: Once | OROMUCOSAL | Status: AC | PRN
Start: 2023-10-02 — End: 2023-10-02
  Filled 2023-10-02: qty 15

## 2023-10-02 MED ORDER — PHENOL 1.4 % MT LIQD
1.0000 | OROMUCOSAL | Status: DC | PRN
  Administered 2023-10-03: 1 via OROMUCOSAL
  Filled 2023-10-02: qty 177

## 2023-10-02 MED ORDER — SODIUM CHLORIDE 0.9 % IV SOLN: INTRAVENOUS | Status: DC

## 2023-10-02 MED ORDER — ALUM & MAG HYDROXIDE-SIMETH 200-200-20 MG/5ML PO SUSP
30.0000 mL | Freq: Once | ORAL | Status: AC
  Filled 2023-10-02: qty 30

## 2023-10-02 MED ORDER — SPOT INK MARKER SYRINGE KIT
PACK | SUBMUCOSAL | Status: DC | PRN
  Administered 2023-10-02: 2 mL via SUBMUCOSAL

## 2023-10-02 MED ORDER — SPOT INK MARKER SYRINGE KIT
PACK | SUBMUCOSAL | Status: AC
Start: 2023-10-02 — End: 2023-10-02
  Filled 2023-10-02: qty 5

## 2023-10-02 MED ORDER — PROPOFOL 500 MG/50ML IV EMUL: INTRAVENOUS | Status: DC | PRN

## 2023-10-02 MED ORDER — LIDOCAINE VISCOUS HCL 2 % MT SOLN
15.0000 mL | Freq: Once | OROMUCOSAL | Status: AC
  Filled 2023-10-02: qty 15

## 2023-10-02 MED ORDER — SODIUM CHLORIDE 0.9% IV SOLUTION: Freq: Once | INTRAVENOUS | Status: AC

## 2023-10-02 MED ORDER — SODIUM CHLORIDE 0.9 % IV SOLN
INTRAVENOUS | Status: AC | PRN
  Administered 2023-10-02: 250 mL via INTRAMUSCULAR

## 2023-10-02 MED ORDER — LIDOCAINE 2% (20 MG/ML) 5 ML SYRINGE
INTRAMUSCULAR | Status: DC | PRN
  Administered 2023-10-02: 30 mg via INTRAVENOUS

## 2023-10-02 NOTE — Op Note (Signed)
 [facility] Patient Name: [name] Cooper Procedure Date : 10/02/2023 MRN: [id number] Attending MD: Alvis Jourdain , MD, [id number] Date of Birth: May 31, 1950 CSN: [id number] Age: 73 Admit Type: Inpatient Procedure:                Small bowel enteroscopy Indications:              Iron  deficiency anemia Providers:                Alvis Jourdain, MD, Perla Bradford, RN, Arlin Benes,                            Technician, Dyanna Glasgow CRNA, CRNA Referring MD:              Medicines:                Propofol  per Anesthesia Complications:            No immediate complications. Estimated Blood Loss:     Estimated blood loss: none. Procedure:                Pre-Anesthesia Assessment:                           - Prior to the procedure, a History and Physical                            was performed, and patient medications and                            allergies were reviewed. The patient's tolerance of                            previous anesthesia was also reviewed. The risks                            and benefits of the procedure and the sedation                            options and risks were discussed with the patient.                            All questions were answered, and informed consent                            was obtained. Prior Anticoagulants: The patient has                            taken no anticoagulant or antiplatelet agents. ASA                            Grade Assessment: III - A patient with severe                            systemic disease. After reviewing the risks and  benefits, the patient was deemed in satisfactory                            condition to undergo the procedure.                           - Sedation was administered by an anesthesia                            professional. Deep sedation was attained.                           After obtaining informed consent, the endoscope was                             passed under direct vision. Throughout the                            procedure, the patient's blood pressure, pulse, and                            oxygen saturations were monitored continuously. The                            [id number]-HQ190L ([id number] Olympus colonoscope was                            introduced through the mouth and advanced to the                            small bowel distal to the Ligament of Treitz. The                            small bowel enteroscopy was technically difficult                            and complex. The patient tolerated the procedure                            well. Scope In: Scope Out: Findings:      A 3 cm hiatal hernia was present.      Three small angiodysplastic lesions with no bleeding were found in the       gastric body. Coagulation for tissue destruction using monopolar probe       was successful.      Multiple angiodysplastic lesions with no bleeding were found in the       entire examined duodenum. Coagulation for tissue destruction using       monopolar probe was successful.      Multiple angiodysplastic lesions with no bleeding were found in the       proximal jejunum, in the mid-jejunum and in the distal jejunum.       Coagulation for tissue destruction using monopolar probe was successful.      A tattoo was seen in the mid-jejunum. Area was tattooed with an  injection of 2 mL of Spot (carbon black).      Two deep passes into the jejunum were performed. Multiple nonbleeding       gastric and small bowel AVMs were identified. Ablation was performed on       most of the AVMs identified. Coughing, wretching, and small bowel       motility made it difficult to ablate every AVM. A tattoo was placed in       the small bowel to mark the most distal extent traversed. Impression:               - 3 cm hiatal hernia.                           - Three non-bleeding angiodysplastic lesions in the                            stomach. Treated  with a monopolar probe.                           - Multiple non-bleeding angiodysplastic lesions in                            the duodenum. Treated with a monopolar probe.                           - Multiple non-bleeding angiodysplastic lesions in                            the jejunum. Treated with a monopolar probe.                           - A tattoo was seen in the jejunum. Tattooed.                           - No specimens collected. Recommendation:           - Return patient to hospital ward for ongoing care.                           - Resume regular diet.                           - Follow HGB and transfuse if necessary. Procedure Code(s):        --- Professional ---                           [id number], Small intestinal endoscopy, enteroscopy                            beyond second portion of duodenum, not including                            ileum; with ablation of tumor(s), polyp(s), or                            other lesion(s) not amenable  to removal by hot                            biopsy forceps, bipolar cautery or snare technique                           44799, Unlisted procedure, small intestine Diagnosis Code(s):        --- Professional ---                           K55.20, Angiodysplasia of colon without hemorrhage                           K44.9, Diaphragmatic hernia without obstruction or                            gangrene                           K31.819, Angiodysplasia of stomach and duodenum                            without bleeding                           D50.9, Iron  deficiency anemia, unspecified CPT copyright 2022 American Medical Association. All rights reserved. The codes documented in this report are preliminary and upon coder review may  be revised to meet current compliance requirements. Alvis Jourdain, MD Alvis Jourdain, MD 10/02/2023 10:57:41 AM This report has been signed electronically. Number of Addenda: 0

## 2023-10-02 NOTE — Anesthesia Procedure Notes (Signed)
 Procedure Name: MAC Date/Time: 10/02/2023 9:55 AM  Performed by: Dyanna Glasgow, CRNAPre-anesthesia Checklist: Patient identified, Emergency Drugs available, Suction available, Patient being monitored and Timeout performed Patient Re-evaluated:Patient Re-evaluated prior to induction Oxygen Delivery Method: Nasal cannula Preoxygenation: Pre-oxygenation with 100% oxygen Induction Type: IV induction

## 2023-10-02 NOTE — Assessment & Plan Note (Signed)
 Has been on Lasix  40 mg p.o. daily.  Echo reassuringly normal; likely this is in the setting of venous stasis. -Continue Lasix 

## 2023-10-02 NOTE — Assessment & Plan Note (Signed)
 Hemoglobin 7.6 this morning, status post 1 unit PRBCs given transfusion threshold less than 8 due to CAD.  GI to take for enteroscopy today.  Consider iron  infusion in the future given saturation ratio 4. -Update post H&H -- IV pantoprazole  twice daily per GI -- Transfusion threshold <8 in CAD patient -- SCDs for DVT prophylaxis -- AM CBC, BMP -- PT/OT eval and treat -- Maintain 2 large-bore Ivs -- consider PO iron  supplement with vitamin C supplement pending GI recs

## 2023-10-02 NOTE — Plan of Care (Signed)

## 2023-10-02 NOTE — Assessment & Plan Note (Signed)
 In the history of cystoscopy and urethroscopy with stent placement in 2023.  UA with large leukocytes without nitrites.  Has continued on Lasix  with less than 1 L of urine output per day. -Follow-up on bladder scan today

## 2023-10-02 NOTE — Assessment & Plan Note (Signed)
 Type II DM: A1c 6.3, patient does not take any DM medicines at home CAD: 80% stenosis in LAD found on left heart cath in 2020, stent placed at that time. Continue isosorbide  30 mg daily HTN: Continue hydralazine  50 mg twice daily HLD: Continue Zetia  10 mg daily Crestor  20 mg daily COPD: Prescribed Breo Ellipta  and albuterol , states she has not taking Breo at home due to not knowing how to use it

## 2023-10-02 NOTE — Progress Notes (Signed)
 2100 Requesting Medicine for throat pain 9/10 sharp & aching, Had EGD earlier today. Refused Tylenol , stating it does not help.

## 2023-10-02 NOTE — Progress Notes (Signed)
 Daily Progress Note Intern Pager: [phone]  Patient name: [name] Cooper Medical record number: [id number] Date of birth: 08-10-1950 Age: 73 y.o. Gender: female  Primary Care Provider: Hershell Lose, NP Consultants: GI Code Status: DNR  Pt Overview and Major Events to Date:  6/12-admitted to [facility] TS, status post 1 unit PRBCs 6/14-status post 1 more unit of PRBCs, GI to take for enteroscopy  Assessment and Plan:  Patient is 72 year old female who presented with symptomatic anemia in the setting of a likely GI bleed with positive FOBT. 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  Assessment & Plan Symptomatic anemia Hemoglobin 7.6 this morning, status post 1 unit PRBCs given transfusion threshold less than 8 due to CAD.  GI to take for enteroscopy today.  Consider iron  infusion in the future given saturation ratio 4. -Update post H&H -- IV pantoprazole  twice daily per GI -- Transfusion threshold <8 in CAD patient -- SCDs for DVT prophylaxis -- AM CBC, BMP -- PT/OT eval and treat -- Maintain 2 large-bore Ivs -- consider PO iron  supplement with vitamin C supplement pending GI recs Lower extremity edema Has been on Lasix  40 mg p.o. daily.  Echo reassuringly normal; likely this is in the setting of venous stasis. -Continue Lasix  Amber-colored urine In the history of cystoscopy and urethroscopy with stent placement in 2023.  UA with large leukocytes without nitrites.  Has continued on Lasix  with less than 1 L of urine output per day. -Follow-up on bladder scan today Chronic health problem Type II DM: A1c 6.3, patient does not take any DM medicines at home CAD: 80% stenosis in LAD found on left heart cath in 2020, stent placed at that time. Continue isosorbide  30 mg daily HTN: Continue hydralazine  50 mg twice daily HLD: Continue Zetia  10 mg daily Crestor  20 mg daily COPD: Prescribed Breo  Ellipta and albuterol , states she has not taking Breo at home due to not knowing how to use it  FEN/GI: Carb modified diet PPx: SCDs Dispo:Pending PT recommendations  pending clinical improvement .   Subjective:  Doing well after her procedure overall, though her body hurts as well as her throat. She would like to rest it off for now.  Objective: Temp:  [98.5 F (36.9 C)-98.8 F (37.1 C)] 98.8 F (37.1 C) (06/14 0500) Pulse Rate:  [60-72] 60 (06/14 0500) Resp:  [17-18] 18 (06/14 0500) BP: (115-134)/(59-85) 115/59 (06/14 0500) SpO2:  [95 %-96 %] 95 % (06/14 0500) Physical Exam: General: Lying in bed, NAD Cardiovascular: RRR, no m/r/g Respiratory: CTAB anteriorly without w/r/r Abdomen: Soft, mildly tender to palpation centrally, nondistended Extremities: Moves all grossly equally  Laboratory: Most recent CBC Lab Results  Component Value Date   WBC 7.5 10/02/2023   HGB 7.6 (L) 10/02/2023   HCT 26.9 (L) 10/02/2023   MCV 63.9 (L) 10/02/2023   PLT 206 10/02/2023   Most recent BMP    Latest Ref Rng & Units 10/02/2023    3:58 AM  BMP  Glucose 70 - 99 mg/dL 147   BUN 8 - 23 mg/dL 10   Creatinine 8.29 - 1.00 mg/dL 5.62   Sodium 130 - 865 mmol/L 141   Potassium 3.5 - 5.1 mmol/L 3.4   Chloride 98 - 111 mmol/L 105   CO2 22 - 32 mmol/L 28   Calcium  8.9 - 10.3 mg/dL 8.7     Dema Filler, MD 10/02/2023, 8:08 AM  PGY-2, [facility] FPTS Intern pager: [phone], text pages welcome Secure chat group [facility] Teaching Service

## 2023-10-02 NOTE — Transfer of Care (Signed)
 Immediate Anesthesia Transfer of Care Note  Patient: [name] Cooper  Procedure(s) Performed: ENTEROSCOPY EGD, WITH ARGON PLASMA COAGULATION SCLEROTHERAPY  Patient Location: PACU  Anesthesia Type:MAC  Level of Consciousness: drowsy  Airway & Oxygen Therapy: Patient Spontanous Breathing and Patient connected to nasal cannula oxygen  Post-op Assessment: Report given to RN and Post -op Vital signs reviewed and stable  Post vital signs: Reviewed and stable  Last Vitals:  Vitals Value Taken Time  BP 124/64   Temp    Pulse 76   Resp 14   SpO2 95%     Last Pain:  Vitals:   10/02/23 0816  TempSrc: Temporal  PainSc: 0-No pain         Complications: No notable events documented.

## 2023-10-02 NOTE — Interval H&P Note (Signed)
 History and Physical Interval Note:  10/02/2023 8:45 AM  [name] Cooper  has presented today for surgery, with the diagnosis of Iron  deficiency anemia, history of small bowel AVMs.  The various methods of treatment have been discussed with the patient and family. After consideration of risks, benefits and other options for treatment, the patient has consented to  Procedure(s): ENTEROSCOPY (N/A) as a surgical intervention.  The patient's history has been reviewed, patient examined, no change in status, stable for surgery.  I have reviewed the patient's chart and labs.  Questions were answered to the patient's satisfaction.     Clementine Soulliere D

## 2023-10-02 NOTE — Anesthesia Postprocedure Evaluation (Signed)
 Anesthesia Post Note  Patient: Psychologist, forensic  Procedure(s) Performed: ENTEROSCOPY EGD, WITH ARGON PLASMA COAGULATION SCLEROTHERAPY     Patient location during evaluation: PACU Anesthesia Type: MAC Level of consciousness: awake and alert, oriented and patient cooperative Pain management: pain level controlled Vital Signs Assessment: post-procedure vital signs reviewed and stable Respiratory status: spontaneous breathing, nonlabored ventilation and respiratory function stable Cardiovascular status: blood pressure returned to baseline and stable Postop Assessment: no apparent nausea or vomiting Anesthetic complications: no   No notable events documented.  Last Vitals:  Vitals:   10/02/23 1100 10/02/23 1110  BP: 131/66 (!) 143/64  Pulse: 60 (!) 59  Resp: (!) 21 (!) 26  Temp:  36.6 C  SpO2: 95% 91%    Last Pain:  Vitals:   10/02/23 1110  TempSrc:   PainSc: Asleep                 Happy Ky,E. Tashi Band

## 2023-10-03 DIAGNOSIS — D649 Anemia, unspecified: Secondary | ICD-10-CM | POA: Diagnosis not present

## 2023-10-03 LAB — BASIC METABOLIC PANEL WITH GFR
Anion gap: 9 (ref 5–15)
BUN: 10 mg/dL (ref 8–23)
CO2: 26 mmol/L (ref 22–32)
Calcium: 8.5 mg/dL — ABNORMAL LOW (ref 8.9–10.3)
Chloride: 104 mmol/L (ref 98–111)
Creatinine, Ser: 1.25 mg/dL — ABNORMAL HIGH (ref 0.44–1.00)
GFR, Estimated: 46 mL/min — ABNORMAL LOW (ref >60)
Glucose, Bld: 104 mg/dL — ABNORMAL HIGH (ref 70–99)
Potassium: 3.6 mmol/L (ref 3.5–5.1)
Sodium: 139 mmol/L (ref 135–145)

## 2023-10-03 LAB — TYPE AND SCREEN
ABO/RH(D): O POS
Antibody Screen: POSITIVE
Donor AG Type: NEGATIVE
Donor AG Type: NEGATIVE
Unit division: 0
Unit division: 0

## 2023-10-03 LAB — BPAM RBC
Blood Product Expiration Date: 202507122359
Blood Product Expiration Date: 202507152359
ISSUE DATE / TIME: 202506122159
ISSUE DATE / TIME: 202506141402
Unit Type and Rh: 202507152359
Unit Type and Rh: 9500
Unit Type and Rh: 9500

## 2023-10-03 LAB — CBC
HCT: 29.7 % — ABNORMAL LOW (ref 36.0–46.0)
Hemoglobin: 8.7 g/dL — ABNORMAL LOW (ref 12.0–15.0)
MCH: 19.4 pg — ABNORMAL LOW (ref 26.0–34.0)
MCHC: 29.3 g/dL — ABNORMAL LOW (ref 30.0–36.0)
MCV: 66.3 fL — ABNORMAL LOW (ref 80.0–100.0)
Platelets: 198 10*3/uL (ref 150–400)
RBC: 4.48 MIL/uL (ref 3.87–5.11)
RDW: 26.1 % — ABNORMAL HIGH (ref 11.5–15.5)
WBC: 10.8 10*3/uL — ABNORMAL HIGH (ref 4.0–10.5)
nRBC: 3 % — ABNORMAL HIGH (ref 0.0–0.2)

## 2023-10-03 MED ORDER — PANTOPRAZOLE SODIUM 40 MG PO TBEC
40.0000 mg | DELAYED_RELEASE_TABLET | Freq: Two times a day (BID) | ORAL | Status: DC
  Administered 2023-10-03 – 2023-10-04 (×3): 40 mg via ORAL
  Filled 2023-10-03 (×3): qty 1

## 2023-10-03 NOTE — Progress Notes (Signed)
 Subjective: Feeling well.  Objective: Vital signs in last 24 hours: Temp:  [97.4 F (36.3 C)-99.3 F (37.4 C)] 98.7 F (37.1 C) (06/15 0752) Pulse Rate:  [59-74] 70 (06/15 0752) Resp:  [16-27] 17 (06/15 0752) BP: (122-144)/(63-75) 123/66 (06/15 0752) SpO2:  [88 %-97 %] 88 % (06/15 0752) Weight:  [135.6 kg] 135.6 kg (06/14 0816)    Intake/Output from previous day: 06/14 0701 - 06/15 0700 In: 825.3 [P.O.:354; I.V.:150; Blood:321.3] Out: 780 [Urine:780] Intake/Output this shift: No intake/output data recorded.  General appearance: alert and no distress GI: soft, non-tender; bowel sounds normal; no masses,  no organomegaly  Lab Results: Recent Labs    10/01/23 1051 10/02/23 0358 10/02/23 2057 10/03/23 0533  WBC 9.3 7.5  --  10.8*  HGB 8.4* 7.6* 10.0* 8.7*  HCT 29.6* 26.9* 34.5* 29.7*  PLT 227 206  --  198   BMET Recent Labs    10/01/23 1051 10/02/23 0358 10/03/23 0533  NA 140 141 139  K 3.3* 3.4* 3.6  CL 104 105 104  CO2 24 28 26   GLUCOSE 142* 106* 104*  BUN 13 10 10   CREATININE 1.39* 1.33* 1.25*  CALCIUM  8.8* 8.7* 8.5*   LFT No results for input(s): PROT, ALBUMIN, AST, ALT, ALKPHOS, BILITOT, BILIDIR, IBILI in the last 72 hours. PT/INR No results for input(s): LABPROT, INR in the last 72 hours. Hepatitis Panel No results for input(s): HEPBSAG, HCVAB, HEPAIGM, HEPBIGM in the last 72 hours. C-Diff No results for input(s): CDIFFTOX in the last 72 hours. Fecal Lactopherrin No results for input(s): FECLLACTOFRN in the last 72 hours.  Studies/Results: ECHOCARDIOGRAM COMPLETE Result Date: 10/01/2023    ECHOCARDIOGRAM REPORT   Patient Name:   [name] Cooper Date of Exam: 10/01/2023 Medical Rec #:  [id number]       Height:       66.0 in Accession #:    [id number]      Weight:       299.0 lb Date of Birth:  [birth year]       BSA:          2.373 m Patient Age:    72 years        BP:           124/57 mmHg Patient Gender: F                HR:           75 bpm. Exam Location:  Inpatient Procedure: 2D Echo, Color Doppler and Cardiac Doppler (Both Spectral and Color            Flow Doppler were utilized during procedure). Indications:    Dyspnea  History:        Patient has prior history of Echocardiogram examinations and                 Patient has no prior history of Echocardiogram examinations.  Sonographer:    Jeralene Mom Referring Phys: [id number] Blossom Burkes MCINTYRE IMPRESSIONS  1. Left ventricular ejection fraction, by estimation, is 65 to 70%. The left ventricle has normal function. The left ventricle has no regional wall motion abnormalities. There is mild left ventricular hypertrophy. Left ventricular diastolic parameters were normal.  2. Right ventricular systolic function is normal. The right ventricular size is normal. Tricuspid regurgitation signal is inadequate for assessing PA pressure.  3. The mitral valve is normal in structure. Trivial mitral valve regurgitation. No evidence of mitral stenosis.  4. The aortic valve is tricuspid. Aortic  valve regurgitation is not visualized. Aortic valve sclerosis is present, with no evidence of aortic valve stenosis. FINDINGS  Left Ventricle: Left ventricular ejection fraction, by estimation, is 65 to 70%. The left ventricle has normal function. The left ventricle has no regional wall motion abnormalities. The left ventricular internal cavity size was normal in size. There is  mild left ventricular hypertrophy. Left ventricular diastolic parameters were normal. Right Ventricle: The right ventricular size is normal. No increase in right ventricular wall thickness. Right ventricular systolic function is normal. Tricuspid regurgitation signal is inadequate for assessing PA pressure. Left Atrium: Left atrial size was normal in size. Right Atrium: Right atrial size was normal in size. Pericardium: There is no evidence of pericardial effusion. Mitral Valve: The mitral valve is normal in structure. Trivial  mitral valve regurgitation. No evidence of mitral valve stenosis. Tricuspid Valve: The tricuspid valve is normal in structure. Tricuspid valve regurgitation is trivial. Aortic Valve: The aortic valve is tricuspid. Aortic valve regurgitation is not visualized. Aortic valve sclerosis is present, with no evidence of aortic valve stenosis. Aortic valve mean gradient measures 6.0 mmHg. Aortic valve peak gradient measures 11.4 mmHg. Aortic valve area, by VTI measures 3.17 cm. Pulmonic Valve: The pulmonic valve was grossly normal. Pulmonic valve regurgitation is trivial. Aorta: The aortic root and ascending aorta are structurally normal, with no evidence of dilitation. IAS/Shunts: The atrial septum is grossly normal.  LEFT VENTRICLE PLAX 2D LVIDd:         4.10 cm   Diastology LVIDs:         2.60 cm   LV e' medial:    8.62 cm/s LV PW:         1.10 cm   LV E/e' medial:  7.8 LV IVS:        1.00 cm   LV e' lateral:   11.15 cm/s LVOT diam:     2.00 cm   LV E/e' lateral: 6.0 LV SV:         105 LV SV Index:   44 LVOT Area:     3.14 cm  RIGHT VENTRICLE RV Basal diam:  4.10 cm RV Mid diam:    3.60 cm RV S prime:     20.70 cm/s TAPSE (M-mode): 2.1 cm LEFT ATRIUM             Index        RIGHT ATRIUM           Index LA diam:        4.00 cm 1.69 cm/m   RA Area:     16.90 cm LA Vol (A2C):   68.3 ml 28.79 ml/m  RA Volume:   43.20 ml  18.21 ml/m LA Vol (A4C):   33.0 ml 13.91 ml/m LA Biplane Vol: 49.4 ml 20.82 ml/m  AORTIC VALVE AV Area (Vmax):    3.20 cm AV Area (Vmean):   3.15 cm AV Area (VTI):     3.17 cm AV Vmax:           169.00 cm/s AV Vmean:          110.667 cm/s AV VTI:            0.331 m AV Peak Grad:      11.4 mmHg AV Mean Grad:      6.0 mmHg LVOT Vmax:         172.00 cm/s LVOT Vmean:        111.000 cm/s LVOT VTI:  0.334 m LVOT/AV VTI ratio: 1.01  AORTA Ao Root diam: 3.00 cm Ao Asc diam:  2.90 cm MITRAL VALVE MV Area (PHT): 2.91 cm     SHUNTS MV Decel Time: 261 msec     Systemic VTI:  0.33 m MV E velocity:  67.10 cm/s   Systemic Diam: 2.00 cm MV A velocity: 118.00 cm/s MV E/A ratio:  0.57 Carson Clara MD Electronically signed by Carson Clara MD Signature Date/Time: 10/01/2023/9:47:22 AM    Final     Medications: Scheduled:  ezetimibe   10 mg Oral Daily   fluticasone  furoate-vilanterol  1 puff Inhalation Daily   furosemide   40 mg Oral Daily   gabapentin   300 mg Oral QHS   hydrALAZINE   50 mg Oral BID   isosorbide  mononitrate  30 mg Oral Daily   pantoprazole  (PROTONIX ) IV  40 mg Intravenous BID   rosuvastatin   20 mg Oral Daily   Continuous:  Assessment/Plan: 1) 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 2) Anemia.   The patient is stable.  There was significant drop in her HG from last evening and this AM's report, however, this does not correlate clinically.  Her current HGB is consistent with her admission values.  Plan: 1) Continue to monitor HGB. 2) If HGB is stable she can have outpatient follow up with [facility]. 3) Signing off, but call [facility] if there are any questions.  LOS: 2 days   Deondra Labrador D 10/03/2023, 7:56 AM

## 2023-10-03 NOTE — Progress Notes (Signed)
 Daily Progress Note Intern Pager: [phone]  Patient name: [name] Cooper Medical record number: [id number] Date of birth: Mar 03, 1951 Age: 73 y.o. 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  Patient is doing well with stable hemoglobin after receiving 2 unit PRBCs since admission.,  Has also received IV iron  infusion during admission.  Will plan to trend CBCs for 1 more day and discharge tomorrow morning if patient remains stable. Pertinent PMH/PSH includes small bowel AVMs, tubular adenomas, CAD s/p stent in 2020, IDA, COPD, hypertension.  Assessment & Plan Symptomatic anemia Hemoglobin stable this a.m. at 8.7.  Post H&H 10.0 last night, per GI note, hemoglobin appears stable.  GI has signed off.  S/p IV iron  infusion on 6/13. --Patient should follow-up outpatient with  GI -- CTM with a.m. CBC -- P.o. Protonix  40 mg twice daily, patient should continue this for about 2 weeks per GI -- Transfusion threshold <8 in CAD patient --Maintain 2 large-bore IVs -- Continue SCDs for DVT prophylaxis -- PT/OT evaluated, no outpatient needs Lower extremity edema Was taking Lasix  40 mg p.o. daily prior to admission.  Echo normal, symptoms likely in the setting of venous stasis. -- Continue Lasix  40 mg p.o. daily  Amber-colored urine History of cytoscopy and urethroscopy with stent placement 2023.  UA without evidence of acute infection on admission.  Has had <1L UOP daily.  Bladder scan on 6/14 with 80 mL. -- CTM -- Recommend outpatient follow-up with nephrology Chronic health  problem DM 2: A1c 6.3, CBGs WNL CAD: S/p stent in 2020.  Continue isosorbide  30 mg daily HTN: Continue hydralazine  50 mg twice daily HLD: Continue Zetia  10 mg daily and Crestor  20 mg daily COPD: Has not been using Breo due to lack of understanding of how to use it, recommend PCP provide education    FEN/GI: Regular PPx: SCDs given concern for ongoing bleed Dispo: Pending clinical improvement  Subjective:  Patient states she is feeling well this morning, does not have any acute complaints or concerns.  Denies abdominal pain or visible bleeding.  States she feels that leg swelling has improved.  Objective: Temp:  [97.4 F (36.3 C)-99.3 F (37.4 C)] 99.3 F (37.4 C) (06/15 0443) Pulse Rate:  [59-74] 74 (06/15 0443) Resp:  [16-27] 18 (06/15 0443) BP: (122-144)/(63-75) 122/64 (06/15 0443) SpO2:  [89 %-97 %] 93 % (06/15 0443) Weight:  [135.6 kg] 135.6 kg (06/14 0816) Physical Exam: General: Resting comfortably in bed, awakens to voice, in no acute distress Cardiovascular: RRR, normal S1/S2, no M/R/G Respiratory: CTAB, normal WOB on RA, no W/R/R Abdomen: Normoactive bowel sounds, soft, nontender, nondistended Extremities: BLE edema from mid shin down  Laboratory: Most recent CBC Lab Results  Component Value Date   WBC 7.5 10/02/2023   HGB 10.0 (L) 10/02/2023   HCT 34.5 (L) 10/02/2023   MCV 63.9 (L) 10/02/2023   PLT 206 10/02/2023   Most recent BMP    Latest Ref Rng & Units 10/03/2023    5:33 AM  BMP  Glucose 70 - 99 mg/dL 147   BUN 8 -  23 mg/dL 10   Creatinine 1.61 - 1.00 mg/dL 0.96   Sodium 045 - 409 mmol/L 139   Potassium 3.5 - 5.1 mmol/L 3.6   Chloride 98 - 111 mmol/L 104   CO2 22 - 32 mmol/L 26   Calcium  8.9 - 10.3 mg/dL 8.5       Imaging/Diagnostic Tests: None  [name] Austria, MD 10/03/2023, 7:05 AM  PGY-1, [facility] FPTS Intern pager: [phone], text pages welcome Secure chat group [facility] Teaching Service

## 2023-10-03 NOTE — Assessment & Plan Note (Addendum)
 Hemoglobin stable this a.m. at 8.7.  Post H&H 10.0 last night, per GI note, hemoglobin appears stable.  GI has signed off.  S/p IV iron  infusion on 6/13. --Patient should follow-up outpatient with [facility] -- CTM with a.m. CBC -- P.o. Protonix  40 mg twice daily, patient should continue this for about 2 weeks per GI -- Transfusion threshold <8 in CAD patient --Maintain 2 large-bore IVs -- Continue SCDs for DVT prophylaxis -- PT/OT evaluated, no outpatient needs

## 2023-10-03 NOTE — Assessment & Plan Note (Addendum)
 History of cytoscopy and urethroscopy with stent placement 2023.  UA without evidence of acute infection on admission.  Has had <1L UOP daily.  Bladder scan on 6/14 with 80 mL. -- CTM -- Recommend outpatient follow-up with nephrology

## 2023-10-03 NOTE — Plan of Care (Signed)

## 2023-10-03 NOTE — Assessment & Plan Note (Addendum)
 DM 2: A1c 6.3, CBGs WNL CAD: S/p stent in 2020.  Continue isosorbide  30 mg daily HTN: Continue hydralazine  50 mg twice daily HLD: Continue Zetia  10 mg daily and Crestor  20 mg daily COPD: Has not been using Breo due to lack of understanding of how to use it, recommend PCP provide education

## 2023-10-03 NOTE — Assessment & Plan Note (Addendum)
 Was taking Lasix  40 mg p.o. daily prior to admission.  Echo normal, symptoms likely in the setting of venous stasis. -- Continue Lasix  40 mg p.o. daily

## 2023-10-04 ENCOUNTER — Other Ambulatory Visit (HOSPITAL_COMMUNITY): Payer: Self-pay

## 2023-10-04 ENCOUNTER — Encounter (HOSPITAL_COMMUNITY): Payer: Self-pay | Admitting: Gastroenterology

## 2023-10-04 DIAGNOSIS — D649 Anemia, unspecified: Secondary | ICD-10-CM | POA: Diagnosis not present

## 2023-10-04 LAB — CBC
HCT: 33.3 % — ABNORMAL LOW (ref 36.0–46.0)
Hemoglobin: 9.6 g/dL — ABNORMAL LOW (ref 12.0–15.0)
MCH: 19.2 pg — ABNORMAL LOW (ref 26.0–34.0)
MCHC: 28.8 g/dL — ABNORMAL LOW (ref 30.0–36.0)
MCV: 66.7 fL — ABNORMAL LOW (ref 80.0–100.0)
Platelets: 207 10*3/uL (ref 150–400)
RBC: 4.99 MIL/uL (ref 3.87–5.11)
RDW: 27.3 % — ABNORMAL HIGH (ref 11.5–15.5)
WBC: 9.2 10*3/uL (ref 4.0–10.5)
nRBC: 3.8 % — ABNORMAL HIGH (ref 0.0–0.2)

## 2023-10-04 MED ORDER — FUROSEMIDE 40 MG PO TABS
40.0000 mg | ORAL_TABLET | Freq: Every day | ORAL | 0 refills | Status: AC
Start: 2023-10-04 — End: ?
  Filled 2023-10-04: qty 30, 30d supply, fill #0

## 2023-10-04 MED ORDER — PANTOPRAZOLE SODIUM 40 MG PO TBEC
40.0000 mg | DELAYED_RELEASE_TABLET | Freq: Two times a day (BID) | ORAL | 0 refills | Status: AC
  Filled 2023-10-04: qty 28, 14d supply, fill #0

## 2023-10-04 NOTE — Assessment & Plan Note (Deleted)
 Placed on 2 L [place] this a.m. due to oxygen sats in the high 80s.  Patient does not use oxygen at home.  Lungs were clear on exam and patient appeared well.  Hemoglobin stable at 9.6.  -- Consider CXR if unable to wean off oxygen --Ambulatory pulse ox -- Continuous pulse ox added  Hemoglobin stable this a.m. at 8.7.  Post H&H 10.0 last night, per GI note, hemoglobin appears stable.  GI has signed off.  S/p IV iron  infusion on 6/13. --Patient should follow-up outpatient with [facility] -- CTM with a.m. CBC -- P.o. Protonix  40 mg twice daily, patient should continue this for about 2 weeks per GI -- Transfusion threshold <8 in CAD patient --Maintain 2 large-bore IVs -- Continue SCDs for DVT prophylaxis -- PT/OT evaluated, no outpatient needs

## 2023-10-04 NOTE — Progress Notes (Signed)
 Transition of Care [facility]) - Inpatient Brief Assessment   Patient Details  Name: [name] Cooper MRN: [id number] Date of Birth: 1950/11/26  [facility]) CM/SW Contact:    Dane Dung, RN Phone Number: 10/04/2023, 11:11 AM   Clinical Narrative: Patient admitted for symptomatic anemia.  No TOC needs at this time.  I spoke with RN Case manager with [facility] and she was updated that patient would likely discharge home today.    No TOC needs have been determined after screening.   Transition of Care Asessment: Insurance and Status: (P) Insurance coverage has been reviewed Patient has primary care physician: (P) Yes Home environment has been reviewed: (P) from home   Prior/Current Home Services: (P) No current home services Social Drivers of Health Review: (P) SDOH reviewed no interventions necessary Readmission risk has been reviewed: (P) Yes Transition of care needs: (P) no transition of care needs at this time

## 2023-10-04 NOTE — Discharge Instructions (Addendum)
 Dear [name] Cooper,   Thank you for letting us  participate in your care! We treated you for anemia by giving you blood transfusions and iron  and you had a scope of your small bowel. We also gave you your home Lasix  to help with your leg swelling. You should follow up with the gastroenterologists to make sure everything still looks good after the scope.   POST-HOSPITAL & CARE INSTRUCTIONS We recommend following up with your PCP within 1 week from being discharged from the hospital. Please let PCP/Specialists know of any changes in medications that were made which you will be able to see in the medications section of this packet. Please also follow up GI  DOCTOR'S APPOINTMENTS & FOLLOW UP Future Appointments  Date Time Provider Department Center  10/28/2023  1:00 PM [facility] None  10/28/2023  1:30 PM Marlene Simas, MD [facility] None  11/10/2023  9:20 AM Patwardhan, Kaye Parsons, MD [facility]  01/04/2024  1:45 PM Luella Sager, DPM [facility]     Thank you for choosing [facility]! Take care and be well!  Family Medicine Teaching Service Inpatient Team [place]  [facility]  [address] [place], [place] [address] [phone]

## 2023-10-04 NOTE — Assessment & Plan Note (Deleted)
   History of cytoscopy and urethroscopy with stent placement 2023.  UA without evidence of acute infection on admission.  Has had <1L UOP daily.  Bladder scan on 6/14 with 80 mL. -- CTM -- Recommend outpatient follow-up with nephrology

## 2023-10-04 NOTE — Assessment & Plan Note (Deleted)
 DM 2: A1c 6.3, CBGs WNL CAD: S/p stent in 2020.  Continue isosorbide  30 mg daily HTN: Continue hydralazine  50 mg twice daily HLD: Continue Zetia  10 mg daily and Crestor  20 mg daily COPD: Has not been using Breo due to lack of understanding of how to use it, recommend PCP provide education

## 2023-10-04 NOTE — Plan of Care (Signed)

## 2023-10-04 NOTE — Discharge Summary (Addendum)
 Family Medicine Teaching [facility] Discharge Summary  Patient name: [name] Cooper Medical record number: [id number] Date of birth: December 24, 1950 Age: 73 y.o. Gender: female Date of Admission: 09/30/2023  Date of Discharge: 10/04/2023 Admitting Physician: Clyda Dark, DO  Primary Care Provider: Hershell Lose, NP Consultants: GI  Indication for Hospitalization: Anemia  Discharge Diagnoses/Problem List:  Principal Problem for Admission: Anemia Other Problems addressed during stay:  Principal Problem:   Symptomatic anemia Active Problems:   Chronic health problem   Amber-colored urine   GI bleed   Lower extremity edema    Brief Hospital Course:  [name] Cooper is a 73 y.o.female with a history of CAD, , who was admitted to the [facility] Medicine Teaching Service at [facility] for symptomatic anemia due to GI bleed. Her hospital course is detailed below:  Acute on chronic anemia: Admitted for exertional dyspnea for few weeks and history of small bowel AVMs.  Hemoglobin on admission 7.5.  In the ED she was Hemoccult positive.  GI was consulted and recommended inpatient EGD.  Iron  studies were obtained and showed iron  deficiency anemia most likely secondary to blood loss.  EGD was performed on 10/02/2023 and showed multiple nonbleeding AVMs in the gastric body, duodenum, and jejunum; these were ligated.  Hemoglobin remained stable over admission without evidence of overt bleeding.  GI recommended continuing PPI for 2 more weeks and following up in clinic.  Hemoglobin was 9.6 on the day of discharge.  Dyspnea on exertion BLE edema Secondary to anemia as above. Reassuringly EKG and troponins were within normal limits.  Patient also had lower extremity edema so echo was completed on 6/13 and showed LVEF 65 to 70%, mild LVH, normal RV function, no valvular dysfunction.  Received home 40 mg Lasix  daily. Of note, on day of discharge patient had documented desaturations below 88%. Pt was very  briefly placed on 2L [place] with improvement reported by nursing staff. Shortly thereafter patient underwent walk test and was able to maintain normal oxygen saturation on room air while ambulating. Episode was not felt to represent true hypoxia. She does not require oxygen at home.   Low UOP Patient was found to have amber-colored urine and relatively low UOP during admission (<1L daily).  UA negative for acute infection.  Bladder scan normal.  Recommend outpatient follow-up with nephrology.  Creatinine remained at baseline throughout hospitalization.  Other chronic conditions were medically managed with home medications and formulary alternatives as necessary (DM 2, CAD, HTN, HLD, CKD 3)  PCP Follow-up Recommendations: Continue PPI BID until end of June per GI Recheck iron  panel in 4-8 weeks Ensure adherence to p.o. iron  and vitamin C supplement Follow up with nephrology given low UOP  Provide inhaler use education as patient was not adherent to COPD treatment prior to admission   Disposition: Home  Discharge Condition: Stable  Discharge Exam:  Vitals:   10/04/23 0740 10/04/23 0835  BP: 129/66   Pulse: 68 68  Resp: 18 18  Temp: 98.6 F (37 C)   SpO2: 92%    General: Sitting up on side of the bed, awake conversant, no acute distress CV: RRR, normal S1/S2, no M/R/G Pulm: CTAB, normal WB on RA, no W/R/R ABD: Normotensive bowel sounds, soft, nontender, nondistended Extremities: No edema to BLE  Significant Procedures: Small bowel endoscopy 6/14-gastric and small bowel AVMs s/p ligation  Significant Labs and Imaging:  Recent Labs  Lab 10/02/23 2057 10/03/23 0533 10/04/23 0424  WBC  --  10.8* 9.2  HGB 10.0*  8.7* 9.6*  HCT 34.5* 29.7* 33.3*  PLT  --  198 207   Recent Labs  Lab 10/03/23 0533  NA 139  K 3.6  CL 104  CO2 26  GLUCOSE 104*  BUN 10  CREATININE 1.25*  CALCIUM  8.5*    None  Results/Tests Pending at Time of Discharge: None  Discharge Medications:   Allergies as of 10/04/2023       Reactions   Lisinopril Anaphylaxis   Codeine    REACTION: MAKES HER SLEEPY        Medication List     STOP taking these medications    aspirin  EC 81 MG tablet   cyclobenzaprine 10 MG tablet Commonly known as: FLEXERIL   HYDROcodone -acetaminophen  5-325 MG tablet Commonly known as: NORCO/VICODIN   traMADol  50 MG tablet Commonly known as: ULTRAM        TAKE these medications    acetaminophen  650 MG CR tablet Commonly known as: TYLENOL  Take 1,300 mg by mouth every 8 (eight) hours as needed for pain.   albuterol  108 (90 Base) MCG/ACT inhaler Commonly known as: VENTOLIN  HFA Inhale 2 puffs into the lungs every 6 (six) hours as needed for wheezing or shortness of breath.   Breo Ellipta  100-25 MCG/ACT Aepb Generic drug: fluticasone  furoate-vilanterol Inhale 1 puff into the lungs daily.   cholecalciferol 25 MCG (1000 UNIT) tablet Commonly known as: VITAMIN D3 Take 1,000 Units by mouth daily.   ezetimibe  10 MG tablet Commonly known as: ZETIA  Take 1 tablet (10 mg total) by mouth daily.   ferrous sulfate 325 (65 FE) MG EC tablet Take 325 mg by mouth daily at 12 noon.   furosemide  40 MG tablet Commonly known as: LASIX  Take 1 tablet (40 mg total) by mouth daily. What changed:  medication strength when to take this reasons to take this   gabapentin  300 MG capsule Commonly known as: NEURONTIN  Take 300 mg by mouth at bedtime.   hydrALAZINE  50 MG tablet Commonly known as: APRESOLINE  Take 50 mg by mouth 2 (two) times daily.   isosorbide  mononitrate 30 MG 24 hr tablet Commonly known as: IMDUR  TAKE 1 TABLET BY MOUTH ONCE DAILY . APPOINTMENT REQUIRED FOR FUTURE REFILLS PLEASE  CALL  [phone]   nitroGLYCERIN  0.4 MG SL tablet Commonly known as: Nitrostat  Place 1 tablet (0.4 mg total) under the tongue every 5 (five) minutes as needed for chest pain.   pantoprazole  40 MG tablet Commonly known as: PROTONIX  Take 1 tablet (40 mg  total) by mouth 2 (two) times daily.   potassium chloride 10 MEQ CR capsule Commonly known as: MICRO-K Take 10 mEq by mouth every morning.   rosuvastatin  20 MG tablet Commonly known as: CRESTOR  Take 1 tablet by mouth once daily   Semaglutide 14 MG Tabs Take 1 tablet by mouth every morning.   Vitamin C 500 MG Caps Take 500 mg by mouth daily.        Discharge Instructions: Please refer to Patient Instructions section of EMR for full details.  Patient was counseled important signs and symptoms that should prompt return to medical care, changes in medications, dietary instructions, activity restrictions, and follow up appointments.   Follow-Up Appointments:  Follow-up Information     [facility]. Call.   Specialty: Gastroenterology Contact information: [address] [place]  [id number] [phone]                Naida Austria, MD 10/04/2023, 11:51 AM PGY-1, [facility]

## 2023-10-04 NOTE — Assessment & Plan Note (Deleted)
   Was taking Lasix  40 mg p.o. daily prior to admission.  Echo normal, symptoms likely in the setting of venous stasis. -- Continue Lasix  40 mg p.o. daily

## 2023-10-11 ENCOUNTER — Other Ambulatory Visit: Payer: Self-pay

## 2023-10-11 ENCOUNTER — Ambulatory Visit: Attending: Cardiology | Admitting: Cardiology

## 2023-10-11 ENCOUNTER — Encounter: Payer: Self-pay | Admitting: Cardiology

## 2023-10-11 VITALS — BP 106/71 | HR 77 | Ht 66.0 in | Wt 287.0 lb

## 2023-10-11 DIAGNOSIS — E782 Mixed hyperlipidemia: Secondary | ICD-10-CM | POA: Diagnosis not present

## 2023-10-11 DIAGNOSIS — I2510 Atherosclerotic heart disease of native coronary artery without angina pectoris: Secondary | ICD-10-CM | POA: Diagnosis not present

## 2023-10-11 DIAGNOSIS — E7800 Pure hypercholesterolemia, unspecified: Secondary | ICD-10-CM

## 2023-10-11 NOTE — Progress Notes (Signed)
 Cardiology Office Note:  .   Date:  10/11/2023  ID:  [name] Cooper, DOB October 11, 1950, MRN [id number] PCP: Leontine Cramp, NP   HeartCare Providers Cardiologist:  Newman Lawrence, MD PCP: Leontine Cramp, NP  Chief Complaint  Patient presents with   Coronary Artery Disease     [name] Cooper is a 73 y.o. female with hypertension, hyperlipidemia, COPD, CAD, symptomatic anemia  Discussed the use of AI scribe software for clinical note transcription with the patient, who gave verbal consent to proceed.  History of Present Illness  Patient was last seen by Dr. Jeffrie in 02/2022.  In the past, I reports an entity stenosis was managed medically given absence of anginal symptoms.  More recently, she was hospitalized with symptomatic anemia due to small bowel AVMs.  Hemoglobin was down to as low as 7.5.  She is now on iron  supplements.  She does not have any scheduled follow-up with gastroenterology.  She has noticed melena, but has been stable and she attributes this to taking iron  supplements.  She denies any chest pain or shortness of breath.  Leg swelling is mild.    Vitals:   10/11/23 1431  BP: 106/71  Pulse: 77  SpO2: 92%      Review of Systems  Cardiovascular:  Negative for chest pain, dyspnea on exertion, leg swelling, palpitations and syncope.        Studies Reviewed: SABRA        Labs 09/2023: HbA1C 6.3% Hb 9.6 Cr 1.25, eGFR 46  2023: Chol 110, TG 73, HDL 56, LDL 39  Echocardiogram 09/2023:  1. Left ventricular ejection fraction, by estimation, is 65 to 70%. The  left ventricle has normal function. The left ventricle has no regional  wall motion abnormalities. There is mild left ventricular hypertrophy.  Left ventricular diastolic parameters  were normal.   2. Right ventricular systolic function is normal. The right ventricular  size is normal. Tricuspid regurgitation signal is inadequate for assessing  PA pressure.   3. The mitral valve is normal in  structure. Trivial mitral valve  regurgitation. No evidence of mitral stenosis.   4. The aortic valve is tricuspid. Aortic valve regurgitation is not  visualized. Aortic valve sclerosis is present, with no evidence of aortic  valve stenosis.   Coronary angiogram 2020: Significant single vessel coronary artery disease with moderately to severely calcified 80% mid LAD stenosis. Mild, non-obstructive coronary artery disease involving the LCx and RCA. Normal left ventricular systolic function and filling pressure.    Physical Exam Vitals and nursing note reviewed.  Constitutional:      General: She is not in acute distress. Neck:     Vascular: No JVD.   Cardiovascular:     Rate and Rhythm: Normal rate and regular rhythm.     Heart sounds: Normal heart sounds. No murmur heard. Pulmonary:     Effort: Pulmonary effort is normal.     Breath sounds: Normal breath sounds. No wheezing or rales.   Musculoskeletal:     Right lower leg: Edema (Trace) present.     Left lower leg: Edema (Trace) present.      VISIT DIAGNOSES:   ICD-10-CM   1. Mixed hyperlipidemia  E78.2 Lipid panel    2. Coronary artery disease involving native coronary artery of native heart without angina pectoris  I25.10        [name] Cooper is a 73 y.o. female with hypertension, hyperlipidemia, COPD, CAD, symptomatic anemia Assessment & Plan  CAD: Noted to have  80% LAD stenosis on cardiac catheterization in 2020.  No angina symptoms at this time.  Given her recent GI bleeding, she is not on any antiplatelet therapy at this time.  Recent echocardiogram was reassuring.  Continue lipid-lowering therapy with statin and Zetia .  Check lipid panel.  Hypertension: Controlled  Mixed hyperlipidemia: Controlled  Symptomatic anemia: Defer management to PCP, does need follow-up with gastroenterology.    F/u in 6 months  Signed, Newman JINNY Lawrence, MD

## 2023-10-11 NOTE — Patient Instructions (Signed)
 Medication Instructions:  Your physician recommends that you continue on your current medications as directed. Please refer to the Current Medication list given to you today.  *If you need a refill on your cardiac medications before your next appointment, please call your pharmacy*  Lab Work: Your physician recommends that you have lab work today- Lipid panel If you have labs (blood work) drawn today and your tests are completely normal, you will receive your results only by: MyChart Message (if you have MyChart) OR A paper copy in the mail If you have any lab test that is abnormal or we need to change your treatment, we will call you to review the results.  Testing/Procedures: None ordered today.  Follow-Up: At [facility], you and your health needs are our priority.  As part of our continuing mission to provide you with exceptional heart care, our providers are all part of one team.  This team includes your primary Cardiologist (physician) and Advanced Practice Providers or APPs (Physician Assistants and Nurse Practitioners) who all work together to provide you with the care you need, when you need it.  Your next appointment:   6 month(s)  Provider:   Dr. Elmira    We recommend signing up for the patient portal called MyChart.  Sign up information is provided on this After Visit Summary.  MyChart is used to connect with patients for Virtual Visits (Telemedicine).  Patients are able to view lab/test results, encounter notes, upcoming appointments, etc.  Non-urgent messages can be sent to your provider as well.   To learn more about what you can do with MyChart, go to [email or web address].

## 2023-10-12 ENCOUNTER — Ambulatory Visit: Payer: Self-pay

## 2023-10-12 LAB — LIPID PANEL
Chol/HDL Ratio: 2.1 ratio (ref 0.0–4.4)
Cholesterol, Total: 140 mg/dL (ref 100–199)
HDL: 66 mg/dL (ref >39)
LDL Chol Calc (NIH): 60 mg/dL (ref 0–99)
Triglycerides: 72 mg/dL (ref 0–149)
VLDL Cholesterol Cal: 14 mg/dL (ref 5–40)

## 2023-10-26 ENCOUNTER — Telehealth: Payer: Self-pay | Admitting: Nurse Practitioner

## 2023-10-26 NOTE — Telephone Encounter (Signed)
 Rescheduled appointments with the patient per provider out of office. Confirmed appointment changes with the patient.

## 2023-10-28 ENCOUNTER — Other Ambulatory Visit: Payer: Self-pay | Admitting: Nurse Practitioner

## 2023-10-28 ENCOUNTER — Inpatient Hospital Stay: Payer: 59

## 2023-10-28 ENCOUNTER — Inpatient Hospital Stay: Payer: 59 | Admitting: Internal Medicine

## 2023-10-28 DIAGNOSIS — D509 Iron deficiency anemia, unspecified: Secondary | ICD-10-CM

## 2023-10-28 NOTE — Progress Notes (Signed)
 Patient Care Team: Hanford Powell BRAVO, NP as PCP - General (Family Medicine) Jeffrie Oneil BROCKS, MD as PCP - Cardiology (Cardiology) Gaynel Delon CROME, DPM as Consulting Physician (Podiatry)  Clinic Day:  10/29/2023  Referring physician: Leontine Cramp, NP  ASSESSMENT & PLAN:   Assessment & Plan: Microcytic anemia ron Deficiency Anemia   Chronic iron  deficiency anemia with a recent hemoglobin drop from 11.8 g/dL in June 2024 to 89.1 g/dL currently. She is on oral iron  supplementation (Integra Plus , one capsule daily) and has previously received iron  infusions (Venofer ). No current symptoms of weakness, dizziness, or bleeding.  She has had moderate fatigue and appetite decrease since June 2025.  She was hospitalized in June for severe anemia, receiving iron  infusion and blood transfusion.  Today, her CBC shows normal Hgb and HCT with stable and low MCV.  Reports constipation as a side effect of the iron  supplement. Awaiting iron  study results to determine the necessity of further iron  infusion. Discussed that hemoglobin is low but not critically low, and the option to continue oral iron  supplementation versus iron  infusion. She prefers to avoid infusion unless absolutely necessary.   - Continue oral iron  supplementation (Integra Plus , one capsule daily)   - Await results of iron  studies   - Consider iron  infusion if iron  studies indicate significant deficiency   - Follow up in six months.   She was advised to call immediately if she has any other concerning symptoms in the interval.    Gastric and small bowel AVMs Patient was admitted to hospital in June 2025 due to severe anemia.  She received blood transfusion and iron  infusion while there.  She underwent small bowel endoscopy.  There are multiple nonbleeding gastric and small bowel AVMs.  Most of the AVMs were ablated during procedure.  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  She does not  have a GI provider.  I recommended her to follow-up with her primary care provider regarding hospitalization and to get referral to GI provider.  She agreed with this plan.  Abnormal weight loss Patient has had an 11 pound weight loss since June 2025.  She reports decreased appetite starting just before her hospitalization.  She denies abdominal pain, nausea, vomiting, or diarrhea.  She does report intermittent constipation.  Again, I recommended she follow-up with her primary care to discuss abnormal weight loss and recent hospitalization.  She did agree with this plan.  Plan Reviewed CBC. - Normal Hgb at 12.0.  Normal HCT at 40.0.  MCV remains low at 67.1. - Awaiting results of ferritin and iron  studies.  Will recommend iron  infusions based on results of ferritin and iron  studies. Strongly encouraged patient to follow-up with her primary care provider due to recent hospitalization and weight loss of 11 pounds in past 3 to 4 weeks. Encouraged smoking cessation.  Patient reports smoking approximately 3 cigarettes/day. Plan to follow-up in 6 months with labs.  The patient understands the plans discussed today and is in agreement with them.  She knows to contact our office if she develops concerns prior to her next appointment.  I provided 25 minutes of face-to-face time during this encounter and > 50% was spent counseling as documented under my assessment and plan.    Powell BRAVO Hanford, NP  [facility] - A DEPT OF [facility] [address] [place] [address] Dept: [phone] Dept Fax: [phone]  No orders of the defined types were placed in this encounter.     CHIEF COMPLAINT:  CC: microcytic anemia  Current Treatment:   IV iron  as needed with Venofer  300 mg weekly for 3 weeks at a time Oral iron  supplement with Integra daily  INTERVAL HISTORY:  [name] Cooper is here today for repeat clinical assessment. She was  last seen by Dr, Sherrod on 04/29/2023.  Since her last visit in the cancer center, she was hospitalized for severe anemia.  She reports blood transfusion and iron  infusions while hospitalized.  She also underwent endoscopy of the small bowel.  She had multiple nonbleeding gastric and small bowel AVMs.  Most of the AVMs were ablated during procedure.  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  The patient also has had significant fatigue and decreased appetite since June 2025.  She is unsure of the reason why he just does not feel much like eating.  She denies chest pain, chest pressure, or shortness of breath. She denies headaches or visual disturbances. She denies abdominal pain, nausea, vomiting, or changes in bowel or bladder habits.  She does have intermittent constipation.  Does not take stool softener or laxative to improve symptoms.  She denies fevers or chills. She denies pain. Her weight has decreased 11 pounds over last 3 to 4 weeks.  I have reviewed the past medical history, past surgical history, social history and family history with the patient and they are unchanged from previous note.  ALLERGIES:  is allergic to lisinopril and codeine.  MEDICATIONS:  Current Outpatient Medications  Medication Sig Dispense Refill   acetaminophen  (TYLENOL ) 650 MG CR tablet Take 1,300 mg by mouth every 8 (eight) hours as needed for pain.     albuterol  (VENTOLIN  HFA) 108 (90 Base) MCG/ACT inhaler Inhale 2 puffs into the lungs every 6 (six) hours as needed for wheezing or shortness of breath. 1 each 3   Ascorbic Acid  (VITAMIN C) 500 MG CAPS Take 500 mg by mouth daily.     BREO ELLIPTA  100-25 MCG/ACT AEPB Inhale 1 puff into the lungs daily.     cholecalciferol (VITAMIN D3) 25 MCG (1000 UNIT) tablet Take 1,000 Units by mouth daily.     ezetimibe  (ZETIA ) 10 MG tablet Take 1 tablet (10 mg total) by mouth daily. 90 tablet 3   ferrous sulfate 325 (65 FE) MG EC  tablet Take 325 mg by mouth daily at 12 noon.     furosemide  (LASIX ) 40 MG tablet Take 1 tablet (40 mg total) by mouth daily. 30 tablet 0   gabapentin  (NEURONTIN ) 300 MG capsule Take 300 mg by mouth at bedtime.     hydrALAZINE  (APRESOLINE ) 50 MG tablet Take 50 mg by mouth 2 (two) times daily.     isosorbide  mononitrate (IMDUR ) 30 MG 24 hr tablet TAKE 1 TABLET BY MOUTH ONCE DAILY . APPOINTMENT REQUIRED FOR FUTURE REFILLS PLEASE  CALL  [phone] 15 tablet 0   nitroGLYCERIN  (NITROSTAT ) 0.4 MG SL tablet Place 1 tablet (0.4 mg total) under the tongue every 5 (five) minutes as needed for chest pain. 25 tablet prn   pantoprazole  (PROTONIX ) 40 MG tablet Take 1 tablet (40 mg total) by mouth 2 (two) times daily. 28 tablet 0   potassium chloride  (MICRO-K ) 10 MEQ CR capsule Take 10 mEq by mouth every morning.     rosuvastatin  (CRESTOR ) 20 MG tablet Take 1 tablet by mouth once daily 21 tablet 1   Semaglutide 14  MG TABS Take 1 tablet by mouth every morning.     No current facility-administered medications for this visit.        REVIEW OF SYSTEMS:   Constitutional: Denies fevers or chills.  She has fatigue.  Admits decreased appetite and unintentional weight loss. Eyes: Denies blurriness of vision Ears, nose, mouth, throat, and face: Denies mucositis or sore throat Respiratory: Denies cough, dyspnea or wheezes Cardiovascular: Denies palpitation, chest discomfort or lower extremity swelling Gastrointestinal:  Denies nausea, heartburn or change in bowel habits.  Has intermittent constipation. Skin: Denies abnormal skin rashes Lymphatics: Denies new lymphadenopathy or easy bruising Neurological:Denies numbness, tingling or new weaknesses Behavioral/Psych: Mood is stable, no new changes  All other systems were reviewed with the patient and are negative.   VITALS:   Today's Vitals   10/29/23 0914 10/29/23 0918  BP: 138/88   Pulse: 73   Resp: 18   Temp: 97.8 F (36.6 C)   SpO2: 95%   Weight:  280 lb 11.2 oz (127.3 kg)   PainSc:  0-No pain   Body mass index is 45.31 kg/m.   Wt Readings from Last 3 Encounters:  10/29/23 280 lb 11.2 oz (127.3 kg)  10/11/23 287 lb (130.2 kg)  10/04/23 291 lb 0.1 oz (132 kg)    Body mass index is 45.31 kg/m.  Performance status (ECOG): 1 - Symptomatic but completely ambulatory  PHYSICAL EXAM:   GENERAL:alert, no distress and comfortable SKIN: skin color, texture, turgor are normal, no rashes or significant lesions EYES: normal, Conjunctiva are pink and non-injected, sclera clear OROPHARYNX:no exudate, no erythema and lips, buccal mucosa, and tongue normal  NECK: supple, thyroid  normal size, non-tender, without nodularity LYMPH:  no palpable lymphadenopathy in the cervical, axillary or inguinal LUNGS: clear to auscultation and percussion with normal breathing effort HEART: regular rate & rhythm and no murmurs and no lower extremity edema ABDOMEN:abdomen soft, non-tender and normal bowel sounds Musculoskeletal:no cyanosis of digits and no clubbing  NEURO: alert & oriented x 3 with fluent speech, no focal motor/sensory deficits  LABORATORY DATA:  I have reviewed the data as listed    Component Value Date/Time   NA 139 10/03/2023 0533   NA 140 01/30/2019 1330   K 3.6 10/03/2023 0533   CL 104 10/03/2023 0533   CO2 26 10/03/2023 0533   GLUCOSE 104 (H) 10/03/2023 0533   BUN 10 10/03/2023 0533   BUN 18 01/30/2019 1330   CREATININE 1.25 (H) 10/03/2023 0533   CREATININE 1.20 (H) 12/21/2019 1336   CREATININE 1.28 (H) 09/21/2016 1115   CALCIUM  8.5 (L) 10/03/2023 0533   PROT 7.4 07/08/2020 1422   PROT 7.3 05/15/2019 1200   ALBUMIN 4.2 07/08/2020 1422   ALBUMIN 4.3 05/15/2019 1200   AST 13 07/08/2020 1422   AST 12 (L) 12/21/2019 1336   ALT 10 06/05/2021 0748   ALT 8 12/21/2019 1336   ALKPHOS 88 07/08/2020 1422   BILITOT 0.4 07/08/2020 1422   BILITOT 0.4 12/21/2019 1336   GFRNONAA 46 (L) 10/03/2023 0533   GFRNONAA 46 (L) 12/21/2019  1336   GFRNONAA 44 (L) 09/21/2016 1115   GFRAA 53 (L) 12/21/2019 1336   GFRAA 50 (L) 09/21/2016 1115    Lab Results  Component Value Date   SPEP Comment 07/31/2019    Lab Results  Component Value Date   WBC 9.1 10/29/2023   NEUTROABS 4.5 10/29/2023   HGB 12.0 10/29/2023   HCT 40.0 10/29/2023   MCV 67.1 (L) 10/29/2023  PLT 240 10/29/2023   RADIOGRAPHIC STUDIES: ECHOCARDIOGRAM COMPLETE Result Date: 10/01/2023    ECHOCARDIOGRAM REPORT   Patient Name:   [name] Cooper Date of Exam: 10/01/2023 Medical Rec #:  [id number]       Height:       66.0 in Accession #:    [id number]      Weight:       299.0 lb Date of Birth:  01/10/1951       BSA:          2.373 m Patient Age:    72 years        BP:           124/57 mmHg Patient Gender: F               HR:           75 bpm. Exam Location:  Inpatient Procedure: 2D Echo, Color Doppler and Cardiac Doppler (Both Spectral and Color            Flow Doppler were utilized during procedure). Indications:    Dyspnea  History:        Patient has prior history of Echocardiogram examinations and                 Patient has no prior history of Echocardiogram examinations.  Sonographer:    Benard Stallion Referring Phys: [id number] LAYMON PARAS MCINTYRE IMPRESSIONS  1. Left ventricular ejection fraction, by estimation, is 65 to 70%. The left ventricle has normal function. The left ventricle has no regional wall motion abnormalities. There is mild left ventricular hypertrophy. Left ventricular diastolic parameters were normal.  2. Right ventricular systolic function is normal. The right ventricular size is normal. Tricuspid regurgitation signal is inadequate for assessing PA pressure.  3. The mitral valve is normal in structure. Trivial mitral valve regurgitation. No evidence of mitral stenosis.  4. The aortic valve is tricuspid. Aortic valve regurgitation is not visualized. Aortic valve sclerosis is present, with no evidence of aortic valve stenosis. FINDINGS  Left Ventricle:  Left ventricular ejection fraction, by estimation, is 65 to 70%. The left ventricle has normal function. The left ventricle has no regional wall motion abnormalities. The left ventricular internal cavity size was normal in size. There is  mild left ventricular hypertrophy. Left ventricular diastolic parameters were normal. Right Ventricle: The right ventricular size is normal. No increase in right ventricular wall thickness. Right ventricular systolic function is normal. Tricuspid regurgitation signal is inadequate for assessing PA pressure. Left Atrium: Left atrial size was normal in size. Right Atrium: Right atrial size was normal in size. Pericardium: There is no evidence of pericardial effusion. Mitral Valve: The mitral valve is normal in structure. Trivial mitral valve regurgitation. No evidence of mitral valve stenosis. Tricuspid Valve: The tricuspid valve is normal in structure. Tricuspid valve regurgitation is trivial. Aortic Valve: The aortic valve is tricuspid. Aortic valve regurgitation is not visualized. Aortic valve sclerosis is present, with no evidence of aortic valve stenosis. Aortic valve mean gradient measures 6.0 mmHg. Aortic valve peak gradient measures 11.4 mmHg. Aortic valve area, by VTI measures 3.17 cm. Pulmonic Valve: The pulmonic valve was grossly normal. Pulmonic valve regurgitation is trivial. Aorta: The aortic root and ascending aorta are structurally normal, with no evidence of dilitation. IAS/Shunts: The atrial septum is grossly normal.  LEFT VENTRICLE PLAX 2D LVIDd:         4.10 cm   Diastology LVIDs:         2.60  cm   LV e' medial:    8.62 cm/s LV PW:         1.10 cm   LV E/e' medial:  7.8 LV IVS:        1.00 cm   LV e' lateral:   11.15 cm/s LVOT diam:     2.00 cm   LV E/e' lateral: 6.0 LV SV:         105 LV SV Index:   44 LVOT Area:     3.14 cm  RIGHT VENTRICLE RV Basal diam:  4.10 cm RV Mid diam:    3.60 cm RV S prime:     20.70 cm/s TAPSE (M-mode): 2.1 cm LEFT ATRIUM              Index        RIGHT ATRIUM           Index LA diam:        4.00 cm 1.69 cm/m   RA Area:     16.90 cm LA Vol (A2C):   68.3 ml 28.79 ml/m  RA Volume:   43.20 ml  18.21 ml/m LA Vol (A4C):   33.0 ml 13.91 ml/m LA Biplane Vol: 49.4 ml 20.82 ml/m  AORTIC VALVE AV Area (Vmax):    3.20 cm AV Area (Vmean):   3.15 cm AV Area (VTI):     3.17 cm AV Vmax:           169.00 cm/s AV Vmean:          110.667 cm/s AV VTI:            0.331 m AV Peak Grad:      11.4 mmHg AV Mean Grad:      6.0 mmHg LVOT Vmax:         172.00 cm/s LVOT Vmean:        111.000 cm/s LVOT VTI:          0.334 m LVOT/AV VTI ratio: 1.01  AORTA Ao Root diam: 3.00 cm Ao Asc diam:  2.90 cm MITRAL VALVE MV Area (PHT): 2.91 cm     SHUNTS MV Decel Time: 261 msec     Systemic VTI:  0.33 m MV E velocity: 67.10 cm/s   Systemic Diam: 2.00 cm MV A velocity: 118.00 cm/s MV E/A ratio:  0.57 Lonni Nanas MD Electronically signed by Lonni Nanas MD Signature Date/Time: 10/01/2023/9:47:22 AM    Final    DG Chest 2 View Result Date: 09/30/2023 CLINICAL DATA:  SOB EXAM: CHEST - 2 VIEW COMPARISON:  09/27/2023 FINDINGS: Lungs are clear.  Borderline hyperinflation. Heart size and mediastinal contours are within normal limits. No effusion. Surgical clips right axilla. IMPRESSION: No acute cardiopulmonary disease. Electronically Signed   By: JONETTA Faes M.D.   On: 09/30/2023 11:31

## 2023-10-28 NOTE — Assessment & Plan Note (Addendum)
 ron Deficiency Anemia   Chronic iron  deficiency anemia with a recent hemoglobin drop from 11.8 g/dL in June 2024 to 89.1 g/dL currently. She is on oral iron  supplementation (Integra Plus , one capsule daily) and has previously received iron  infusions (Venofer ). No current symptoms of weakness, dizziness, or bleeding.  She has had moderate fatigue and appetite decrease since June 2025.  She was hospitalized in June for severe anemia, receiving iron  infusion and blood transfusion.  Today, her CBC shows normal Hgb and HCT with stable and low MCV.  Reports constipation as a side effect of the iron  supplement. Awaiting iron  study results to determine the necessity of further iron  infusion. Discussed that hemoglobin is low but not critically low, and the option to continue oral iron  supplementation versus iron  infusion. She prefers to avoid infusion unless absolutely necessary.   - Continue oral iron  supplementation (Integra Plus , one capsule daily)   - Await results of iron  studies   - Consider iron  infusion if iron  studies indicate significant deficiency   - Follow up in six months.   She was advised to call immediately if she has any other concerning symptoms in the interval.

## 2023-10-29 ENCOUNTER — Inpatient Hospital Stay: Attending: Nurse Practitioner

## 2023-10-29 ENCOUNTER — Inpatient Hospital Stay (HOSPITAL_BASED_OUTPATIENT_CLINIC_OR_DEPARTMENT_OTHER): Admitting: Nurse Practitioner

## 2023-10-29 ENCOUNTER — Encounter: Payer: Self-pay | Admitting: Nurse Practitioner

## 2023-10-29 VITALS — BP 138/88 | HR 73 | Temp 97.8°F | Resp 18 | Wt 280.7 lb

## 2023-10-29 DIAGNOSIS — D509 Iron deficiency anemia, unspecified: Secondary | ICD-10-CM | POA: Insufficient documentation

## 2023-10-29 DIAGNOSIS — K5520 Angiodysplasia of colon without hemorrhage: Secondary | ICD-10-CM | POA: Insufficient documentation

## 2023-10-29 DIAGNOSIS — R634 Abnormal weight loss: Secondary | ICD-10-CM | POA: Diagnosis not present

## 2023-10-29 LAB — CBC WITH DIFFERENTIAL (CANCER CENTER ONLY)
Abs Immature Granulocytes: 0.02 K/uL (ref 0.00–0.07)
Basophils Absolute: 0.1 K/uL (ref 0.0–0.1)
Basophils Relative: 1 %
Eosinophils Absolute: 0.4 K/uL (ref 0.0–0.5)
Eosinophils Relative: 5 %
HCT: 40 % (ref 36.0–46.0)
Hemoglobin: 12 g/dL (ref 12.0–15.0)
Immature Granulocytes: 0 %
Lymphocytes Relative: 34 %
Lymphs Abs: 3.1 K/uL (ref 0.7–4.0)
MCH: 20.1 pg — ABNORMAL LOW (ref 26.0–34.0)
MCHC: 30 g/dL (ref 30.0–36.0)
MCV: 67.1 fL — ABNORMAL LOW (ref 80.0–100.0)
Monocytes Absolute: 1 K/uL (ref 0.1–1.0)
Monocytes Relative: 11 %
Neutro Abs: 4.5 K/uL (ref 1.7–7.7)
Neutrophils Relative %: 49 %
Platelet Count: 240 K/uL (ref 150–400)
RBC: 5.96 MIL/uL — ABNORMAL HIGH (ref 3.87–5.11)
RDW: 28 % — ABNORMAL HIGH (ref 11.5–15.5)
WBC Count: 9.1 K/uL (ref 4.0–10.5)
nRBC: 0 % (ref 0.0–0.2)

## 2023-10-29 LAB — IRON AND IRON BINDING CAPACITY (CC-WL,HP ONLY)
Iron: 46 ug/dL (ref 28–170)
Saturation Ratios: 14 % (ref 10.4–31.8)
TIBC: 323 ug/dL (ref 250–450)
UIBC: 277 ug/dL (ref 148–442)

## 2023-10-29 LAB — FERRITIN: Ferritin: 52 ng/mL (ref 11–307)

## 2023-11-01 ENCOUNTER — Other Ambulatory Visit: Payer: Self-pay | Admitting: Registered Nurse

## 2023-11-01 ENCOUNTER — Telehealth: Payer: Self-pay | Admitting: Internal Medicine

## 2023-11-01 DIAGNOSIS — Z0000 Encounter for general adult medical examination without abnormal findings: Secondary | ICD-10-CM

## 2023-11-01 NOTE — Telephone Encounter (Signed)
 Scheduled appointments per 7/11 los. Talked with the patient and she is aware of the made appointments.

## 2023-11-02 ENCOUNTER — Ambulatory Visit: Payer: Self-pay | Admitting: Nurse Practitioner

## 2023-11-10 ENCOUNTER — Ambulatory Visit: Admitting: Cardiology

## 2023-12-01 ENCOUNTER — Other Ambulatory Visit: Payer: Self-pay | Admitting: Registered Nurse

## 2023-12-01 ENCOUNTER — Ambulatory Visit
Admission: RE | Admit: 2023-12-01 | Discharge: 2023-12-01 | Disposition: A | Discharge status: Home / Self Care | Source: Ambulatory Visit | Attending: Registered Nurse | Admitting: Registered Nurse

## 2023-12-01 DIAGNOSIS — Z0000 Encounter for general adult medical examination without abnormal findings: Secondary | ICD-10-CM

## 2024-01-04 ENCOUNTER — Encounter: Payer: Self-pay | Admitting: Podiatry

## 2024-01-04 ENCOUNTER — Ambulatory Visit (INDEPENDENT_AMBULATORY_CARE_PROVIDER_SITE_OTHER): Admitting: Podiatry

## 2024-01-04 DIAGNOSIS — B351 Tinea unguium: Secondary | ICD-10-CM

## 2024-01-04 DIAGNOSIS — M79675 Pain in left toe(s): Secondary | ICD-10-CM

## 2024-01-04 DIAGNOSIS — E119 Type 2 diabetes mellitus without complications: Secondary | ICD-10-CM

## 2024-01-04 DIAGNOSIS — M79674 Pain in right toe(s): Secondary | ICD-10-CM | POA: Diagnosis not present

## 2024-01-09 NOTE — Progress Notes (Signed)
  Subjective:  Patient ID: [name] Cooper, female    DOB: 1950-09-04,  MRN: [id number]  73 y.o. female presents preventative diabetic foot care for painful thick toenails that are difficult to trim. Pain interferes with ambulation. Aggravating factors include wearing enclosed shoe gear. Pain is relieved with periodic professional debridement.  Chief Complaint  Patient presents with   DFc    Rm16 Diabeytic foot care/ NP Powell Lessen last visit July 2025/ A1c 10    New problem(s): None   PCP is Lessen Powell BRAVO, NP.  Allergies  Allergen Reactions   Lisinopril Anaphylaxis   Codeine     REACTION: MAKES HER SLEEPY    Review of Systems: Negative except as noted in the HPI.   Objective:  [name] Cooper is a pleasant 73 y.o. female morbidly obese in NAD. AAO x 3.  Vascular Examination: Vascular status intact b/l with palpable pedal pulses. CFT immediate b/l. Pedal hair present. No edema. No pain with calf compression b/l. Skin temperature gradient WNL b/l. No varicosities noted. No cyanosis or clubbing noted.  Neurological Examination: Sensation grossly intact b/l with 10 gram monofilament. Vibratory sensation intact b/l.  Dermatological Examination: Pedal skin with normal turgor, texture and tone b/l. No open wounds nor interdigital macerations noted. Toenails 1-5 b/l thick, discolored, elongated with subungual debris and pain on dorsal palpation. No hyperkeratotic lesions noted b/l.   Musculoskeletal Examination: Muscle strength 5/5 to b/l LE.  No pain, crepitus noted b/l. No gross pedal deformities. Patient ambulates independently without assistive aids.   Radiographs: None  Last A1c:      Latest Ref Rng & Units 10/01/2023   10:51 AM  Hemoglobin A1C  Hemoglobin-A1c 4.8 - 5.6 % 6.3     Assessment:   1. Pain due to onychomycosis of toenails of both feet   2. Diabetes mellitus without complication (HCC)    Plan:  Consent given for treatment. Patient examined. All  patient's and/or POA's questions/concerns addressed on today's visit. Mycotic toenails 1-5 debrided in length and girth without incident. Continue foot and shoe inspections daily. Monitor blood glucose per PCP/Endocrinologist's recommendations.Continue soft, supportive shoe gear daily. Report any pedal injuries to medical professional. Call office if there are any quesitons/concerns. -Patient/POA to call should there be question/concern in the interim.  Return in about 3 months (around 04/04/2024).  Delon LITTIE Merlin, DPM       LOCATION: [address], [place] [address]                   Office [phone]   [facility] LOCATION: [address] [place], [place] [address] Office [phone]

## 2024-03-20 DIAGNOSIS — M25511 Pain in right shoulder: Secondary | ICD-10-CM | POA: Insufficient documentation

## 2024-04-05 ENCOUNTER — Encounter: Payer: Self-pay | Admitting: Cardiology

## 2024-04-05 ENCOUNTER — Other Ambulatory Visit (HOSPITAL_COMMUNITY): Payer: Self-pay

## 2024-04-05 ENCOUNTER — Ambulatory Visit: Attending: Cardiovascular Disease | Admitting: Cardiology

## 2024-04-05 VITALS — BP 125/71 | HR 55 | Ht 66.0 in | Wt 293.0 lb

## 2024-04-05 DIAGNOSIS — K5520 Angiodysplasia of colon without hemorrhage: Secondary | ICD-10-CM | POA: Diagnosis not present

## 2024-04-05 DIAGNOSIS — I2510 Atherosclerotic heart disease of native coronary artery without angina pectoris: Secondary | ICD-10-CM | POA: Diagnosis not present

## 2024-04-05 DIAGNOSIS — F17210 Nicotine dependence, cigarettes, uncomplicated: Secondary | ICD-10-CM | POA: Diagnosis not present

## 2024-04-05 MED ORDER — NICOTINE POLACRILEX 2 MG MT GUM
2.0000 mg | CHEWING_GUM | OROMUCOSAL | 1 refills | Status: AC | PRN
  Filled 2024-04-05: qty 110, 15d supply, fill #0

## 2024-04-05 NOTE — Patient Instructions (Signed)
 Medication Instructions:  Please START using Nicotine  gum.  *If you need a refill on your cardiac medications before your next appointment, please call your pharmacy*  Lab Work: None.  If you have labs (blood work) drawn today and your tests are completely normal, you will receive your results only by: MyChart Message (if you have MyChart) OR A paper copy in the mail If you have any lab test that is abnormal or we need to change your treatment, we will call you to review the results.  Testing/Procedures: None.  Follow-Up: At [facility], you and your health needs are our priority.  As part of our continuing mission to provide you with exceptional heart care, our providers are all part of one team.  This team includes your primary Cardiologist (physician) and Advanced Practice Providers or APPs (Physician Assistants and Nurse Practitioners) who all work together to provide you with the care you need, when you need it.  Your next appointment:   3 month(s)  Provider:   Dr. CHRISTELLA. Patwardhan, MD   We recommend signing up for the patient portal called MyChart.  Sign up information is provided on this After Visit Summary.  MyChart is used to connect with patients for Virtual Visits (Telemedicine).  Patients are able to view lab/test results, encounter notes, upcoming appointments, etc.  Non-urgent messages can be sent to your provider as well.   To learn more about what you can do with MyChart, go to [email or web address].   Other Instructions Dr. Elmira has referred you to Dr. Rollin (GI MD). Someone from their office should call you to offer an appointment within 2 weeks.

## 2024-04-05 NOTE — Progress Notes (Signed)
 Cardiology Office Note:  .   Date:  04/05/2024  ID:  [name] Cooper, DOB 1950/07/19, MRN [id number] PCP: Hanford Powell BRAVO, NP  [facility] Providers Cardiologist:  Newman Lawrence, MD PCP: Hanford Powell BRAVO, NP  Chief Complaint  Patient presents with   Coronary Artery Disease     [name] Cooper is a 73 y.o. female with hypertension, hyperlipidemia, COPD, CAD, symptomatic anemia, small intestine AVM  History of Present Illness  Patient is here for follow-up.  In 2020, she had cardiac apposition that showed proximal mid LAD 80% calcific stenosis without any symptoms.  At that time, medical management was recommended.  She has not had any chest pain symptoms at any point.  She has mild exertional dyspnea, attributed to her COPD.  She was hospitalized in June 2025 with symptomatic anemia with hemoglobin down to 7.5, attributed to AVMs of small intestine, based on endoscopy performed Dr. Belvie Dais.  She has not seen gastroenterology since then.  Hemoglobin has significantly improved since then to 12.   Vitals:   04/05/24 1357  BP: 125/71  Pulse: (!) 55  SpO2: 96%       Review of Systems  Cardiovascular:  Positive for dyspnea on exertion. Negative for chest pain, leg swelling, palpitations and syncope.        Studies Reviewed: SABRA        Labs 09/2023: HbA1C 6.3% Hb 9.6 Cr 1.25, eGFR 46  2023: Chol 110, TG 73, HDL 56, LDL 39  Echocardiogram 09/2023:  1. Left ventricular ejection fraction, by estimation, is 65 to 70%. The  left ventricle has normal function. The left ventricle has no regional  wall motion abnormalities. There is mild left ventricular hypertrophy.  Left ventricular diastolic parameters  were normal.   2. Right ventricular systolic function is normal. The right ventricular  size is normal. Tricuspid regurgitation signal is inadequate for assessing  PA pressure.   3. The mitral valve is normal in structure. Trivial mitral valve   regurgitation. No evidence of mitral stenosis.   4. The aortic valve is tricuspid. Aortic valve regurgitation is not  visualized. Aortic valve sclerosis is present, with no evidence of aortic  valve stenosis.   Coronary angiogram 2020: Significant single vessel coronary artery disease with moderately to severely calcified 80% mid LAD stenosis. Mild, non-obstructive coronary artery disease involving the LCx and RCA. Normal left ventricular systolic function and filling pressure.    Physical Exam Vitals and nursing note reviewed.  Constitutional:      General: She is not in acute distress. Neck:     Vascular: No JVD.  Cardiovascular:     Rate and Rhythm: Normal rate and regular rhythm.     Heart sounds: Normal heart sounds. No murmur heard. Pulmonary:     Effort: Pulmonary effort is normal.     Breath sounds: Normal breath sounds. No wheezing or rales.  Musculoskeletal:     Right lower leg: Edema (Trace) present.     Left lower leg: Edema (Trace) present.      VISIT DIAGNOSES:   ICD-10-CM   1. Coronary artery disease involving native coronary artery of native heart without angina pectoris  I25.10     2. AVM (arteriovenous malformation) of small bowel, acquired  K55.20 Ambulatory referral to Gastroenterology    3. Nicotine  dependence, cigarettes, uncomplicated  F17.210         [name] Cooper is a 73 y.o. female with hypertension, hyperlipidemia, COPD, CAD, symptomatic anemia Assessment & Plan  CAD: 80% proximal/mid LAD stenosis on prior catheterization 2020, which was medically treated in absence of any anginal symptoms..  No angina symptoms at this time.  Given her recent GI bleeding, she is not on any antiplatelet therapy at this time.  Recent echocardiogram was reassuring.  Continue lipid-lowering therapy with statin and Zetia .  I would ideally like her to be back on aspirin  81 mg daily, if okay by GI.  She has not seen Dr. Rollin since endoscopy performed in the  hospital in 09/2023.  I will refer her to see Dr. Rollin to opine on that.  Hypertension: Controlled  Mixed hyperlipidemia: Controlled  Symptomatic anemia: Defer management to PCP, does need follow-up with gastroenterology.  Nicotine  dependence: Tobacco cessation counseling:  - Currently smoking 1/4 packs/day   - Patient was informed of the dangers of tobacco abuse including stroke, cancer, and MI, as well as benefits of tobacco cessation. - Patient is willing to quit at this time. - Approximately 5 mins were spent counseling patient cessation techniques. We discussed various methods to help quit smoking, including deciding on a date to quit, joining a support group, pharmacological agents. Patient would like to use nicotine  gum. - I will reassess her progress at the next follow-up visit  Leg edema: Trace, reassuring echocardiogram.  Okay to continue Lasix .    F/u in 3 months  Signed, Newman JINNY Lawrence, MD

## 2024-04-11 ENCOUNTER — Ambulatory Visit: Admitting: Podiatry

## 2024-04-11 ENCOUNTER — Encounter: Payer: Self-pay | Admitting: Podiatry

## 2024-04-11 DIAGNOSIS — E119 Type 2 diabetes mellitus without complications: Secondary | ICD-10-CM | POA: Diagnosis not present

## 2024-04-11 DIAGNOSIS — M79675 Pain in left toe(s): Secondary | ICD-10-CM

## 2024-04-11 DIAGNOSIS — B351 Tinea unguium: Secondary | ICD-10-CM | POA: Diagnosis not present

## 2024-04-11 DIAGNOSIS — M79674 Pain in right toe(s): Secondary | ICD-10-CM

## 2024-04-11 NOTE — Progress Notes (Signed)
"  °  Subjective:  Patient ID: [name] Cooper, female    DOB: 07-12-1950,  MRN: [id number]  [name] Cooper presents to clinic today for preventative diabetic foot care for painful mycotic toenails of both feet that are difficult to trim. Pain interferes with daily activities and wearing enclosed shoe gear comfortably.  Chief Complaint  Patient presents with   Diabetes    DFC NIDDM A1C 5.7. Toenail trim.LOV with PCP 10/29/23.   New problem(s): None.   PCP is Hanford Powell BRAVO, NP.  Allergies[1]  Review of Systems: Negative except as noted in the HPI.  Objective: No changes noted in today's physical examination. There were no vitals filed for this visit. [name] Cooper is a pleasant 73 y.o. female in NAD. AAO x 3.  Vascular Examination: Vascular status intact b/l with palpable pedal pulses. CFT immediate b/l. Pedal hair present. No edema. No pain with calf compression b/l. Skin temperature gradient WNL b/l. No varicosities noted. No cyanosis or clubbing noted.  Neurological Examination: Sensation grossly intact b/l with 10 gram monofilament. Vibratory sensation intact b/l.  Dermatological Examination: Pedal skin with normal turgor, texture and tone b/l. No open wounds nor interdigital macerations noted. Toenails 1-5 b/l thick, discolored, elongated with subungual debris and pain on dorsal palpation. No hyperkeratotic lesions noted b/l.   Musculoskeletal Examination: Muscle strength 5/5 to b/l LE.  No pain, crepitus noted b/l. No gross pedal deformities. Patient ambulates independently without assistive aids.   Radiographs: None  Assessment/Plan: 1. Pain due to onychomycosis of toenails of both feet   2. Diabetes mellitus without complication [facility])   Patient was evaluated and treated. All patient's and/or POA's questions/concerns addressed on today's visit. Treatment was provided by assistant Andrez Manchester under my supervision. Toenails 1-5 b/l debrided in length and girth  without incident. Continue foot and shoe inspections daily. Monitor blood glucose per PCP/Endocrinologist's recommendations. Continue soft, supportive shoe gear daily. Report any pedal injuries to medical professional. Call office if there are any questions/concerns. -Patient/POA to call should there be question/concern in the interim.   Return in about 3 months (around 07/10/2024).  Delon LITTIE Merlin, DPM      [facility] LOCATION: [address], [place] [address]                   Office [phone]   [facility] LOCATION: [address] [place], [place] [address] Office [phone]     [1]  Allergies Allergen Reactions   Lisinopril Anaphylaxis   Codeine     REACTION: MAKES HER SLEEPY   "

## 2024-04-17 ENCOUNTER — Other Ambulatory Visit (HOSPITAL_COMMUNITY): Payer: Self-pay

## 2024-04-27 ENCOUNTER — Other Ambulatory Visit: Payer: Self-pay | Admitting: Registered Nurse

## 2024-04-27 ENCOUNTER — Ambulatory Visit
Admission: RE | Admit: 2024-04-27 | Discharge: 2024-04-27 | Disposition: A | Discharge status: Home / Self Care | Source: Ambulatory Visit | Attending: Registered Nurse | Admitting: Registered Nurse

## 2024-04-27 DIAGNOSIS — I1310 Hypertensive heart and chronic kidney disease without heart failure, with stage 1 through stage 4 chronic kidney disease, or unspecified chronic kidney disease: Secondary | ICD-10-CM

## 2024-05-01 ENCOUNTER — Other Ambulatory Visit: Payer: Self-pay | Admitting: Medical Oncology

## 2024-05-01 DIAGNOSIS — D508 Other iron deficiency anemias: Secondary | ICD-10-CM

## 2024-05-02 ENCOUNTER — Inpatient Hospital Stay: Admitting: Internal Medicine

## 2024-05-02 ENCOUNTER — Inpatient Hospital Stay

## 2024-05-19 ENCOUNTER — Other Ambulatory Visit: Payer: Self-pay

## 2024-05-23 ENCOUNTER — Inpatient Hospital Stay: Admitting: Internal Medicine

## 2024-05-23 ENCOUNTER — Inpatient Hospital Stay

## 2024-05-23 ENCOUNTER — Encounter: Payer: Self-pay | Admitting: Internal Medicine

## 2024-05-23 VITALS — BP 144/78 | HR 66 | Temp 97.7°F | Resp 17 | Ht 66.0 in | Wt 292.6 lb

## 2024-05-23 DIAGNOSIS — D509 Iron deficiency anemia, unspecified: Secondary | ICD-10-CM

## 2024-05-23 DIAGNOSIS — D508 Other iron deficiency anemias: Secondary | ICD-10-CM

## 2024-05-23 LAB — CMP (CANCER CENTER ONLY)
ALT: 6 U/L (ref 0–44)
AST: 21 U/L (ref 15–41)
Albumin: 3.9 g/dL (ref 3.5–5.0)
Alkaline Phosphatase: 97 U/L (ref 38–126)
Anion gap: 10 (ref 5–15)
BUN: 13 mg/dL (ref 8–23)
CO2: 29 mmol/L (ref 22–32)
Calcium: 9.2 mg/dL (ref 8.9–10.3)
Chloride: 105 mmol/L (ref 98–111)
Creatinine: 1.1 mg/dL — ABNORMAL HIGH (ref 0.44–1.00)
GFR, Estimated: 52 mL/min — ABNORMAL LOW
Glucose, Bld: 96 mg/dL (ref 70–99)
Potassium: 3.5 mmol/L (ref 3.5–5.1)
Sodium: 144 mmol/L (ref 135–145)
Total Bilirubin: 0.8 mg/dL (ref 0.0–1.2)
Total Protein: 7.3 g/dL (ref 6.5–8.1)

## 2024-05-23 LAB — CBC WITH DIFFERENTIAL (CANCER CENTER ONLY)
Abs Immature Granulocytes: 0.03 10*3/uL (ref 0.00–0.07)
Basophils Absolute: 0.1 10*3/uL (ref 0.0–0.1)
Basophils Relative: 1 %
Eosinophils Absolute: 0.3 10*3/uL (ref 0.0–0.5)
Eosinophils Relative: 4 %
HCT: 34.9 % — ABNORMAL LOW (ref 36.0–46.0)
Hemoglobin: 9.4 g/dL — ABNORMAL LOW (ref 12.0–15.0)
Immature Granulocytes: 0 %
Lymphocytes Relative: 21 %
Lymphs Abs: 1.7 10*3/uL (ref 0.7–4.0)
MCH: 16.9 pg — ABNORMAL LOW (ref 26.0–34.0)
MCHC: 26.9 g/dL — ABNORMAL LOW (ref 30.0–36.0)
MCV: 62.9 fL — ABNORMAL LOW (ref 80.0–100.0)
Monocytes Absolute: 0.9 10*3/uL (ref 0.1–1.0)
Monocytes Relative: 11 %
Neutro Abs: 5.1 10*3/uL (ref 1.7–7.7)
Neutrophils Relative %: 63 %
Platelet Count: 251 10*3/uL (ref 150–400)
RBC: 5.55 MIL/uL — ABNORMAL HIGH (ref 3.87–5.11)
RDW: 26.7 % — ABNORMAL HIGH (ref 11.5–15.5)
WBC Count: 8.1 10*3/uL (ref 4.0–10.5)
nRBC: 0 % (ref 0.0–0.2)

## 2024-05-23 LAB — IRON AND IRON BINDING CAPACITY (CC-WL,HP ONLY)
Iron: 17 ug/dL — ABNORMAL LOW (ref 28–170)
Saturation Ratios: 5 % — ABNORMAL LOW (ref 10.4–31.8)
TIBC: 372 ug/dL (ref 250–450)
UIBC: 356 ug/dL

## 2024-05-23 LAB — FERRITIN: Ferritin: 28 ng/mL (ref 11–307)

## 2024-05-23 NOTE — Progress Notes (Signed)
 "     [facility] [phone]   Fax:([phone]  OFFICE PROGRESS NOTE  Hanford Powell BRAVO, NP [address] [place] [address]  DIAGNOSIS: Iron  deficiency anemia Bone marrow biopsy and aspirate in April 2023 was nonspecific but could not exclude early MDS.   PRIOR THERAPY: None   CURRENT THERAPY:  1) IV iron  PRN with venofer  300 mg weekly for 3 weeks at a time.   2) Oral iron  supplement with integra plus  p.o. daily.    INTERVAL HISTORY: [name] Cooper 74 y.o. female returns to the clinic today for follow-up visit accompanied by her brother. Discussed the use of AI scribe software for clinical note transcription with the patient, who gave verbal consent to proceed.  History of Present Illness [name] Cooper is a 74 year old female with chronic iron  deficiency anemia who presents for evaluation of recurrent anemia.  She is maintained on oral iron  supplementation. Her laboratory results show a decline in hemoglobin from 12 g/dL in July to 9.4 g/dL at this visit, with associated decreases in MCV (62.9), serum iron  (17), and iron  saturation (5). She previously received intravenous Venofer  without complications.  She reports feeling well overall, with no complaints of bleeding, fatigue, or weakness since her last visit six months ago. She notes a cough, which she attributes to a recent upper respiratory infection, and denies other new symptoms.      MEDICAL HISTORY: Past Medical History:  Diagnosis Date   Anemia    Arthritis    Cancer (HCC)    breast and ovarian   Chicken pox    Chronic lower back pain    COPD (chronic obstructive pulmonary disease) (HCC)    Diabetes (HCC)    GERD (gastroesophageal reflux disease)    Hepatitis 1970's   History of recurrent UTIs    Hyperlipemia    Hypertension    Slow heart rate dx'd 09/24/2014   Urine incontinence     ALLERGIES:  is allergic to lisinopril and codeine.  MEDICATIONS:  Current  Outpatient Medications  Medication Sig Dispense Refill   acetaminophen  (TYLENOL ) 650 MG CR tablet Take 1,300 mg by mouth every 8 (eight) hours as needed for pain.     albuterol  (VENTOLIN  HFA) 108 (90 Base) MCG/ACT inhaler Inhale 2 puffs into the lungs every 6 (six) hours as needed for wheezing or shortness of breath. 1 each 3   Ascorbic Acid  (VITAMIN C) 500 MG CAPS Take 500 mg by mouth daily.     BREO ELLIPTA  100-25 MCG/ACT AEPB Inhale 1 puff into the lungs daily.     cholecalciferol (VITAMIN D3) 25 MCG (1000 UNIT) tablet Take 1,000 Units by mouth daily.     ezetimibe  (ZETIA ) 10 MG tablet Take 1 tablet (10 mg total) by mouth daily. 90 tablet 3   ferrous sulfate 325 (65 FE) MG EC tablet Take 325 mg by mouth daily at 12 noon.     furosemide  (LASIX ) 20 MG tablet Take 20 mg by mouth daily as needed.     gabapentin  (NEURONTIN ) 300 MG capsule Take 300 mg by mouth at bedtime.     hydrALAZINE  (APRESOLINE ) 50 MG tablet Take 50 mg by mouth 2 (two) times daily.     HYDROcodone -acetaminophen  (NORCO/VICODIN) 5-325 MG tablet Take 1 tablet by mouth 2 (two) times daily as needed.     isosorbide  mononitrate (IMDUR ) 30 MG 24 hr tablet TAKE 1 TABLET BY MOUTH ONCE DAILY . APPOINTMENT REQUIRED FOR FUTURE REFILLS PLEASE  CALL  [phone] 15 tablet 0   nicotine  polacrilex (NICORETTE ) 2 MG gum Take 1 each (2 mg total) by mouth as needed for smoking cessation. 110 tablet 1   nitroGLYCERIN  (NITROSTAT ) 0.4 MG SL tablet Place 1 tablet (0.4 mg total) under the tongue every 5 (five) minutes as needed for chest pain. 25 tablet prn   omeprazole (PRILOSEC) 40 MG capsule Take 40 mg by mouth at bedtime.     pantoprazole  (PROTONIX ) 40 MG tablet Take 1 tablet (40 mg total) by mouth 2 (two) times daily. (Patient not taking: Reported on 04/11/2024) 28 tablet 0   Potassium Chloride  ER 20 MEQ TBCR Take 1 tablet by mouth daily.     rosuvastatin  (CRESTOR ) 20 MG tablet Take 1 tablet by mouth once daily 21 tablet 1   Semaglutide 14 MG  TABS Take 1 tablet by mouth every morning.     No current facility-administered medications for this visit.    SURGICAL HISTORY:  Past Surgical History:  Procedure Laterality Date   ABDOMINAL HYSTERECTOMY  1984   APPENDECTOMY     BREAST BIOPSY Right 1996   BREAST LUMPECTOMY Right 1996   CYSTOSCOPY WITH URETEROSCOPY AND STENT PLACEMENT Left 09/18/2021   Procedure: CYSTOSCOPY WITH LEFT DIAGNOSTIC URETEROSCOPY, RETROGRADE PYELOGRAM, AND STENT PLACEMENT;  Surgeon: Cam Morene ORN, MD;  Location: [facility] ORS;  Service: Urology;  Laterality: Left;  75 MINS   ENTEROSCOPY N/A 10/02/2023   Procedure: ENTEROSCOPY;  Surgeon: Rollin Dover, MD;  Location: [facility] ENDOSCOPY;  Service: Gastroenterology;  Laterality: N/A;   HOT HEMOSTASIS N/A 10/02/2023   Procedure: EGD, WITH ARGON PLASMA COAGULATION;  Surgeon: Rollin Dover, MD;  Location: [facility] ENDOSCOPY;  Service: Gastroenterology;  Laterality: N/A;   LEFT HEART CATH AND CORONARY ANGIOGRAPHY N/A 01/13/2019   Procedure: LEFT HEART CATH AND CORONARY ANGIOGRAPHY;  Surgeon: Mady Bruckner, MD;  Location: [facility] INVASIVE CV LAB;  Service: Cardiovascular;  Laterality: N/A;   MASTECTOMY Right 1996   SCLEROTHERAPY  10/02/2023   Procedure: SCLEROTHERAPY;  Surgeon: Rollin Dover, MD;  Location: [facility] ENDOSCOPY;  Service: Gastroenterology;;   TONSILLECTOMY      REVIEW OF SYSTEMS:  A comprehensive review of systems was negative except for: Constitutional: positive for fatigue   PHYSICAL EXAMINATION: General appearance: alert, cooperative, and no distress Head: Normocephalic, without obvious abnormality, atraumatic Neck: no adenopathy, no JVD, supple, symmetrical, trachea midline, and thyroid  not enlarged, symmetric, no tenderness/mass/nodules Lymph nodes: Cervical, supraclavicular, and axillary nodes normal. Resp: clear to auscultation bilaterally Back: symmetric, no curvature. ROM normal. No CVA tenderness. Cardio: regular rate and rhythm, S1, S2 normal, no murmur, click,  rub or gallop GI: soft, non-tender; bowel sounds normal; no masses,  no organomegaly Extremities: extremities normal, atraumatic, no cyanosis or edema  ECOG PERFORMANCE STATUS: 1 - Symptomatic but completely ambulatory  Pulse (!) 57, temperature 97.7 F (36.5 C), resp. rate 17, height 5' 6 (1.676 m), weight 292 lb 9.6 oz (132.7 kg).  LABORATORY DATA: Lab Results  Component Value Date   WBC 8.1 05/23/2024   HGB 9.4 (L) 05/23/2024   HCT 34.9 (L) 05/23/2024   MCV 62.9 (L) 05/23/2024   PLT 251 05/23/2024      Chemistry      Component Value Date/Time   NA 144 05/23/2024 1407   NA 140 01/30/2019 1330   K 3.5 05/23/2024 1407   CL 105 05/23/2024 1407   CO2 29 05/23/2024 1407   BUN 13 05/23/2024 1407   BUN 18 01/30/2019 1330   CREATININE 1.10 (H)  05/23/2024 1407   CREATININE 1.28 (H) 09/21/2016 1115      Component Value Date/Time   CALCIUM  9.2 05/23/2024 1407   ALKPHOS 97 05/23/2024 1407   AST 21 05/23/2024 1407   ALT 6 05/23/2024 1407   BILITOT 0.8 05/23/2024 1407       RADIOGRAPHIC STUDIES: DG Chest 2 View Result Date: 04/30/2024 CLINICAL DATA:  Hypertensive heart and renal disease with renal failure. Patient reports shortness of breath when walking long distances. EXAM: CHEST - 2 VIEW COMPARISON:  09/30/2023, CT 12/16/2022 FINDINGS: The cardiomediastinal contours are unchanged. Mild aortic tortuosity, stable. Pulmonary vasculature is normal. No consolidation, pleural effusion, or pneumothorax. No acute osseous abnormalities are seen. Right mastectomy and surgical clips in the axilla. IMPRESSION: No active cardiopulmonary disease. Electronically Signed   By: Andrea Gasman M.D.   On: 04/30/2024 10:59    ASSESSMENT AND PLAN: This is a very pleasant 74 years old African-American female with iron  deficiency anemia status post Venofer  infusion in October 2021.   The patient is status post iron  infusion with Venofer  completed last year and tolerated it fairly well.  She has  been on oral iron  tablet with Integra +1 capsule p.o. daily. The patient was treated with iron  infusion with Venofer  several times recently even at a higher dose of 400 mg but no significant improvement in her anemia.   She had a recent bone marrow biopsy and aspirate that showed no specific findings unlikely to be reactive in nature but early MDS could not be excluded. She had extensive blood work in the past that was unremarkable to explain the underlying etiology of her microcytic anemia except for the possibility of gastrointestinal blood loss but she had colonoscopy and upper endoscopy in July 2021 with removal of few polyps.  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 The patient is currently on oral iron  tablet with Integra +1 capsule p.o. daily.  The patient has been tolerating this treatment well except for intermittent constipation. Repeat CBC today showed persistent anemia with significant iron  deficiency. Assessment and Plan Assessment & Plan Iron  deficiency anemia Chronic iron  deficiency anemia with laboratory evidence of worsening, including decreased hemoglobin, MCV, serum iron , and iron  saturation. Despite oral iron  supplementation, she continues to experience recurrent deficiency. She previously tolerated Venofer  infusions without complications. No symptoms of bleeding, fatigue, or weakness. - Ordered Venofer  infusion to address recurrent deficiency and improve anemia and energy levels. - Arranged for infusion center to contact her for scheduling. - Scheduled follow-up in 3 months to reassess response to therapy. She was advised to call immediately if she has any other concerning symptoms in the interval.  The patient voices understanding of current disease status and treatment options and is in agreement with the current care plan.  All questions were answered. The patient knows to call the clinic with any problems, questions or concerns. We can certainly see the  patient much sooner if necessary.  Disclaimer: This note was dictated with voice recognition software. Similar sounding words can inadvertently be transcribed and may not be corrected upon review.       "

## 2024-05-25 ENCOUNTER — Telehealth: Payer: Self-pay

## 2024-05-25 NOTE — Telephone Encounter (Signed)
 Dr. Sherrod, patient will be scheduled as soon as possible.  Auth Submission: NO AUTH NEEDED Site of care: Site of care: [facility] Payer: [employer] dual complete medicare with [place] [employer] Medication & CPT/J Code(s) submitted: Venofer  (Iron  Sucrose) J1756 Diagnosis Code:  Route of submission (phone, fax, portal):  Phone # Fax # Auth type: Buy/Bill PB Units/visits requested: 300mg  x 3 doses Reference number:  Approval from: 05/25/24 to 08/17/24

## 2024-06-02 ENCOUNTER — Ambulatory Visit

## 2024-06-09 ENCOUNTER — Ambulatory Visit

## 2024-07-06 ENCOUNTER — Ambulatory Visit: Admitting: Cardiology

## 2024-07-26 ENCOUNTER — Ambulatory Visit: Admitting: Podiatry

## 2024-08-22 ENCOUNTER — Inpatient Hospital Stay

## 2024-08-22 ENCOUNTER — Inpatient Hospital Stay: Admitting: Internal Medicine
